# Patient Record
Sex: Female | Born: 1950 | Race: White | Hispanic: No | Marital: Married | Smoking: Never smoker
Health system: Southern US, Community
[De-identification: ages and names within clinical notes are randomized; demographics above are authoritative.]

## PROBLEM LIST (undated history)

## (undated) DIAGNOSIS — F0390 Unspecified dementia without behavioral disturbance: Secondary | ICD-10-CM

## (undated) DIAGNOSIS — I10 Essential (primary) hypertension: Secondary | ICD-10-CM

## (undated) DIAGNOSIS — F32A Depression, unspecified: Secondary | ICD-10-CM

## (undated) DIAGNOSIS — E079 Disorder of thyroid, unspecified: Secondary | ICD-10-CM

## (undated) DIAGNOSIS — F329 Major depressive disorder, single episode, unspecified: Secondary | ICD-10-CM

## (undated) DIAGNOSIS — E119 Type 2 diabetes mellitus without complications: Secondary | ICD-10-CM

## (undated) DIAGNOSIS — E559 Vitamin D deficiency, unspecified: Secondary | ICD-10-CM

## (undated) HISTORY — DX: Type 2 diabetes mellitus without complications: E11.9

## (undated) HISTORY — DX: Vitamin D deficiency, unspecified: E55.9

## (undated) HISTORY — DX: Disorder of thyroid, unspecified: E07.9

---

## 1997-12-11 ENCOUNTER — Ambulatory Visit (HOSPITAL_COMMUNITY): Admission: RE | Admit: 1997-12-11 | Discharge: 1997-12-11 | Payer: Self-pay | Admitting: Obstetrics & Gynecology

## 1999-01-28 ENCOUNTER — Ambulatory Visit (HOSPITAL_COMMUNITY): Admission: RE | Admit: 1999-01-28 | Discharge: 1999-01-28 | Payer: Self-pay | Admitting: Obstetrics & Gynecology

## 1999-01-28 ENCOUNTER — Encounter: Payer: Self-pay | Admitting: Obstetrics & Gynecology

## 1999-03-18 ENCOUNTER — Other Ambulatory Visit: Admission: RE | Admit: 1999-03-18 | Discharge: 1999-03-18 | Payer: Self-pay | Admitting: Obstetrics & Gynecology

## 1999-06-17 ENCOUNTER — Ambulatory Visit (HOSPITAL_COMMUNITY): Admission: RE | Admit: 1999-06-17 | Discharge: 1999-06-17 | Payer: Self-pay | Admitting: Obstetrics & Gynecology

## 1999-12-03 ENCOUNTER — Encounter: Payer: Self-pay | Admitting: Obstetrics & Gynecology

## 1999-12-03 ENCOUNTER — Encounter: Admission: RE | Admit: 1999-12-03 | Discharge: 1999-12-03 | Payer: Self-pay | Admitting: Obstetrics & Gynecology

## 1999-12-20 ENCOUNTER — Other Ambulatory Visit: Admission: RE | Admit: 1999-12-20 | Discharge: 1999-12-20 | Payer: Self-pay | Admitting: Obstetrics & Gynecology

## 2000-05-27 ENCOUNTER — Emergency Department (HOSPITAL_COMMUNITY): Admission: EM | Admit: 2000-05-27 | Discharge: 2000-05-27 | Payer: Self-pay | Admitting: Emergency Medicine

## 2000-05-27 ENCOUNTER — Encounter: Payer: Self-pay | Admitting: Emergency Medicine

## 2000-12-04 ENCOUNTER — Ambulatory Visit (HOSPITAL_COMMUNITY): Admission: RE | Admit: 2000-12-04 | Discharge: 2000-12-04 | Payer: Self-pay | Admitting: Obstetrics & Gynecology

## 2000-12-04 ENCOUNTER — Encounter: Payer: Self-pay | Admitting: Obstetrics & Gynecology

## 2000-12-08 ENCOUNTER — Encounter: Admission: RE | Admit: 2000-12-08 | Discharge: 2000-12-08 | Payer: Self-pay | Admitting: Obstetrics & Gynecology

## 2000-12-08 ENCOUNTER — Encounter: Payer: Self-pay | Admitting: Obstetrics & Gynecology

## 2001-01-23 ENCOUNTER — Other Ambulatory Visit: Admission: RE | Admit: 2001-01-23 | Discharge: 2001-01-23 | Payer: Self-pay | Admitting: Obstetrics & Gynecology

## 2001-03-24 ENCOUNTER — Emergency Department (HOSPITAL_COMMUNITY): Admission: EM | Admit: 2001-03-24 | Discharge: 2001-03-24 | Payer: Self-pay | Admitting: Emergency Medicine

## 2001-03-24 ENCOUNTER — Encounter: Payer: Self-pay | Admitting: Emergency Medicine

## 2002-10-08 ENCOUNTER — Other Ambulatory Visit: Admission: RE | Admit: 2002-10-08 | Discharge: 2002-10-08 | Payer: Self-pay | Admitting: Obstetrics & Gynecology

## 2003-01-20 ENCOUNTER — Ambulatory Visit (HOSPITAL_COMMUNITY): Admission: RE | Admit: 2003-01-20 | Discharge: 2003-01-20 | Payer: Self-pay | Admitting: Obstetrics & Gynecology

## 2003-01-20 ENCOUNTER — Encounter (INDEPENDENT_AMBULATORY_CARE_PROVIDER_SITE_OTHER): Payer: Self-pay | Admitting: Specialist

## 2003-08-14 ENCOUNTER — Ambulatory Visit (HOSPITAL_COMMUNITY): Admission: RE | Admit: 2003-08-14 | Discharge: 2003-08-14 | Payer: Self-pay | Admitting: Internal Medicine

## 2004-01-20 ENCOUNTER — Other Ambulatory Visit: Admission: RE | Admit: 2004-01-20 | Discharge: 2004-01-20 | Payer: Self-pay | Admitting: Obstetrics & Gynecology

## 2004-03-28 ENCOUNTER — Emergency Department (HOSPITAL_COMMUNITY): Admission: EM | Admit: 2004-03-28 | Discharge: 2004-03-28 | Payer: Self-pay | Admitting: Emergency Medicine

## 2004-09-07 ENCOUNTER — Encounter: Payer: Self-pay | Admitting: Gastroenterology

## 2004-12-15 ENCOUNTER — Ambulatory Visit (HOSPITAL_BASED_OUTPATIENT_CLINIC_OR_DEPARTMENT_OTHER): Admission: RE | Admit: 2004-12-15 | Discharge: 2004-12-15 | Payer: Self-pay

## 2004-12-15 ENCOUNTER — Ambulatory Visit (HOSPITAL_COMMUNITY): Admission: RE | Admit: 2004-12-15 | Discharge: 2004-12-15 | Payer: Self-pay

## 2005-05-03 ENCOUNTER — Other Ambulatory Visit: Admission: RE | Admit: 2005-05-03 | Discharge: 2005-05-03 | Payer: Self-pay | Admitting: Obstetrics & Gynecology

## 2006-03-10 ENCOUNTER — Ambulatory Visit: Payer: Self-pay | Admitting: Pulmonary Disease

## 2006-03-13 ENCOUNTER — Ambulatory Visit: Admission: RE | Admit: 2006-03-13 | Discharge: 2006-03-13 | Payer: Self-pay | Admitting: Pulmonary Disease

## 2006-03-13 ENCOUNTER — Encounter: Payer: Self-pay | Admitting: Pulmonary Disease

## 2006-03-21 ENCOUNTER — Ambulatory Visit: Payer: Self-pay | Admitting: Pulmonary Disease

## 2006-04-06 ENCOUNTER — Encounter: Payer: Self-pay | Admitting: Pulmonary Disease

## 2006-04-06 ENCOUNTER — Ambulatory Visit (HOSPITAL_BASED_OUTPATIENT_CLINIC_OR_DEPARTMENT_OTHER): Admission: RE | Admit: 2006-04-06 | Discharge: 2006-04-06 | Payer: Self-pay | Admitting: Pulmonary Disease

## 2006-04-19 ENCOUNTER — Ambulatory Visit: Payer: Self-pay | Admitting: Pulmonary Disease

## 2006-05-02 ENCOUNTER — Ambulatory Visit: Payer: Self-pay | Admitting: Pulmonary Disease

## 2006-10-02 ENCOUNTER — Ambulatory Visit: Payer: Self-pay

## 2007-01-09 ENCOUNTER — Encounter: Payer: Self-pay | Admitting: Gastroenterology

## 2007-06-28 HISTORY — PX: FRACTURE SURGERY: SHX138

## 2007-07-28 ENCOUNTER — Encounter: Payer: Self-pay | Admitting: Pulmonary Disease

## 2007-07-28 DIAGNOSIS — E785 Hyperlipidemia, unspecified: Secondary | ICD-10-CM

## 2007-07-28 DIAGNOSIS — E1169 Type 2 diabetes mellitus with other specified complication: Secondary | ICD-10-CM

## 2007-07-28 DIAGNOSIS — J309 Allergic rhinitis, unspecified: Secondary | ICD-10-CM

## 2007-08-03 ENCOUNTER — Ambulatory Visit: Payer: Self-pay | Admitting: Pulmonary Disease

## 2008-02-23 ENCOUNTER — Emergency Department (HOSPITAL_COMMUNITY): Admission: EM | Admit: 2008-02-23 | Discharge: 2008-02-23 | Payer: Self-pay | Admitting: Emergency Medicine

## 2008-04-14 ENCOUNTER — Encounter: Admission: RE | Admit: 2008-04-14 | Discharge: 2008-04-28 | Payer: Self-pay | Admitting: Internal Medicine

## 2008-04-29 ENCOUNTER — Encounter: Admission: RE | Admit: 2008-04-29 | Discharge: 2008-05-28 | Payer: Self-pay | Admitting: Internal Medicine

## 2008-09-30 ENCOUNTER — Telehealth (INDEPENDENT_AMBULATORY_CARE_PROVIDER_SITE_OTHER): Payer: Self-pay | Admitting: *Deleted

## 2009-02-12 ENCOUNTER — Encounter: Payer: Self-pay | Admitting: Gastroenterology

## 2009-09-12 ENCOUNTER — Emergency Department (HOSPITAL_COMMUNITY): Admission: EM | Admit: 2009-09-12 | Discharge: 2009-09-13 | Payer: Self-pay | Admitting: Emergency Medicine

## 2009-10-28 ENCOUNTER — Ambulatory Visit (HOSPITAL_COMMUNITY): Admission: RE | Admit: 2009-10-28 | Discharge: 2009-10-28 | Payer: Self-pay | Admitting: Internal Medicine

## 2009-12-23 ENCOUNTER — Ambulatory Visit: Payer: Self-pay | Admitting: Psychology

## 2010-11-12 NOTE — Assessment & Plan Note (Signed)
[facility]                               PULMONARY OFFICE NOTE   NAME:[name], Marie [name]                     MRN:          [id number]  DATE:03/10/2006                            DOB:          [birth year]    PULMONARY SELF REFERRAL   HISTORY OF PRESENT ILLNESS:  The patient is a 60 year old, white female, who  comes in today for evaluation of persistent pulmonary complaints.  The  patient states that she has been wheezing in her throat for quite some  time and has had a dry, hacky cough.  It has gotten to the point that she  will choke at times.  The patient denies any significant postnasal drip but  does admit to gastroesophageal reflux symptoms despite being on Prilosec.  She also feels that her food occasionally sticks.  The patient does have  some degree of dyspnea on exertion but it requires a fairly heavy level of  activity.  She admits to dyspnea on exertion at 4 to 5 blocks at a moderate  pace on flat ground.  She has no difficulties making her bed, vacuuming her  house, or bringing groceries in from the car.  The patient has never smoked  and has no history of childhood asthma.  Her last chest x-ray was  approximately 2 years ago.  She does note some mild lower extremity edema.   PAST MEDICAL HISTORY:  Significant for  1. Hypertension.  2. History of diabetes.  3. History of dyslipidemia.  4. History of allergic rhinitis.   CURRENT MEDICATIONS:  Include  1. Cymbalta 60 mg q. day.  2. Metformin ER 500 mg q. day.  3. Atenolol 100 mg q. day.  4. Crestor 40 mg q. day.  5. Seroquel 100 mg q. day.  6. Lorazepam 2 mg q. day.  7. Zetia 10 mg q. day.  8. Glyburide 5 mg q. day.  9. Cyclobenzaprine 10 mg q. day.  10.Prilosec 40 mg q. day.   ALLERGIES:  The patient is allergic to PENICILLIN.   SOCIAL HISTORY:  She is married and has children.  She has never smoked.  She works as a Surveyor, mining.   FAMILY HISTORY:  Remarkable for  asthma, heart disease, and cancer.   REVIEW OF SYSTEMS:  As per history of present illness.  Also see patient  intake form documented on the chart.   PHYSICAL EXAMINATION:  GENERAL:  She is an obese, white female in no acute  distress.  VITAL SIGNS:  Blood pressure is 120/84.  Pulse 99.  Temperature is 97.8.  Weight is 201 pounds.  Oxygen saturation on room air is 96%.  HEENT:  Pupils equal round and reactive to light and accommodation.  Extraocular muscles are intact.  Nares are patent without discharge.  Oropharynx is clear.  NECK:  Supple without JVD or lymphadenopathy.  There is no palpable  thyromegaly.  CHEST:  Totally clear.  CARDIAC:  Regular rate and rhythm.  No murmurs, rubs, or gallops.  ABDOMEN:  Soft, nontender with good bowel sounds.  GENITALIA:  Exam not done, not  indicated.  RECTAL:  Exam not done, not indicated.  BREASTS:  Exam not done, not indicated.  EXTREMITIES:  Lower extremities show trace edema.  Pulses are intact  distally.  NEURO:  Alert and oriented with no obvious motor deficits.   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  There is nothing to suggest  this is from lower airway etiology.  The patient is complaining of some  degree of dysphagia and is having reflux despite being on Prilosec.  At this  point in time,  I would like to do pulmonary function studies to make sure  that she does not have reactive airways disease, but, again, I suspect this  is more upper airway in origin.  We will also increase her proton-pump  inhibitor to see if this will help with her symptomatology.   PLAN:  1. Schedule for full PFTs and chest x-ray.  2. Increase Prilosec to b.i.d. dosing.  3. The patient may need GI evaluation at some point in time if this      continues to be an issue for her.  4. I will call the patient after her PFTs have been done.                                      Barbaraann Share, MD,FCCP   K[facility]/MedQ  DD:  03/14/2006  DT:  03/14/2006  Job #:  [id number]   cc:   Lucky Cowboy, M.D.

## 2010-11-12 NOTE — Op Note (Signed)
NAMEKATHREEN, DILEO NO.:  [id number]   MEDICAL RECORD NO.:  [id number]          PATIENT TYPE:  AMB   LOCATION:  [facility]                         FACILITY:  [facility]   PHYSICIAN:  Lorre Munroe., M.D.DATE OF BIRTH:  1950/08/21   DATE OF PROCEDURE:  12/15/2004  DATE OF DISCHARGE:                                 OPERATIVE REPORT   PREOPERATIVE DIAGNOSES:  Umbilical hernia   POSTOPERATIVE DIAGNOSES:  Umbilical hernia.   OPERATION:  Repair of umbilical hernia.   SURGEON:  Lebron Conners, M.D.   ANESTHESIA:  General and local.   DESCRIPTION OF PROCEDURE:  After the patient was monitored and had general  anesthesia and routine preparation and draping of the abdomen, I made a  short slightly downward curved transverse incision just above the umbilicus.  I immediately encountered bulging fat which separated from the normal  subcutaneous tissues. It was densely adherent to the umbilical skin and I  sharply dissected it away. I dissected the hernia down to the defect which  was about 1.5 cm in circumference and was a typical umbilical hernia. I  separated all of the hernia from the edges of the defect and reduced the  hernia and then sutured the defect closed with 2-0 Prolene suture. I  mobilized the umbilicus and subcutaneous tissues off the fascia for at least  2 cm beyond the repair in all directions. I then fashioned a piece of  polypropylene mesh to overlay the repair and I sewed that in with running  basting 2-0 Prolene suture. I felt that the repair was secure. The sponge,  needle and instrument counts were correct. I anesthetized the tissues with  long-acting local anesthetic. I sewed the umbilical skin down to the center  of the mesh with 4-0 Vicryl and then reduced the subcutaneous space with  some interrupted sutures of 4-0 Vicryl and closed the skin with interrupted  buried intracuticular 4-0 Vicryl and Steri-Strips. I applied a bulky  bandage. She tolerated  the operation well.       WB/MEDQ  D:  12/15/2004  T:  12/15/2004  Job:  [id number]   cc:   Lucky Cowboy, M.D.  [address], [address]  [place], [place] [address]  Fax: [phone]

## 2010-11-12 NOTE — Op Note (Signed)
NAME:  Marie [name], Marie [name]                        ACCOUNT NO.:  [id number]   MEDICAL RECORD NO.:  [id number]                   PATIENT TYPE:  AMB   LOCATION:  [facility]                                  FACILITY:  [facility]   PHYSICIAN:  Freddy Finner, M.D.                DATE OF BIRTH:  05/26/1951   DATE OF PROCEDURE:  01/20/2003  DATE OF DISCHARGE:                                 OPERATIVE REPORT   PREOPERATIVE DIAGNOSES:  1. Probable endometrial polyp.  2. 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   POSTOPERATIVE DIAGNOSES:  1. Probable endometrial polyp.  2. 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   OPERATIVE PROCEDURES:  1. Hysteroscopy.  2. Dilatation and curettage.   ANESTHESIA:  General.   ESTIMATED INTRAOPERATIVE BLOOD LOSS:  10 mL.   INTRAOPERATIVE SORBITOL DEFICIT:  20 mL.   INTRAOPERATIVE COMPLICATIONS:  None.   INDICATIONS:  The patient was seen in the office recently for an episode of  postmenopausal bleeding.  Subsequent to that examination, she had a  sonohysterogram with pelvic ultrasound.  There was a 7 mm lesion  submucosally consistent with a degenerating myoma and a small defect filling  the endometrial cavity.  She is admitted now for hysteroscopy and D&C.  Also  noted on the initial ultrasound and sonohysterogram was a complex cystic  left adnexal mass with partial clearing and followup ultrasound  approximately one week prior to surgery.  This will not be surgically  treated at this time.   DESCRIPTION OF PROCEDURE:  The patient was admitted on the morning of  surgery, brought to the operating room, placed under adequate general  anesthesia, and placed in the dorsolithotomy position using the Kendrick  stirrup system.  Betadine prep was carried out in the usual fashion.  Sterile drapes were applied.  A bivalve speculum and later a posterior wedge  retractor was used to visualize the cervix, which was grasped with a single-  tooth tenaculum and progressively dilated to 23 with Pratt dilators.  The  uterus sounded to 8.5 cm.  The 12.5 degree ACMI hysteroscope was introduced  using 3% Sorbitol distending median and survey of the endometrial cavity  carried out.  There was one small lesion consistent with a small polyp.  The  endometrium appeared to be somewhat heaped up and thickened.  Gentle  thorough curettage was carried out followed by exploration with [name]  stone forceps.  Repeat inspection of the endometrial cavity revealed  clearing of the polyp and adequate sampling of the endometrium.  A blue  somewhat clear cystic lesion was noted just beneath the epithelium on the  lower uterine segment posteriorly.  A photograph was made of this, but was  not further tested.  At this point, the procedure was terminated and the  instruments were removed.  The patient was awaken and taken to recovery in  good condition.  She will be  discharged in the immediate postoperative period for followup in  the office in approximately one week.  She was given routine postoperative  surgical instructions.  She is to use ibuprofen or Tylenol as needed for  pain or Darvocet for more severe postoperative pain.                                               Freddy Finner, M.D.    WRN/MEDQ  D:  01/20/2003  T:  01/20/2003  Job:  [id number]

## 2010-11-12 NOTE — Assessment & Plan Note (Signed)
[facility]                               PULMONARY OFFICE NOTE   NAME:Marie [name], Marie [name]                     MRN:          [id number]  DATE:05/02/2006                            DOB:          1951/05/01    REASON FOR VISIT:  Marie [name] comes in today for followup of her chronic  cough secondary to laryngopharyngeal reflux and also to followup on her  sleep study. Her cough has been doing very well on b.i.d. Prilosec and is  nearly totally resolved.  The patient also had recent nocturnal  polysomnogram and was found to have very minimal obstructive sleep apnea  with respiratory distress and evidence of seven events per hour and oxygen  saturation transiently to 85%.  She was also noted to have 113 leg jerks  with 5 per hour resulting in arousal or awakening.  The patient denies any  symptoms consistent with restless leg syndrome, but does admit to having  knee issues with some chronic pain that probably contributes to her leg  jerks.   PHYSICAL EXAMINATION:  VITAL SIGNS:  Blood pressure 110/68, pulse 94,  temperature 97.7, weight 202 pounds.  Room air saturation is 95%.   IMPRESSION:  1. Cough secondary to laryngopharyngeal reflux.  The patient is much      improved on b.i.d. Prilosec.  I really think she needs to continue on      this and work aggressively on weight loss so that her reflux will get      better and she will be able to come down to one time a day or      completely off the medication.  2. Very minimal obstructive sleep apnea which should respond quite well to      weight loss.   PLAN:  1. Work on weight loss.  2. Stay on the b.i.d. Prilosec until her weight decreases.  3. The patient is to call if her leg jerks become more of an issue or if      she feels like she is unable to lose weight and her sleep continues to      suffer.  4. The patient will followup with me on a p.r.n. basis.    ______________________________  Barbaraann Share, MD,FCCP    [facility]/MedQ  DD: 05/02/2006  DT: 05/02/2006  Job #: [id number]   cc:   Lucky Cowboy, M.D.

## 2010-11-12 NOTE — Procedures (Signed)
NAMESELBY, SLOVACEK NO.:  [id number]   MEDICAL RECORD NO.:  [id number]          PATIENT TYPE:  OUT   LOCATION:  [facility] CENTER                 FACILITY:  [facility]   PHYSICIAN:  Barbaraann Share, MD,FCCPDATE OF BIRTH:  [birth year]   DATE OF STUDY:  04/06/2006                              NOCTURNAL POLYSOMNOGRAM    REFERRING PHYSICIAN:  Dr. Marcelyn Bruins   INDICATION FOR STUDY:  Hypersomnolence, sleep apnea.   EPWORTH SLEEPINESS SCORE:  5   SLEEP ARCHITECTURE:  The patient had a total sleep time of 365 minutes with  decreased REM and slow-wave sleep.  Sleep onset latency was normal and REM  onset was prolonged.  Sleep efficiency was decreased at 83%.   RESPIRATORY DATA:  The patient was found to have 31 hypopneas and 14  obstructive apneas for a respiratory disturbance index of 7 events per hour.  The events were not positional and his O2 desaturation was as low as 85%.  There was moderate snoring noted throughout the study.   OXYGEN DATA:  The patient had O2 desaturation as low as 85% with the  obstructive events, as stated above.   CARDIAC DATA:  No clinically significant arrhythmia was noted.   MOVEMENT-PARASOMNIA:  The patient was found to have 113 leg jerks with 5 per  hour, resulting in arousal or awakening.   IMPRESSIONS-RECOMMENDATIONS:  1. Very mild obstructive sleep apnea/hypopnea syndrome with a respiratory      disturbance index of 7 events per hour and O2 desaturation as low as      85%.  Treatment for this dubious sleep apnea can include weight loss      alone if applicable, upper airway surgery, oral appliance, and also      CPAP.  2. Large numbers of leg jerks with what appears to be significant sleep      disruption.  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  Clinical      correlation is suggested.           ______________________________  Barbaraann Share,  MD,FCCP  Diplomate, American Board of Sleep  Medicine    [facility]/MEDQ  D:  04/20/2006 15:49:13  T:  04/21/2006 19:12:52  Job:  [id number]

## 2010-11-12 NOTE — Consult Note (Signed)
[facility]  Patient:    Marie [name], Marie [name] Visit Number: [id number] MRN: [id number]          Service Type: Attending:  Earlyne Iba, M.D. Dictated by:   Georgena Spurling, M.D. Proc. Date: 03/24/01 Consultation Date: 03/24/01                            Consultation Report  ER CONSULT  ADMISSION DIAGNOSES:  Right humerus fracture.  CONSULTING PHYSICIAN:  Earlyne Iba, M.D.  HISTORY OF PRESENT ILLNESS:  The patient is a 60 year old white female who fell off a horse approximately four hours ago.  She complained of right upper extremity pain, and was brought to the emergency room by her family and friends.  X-rays reveal a humerus fracture and I was consulted.  ALLERGIES:  PENICILLIN.  No other medications.  REVIEW OF SYSTEMS:  Otherwise negative.  PHYSICAL EXAMINATION:  VITAL SIGNS:  She is afebrile.  Vital signs are stable.  GENERAL:  Well-nourished, well-developed, in no distress.  She is somewhat sedated from morphine, given to her upon arrival.  SKIN:  Intact.  NEUROVASCULAR:  Grossly intact.  Her radial nerve does appear to be intact. She has excellent pulses.  X-RAYS:  Show approximately mid-shaft humerus fracture, with possibly 60 degrees of angulation in the anteroposterior plane; virtually none in the mediolateral plane.  IMPRESSION:  Humeral shaft fracture, closed.  TREATMENT:  Closed reduction in the emergency room, and coaptation splint application.  She was given prescription for Percocet, instructions for ice and elevation.  Follow-up in four days, at which time we will move to a SOMI ______ brace. Dictated by:   Georgena Spurling, M.D. Attending:  Earlyne Iba, M.D. DD:  03/24/01 TD:  03/24/01 Job: [id number]

## 2010-12-03 ENCOUNTER — Other Ambulatory Visit (HOSPITAL_COMMUNITY): Payer: Self-pay | Admitting: Orthopedic Surgery

## 2010-12-03 ENCOUNTER — Other Ambulatory Visit: Payer: Self-pay | Admitting: Orthopedic Surgery

## 2010-12-03 ENCOUNTER — Encounter (HOSPITAL_COMMUNITY): Payer: BC Managed Care – PPO

## 2010-12-03 ENCOUNTER — Ambulatory Visit (HOSPITAL_COMMUNITY)
Admission: RE | Admit: 2010-12-03 | Discharge: 2010-12-03 | Disposition: A | Discharge status: Home / Self Care | Payer: BC Managed Care – PPO | Source: Ambulatory Visit | Attending: Orthopedic Surgery | Admitting: Orthopedic Surgery

## 2010-12-03 DIAGNOSIS — Z01811 Encounter for preprocedural respiratory examination: Secondary | ICD-10-CM

## 2010-12-03 DIAGNOSIS — Z01812 Encounter for preprocedural laboratory examination: Secondary | ICD-10-CM | POA: Insufficient documentation

## 2010-12-03 DIAGNOSIS — Z01818 Encounter for other preprocedural examination: Secondary | ICD-10-CM | POA: Insufficient documentation

## 2010-12-03 LAB — URINALYSIS, ROUTINE W REFLEX MICROSCOPIC
Bilirubin Urine: NEGATIVE
Glucose, UA: NEGATIVE mg/dL
Hgb urine dipstick: NEGATIVE
Ketones, ur: NEGATIVE mg/dL
Nitrite: NEGATIVE
Protein, ur: NEGATIVE mg/dL
Specific Gravity, Urine: 1.02 (ref 1.005–1.030)
Urobilinogen, UA: 1 mg/dL (ref 0.0–1.0)
pH: 6.5 (ref 5.0–8.0)

## 2010-12-03 LAB — URINE MICROSCOPIC-ADD ON

## 2010-12-03 LAB — COMPREHENSIVE METABOLIC PANEL
ALT: 18 U/L (ref 0–35)
AST: 23 U/L (ref 0–37)
Albumin: 3.9 g/dL (ref 3.5–5.2)
Alkaline Phosphatase: 51 U/L (ref 39–117)
BUN: 7 mg/dL (ref 6–23)
CO2: 28 mEq/L (ref 19–32)
Calcium: 9.4 mg/dL (ref 8.4–10.5)
Chloride: 99 mEq/L (ref 96–112)
Creatinine, Ser: 0.82 mg/dL (ref 0.4–1.2)
GFR calc Af Amer: >60 mL/min (ref >60)
GFR calc non Af Amer: >60 mL/min (ref >60)
Glucose, Bld: 84 mg/dL (ref 70–99)
Potassium: 3.6 mEq/L (ref 3.5–5.1)
Sodium: 135 mEq/L (ref 135–145)
Total Bilirubin: 0.3 mg/dL (ref 0.3–1.2)
Total Protein: 6.5 g/dL (ref 6.0–8.3)

## 2010-12-03 LAB — DIFFERENTIAL
Basophils Absolute: 0.1 10*3/uL (ref 0.0–0.1)
Basophils Relative: 1 % (ref 0–1)
Eosinophils Absolute: 0.2 10*3/uL (ref 0.0–0.7)
Eosinophils Relative: 3 % (ref 0–5)
Lymphocytes Relative: 26 % (ref 12–46)
Lymphs Abs: 1.6 10*3/uL (ref 0.7–4.0)
Monocytes Absolute: 0.5 10*3/uL (ref 0.1–1.0)
Monocytes Relative: 8 % (ref 3–12)
Neutro Abs: 3.7 10*3/uL (ref 1.7–7.7)
Neutrophils Relative %: 62 % (ref 43–77)

## 2010-12-03 LAB — CBC
HCT: 40.4 % (ref 36.0–46.0)
Hemoglobin: 13.3 g/dL (ref 12.0–15.0)
MCH: 29.4 pg (ref 26.0–34.0)
MCHC: 32.9 g/dL (ref 30.0–36.0)
MCV: 89.2 fL (ref 78.0–100.0)
Platelets: 304 10*3/uL (ref 150–400)
RBC: 4.53 MIL/uL (ref 3.87–5.11)
RDW: 12.9 % (ref 11.5–15.5)
WBC: 5.9 10*3/uL (ref 4.0–10.5)

## 2010-12-03 LAB — SURGICAL PCR SCREEN
MRSA, PCR: NEGATIVE
Staphylococcus aureus: NEGATIVE

## 2010-12-03 LAB — PROTIME-INR
INR: 0.93 (ref 0.00–1.49)
Prothrombin Time: 12.7 seconds (ref 11.6–15.2)

## 2010-12-03 LAB — APTT: aPTT: 30 seconds (ref 24–37)

## 2010-12-03 LAB — ABO/RH: ABO/RH(D): B POS

## 2010-12-09 DIAGNOSIS — E119 Type 2 diabetes mellitus without complications: Secondary | ICD-10-CM | POA: Diagnosis present

## 2010-12-09 DIAGNOSIS — Z01812 Encounter for preprocedural laboratory examination: Secondary | ICD-10-CM

## 2010-12-09 DIAGNOSIS — I10 Essential (primary) hypertension: Secondary | ICD-10-CM | POA: Diagnosis present

## 2010-12-09 DIAGNOSIS — M1710 Unilateral primary osteoarthritis, unspecified knee: Secondary | ICD-10-CM | POA: Diagnosis present

## 2010-12-09 DIAGNOSIS — Z96659 Presence of unspecified artificial knee joint: Secondary | ICD-10-CM

## 2010-12-09 DIAGNOSIS — T84099A Other mechanical complication of unspecified internal joint prosthesis, initial encounter: Principal | ICD-10-CM | POA: Diagnosis present

## 2010-12-09 DIAGNOSIS — D62 Acute posthemorrhagic anemia: Secondary | ICD-10-CM | POA: Diagnosis not present

## 2010-12-09 DIAGNOSIS — Y831 Surgical operation with implant of artificial internal device as the cause of abnormal reaction of the patient, or of later complication, without mention of misadventure at the time of the procedure: Secondary | ICD-10-CM | POA: Diagnosis present

## 2010-12-09 LAB — GLUCOSE, CAPILLARY
Glucose-Capillary: 131 mg/dL — ABNORMAL HIGH (ref 70–99)
Glucose-Capillary: 177 mg/dL — ABNORMAL HIGH (ref 70–99)
Glucose-Capillary: 162 mg/dL — ABNORMAL HIGH (ref 70–99)
Glucose-Capillary: 78 mg/dL (ref 70–99)

## 2010-12-09 LAB — HEMOGLOBIN A1C
Hgb A1c MFr Bld: 6 % — ABNORMAL HIGH (ref <5.7)
Mean Plasma Glucose: 126 mg/dL — ABNORMAL HIGH (ref <117)

## 2010-12-10 LAB — CBC
HCT: 30.3 % — ABNORMAL LOW (ref 36.0–46.0)
Hemoglobin: 10.3 g/dL — ABNORMAL LOW (ref 12.0–15.0)
MCH: 30.1 pg (ref 26.0–34.0)
MCHC: 34 g/dL (ref 30.0–36.0)
MCV: 88.6 fL (ref 78.0–100.0)
Platelets: 220 10*3/uL (ref 150–400)
RBC: 3.42 MIL/uL — ABNORMAL LOW (ref 3.87–5.11)
RDW: 13.1 % (ref 11.5–15.5)
WBC: 7.1 10*3/uL (ref 4.0–10.5)

## 2010-12-10 LAB — GLUCOSE, CAPILLARY
Glucose-Capillary: 139 mg/dL — ABNORMAL HIGH (ref 70–99)
Glucose-Capillary: 105 mg/dL — ABNORMAL HIGH (ref 70–99)
Glucose-Capillary: 121 mg/dL — ABNORMAL HIGH (ref 70–99)

## 2010-12-10 LAB — BASIC METABOLIC PANEL
BUN: 9 mg/dL (ref 6–23)
CO2: 28 mEq/L (ref 19–32)
Calcium: 8.3 mg/dL — ABNORMAL LOW (ref 8.4–10.5)
Chloride: 101 mEq/L (ref 96–112)
Creatinine, Ser: 0.82 mg/dL (ref 0.50–1.10)
GFR calc Af Amer: >60 mL/min (ref >60)
GFR calc non Af Amer: >60 mL/min (ref >60)
Glucose, Bld: 123 mg/dL — ABNORMAL HIGH (ref 70–99)
Potassium: 3.5 mEq/L (ref 3.5–5.1)
Sodium: 136 mEq/L (ref 135–145)

## 2010-12-11 LAB — BASIC METABOLIC PANEL
BUN: 9 mg/dL (ref 6–23)
Calcium: 8.8 mg/dL (ref 8.4–10.5)
Creatinine, Ser: 0.84 mg/dL (ref 0.50–1.10)
GFR calc Af Amer: >60 mL/min (ref >60)
GFR calc non Af Amer: >60 mL/min (ref >60)
Glucose, Bld: 111 mg/dL — ABNORMAL HIGH (ref 70–99)
Potassium: 3.9 mEq/L (ref 3.5–5.1)

## 2010-12-11 LAB — CBC
HCT: 28.3 % — ABNORMAL LOW (ref 36.0–46.0)
Hemoglobin: 9.5 g/dL — ABNORMAL LOW (ref 12.0–15.0)
MCH: 29.9 pg (ref 26.0–34.0)
MCHC: 33.6 g/dL (ref 30.0–36.0)
MCV: 89 fL (ref 78.0–100.0)
RDW: 13.2 % (ref 11.5–15.5)

## 2010-12-11 LAB — GLUCOSE, CAPILLARY: Glucose-Capillary: 134 mg/dL — ABNORMAL HIGH (ref 70–99)

## 2010-12-12 LAB — GLUCOSE, CAPILLARY

## 2010-12-12 LAB — CBC
HCT: 30.1 % — ABNORMAL LOW (ref 36.0–46.0)
RBC: 3.39 MIL/uL — ABNORMAL LOW (ref 3.87–5.11)
RDW: 13.1 % (ref 11.5–15.5)
WBC: 6.2 10*3/uL (ref 4.0–10.5)

## 2010-12-13 LAB — CROSSMATCH
ABO/RH(D): B POS
Antibody Screen: NEGATIVE
Unit division: 0

## 2011-01-12 NOTE — Discharge Summary (Signed)
NAMEDEJAE, BERNET NO.:  [id number]  MEDICAL RECORD NO.:  [id number]  LOCATION:  1619                         FACILITY:  [facility]  PHYSICIAN:  Lithzy Bernard L. Rendall, M.D.  DATE OF BIRTH:  [birth year]  DATE OF ADMISSION:  12/09/2010 DATE OF DISCHARGE:  12/12/2010                              DISCHARGE SUMMARY   ADMISSION DIAGNOSES: 1. Painful left unicompartmental knee arthroplasty. 2. Hypertension. 3. Type 2 diabetes mellitus. 4. Hypercholesterolemia. 5. Anxiety/depression. 6. Hypothyroidism.  DISCHARGE DIAGNOSES: 1. Painful left uni-knee arthroplasty now status post left total knee     arthroplasty. 2. Acute blood loss anemia secondary to surgery. 3. Constipation. 4. Hypertension. 5. Type 2 diabetes mellitus. 6. Hypercholesterolemia. 7. Anxiety/depression. 8. Hypothyroidism.  SURGICAL PROCEDURES:  On December 09, 2010, Ms. Klett underwent an excision of her left unicompartmental knee and this was subsequently revised to a total knee arthroplasty with computer navigation by Dr. Jonny Ruiz L. Rendall assisted by Arnoldo Morale PA-C.  She had a DePuy primary femoral component cemented size medium left placed with an LCS complete metal back patella cemented size medium.  A tibial tray rotating platform MBT revision size 2.5 cemented and an LCS complete tibial insert rotating platform 17.5-mm thickness medium.  COMPLICATIONS:  None.  CONSULTANT:  Physical Therapy consult on December 10, 2010.  HISTORY OF PRESENT ILLNESS:  This 60 year old white female patient presented to Dr. Priscille Kluver with history of a left unicompartmental knee in July of 1995 and now a several-month history of progressive left knee deformity and pain.  Her left knee pain is an intermittent throb over the anterolateral joint line without radiation.  It increases with increased activity and decreases with rest.  There are no mechanical symptoms of the knee but x-rays show loosening of the prosthesis  and because of the pain and progressive deformity, she is presenting for a left knee revision.  HOSPITAL COURSE:  Ms. Avalos tolerated her surgical procedure well without immediate postoperative complications.  She was transferred to the orthopedic floor.  Postop day #1, she was afebrile, vitals were stable.  Hemoglobin 10.3, hematocrit 30.3, hemoglobin A1c was 6%. Hemovac was DC'ed.  Dressing was intact with minimal drainage.  She was weaned off her oxygen.  PCA and Foley were DC'ed.  She was started on therapy per protocol.  Postop day #2, she complained of continued pain in the knee but remained afebrile.  Labs were stable.  She made slow progress with therapy that day and plans were made for DC home for the next day.  At that time, her pain was well controlled with meds.  She  again remained afebrile.  She was moving well with therapy and it was felt she was ready for DC home.  DISCHARGE INSTRUCTIONS:  DIET:  She is to resume her regular diabetic diet.  MEDICATIONS:  Please see the patient home med discharge instruction sheet for complete documentation of her medications.  We did continue all of her home meds but we did add Celebrex, Robaxin, Norco and Xarelto.  ACTIVITY:  She can be out of bed weightbearing as tolerated on the left leg with use of walker.  No lifting or driving for 6 weeks.  Please see the white total joint discharge sheet for further activity instructions.  WOUND CARE:  Please see the white total joint discharge sheet for further wound care instructions.  FOLLOW-UP:  She needs to follow up with Dr. Priscille Kluver in our office on Thursday December 23, 2010, and needs to call [phone] for that appointment. She is arranged for [facility] and CPM by [employer].  LABORATORY DATA:  Hemoglobin and hematocrit ranged from 10.3 and 30.3 on the 15th to 9.5 and 28.3 on the 16th to 10.2 and 30.1 on the 17th. White count and platelets were within normal  limits.  Glucose ranged from 123 on the 15th to 111 on the 16th.  Calcium ranged from 8.3 on the 15th to 8.8 on the 16th.  Hemoglobin A1c was 6%.  All other laboratory studies were within normal limits.     Legrand Pitts Duffy, P.A.   ______________________________ Carlisle Beers. Priscille Kluver, M.D.    KED/MEDQ  D:  01/05/2011  T:  01/05/2011  Job:  [id number]  Electronically Signed by Arnoldo Morale P.A. on 01/06/2011 09:39:29 AM Electronically Signed by Erasmo Leventhal M.D. on 01/12/2011 09:59:44 AM

## 2011-01-12 NOTE — Op Note (Signed)
NAMEENDIA, MONCUR NO.:  [id number]  MEDICAL RECORD NO.:  [id number]  LOCATION:  0004                         FACILITY:  [facility]  PHYSICIAN:  Larae Caison L. Rendall, M.D.  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  SURGICAL PROCEDURES:  Removal of unicompartmental total knee and revision total knee with computer navigation assistance.  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  SURGEON:  Lyndal Reggio L. Rendall, M.D.  ASSISTANT:  Legrand Pitts. Duffy, P.A. present and participating in entire procedure.  PATHOLOGY:  The patient had a unicompartmental total knee put in 1995. She has a lateral parapatellar incision and it has become symptomatically loose in the last 1 to 2 years.  Radiographically it looks like the tibia may be loose but at the time of surgery femoral component wiggled when tapped.  The tibial component was more solid but displaced readily with pressure at the anterior inferior edge.  PROCEDURE:  Under general anesthesia with femoral nerve block, the left leg was prepared with DuraPrep and draped as a sterile field with a sterile tourniquet proximally.  Leg was elevated and then tourniquet used at 350 mmHg.  The lateral parapatellar incision was carefully marked.  It is totally usable for this prosthesis today and the decision was made to go approximately 2 inches medial to this 59 year old incision with absolutely no undermining of the subcutaneous tissue and skin.  With that in mind, a medial parapatellar incision was made at least 2 inches medial to the old incision.  The patella was everted. There was foreign body reaction to the failed total knee and a synovectomy was carried out.  The knee was then flexed.  The femoral component was found to be loose and  was tapped out of the joint with small osteotome.  The front of the tibial tray laterally is exposed and it is tapped out and removed.  At this point debridement is done in preparation for computer mapping.  Schanz pins were then placed in the superior medial tibia and distal femur.  The arrays were set up, femoral head and malleoli are identified.  Proximal tibia and distal femur were then mapped.  Extension was then mapped.  The knee was in 10 degrees flexion and 7 degrees valgus at the start of the procedure.  Once the mapping was complete, the tibial guide was used for proximal tibial resection.  Decision was made to take it 1 mm below the lateral uni and take approximately 10.5 mm off the high side medially.  With this plan in place, proximal tibial resection was carried out.  The balancer was then used and ligament balancing was done within 2 degrees.  It is 2 degrees valgus at this point.  The flexion gap was then measured.  The femur was then sized at a medium.  Anterior and posterior flares of the distal femur were resected.  Once this was  done, the distal femoral cut was made.  Flexion and extension gaps roughly seemed to balance at 12.5. The  lamina spreader was then inserted and remnants of the cruciates and menisci were resected.  Considerable debris was taken out of the lateral recess posteriorly.  Once this was done, the recessing guide was used on the femur and with this completed attention was turned to the tibia. Mchale and Hohmann were used for exposure.  Debris was removed around the lateral a edge.  Once this was completed, center peg hole with keel were inserted for the MBT bearing and tray.  Decision was made to go with the longer stem in the tibia due to this being a revision situation.  Once this was done, trial seating of a 2.5 MBT tibia, 12.5 bearing and medium femur reveals alignment within 3 to 4 degrees of valgus but some hyperextension stepping it up  progressively to a 17.5 bearing seemed to optimize  results with alignment within 2 degrees of anatomic being 2 degrees valgus and the knee would then still flex easily to approximately 120 degrees.  There was still some slight laxity of the knee and flexion laterally but in extension.  It was very solid. This was felt to be all within acceptable range and permanent components were obtained.  Several bony surfaces that were very sclerotic were drilled with fins and several cysts were taken out of the tibia and femur, and once this was all completed, permanent components were cemented in place.  Excellent fit, alignment, and stability were obtained.  Tourniquet was let down at 1 hour and 15 minutes roughly. Multiple small vessels were cauterized after excess cement was removed and a medium Hemovac drain inserted.  The knee was then closed in layers with #1 Tycron, #1 Vicryl, 2-0 Vicryl and skin clips.  The patient returned to recovery in good condition.  The lateral skin edge of the incision did seem to have good bleeding in the subcutaneous fat and the skin appear viable.     Daril Warga L. Priscille Kluver, M.D.     Renato Gails  D:  12/09/2010  T:  12/09/2010  Job:  [id number]  Electronically Signed by Erasmo Leventhal M.D. on 01/12/2011 09:59:38 AM

## 2011-04-18 ENCOUNTER — Other Ambulatory Visit: Payer: Self-pay | Admitting: Internal Medicine

## 2011-04-18 DIAGNOSIS — R413 Other amnesia: Secondary | ICD-10-CM

## 2011-04-21 ENCOUNTER — Ambulatory Visit
Admission: RE | Admit: 2011-04-21 | Discharge: 2011-04-21 | Disposition: A | Discharge status: Home / Self Care | Payer: BC Managed Care – PPO | Source: Ambulatory Visit | Attending: Internal Medicine | Admitting: Internal Medicine

## 2011-04-21 DIAGNOSIS — R413 Other amnesia: Secondary | ICD-10-CM

## 2011-04-21 MED ORDER — GADOBENATE DIMEGLUMINE 529 MG/ML IV SOLN
14.0000 mL | Freq: Once | INTRAVENOUS | Status: AC | PRN
  Administered 2011-04-21: 14 mL via INTRAVENOUS

## 2011-05-09 ENCOUNTER — Ambulatory Visit: Payer: BC Managed Care – PPO

## 2011-11-26 HISTORY — PX: TOTAL KNEE ARTHROPLASTY: SHX125

## 2012-01-26 ENCOUNTER — Encounter (HOSPITAL_COMMUNITY): Payer: Self-pay | Admitting: *Deleted

## 2012-01-26 ENCOUNTER — Emergency Department (HOSPITAL_COMMUNITY): Payer: BC Managed Care – PPO

## 2012-01-26 ENCOUNTER — Emergency Department (HOSPITAL_COMMUNITY)
Admission: EM | Admit: 2012-01-26 | Discharge: 2012-01-26 | Disposition: A | Discharge status: Home / Self Care | Payer: BC Managed Care – PPO | Attending: Emergency Medicine | Admitting: Emergency Medicine

## 2012-01-26 DIAGNOSIS — T50901A Poisoning by unspecified drugs, medicaments and biological substances, accidental (unintentional), initial encounter: Secondary | ICD-10-CM | POA: Insufficient documentation

## 2012-01-26 DIAGNOSIS — I10 Essential (primary) hypertension: Secondary | ICD-10-CM | POA: Insufficient documentation

## 2012-01-26 DIAGNOSIS — R4182 Altered mental status, unspecified: Secondary | ICD-10-CM | POA: Insufficient documentation

## 2012-01-26 DIAGNOSIS — E119 Type 2 diabetes mellitus without complications: Secondary | ICD-10-CM | POA: Insufficient documentation

## 2012-01-26 DIAGNOSIS — Z79899 Other long term (current) drug therapy: Secondary | ICD-10-CM | POA: Insufficient documentation

## 2012-01-26 DIAGNOSIS — R000 Tachycardia, unspecified: Secondary | ICD-10-CM | POA: Insufficient documentation

## 2012-01-26 DIAGNOSIS — R404 Transient alteration of awareness: Secondary | ICD-10-CM | POA: Insufficient documentation

## 2012-01-26 HISTORY — DX: Major depressive disorder, single episode, unspecified: F32.9

## 2012-01-26 HISTORY — DX: Unspecified dementia, unspecified severity, without behavioral disturbance, psychotic disturbance, mood disturbance, and anxiety: F03.90

## 2012-01-26 HISTORY — DX: Depression, unspecified: F32.A

## 2012-01-26 HISTORY — DX: Essential (primary) hypertension: I10

## 2012-01-26 LAB — URINALYSIS, ROUTINE W REFLEX MICROSCOPIC
Glucose, UA: NEGATIVE mg/dL
Hgb urine dipstick: NEGATIVE
Protein, ur: NEGATIVE mg/dL
Specific Gravity, Urine: 1.016 (ref 1.005–1.030)

## 2012-01-26 LAB — CBC
MCH: 30.7 pg (ref 26.0–34.0)
Platelets: 243 10*3/uL (ref 150–400)
RBC: 3.88 MIL/uL (ref 3.87–5.11)
WBC: 6.1 10*3/uL (ref 4.0–10.5)

## 2012-01-26 LAB — COMPREHENSIVE METABOLIC PANEL
ALT: 13 U/L (ref 0–35)
AST: 17 U/L (ref 0–37)
CO2: 23 mEq/L (ref 19–32)
Calcium: 9.2 mg/dL (ref 8.4–10.5)
GFR calc non Af Amer: >90 mL/min (ref >90)
Sodium: 138 mEq/L (ref 135–145)

## 2012-01-26 LAB — GLUCOSE, CAPILLARY: Glucose-Capillary: 132 mg/dL — ABNORMAL HIGH (ref 70–99)

## 2012-01-26 LAB — RAPID URINE DRUG SCREEN, HOSP PERFORMED: Barbiturates: NOT DETECTED

## 2012-01-26 LAB — POCT I-STAT TROPONIN I

## 2012-01-26 MED ORDER — SODIUM CHLORIDE 0.9 % IV BOLUS (SEPSIS)
1000.0000 mL | Freq: Once | INTRAVENOUS | Status: AC
Start: 2012-01-26 — End: 2012-01-26
  Administered 2012-01-26: 1000 mL via INTRAVENOUS

## 2012-01-26 NOTE — ED Notes (Signed)
Per pt's family pt has had decreased loc and drowsy thought the day. Pt's was unable to find her daughters house. Pt is unable to recall family members. Pt will respond to verbal. Family is unsure of pt's loc. Family states they are unsure if she took to much medication. Pt's daughter states she is 8 over the amount of xanax and 4 over more of quetiapine

## 2012-01-26 NOTE — ED Provider Notes (Signed)
History     CSN: [id number]  Arrival date & time 01/26/12  1635   First MD Initiated Contact with Patient 01/26/12 1651      Chief Complaint  Patient presents with  . Altered Mental Status    (Consider location/radiation/quality/duration/timing/severity/associated sxs/prior treatment) HPI Comments: Level 5 caveat for dementia/AMS.  Pt comes in with cc of altered mental status. Called nursing home, and the incoming staff wasn't sure why exactly patient was sent to the ED, and was unable to give a good idea on baseline status. Spoke with family, and the daughter on the phone stated that patient has had change in mentation, mostly somnolence, due to UTI in the past. Pt is aox1, and has no complains. ROS is negative for chest pain, sob, cough, n/v/abd pain/uti like sx.   The history is provided by the patient and the nursing home. The history is limited by the condition of the patient.    Past Medical History  Diagnosis Date  . Hypertension   . Depression   . Diabetes mellitus   . Dementia     History reviewed. No pertinent past surgical history.  No family history on file.  History  Substance Use Topics  . Smoking status: Never Smoker   . Smokeless tobacco: Not on file  . Alcohol Use: No    OB History    Grav Para Term Preterm Abortions TAB SAB Ect Mult Living                  Review of Systems  Constitutional: Positive for activity change.  HENT: Negative for neck pain.   Respiratory: Negative for cough and shortness of breath.   Cardiovascular: Negative for chest pain.  Gastrointestinal: Negative for nausea, vomiting and abdominal pain.  Genitourinary: Negative for dysuria, frequency and hematuria.  Neurological: Negative for headaches.    Allergies  Penicillins  Home Medications   Current Outpatient Rx  Name Route Sig Dispense Refill  . ALPRAZOLAM 1 MG PO TABS Oral Take 1 mg by mouth 3 (three) times daily.    . AZITHROMYCIN 250 MG PO TABS Oral Take  250-500 mg by mouth daily. Take 2 tablets on day 1 and 1 tablet days 2 through 5.    . CITALOPRAM HYDROBROMIDE 40 MG PO TABS Oral Take 40 mg by mouth daily.    . DULOXETINE HCL 60 MG PO CPEP Oral Take 60 mg by mouth daily.    Marland Kitchen LEVOTHYROXINE SODIUM 50 MCG PO TABS Oral Take 50 mcg by mouth daily.    Marland Kitchen METFORMIN HCL ER 500 MG PO TB24 Oral Take 1,000 mg by mouth 2 (two) times daily.    . QUETIAPINE FUMARATE 300 MG PO TABS Oral Take 300 mg by mouth at bedtime.    Marland Kitchen ROPINIROLE HCL 1 MG PO TABS Oral Take 1 mg by mouth daily.    Marland Kitchen ROSUVASTATIN CALCIUM 20 MG PO TABS Oral Take 20 mg by mouth daily.    . TRAZODONE HCL 150 MG PO TABS Oral Take 150-300 mg by mouth at bedtime as needed. For sleep      BP 129/95  Pulse 103  Temp 98.2 F (36.8 C) (Oral)  Resp 16  SpO2 99%  Physical Exam  Constitutional: She is oriented to person, place, and time. She appears well-developed and well-nourished.  HENT:  Head: Normocephalic and atraumatic.  Eyes: EOM are normal. Pupils are equal, round, and reactive to light.  Neck: Neck supple.  Cardiovascular: Normal rate, regular rhythm  and normal heart sounds.   No murmur heard. Pulmonary/Chest: Effort normal. No respiratory distress.  Abdominal: Soft. She exhibits no distension. There is no tenderness. There is no rebound and no guarding.  Neurological: She is alert and oriented to person, place, and time.  Skin: Skin is warm and dry.    ED Course  Procedures (including critical care time)  Labs Reviewed  CBC - Abnormal; Notable for the following:    Hemoglobin 11.9 (*)     HCT 34.0 (*)     All other components within normal limits  COMPREHENSIVE METABOLIC PANEL - Abnormal; Notable for the following:    Glucose, Bld 173 (*)     Albumin 3.4 (*)     All other components within normal limits  URINE RAPID DRUG SCREEN (HOSP PERFORMED) - Abnormal; Notable for the following:    Benzodiazepines POSITIVE (*)     All other components within normal limits    GLUCOSE, CAPILLARY - Abnormal; Notable for the following:    Glucose-Capillary 132 (*)     All other components within normal limits  AMMONIA  URINALYSIS, ROUTINE W REFLEX MICROSCOPIC  URINE CULTURE   Dg Chest 2 View  01/26/2012  *RADIOLOGY REPORT*  Clinical Data: Altered mental status.  CHEST - 2 VIEW  Comparison: 12/03/2010  Findings: Mild peribronchial thickening.  Heart is borderline in size.  Bibasilar linear densities, similar to prior study, likely scarring.  No confluent opacities otherwise.  Apparent trace bilateral effusions on the lateral view.  IMPRESSION: Bronchitic changes.  Suspect trace bilateral effusions.  Original Report Authenticated By: Cyndie Chime, M.D.   Ct Head Wo Contrast  01/26/2012  *RADIOLOGY REPORT*  Clinical Data: Altered mental status, drowsiness  CT HEAD WITHOUT CONTRAST  Technique:  Contiguous axial images were obtained from the base of the skull through the vertex without contrast.  Comparison: 04/21/2011  Findings: The bony calvarium is intact and no gross soft tissue abnormality is identified.  Paranasal sinuses are within normal limits as visualized.  Mild atrophic changes are seen.  No findings to suggest acute hemorrhage, acute infarction or space-occupying mass lesion are noted.  IMPRESSION: No acute intracranial abnormality is noted.  Original Report Authenticated By: Phillips Odor, M.D.     No diagnosis found.    MDM  DDx: Sepsis syndrome ACS syndrome DKA ICH Stroke CHF exacerbation COPD exacerbation Infection - pneumonia/UTI/Cellulitis PE Dehydration Electrolyte abnormality Tox syndrome  Pt comes in with no SIRs criteria, but AMS. Pt has hx of UTI which has led to sepsis before. UA currently shows evidence of infection. All the other labs are largely WNL. Given pt is a nursing home patient, she needs to be treated as a complicated cystitis. Spoke with hospitalist, who believes patient would be better served by going back to the  nursing home as the vitals and labs are largely unremarkable. I spoke at length with the nursing home about the dx in the ED and d/c instructions.         Derwood Kaplan, MD 01/31/12 [phone]

## 2012-01-26 NOTE — ED Provider Notes (Signed)
History     CSN: [id number]  Arrival date & time 01/26/12  1635   First MD Initiated Contact with Patient 01/26/12 1651      Chief Complaint  Patient presents with  . Altered Mental Status    (Consider location/radiation/quality/duration/timing/severity/associated sxs/prior treatment) Patient is a 61 y.o. female presenting with altered mental status. The history is provided by the patient and a relative.  Altered Mental Status This is a new problem.   patient with mild baseline dementia presents to the emergency department which chief complaint as reported by family of altered mental status. They relate that this morning, her speech was "off the wall" and she was taken to her primary Dr. who advised her family to keep a close eye on her. As time went on, she became drowsy and her speech became somewhat slurred. Her family been checked her medications and realized that she may have taken extra Xanax and/or Seroquel. She has not noted to them any physical complaints. She has not had a known fever or cough. There's been no nausea or vomiting. No modifying factors.  Past Medical History  Diagnosis Date  . Hypertension   . Depression   . Diabetes mellitus   . Dementia     History reviewed. No pertinent past surgical history.  No family history on file.  History  Substance Use Topics  . Smoking status: Never Smoker   . Smokeless tobacco: Not on file  . Alcohol Use: No     Review of Systems  Unable to perform ROS Psychiatric/Behavioral: Positive for altered mental status.  Unable to perform due to AMS and dementia.  Allergies  Penicillins  Home Medications   Current Outpatient Rx  Name Route Sig Dispense Refill  . ALPRAZOLAM 1 MG PO TABS Oral Take 1 mg by mouth 3 (three) times daily.    . AZITHROMYCIN 250 MG PO TABS Oral Take 250-500 mg by mouth daily. Take 2 tablets on day 1 and 1 tablet days 2 through 5.    . CITALOPRAM HYDROBROMIDE 40 MG PO TABS Oral Take 40 mg by  mouth daily.    . DULOXETINE HCL 60 MG PO CPEP Oral Take 60 mg by mouth daily.    Marland Kitchen LEVOTHYROXINE SODIUM 50 MCG PO TABS Oral Take 50 mcg by mouth daily.    Marland Kitchen METFORMIN HCL ER 500 MG PO TB24 Oral Take 1,000 mg by mouth 2 (two) times daily.    . QUETIAPINE FUMARATE 300 MG PO TABS Oral Take 300 mg by mouth at bedtime.    Marland Kitchen ROPINIROLE HCL 1 MG PO TABS Oral Take 1 mg by mouth daily.    Marland Kitchen ROSUVASTATIN CALCIUM 20 MG PO TABS Oral Take 20 mg by mouth daily.    . TRAZODONE HCL 150 MG PO TABS Oral Take 150-300 mg by mouth at bedtime as needed. For sleep      BP 129/95  Pulse 103  Temp 98.2 F (36.8 C) (Oral)  Resp 16  SpO2 99%  Physical Exam  Nursing note and vitals reviewed. Constitutional: She appears well-developed and well-nourished. No distress.  HENT:  Head: Normocephalic and atraumatic.  Right Ear: External ear normal.  Left Ear: External ear normal.       Oral mucosa dry  Eyes: Conjunctivae are normal. Pupils are equal, round, and reactive to light.       Pt does not cooperate with EOM testing. Denies blurred vision.  Neck: Neck supple.  Cardiovascular:  Tachycardia with regular rhythm, normal heart sounds.  Pulmonary/Chest: Effort normal and breath sounds normal. No respiratory distress. She has no wheezes. She has no rales.  Abdominal: Soft. Bowel sounds are normal. She exhibits no distension. There is no tenderness.  Musculoskeletal: She exhibits no edema and no tenderness.  Neurological: GCS eye subscore is 3. GCS verbal subscore is 4. GCS motor subscore is 6.       Drowsy but easily arousable to spoken voice. Oriented to person and president, not to date. Patient does not stay awake long enough to cooperate with cranial nerve testing. Does report preserved sensation to light touch in extremities. Bilateral grip strength is 5 out of 5. Bilateral foot plantar and dorsi flexion is 5 out of 5.  Skin: Skin is warm and dry.    ED Course  Procedures (including critical care  time)  Labs Reviewed  CBC - Abnormal; Notable for the following:    Hemoglobin 11.9 (*)     HCT 34.0 (*)     All other components within normal limits  COMPREHENSIVE METABOLIC PANEL - Abnormal; Notable for the following:    Glucose, Bld 173 (*)     Albumin 3.4 (*)     All other components within normal limits  URINE RAPID DRUG SCREEN (HOSP PERFORMED) - Abnormal; Notable for the following:    Benzodiazepines POSITIVE (*)     All other components within normal limits  GLUCOSE, CAPILLARY - Abnormal; Notable for the following:    Glucose-Capillary 132 (*)     All other components within normal limits  AMMONIA  URINALYSIS, ROUTINE W REFLEX MICROSCOPIC  URINE CULTURE   Dg Chest 2 View  01/26/2012  *RADIOLOGY REPORT*  Clinical Data: Altered mental status.  CHEST - 2 VIEW  Comparison: 12/03/2010  Findings: Mild peribronchial thickening.  Heart is borderline in size.  Bibasilar linear densities, similar to prior study, likely scarring.  No confluent opacities otherwise.  Apparent trace bilateral effusions on the lateral view.  IMPRESSION: Bronchitic changes.  Suspect trace bilateral effusions.  Original Report Authenticated By: Cyndie Chime, M.D.   Ct Head Wo Contrast  01/26/2012  *RADIOLOGY REPORT*  Clinical Data: Altered mental status, drowsiness  CT HEAD WITHOUT CONTRAST  Technique:  Contiguous axial images were obtained from the base of the skull through the vertex without contrast.  Comparison: 04/21/2011  Findings: The bony calvarium is intact and no gross soft tissue abnormality is identified.  Paranasal sinuses are within normal limits as visualized.  Mild atrophic changes are seen.  No findings to suggest acute hemorrhage, acute infarction or space-occupying mass lesion are noted.  IMPRESSION: No acute intracranial abnormality is noted.  Original Report Authenticated By: Phillips Odor, M.D.    Date: 01/26/2012  Rate: 106  Rhythm: sinus tachycardia with multiple PACs  QRS Axis: normal   Intervals: normal  ST/T Wave abnormalities: normal  Conduction Disutrbances:none  Narrative Interpretation: compared to 03/28/2004, prolonged P is chronic, anterior t-waves have flattened  Old EKG Reviewed: changes noted      MDM  Altered mental status with drowsiness. Questionable overdose. Per the husband who is not present at the initial examination but does arrive, patient is at her baseline mental status and often has incoherent speech. He reports her only change is drowsiness. Initially with dry oral mucosa and tachycardia. 1 L bolus of normal saline is administered and heart rate has lowered to the 90s. Patient does not appear septic and has no leukocytosis, evidence of urinary infection,  or evidence of pneumonia. No hyperammonemia. Metabolic panel is unremarkable. CT scan of the head shows no acute findings. Given the potential for her symptoms to be related solely to excess benzodiazepine ingestion, we will observe her in the emergency department for several hours. If she does not begin to improve in an appropriate amount of time, she should be admitted to the hospital. Plan is discussed with attending provider. Report is given to NP Manus Rudd, who will continue to monitor and disposition as appropriate.        Shaaron Adler, PA-C 01/26/12 2025

## 2012-01-26 NOTE — ED Notes (Signed)
Family remains at bedside.

## 2012-01-26 NOTE — ED Provider Notes (Signed)
  Physical Exam  BP 129/95  Pulse 103  Temp 98.2 F (36.8 C) (Oral)  Resp 16  SpO2 99%  Physical Exam Patient is conversing with her family was at the bedside.  They state she is at her baseline mental status.  I had the patient's Foley catheter removed, and she was able to walk to the bathroom unassisted ED Course  Procedures  MDM Discharge home with familiy      Arman Filter, NP 01/26/12 2214

## 2012-01-26 NOTE — ED Notes (Signed)
Shift change report received at bedside from EMT Brandon.  

## 2012-01-27 LAB — URINE CULTURE
Colony Count: NO GROWTH
Culture: NO GROWTH

## 2012-01-27 NOTE — ED Provider Notes (Signed)
Medical screening examination/treatment/procedure(s) were conducted as a shared visit with non-physician practitioner(s) and myself.  I personally evaluated the patient during the encounter  Pt came in with cc of AMS. Hx of dementia, pt on benzo. Noted to be somnolent, and having some confusion. Initial impression was that this was likely benzo overdose. VSS, CT head is neg. We waited for a few hours, and patient became alert and responsive. D/C home after i verbalized careful d/c instruction to the son.  Derwood Kaplan, MD 01/27/12 1610

## 2012-01-27 NOTE — ED Provider Notes (Signed)
Medical screening examination/treatment/procedure(s) were conducted as a shared visit with non-physician practitioner(s) and myself.  I personally evaluated the patient during the encounter  Marie Potts, MD 01/27/12 0148 

## 2012-03-13 ENCOUNTER — Encounter: Payer: Self-pay | Admitting: Gastroenterology

## 2012-04-11 ENCOUNTER — Ambulatory Visit (AMBULATORY_SURGERY_CENTER): Payer: BC Managed Care – PPO | Admitting: *Deleted

## 2012-04-11 VITALS — Ht 65.0 in | Wt 174.1 lb

## 2012-04-11 DIAGNOSIS — Z1211 Encounter for screening for malignant neoplasm of colon: Secondary | ICD-10-CM

## 2012-04-11 MED ORDER — NA SULFATE-K SULFATE-MG SULF 17.5-3.13-1.6 GM/177ML PO SOLN: ORAL | Status: DC

## 2012-04-17 ENCOUNTER — Other Ambulatory Visit: Payer: Self-pay | Admitting: Gynecology

## 2012-04-25 ENCOUNTER — Encounter: Payer: Self-pay | Admitting: Gastroenterology

## 2012-04-25 ENCOUNTER — Ambulatory Visit (AMBULATORY_SURGERY_CENTER): Payer: BC Managed Care – PPO | Admitting: Gastroenterology

## 2012-04-25 VITALS — BP 138/78 | HR 64 | Temp 98.2°F | Resp 46 | Ht 65.0 in | Wt 174.0 lb

## 2012-04-25 DIAGNOSIS — Z1211 Encounter for screening for malignant neoplasm of colon: Secondary | ICD-10-CM

## 2012-04-25 LAB — GLUCOSE, CAPILLARY: Glucose-Capillary: 132 mg/dL — ABNORMAL HIGH (ref 70–99)

## 2012-04-25 MED ORDER — SODIUM CHLORIDE 0.9 % IV SOLN: 500.0000 mL | INTRAVENOUS | Status: DC

## 2012-04-25 NOTE — Patient Instructions (Addendum)
YOU HAD AN ENDOSCOPIC PROCEDURE TODAY AT THE [facility]: Refer to the procedure report that was given to you for any specific questions about what was found during the examination.  If the procedure report does not answer your questions, please call your gastroenterologist to clarify.  If you requested that your care partner not be given the details of your procedure findings, then the procedure report has been included in a sealed envelope for you to review at your convenience later.  YOU SHOULD EXPECT: Some feelings of bloating in the abdomen. Passage of more gas than usual.  Walking can help get rid of the air that was put into your GI tract during the procedure and reduce the bloating. If you had a lower endoscopy (such as a colonoscopy or flexible sigmoidoscopy) you may notice spotting of blood in your stool or on the toilet paper. If you underwent a bowel prep for your procedure, then you may not have a normal bowel movement for a few days.  DIET: Your first meal following the procedure should be a light meal and then it is ok to progress to your normal diet.  A half-sandwich or bowl of soup is an example of a good first meal.  Heavy or fried foods are harder to digest and may make you feel nauseous or bloated.  Likewise meals heavy in dairy and vegetables can cause extra gas to form and this can also increase the bloating.  Drink plenty of fluids but you should avoid alcoholic beverages for 24 hours.  ACTIVITY: Your care partner should take you home directly after the procedure.  You should plan to take it easy, moving slowly for the rest of the day.  You can resume normal activity the day after the procedure however you should NOT DRIVE or use heavy machinery for 24 hours (because of the sedation medicines used during the test).    SYMPTOMS TO REPORT IMMEDIATELY: A gastroenterologist can be reached at any hour.  During normal business hours, 8:30 AM to 5:00 PM Monday through Friday,  call [phone].  After hours and on weekends, please call the GI answering service at [phone] who will take a message and have the physician on call contact you.   Following lower endoscopy (colonoscopy or flexible sigmoidoscopy):  Excessive amounts of blood in the stool  Significant tenderness or worsening of abdominal pains  Swelling of the abdomen that is new, acute  Fever of 100F or higher  FOLLOW UP:  Our staff will call the home number listed on your records the next business day following your procedure to check on you and address any questions or concerns that you may have at that time regarding the information given to you following your procedure. This is a courtesy call and so if there is no answer at the home number and we have not heard from you through the emergency physician on call, we will assume that you have returned to your regular daily activities without incident.  SIGNATURES/CONFIDENTIALITY: You and/or your care partner have signed paperwork which will be entered into your electronic medical record.  These signatures attest to the fact that that the information above on your After Visit Summary has been reviewed and is understood.  Full responsibility of the confidentiality of this discharge information lies with you and/or your care-partner.   Ok to resume your normal medications  Follow up colonoscopy in 7 years

## 2012-04-25 NOTE — Progress Notes (Signed)
Patient did not experience any of the following events: a burn prior to discharge; a fall within the facility; wrong site/side/patient/procedure/implant event; or a hospital transfer or hospital admission upon discharge from the facility. (G8907) Patient did not have preoperative order for IV antibiotic SSI prophylaxis. ([id number])  

## 2012-04-25 NOTE — Op Note (Signed)
[facility] [address] [place], [address]   COLONOSCOPY PROCEDURE REPORT  PATIENT: Marie [name], Marie [name].  MR#: [id number] BIRTHDATE: 09-03-1950 , 61  yrs. old GENDER: Female ENDOSCOPIST: Louis Meckel, MD REFERRED XB:[id number] Lomax, M.D.  Lucky Cowboy, M.D. PROCEDURE DATE:  04/25/2012 PROCEDURE:   Colonoscopy, diagnostic ASA CLASS:   Class II INDICATIONS:average risk screening. MEDICATIONS: MAC sedation, administered by CRNA and propofol (Diprivan) 350mg  IV  DESCRIPTION OF PROCEDURE:   After the risks benefits and alternatives of the procedure were thoroughly explained, informed consent was obtained.  A digital rectal exam revealed no abnormalities of the rectum.   The LB CF-[id number]  endoscope was introduced through the anus and advanced to the cecum, which was identified by the ileocecal valve. No adverse events experienced.   Limited by poor preparation.   The quality of the prep was Suprep fair   There was a moderate amount of retained , thick liquid stool.  The instrument was then slowly withdrawn as the colon was fully examined.      COLON FINDINGS: A normal appearing cecum (no photos available) The ascending, hepatic flexure, transverse, splenic flexure, descending, sigmoid colon and rectum appeared unremarkable.  No polyps or cancers were seen.  Retroflexed views revealed no abnormalities. The time to cecum=10 minutes 22 seconds.  Withdrawal time=6 minutes 47 seconds.  The scope was withdrawn and the procedure completed. COMPLICATIONS: There were no complications.  ENDOSCOPIC IMPRESSION: Normal colon  RECOMMENDATIONS: Colonoscopy in 7 years due to limitations of today's exam   eSigned:  Louis Meckel, MD 04/25/2012 10:53 AM   cc:

## 2012-04-26 ENCOUNTER — Telehealth: Payer: Self-pay | Admitting: *Deleted

## 2012-04-26 NOTE — Telephone Encounter (Signed)
Left message that we called for f/u 

## 2012-06-14 ENCOUNTER — Other Ambulatory Visit: Payer: Self-pay | Admitting: Gynecology

## 2012-06-14 DIAGNOSIS — Z1231 Encounter for screening mammogram for malignant neoplasm of breast: Secondary | ICD-10-CM

## 2012-07-16 ENCOUNTER — Ambulatory Visit: Payer: BC Managed Care – PPO

## 2012-08-06 ENCOUNTER — Ambulatory Visit: Payer: BC Managed Care – PPO

## 2012-08-07 ENCOUNTER — Ambulatory Visit: Payer: BC Managed Care – PPO

## 2012-08-10 ENCOUNTER — Ambulatory Visit: Payer: BC Managed Care – PPO

## 2012-08-30 ENCOUNTER — Ambulatory Visit: Payer: BC Managed Care – PPO

## 2013-04-23 ENCOUNTER — Other Ambulatory Visit: Payer: Self-pay | Admitting: Internal Medicine

## 2013-04-23 DIAGNOSIS — H532 Diplopia: Secondary | ICD-10-CM

## 2013-04-23 DIAGNOSIS — R270 Ataxia, unspecified: Secondary | ICD-10-CM

## 2013-05-01 ENCOUNTER — Ambulatory Visit
Admission: RE | Admit: 2013-05-01 | Discharge: 2013-05-01 | Disposition: A | Discharge status: Home / Self Care | Payer: Medicare Other | Source: Ambulatory Visit | Attending: Internal Medicine | Admitting: Internal Medicine

## 2013-05-01 DIAGNOSIS — R270 Ataxia, unspecified: Secondary | ICD-10-CM

## 2013-05-01 DIAGNOSIS — H532 Diplopia: Secondary | ICD-10-CM

## 2013-05-01 MED ORDER — GADOBENATE DIMEGLUMINE 529 MG/ML IV SOLN
14.0000 mL | Freq: Once | INTRAVENOUS | Status: AC | PRN
  Administered 2013-05-01: 14 mL via INTRAVENOUS

## 2013-06-12 ENCOUNTER — Encounter: Payer: Self-pay | Admitting: Internal Medicine

## 2013-06-12 DIAGNOSIS — E039 Hypothyroidism, unspecified: Secondary | ICD-10-CM | POA: Insufficient documentation

## 2013-06-12 DIAGNOSIS — F0280 Dementia in other diseases classified elsewhere without behavioral disturbance: Secondary | ICD-10-CM | POA: Insufficient documentation

## 2013-06-12 DIAGNOSIS — E119 Type 2 diabetes mellitus without complications: Secondary | ICD-10-CM | POA: Insufficient documentation

## 2013-06-12 DIAGNOSIS — F3341 Major depressive disorder, recurrent, in partial remission: Secondary | ICD-10-CM | POA: Insufficient documentation

## 2013-06-13 ENCOUNTER — Ambulatory Visit: Payer: Self-pay | Admitting: Physician Assistant

## 2013-07-01 ENCOUNTER — Other Ambulatory Visit: Payer: Self-pay | Admitting: Physician Assistant

## 2013-07-03 ENCOUNTER — Ambulatory Visit (INDEPENDENT_AMBULATORY_CARE_PROVIDER_SITE_OTHER): Payer: Medicare Other | Admitting: Physician Assistant

## 2013-07-03 ENCOUNTER — Encounter: Payer: Self-pay | Admitting: Physician Assistant

## 2013-07-03 VITALS — BP 120/68 | HR 100 | Temp 97.5°F | Resp 16 | Ht 62.0 in | Wt 146.0 lb

## 2013-07-03 DIAGNOSIS — E559 Vitamin D deficiency, unspecified: Secondary | ICD-10-CM

## 2013-07-03 DIAGNOSIS — E119 Type 2 diabetes mellitus without complications: Secondary | ICD-10-CM

## 2013-07-03 DIAGNOSIS — Z79899 Other long term (current) drug therapy: Secondary | ICD-10-CM

## 2013-07-03 DIAGNOSIS — E785 Hyperlipidemia, unspecified: Secondary | ICD-10-CM

## 2013-07-03 DIAGNOSIS — I10 Essential (primary) hypertension: Secondary | ICD-10-CM

## 2013-07-03 DIAGNOSIS — E079 Disorder of thyroid, unspecified: Secondary | ICD-10-CM

## 2013-07-03 LAB — LIPID PANEL
Cholesterol: 186 mg/dL (ref 0–200)
HDL: 42 mg/dL (ref >39)
LDL Cholesterol: 118 mg/dL — ABNORMAL HIGH (ref 0–99)
Total CHOL/HDL Ratio: 4.4 Ratio
Triglycerides: 132 mg/dL (ref <150)
VLDL: 26 mg/dL (ref 0–40)

## 2013-07-03 LAB — BASIC METABOLIC PANEL WITH GFR
BUN: 7 mg/dL (ref 6–23)
CO2: 29 mEq/L (ref 19–32)
CREATININE: 0.72 mg/dL (ref 0.50–1.10)
Calcium: 9.2 mg/dL (ref 8.4–10.5)
Chloride: 104 mEq/L (ref 96–112)
GFR, Est African American: >89 mL/min
GFR, Est Non African American: >89 mL/min
GLUCOSE: 108 mg/dL — AB (ref 70–99)
Potassium: 4 mEq/L (ref 3.5–5.3)
Sodium: 141 mEq/L (ref 135–145)

## 2013-07-03 LAB — HEPATIC FUNCTION PANEL
ALBUMIN: 3.8 g/dL (ref 3.5–5.2)
ALT: 8 U/L (ref 0–35)
AST: 15 U/L (ref 0–37)
Alkaline Phosphatase: 49 U/L (ref 39–117)
Bilirubin, Direct: 0.1 mg/dL (ref 0.0–0.3)
Indirect Bilirubin: 0.7 mg/dL (ref 0.0–0.9)
Total Bilirubin: 0.8 mg/dL (ref 0.3–1.2)
Total Protein: 6.1 g/dL (ref 6.0–8.3)

## 2013-07-03 LAB — CBC WITH DIFFERENTIAL/PLATELET
BASOS PCT: 1 % (ref 0–1)
Basophils Absolute: 0 10*3/uL (ref 0.0–0.1)
Eosinophils Absolute: 0.1 10*3/uL (ref 0.0–0.7)
Eosinophils Relative: 2 % (ref 0–5)
HEMATOCRIT: 36.3 % (ref 36.0–46.0)
HEMOGLOBIN: 12.3 g/dL (ref 12.0–15.0)
LYMPHS PCT: 19 % (ref 12–46)
Lymphs Abs: 1.2 10*3/uL (ref 0.7–4.0)
MCH: 30.3 pg (ref 26.0–34.0)
MCHC: 33.9 g/dL (ref 30.0–36.0)
MCV: 89.4 fL (ref 78.0–100.0)
MONOS PCT: 8 % (ref 3–12)
Monocytes Absolute: 0.5 10*3/uL (ref 0.1–1.0)
NEUTROS ABS: 4.3 10*3/uL (ref 1.7–7.7)
NEUTROS PCT: 70 % (ref 43–77)
Platelets: 285 10*3/uL (ref 150–400)
RBC: 4.06 MIL/uL (ref 3.87–5.11)
RDW: 13.4 % (ref 11.5–15.5)
WBC: 6.1 10*3/uL (ref 4.0–10.5)

## 2013-07-03 LAB — HEMOGLOBIN A1C
HEMOGLOBIN A1C: 6 % — AB (ref <5.7)
Mean Plasma Glucose: 126 mg/dL — ABNORMAL HIGH (ref <117)

## 2013-07-03 LAB — MAGNESIUM: MAGNESIUM: 1.2 mg/dL — AB (ref 1.5–2.5)

## 2013-07-03 MED ORDER — QUETIAPINE FUMARATE 300 MG PO TABS: ORAL_TABLET | ORAL | Status: DC

## 2013-07-03 NOTE — Progress Notes (Signed)
HPI Patient presents for 3 month follow up with hypertension, hyperlipidemia, diabetes and vitamin D. Patient's blood pressure has been controlled at home, today their BP is BP: 120/68 mmHg Patient denies chest pain, shortness of breath, dizziness.  Patient continues to have double vision, neg MRI and has appointment with eye doctor.  Patient's cholesterol is diet controlled. In addition they are on crestor and denies myalgias. The cholesterol last visit was LDL 129.  The patient has been working on diet and exercise for Diabetes, and denies changes in vision, polys, and paresthesias. A1C 6.1 Patient is on Vitamin D supplement.  Vitamin 73.  Patient is following with Dr. Toy Care for her depression and has to lock her xanax up to keep it from her daughter.  Increase stress with her daughter and kids living with her. Takes advantage of her, her food, money, and they will not leave.  Current Medications:  Current Outpatient Prescriptions on File Prior to Visit  Medication Sig Dispense Refill  . ALPRAZolam (XANAX) 1 MG tablet Take 1 mg by mouth 3 (three) times daily.      . Cholecalciferol (VITAMIN D PO) Take 6,000 Int'l Units by mouth daily.      . citalopram (CELEXA) 40 MG tablet Take 40 mg by mouth daily.      . Cyanocobalamin (VITAMIN B 12 PO) Take by mouth daily.      . DULoxetine (CYMBALTA) 60 MG capsule Take 60 mg by mouth daily.      Marland Kitchen levothyroxine (SYNTHROID, LEVOTHROID) 50 MCG tablet Take 50 mcg by mouth daily.      . metFORMIN (GLUCOPHAGE-XR) 500 MG 24 hr tablet Take 1,000 mg by mouth 2 (two) times daily.      . QUEtiapine (SEROQUEL) 300 MG tablet TAKE 1 TABLET BY MOUTH EVERY NIGHT AT BEDTIME  30 tablet  0  . rOPINIRole (REQUIP) 1 MG tablet Take 1 mg by mouth daily.      . rosuvastatin (CRESTOR) 20 MG tablet Take 20 mg by mouth daily.      . traZODone (DESYREL) 150 MG tablet Take 150-300 mg by mouth at bedtime as needed. For sleep       No current facility-administered medications on  file prior to visit.   Medical History:  Past Medical History  Diagnosis Date  . Diabetes mellitus   . Type II or unspecified type diabetes mellitus without mention of complication, not stated as uncontrolled   . Depression   . Hypertension   . Dementia   . Vitamin D deficiency   . Thyroid disease    Allergies:  Allergies  Allergen Reactions  . Penicillins Rash    ROS Constitutional: Denies fever, chills, headaches, insomnia, fatigue, night sweats Eyes: Denies redness, blurred vision, diplopia, discharge, itchy, watery eyes.  ENT: Denies congestion, post nasal drip, sore throat, earache, dental pain, Tinnitus, Vertigo, Sinus pain, snoring.  Cardio: Denies chest pain, palpitations, irregular heartbeat, dyspnea, diaphoresis, orthopnea, PND, claudication, edema Respiratory: denies cough, shortness of breath, wheezing.  Gastrointestinal: Denies dysphagia, heartburn, AB pain/ cramps, N/V, diarrhea, constipation, hematemesis, melena, hematochezia,  hemorrhoids Genitourinary: Denies dysuria, frequency, urgency, nocturia, hesitancy, discharge, hematuria, flank pain Musculoskeletal: Denies myalgia, stiffness, pain, swelling and strain/sprain. Skin: Denies pruritis, rash, changing in skin lesion Neuro: Denies Weakness, tremor, incoordination, spasms, pain Psychiatric: Denies confusion, memory loss, sensory loss Endocrine: Denies change in weight, skin, hair change, nocturia Diabetic Polys, Denies visual blurring, hyper /hypo glycemic episodes, and paresthesia, Heme/Lymph: Denies Excessive bleeding, bruising, enlarged lymph nodes  Family history- Review and unchanged Social history- Review and unchanged Physical Exam: Filed Vitals:   07/03/13 1136  BP: 120/68  Pulse: 100  Temp: 97.5 F (36.4 C)  Resp: 16   Filed Weights   07/03/13 1136  Weight: 146 lb (66.225 kg)   General Appearance: Well nourished, in no apparent distress. Eyes: PERRLA, EOMs, conjunctiva no swelling or  erythema Sinuses: No Frontal/maxillary tenderness ENT/Mouth: Ext aud canals clear, TMs without erythema, bulging. No erythema, swelling, or exudate on post pharynx.  Tonsils not swollen or erythematous. Hearing normal.  Neck: Supple, thyroid normal.  Respiratory: Respiratory effort normal, BS equal bilaterally without rales, rhonchi, wheezing or stridor.  Cardio: RRR with no MRGs. Brisk peripheral pulses without edema.  Abdomen: Soft, + BS.  Non tender, no guarding, rebound, hernias, masses. Lymphatics: Non tender without lymphadenopathy.  Musculoskeletal: Full ROM, 5/5 strength, normal gait.  Skin: Warm, dry without rashes, lesions, ecchymosis.  Neuro: Cranial nerves intact. No cerebellar symptoms. Sensation intact.  Psych: Awake and oriented X 3, normal affect, Insight and Judgment appropriate.   Assessment and Plan:  Hypertension: Continue medication, monitor blood pressure at home.  Continue DASH diet. Cholesterol: Continue diet and exercise. Check cholesterol.  Diabetes-Continue diet and exercise. Check A1C Vitamin D Def- check level and continue medications.   Continue diet and meds as discussed. Further disposition pending results of labs. Discussed med's effects and SE's.    Vicie Mutters 11:49 AM

## 2013-07-03 NOTE — Patient Instructions (Signed)
 Bad carbs also include fruit juice, alcohol, and sweet tea. These are empty calories that do not signal to your brain that you are full.   Please remember the good carbs are still carbs which convert into sugar. So please measure them out no more than 1/2-1 cup of rice, oatmeal, pasta, and beans.  Veggies are however free foods! Pile them on.   I like lean protein at every meal such as chicken, turkey, pork chops, cottage cheese, etc. Just do not fry these meats and please center your meal around vegetable, the meats should be a side dish.   No all fruit is created equal. Please see the list below, the fruit at the bottom is higher in sugars than the fruit at the top   Fat and Cholesterol Control Diet Fat and cholesterol levels in your blood and organs are influenced by your diet. High levels of fat and cholesterol may lead to diseases of the heart, small and large blood vessels, gallbladder, liver, and pancreas. CONTROLLING FAT AND CHOLESTEROL WITH DIET Although exercise and lifestyle factors are important, your diet is key. That is because certain foods are known to raise cholesterol and others to lower it. The goal is to balance foods for their effect on cholesterol and more importantly, to replace saturated and trans fat with other types of fat, such as monounsaturated fat, polyunsaturated fat, and omega-3 fatty acids. On average, a person should consume no more than 15 to 17 g of saturated fat daily. Saturated and trans fats are considered "bad" fats, and they will raise LDL cholesterol. Saturated fats are primarily found in animal products such as meats, butter, and cream. However, that does not mean you need to give up all your favorite foods. Today, there are good tasting, low-fat, low-cholesterol substitutes for most of the things you like to eat. Choose low-fat or nonfat alternatives. Choose round or loin cuts of red meat. These types of cuts are lowest in fat and cholesterol. Chicken  (without the skin), fish, veal, and ground turkey breast are great choices. Eliminate fatty meats, such as hot dogs and salami. Even shellfish have little or no saturated fat. Have a 3 oz (85 g) portion when you eat lean meat, poultry, or fish. Trans fats are also called "partially hydrogenated oils." They are oils that have been scientifically manipulated so that they are solid at room temperature resulting in a longer shelf life and improved taste and texture of foods in which they are added. Trans fats are found in stick margarine, some tub margarines, cookies, crackers, and baked goods.  When baking and cooking, oils are a great substitute for butter. The monounsaturated oils are especially beneficial since it is believed they lower LDL and raise HDL. The oils you should avoid entirely are saturated tropical oils, such as coconut and palm.  Remember to eat a lot from food groups that are naturally free of saturated and trans fat, including fish, fruit, vegetables, beans, grains (barley, rice, couscous, bulgur wheat), and pasta (without cream sauces).  IDENTIFYING FOODS THAT LOWER FAT AND CHOLESTEROL  Soluble fiber may lower your cholesterol. This type of fiber is found in fruits such as apples, vegetables such as broccoli, potatoes, and carrots, legumes such as beans, peas, and lentils, and grains such as barley. Foods fortified with plant sterols (phytosterol) may also lower cholesterol. You should eat at least 2 g per day of these foods for a cholesterol lowering effect.  Read package labels to identify low-saturated fats,   trans fat free, and low-fat foods at the supermarket. Select cheeses that have only 2 to 3 g saturated fat per ounce. Use a heart-healthy tub margarine that is free of trans fats or partially hydrogenated oil. When buying baked goods (cookies, crackers), avoid partially hydrogenated oils. Breads and muffins should be made from whole grains (whole-wheat or whole oat flour, instead of  "flour" or "enriched flour"). Buy non-creamy canned soups with reduced salt and no added fats.  FOOD PREPARATION TECHNIQUES  Never deep-fry. If you must fry, either stir-fry, which uses very little fat, or use non-stick cooking sprays. When possible, broil, bake, or roast meats, and steam vegetables. Instead of putting butter or margarine on vegetables, use lemon and herbs, applesauce, and cinnamon (for squash and sweet potatoes). Use nonfat yogurt, salsa, and low-fat dressings for salads.  LOW-SATURATED FAT / LOW-FAT FOOD SUBSTITUTES Meats / Saturated Fat (g)  Avoid: Steak, marbled (3 oz/85 g) / 11 g  Choose: Steak, lean (3 oz/85 g) / 4 g  Avoid: Hamburger (3 oz/85 g) / 7 g  Choose: Hamburger, lean (3 oz/85 g) / 5 g  Avoid: Ham (3 oz/85 g) / 6 g  Choose: Ham, lean cut (3 oz/85 g) / 2.4 g  Avoid: Chicken, with skin, dark meat (3 oz/85 g) / 4 g  Choose: Chicken, skin removed, dark meat (3 oz/85 g) / 2 g  Avoid: Chicken, with skin, light meat (3 oz/85 g) / 2.5 g  Choose: Chicken, skin removed, light meat (3 oz/85 g) / 1 g Dairy / Saturated Fat (g)  Avoid: Whole milk (1 cup) / 5 g  Choose: Low-fat milk, 2% (1 cup) / 3 g  Choose: Low-fat milk, 1% (1 cup) / 1.5 g  Choose: Skim milk (1 cup) / 0.3 g  Avoid: Hard cheese (1 oz/28 g) / 6 g  Choose: Skim milk cheese (1 oz/28 g) / 2 to 3 g  Avoid: Cottage cheese, 4% fat (1 cup) / 6.5 g  Choose: Low-fat cottage cheese, 1% fat (1 cup) / 1.5 g  Avoid: Ice cream (1 cup) / 9 g  Choose: Sherbet (1 cup) / 2.5 g  Choose: Nonfat frozen yogurt (1 cup) / 0.3 g  Choose: Frozen fruit bar / trace  Avoid: Whipped cream (1 tbs) / 3.5 g  Choose: Nondairy whipped topping (1 tbs) / 1 g Condiments / Saturated Fat (g)  Avoid: Mayonnaise (1 tbs) / 2 g  Choose: Low-fat mayonnaise (1 tbs) / 1 g  Avoid: Butter (1 tbs) / 7 g  Choose: Extra light margarine (1 tbs) / 1 g  Avoid: Coconut oil (1 tbs) / 11.8 g  Choose: Olive oil (1 tbs) / 1.8  g  Choose: Corn oil (1 tbs) / 1.7 g  Choose: Safflower oil (1 tbs) / 1.2 g  Choose: Sunflower oil (1 tbs) / 1.4 g  Choose: Soybean oil (1 tbs) / 2.4 g  Choose: Canola oil (1 tbs) / 1 g Document Released: 06/13/2005 Document Revised: 10/08/2012 Document Reviewed: 12/02/2010 [employer] Patient Information 2014 [employer].  

## 2013-07-04 LAB — TSH: TSH: 2.222 u[IU]/mL (ref 0.350–4.500)

## 2013-07-04 LAB — VITAMIN D 25 HYDROXY (VIT D DEFICIENCY, FRACTURES): VIT D 25 HYDROXY: 61 ng/mL (ref 30–89)

## 2013-07-08 ENCOUNTER — Telehealth: Payer: Self-pay

## 2013-07-08 NOTE — Telephone Encounter (Signed)
Patient called wanting her lab results, advised her that we had previously talked and I gave her the results and instructions, she states that she did not remember our conversation, I went over lab results instructions again

## 2013-08-05 ENCOUNTER — Other Ambulatory Visit: Payer: Self-pay | Admitting: Physician Assistant

## 2013-08-05 MED ORDER — LOSARTAN POTASSIUM 50 MG PO TABS: 50.0000 mg | ORAL_TABLET | Freq: Every day | ORAL | Status: DC

## 2013-08-25 ENCOUNTER — Other Ambulatory Visit: Payer: Self-pay | Admitting: Physician Assistant

## 2013-09-02 ENCOUNTER — Other Ambulatory Visit: Payer: Self-pay | Admitting: Internal Medicine

## 2013-09-02 MED ORDER — GLUCOSE BLOOD VI STRP: ORAL_STRIP | Status: DC

## 2013-09-04 ENCOUNTER — Other Ambulatory Visit: Payer: Self-pay | Admitting: Physician Assistant

## 2013-09-11 ENCOUNTER — Ambulatory Visit: Payer: Self-pay | Admitting: Internal Medicine

## 2013-09-20 ENCOUNTER — Ambulatory Visit (INDEPENDENT_AMBULATORY_CARE_PROVIDER_SITE_OTHER): Payer: Medicare Other | Admitting: Physician Assistant

## 2013-09-20 ENCOUNTER — Other Ambulatory Visit: Payer: Self-pay | Admitting: Physician Assistant

## 2013-09-20 ENCOUNTER — Encounter (INDEPENDENT_AMBULATORY_CARE_PROVIDER_SITE_OTHER): Payer: Medicare Other | Admitting: Physician Assistant

## 2013-09-20 ENCOUNTER — Encounter: Payer: Self-pay | Admitting: Physician Assistant

## 2013-09-20 VITALS — BP 120/70 | HR 80 | Temp 98.1°F | Resp 16 | Wt 146.0 lb

## 2013-09-20 DIAGNOSIS — B852 Pediculosis, unspecified: Secondary | ICD-10-CM

## 2013-09-20 NOTE — Patient Instructions (Signed)
Head and Pubic Lice Lice are tiny, light brown insects with claws on the ends of their legs. They are small parasites that live on the human body. Lice often make their home in your hair. They hatch from little round eggs (nits), which are attached to the base of hairs. They spread by:  Direct contact with an infested person.  Infested personal items such as combs, brushes, towels, clothing, pillow cases and sheets. The parasite that causes your condition may also live in clothes which have been worn within the week before treatment. Therefore, it is necessary to wash your clothes, bed linens, towels, combs and brushes. Any woolens can be put in an air-tight plastic bag for one week. You need to use fresh clothes, towels and sheets after your treatment is completed. Re-treatment is usually not necessary if instructions are followed. If necessary, treatment may be repeated in 7 days. The entire family may require treatment.   Pyrethrins; Piperonyl Butoxide Shampoo What is this medicine? PYRETHINS; PIPERONYL BUTOXIDE (pi RETH rins; PI per on il byoo TOX ide) is used to treat lice. It acts by destroying the lice, but it does not destroy their eggs (nits). This medicine may be used to treat head lice, body lice, or pubic lice. This medicine may be used for other purposes; ask your health care provider or pharmacist if you have questions. COMMON BRAND NAME(S): R154 Lice Killing, M086 LiceTreatment, Lice Killing Shampoo, LiceMD Complete, Pronto Plus, Pronto Shampoo with Conditioner, Pronto, RID Complete Lice Elimination, RID Lice Killing What should I tell my health care provider before I take this medicine? They need to know if you have any of these conditions: -asthma -an unusual or allergic reaction to pyrethrins, piperonyl butoxide, other medicines, foods, dyes, ragweed, or preservatives -pregnant or trying to get pregnant -breast-feeding How should I use this medicine? This medicine is for  external use only. Do not take by mouth. Follow the directions on the package label. Use this medicine in a well ventilated area. Shake well before use. Apply to dry hair. Keep this medicine away from your eyes, mouth, nose, and vaginal opening. Apply to affected area until all the hair is thoroughly wet with product. Keep this medicine on your hair or affected area for 10 minutes. After 10 minutes, add warm water then rub into a lather. Rinse and dry with a clean towel. Use a nit comb to remove any of the remaining lice or nits. A second treatment must be done in 7 to 10 days to kill any newly hatched lice. Talk to your pediatrician regarding the use of this medicine in children. While this drug may be prescribed for children as young as 42 years old for selected conditions, precautions do apply. Overdosage: If you think you have taken too much of this medicine contact a poison control center or emergency room at once. NOTE: This medicine is only for you. Do not share this medicine with others. What if I miss a dose? If you miss a dose, use it as soon as you can. If it is almost time for your next dose, use only that dose. Do not use double or extra doses. What may interact with this medicine? Interactions are not expected. This list may not describe all possible interactions. Give your health care provider a list of all the medicines, herbs, non-prescription drugs, or dietary supplements you use. Also tell them if you smoke, drink alcohol, or use illegal drugs. Some items may interact with your medicine. What  should I watch for while using this medicine? Lice infections can cause itching and irritation. This medicine may cause more itching for a short time after use. This does not mean that the medicine did not work. Lice can be spread from one person to another by direct contact with clothing, hats, scarves, bedding, towels, washcloths, hairbrushes, and combs. All members of the house should be checked  for lice. Treat everyone who is infected. For cases of pubic lice, sexual partners should be treated at the same time to prevent reinfection. To prevent reinfection or spreading of the infection, the following steps should be taken: Machine wash all clothing, bedding, towels, and washcloths in very hot water and dry them using the hot cycle of a dryer for at least 20 minutes. Clothing or bedding that cannot be washed should be dry cleaned or sealed in an airtight plastic bag for 4 weeks. Shampoo any wigs or hairpieces. You should also wash all hairbrushes and combs in very hot soapy water (above 130 F) for 5 to 10 minutes. Do not share your hairbrushes or combs with other people. Wash all toys in very hot water (above 130 F) for 5 to 10 minutes or seal in an airtight plastic bag for 4 weeks. Also, clean the house or room by vacuuming furniture, rugs, and floors. What side effects may I notice from receiving this medicine? Side effects that you should report to your doctor or health care professional as soon as possible: -allergic reactions like skin rash, itching or hives, swelling of the face, lips, or tongue -breathing problems -skin burning, irritation Side effects that usually do not require medical attention (report to your doctor or health care professional if they continue or are bothersome): -dry, itchy, red skin -tingling sensation This list may not describe all possible side effects. Call your doctor for medical advice about side effects. You may report side effects to FDA at 1-800-FDA-1088. Where should I keep my medicine? Keep out of the reach of children. Store at room temperature between 15 and 30 degrees C (59 and 86 degrees F). Throw away any unused medicine after the expiration date. NOTE: This sheet is a summary. It may not cover all possible information. If you have questions about this medicine, talk to your doctor, pharmacist, or health care provider.  2014, [employer]vier/[employer]  Standard. (2008-01-18 14:07:53)  TREATMENT  Apply enough medicated shampoo or cream to wet hair and skin in and around the infected areas.  Work thoroughly into hair and leave in according to instructions.  Add a small amount of water until a good lather forms.  Rinse thoroughly.  Towel briskly.  When hair is dry, any remaining nits, cream or shampoo may be removed with a fine-tooth comb or tweezers. The nits resemble dandruff; however they are glued to the hair follicle and are difficult to brush out. Frequent fine combing and shampoos are necessary. A towel soaked in white vinegar and left on the hair for 2 hours will also help soften the glue which holds the nits on the hair. Medicated shampoo or cream should not be used on children or pregnant women without a caregiver's prescription or instructions. SEEK MEDICAL CARE IF:   You or your child develops sores that look infected.  The rash does not go away in one week.  The lice or nits return or persist in spite of treatment. Document Released: 06/13/2005 Document Revised: 09/05/2011 Document Reviewed: 01/10/2007 [facility] Patient Information 2014 [place].

## 2013-09-20 NOTE — Progress Notes (Signed)
   Subjective:    Patient ID: Marie [name], female    DOB: 04-Dec-1950, 63 y.o.   MRN: [id number]  HPI 63 y.o. female presents for a rash. Her daughter and their family was living with her and causing a lot of stress but she just finally asked them to leave and states she is doing better. She states she has lost a lot of weight due to stress. She was at her hair dresser and she states she saw lice or "something in her hair" and she just learned that her granddaughter that was living with her had lice. She has since thrown out the mattresses and and cleaned there room. She has not done any treatment on herself yet. She states she has a sore on her neck that her hairdresser pointed out.    Review of Systems  Constitutional: Negative.   HENT: Negative.   Gastrointestinal: Negative.   Skin: Positive for rash.       Objective:   Physical Exam  Constitutional: She is oriented to person, place, and time. She appears well-developed and well-nourished.  HENT:  Head: Normocephalic and atraumatic.  Right Ear: External ear normal.  Left Ear: External ear normal.  Mouth/Throat: Oropharynx is clear and moist.  Eyes: Conjunctivae and EOM are normal. Pupils are equal, round, and reactive to light.  Neck: Normal range of motion. Neck supple. No thyromegaly present.  Cardiovascular: Normal rate, regular rhythm and normal heart sounds.  Exam reveals no gallop and no friction rub.   No murmur heard. Pulmonary/Chest: Effort normal and breath sounds normal. No respiratory distress. She has no wheezes.  Abdominal: Soft. Bowel sounds are normal. She exhibits no distension and no mass. There is no tenderness. There is no rebound and no guarding.  Musculoskeletal: Normal range of motion.  Lymphadenopathy:    She has no cervical adenopathy.  Neurological: She is alert and oriented to person, place, and time. She displays normal reflexes. No cranial nerve deficit. Coordination normal.  Skin: Skin is warm and  dry.  excoriations on neck and small nits visible in hair  Psychiatric: She has a normal mood and affect.        Assessment & Plan:

## 2013-09-20 NOTE — Progress Notes (Signed)
error 

## 2013-10-04 ENCOUNTER — Ambulatory Visit (INDEPENDENT_AMBULATORY_CARE_PROVIDER_SITE_OTHER): Payer: Medicare Other | Admitting: Internal Medicine

## 2013-10-04 ENCOUNTER — Encounter: Payer: Self-pay | Admitting: Internal Medicine

## 2013-10-04 ENCOUNTER — Other Ambulatory Visit: Payer: Self-pay | Admitting: Internal Medicine

## 2013-10-04 VITALS — BP 102/70 | HR 88 | Temp 97.5°F | Resp 16 | Ht 62.75 in | Wt 146.0 lb

## 2013-10-04 DIAGNOSIS — E782 Mixed hyperlipidemia: Secondary | ICD-10-CM

## 2013-10-04 DIAGNOSIS — E119 Type 2 diabetes mellitus without complications: Secondary | ICD-10-CM

## 2013-10-04 DIAGNOSIS — Z79899 Other long term (current) drug therapy: Secondary | ICD-10-CM

## 2013-10-04 DIAGNOSIS — E559 Vitamin D deficiency, unspecified: Secondary | ICD-10-CM | POA: Insufficient documentation

## 2013-10-04 DIAGNOSIS — I10 Essential (primary) hypertension: Secondary | ICD-10-CM

## 2013-10-04 LAB — CBC WITH DIFFERENTIAL/PLATELET
Basophils Absolute: 0 10*3/uL (ref 0.0–0.1)
Basophils Relative: 1 % (ref 0–1)
EOS ABS: 0.2 10*3/uL (ref 0.0–0.7)
Eosinophils Relative: 5 % (ref 0–5)
HCT: 36.1 % (ref 36.0–46.0)
HEMOGLOBIN: 12.6 g/dL (ref 12.0–15.0)
LYMPHS ABS: 1.4 10*3/uL (ref 0.7–4.0)
Lymphocytes Relative: 30 % (ref 12–46)
MCH: 30.4 pg (ref 26.0–34.0)
MCHC: 34.9 g/dL (ref 30.0–36.0)
MCV: 87.2 fL (ref 78.0–100.0)
Monocytes Absolute: 0.5 10*3/uL (ref 0.1–1.0)
Monocytes Relative: 11 % (ref 3–12)
NEUTROS ABS: 2.4 10*3/uL (ref 1.7–7.7)
NEUTROS PCT: 53 % (ref 43–77)
Platelets: 289 10*3/uL (ref 150–400)
RBC: 4.14 MIL/uL (ref 3.87–5.11)
RDW: 13.7 % (ref 11.5–15.5)
WBC: 4.6 10*3/uL (ref 4.0–10.5)

## 2013-10-04 NOTE — Patient Instructions (Signed)

## 2013-10-04 NOTE — Progress Notes (Signed)
Patient ID: Marie [name], female   DOB: [birth year], 63 y.o.   MRN: [id number]    This very nice 63 y.o. MWF presents for 6 month follow up with Hypertension, Hyperlipidemia, Pre-Diabetes and Vitamin D Deficiency.    HTN predates since 2000. BP has been controlled at home. Today's BP: 102/70 mmHg . Patient denies any cardiac type chest pain, palpitations, dyspnea/orthopnea/PND, dizziness, claudication, or dependent edema.   Hyperlipidemia is controlled with diet & meds. Last Cholesterol was 186, Triglycerides were 132, HDL 42 and LDL 118  In Jan 2015 - above goal presumed due to poor dietary compliance.  Patient denies myalgias or other med SE's.    Also, the patient has history of T2 NIDDM since 2002 with last A1c of 6.1% in Sept 2014. Patient was re-instructed by the nurse today how to operate her glucometer. 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   Patient is on SS Disability for Depression and Dementia and is followed by Dr Toy Care. Brain MRI in Nov 2014 was interpreted "Normal for Age" with stable white matter changes.    Further, Patient has history of Vitamin D Deficiency of 60 in 2008 with last vitamin D of 72 in Sept @014 .. Patient supplements vitamin D without any suspected side-effects.  Medication Sig  . ALPRAZolam (XANAX) 1 MG tablet Take 1 mg by mouth 3 (three) times daily.  Marland Kitchen atorvastatin (LIPITOR) 80 MG tablet   . cetirizine (ZYRTEC) 10 MG tablet TAKE 1 TABLET BY MOUTH ONCE DAILY AT BEDTIME  . Cholecalciferol (VITAMIN D PO) Take 6,000 Int'l Units by mouth daily.  . Cyanocobalamin (VITAMIN B 12 PO) Take by mouth daily.  . DULoxetine (CYMBALTA) 60 MG capsule Take 60 mg by mouth daily.  Marland Kitchen glucose blood (FREESTYLE LITE) test strip Use as instructed  . losartan (COZAAR) 50 MG tablet Take 1 tablet (50 mg total) by mouth daily.  . metFORMIN (GLUCOPHAGE-XR) 500 MG 24 hr   Take 1,000 mg by mouth 2 (two) times daily.  Marland Kitchen NAMENDA XR 28 MG CP24 Daily  . QUEtiapine (SEROQUEL) 300 MG tablet TAKE 1 TABLET BY MOUTH EVERY NIGHT AT BEDTIME  . rOPINIRole (REQUIP) 1 MG tablet Take 1 mg by mouth daily.    Allergies  Allergen Reactions  . Penicillins Rash   PMHx:   Past Medical History  Diagnosis Date  . Diabetes mellitus   . Type II or unspecified type diabetes mellitus without mention of complication, not stated as uncontrolled   . Depression   . Hypertension   . Dementia   . Vitamin D deficiency   . Thyroid disease     FHx:    Reviewed / unchanged  SHx:    Reviewed / unchanged   Systems Review: Constitutional: Denies fever, chills, wt changes, headaches, insomnia, fatigue, night sweats, change in appetite. Eyes: Denies redness, blurred vision, diplopia, discharge, itchy, watery eyes.  ENT: Denies discharge, congestion, post nasal drip, epistaxis, sore throat, earache, hearing loss, dental pain, tinnitus, vertigo, sinus pain, snoring.  CV: Denies chest pain, palpitations, irregular heartbeat, syncope, dyspnea, diaphoresis, orthopnea, PND, claudication, edema. Respiratory: denies cough, dyspnea, DOE, pleurisy, hoarseness, laryngitis, wheezing.  Gastrointestinal: Denies dysphagia, odynophagia, heartburn, reflux, water brash, abdominal pain or cramps, nausea, vomiting, bloating, diarrhea, constipation, hematemesis, melena, hematochezia,  or hemorrhoids. Genitourinary: Denies dysuria, frequency, urgency, nocturia, hesitancy, discharge, hematuria, flank pain. Musculoskeletal: Denies arthralgias, myalgias, stiffness, jt. swelling, pain, limp, strain/sprain.  Skin: Denies pruritus, rash, hives, warts, acne, eczema, change in skin lesion(s). Neuro: No weakness, tremor, incoordination, spasms, paresthesia, or pain. Psychiatric: Denies confusion, memory loss, or sensory loss. Endo: Denies change in weight, skin, hair change.  Heme/Lymph: No excessive bleeding,  bruising, orenlarged lymph nodes.  Exam:    BP 102/70  Pulse 88  Temp 97.5 F   Resp 16  Ht 5' 2.75"   Wt 146 lb   BMI 26.06 kg/m2  Appears well nourished - in no distress. Eyes: PERRLA, EOMs, conjunctiva no swelling or erythema. Sinuses: No frontal/maxillary tenderness ENT/Mouth: EAC's clear, TM's nl w/o erythema, bulging. Nares clear w/o erythema, swelling, exudates. Oropharynx clear without erythema or exudates. Oral hygiene is good. Tongue normal, non obstructing. Hearing intact.  Neck: Supple. Thyroid nl. Car 2+/2+ without bruits, nodes or JVD. Chest: Respirations nl with BS clear & equal w/o rales, rhonchi, wheezing or stridor.  Cor: Heart sounds normal w/ regular rate and rhythm without sig. murmurs, gallops, clicks, or rubs. Peripheral pulses normal and equal  without edema.  Abdomen: Soft & bowel sounds normal. Non-tender w/o guarding, rebound, hernias, masses, or organomegaly.  Lymphatics: Unremarkable.  Musculoskeletal: Full ROM all peripheral extremities, joint stability, 5/5 strength, and normal gait.  Skin: Warm, dry without exposed rashes, lesions, ecchymosis apparent.  Neuro: Cranial nerves intact, reflexes equal bilaterally. Sensory-motor testing grossly intact. Tendon reflexes grossly intact.  Pysch: Alert & oriented x 3. Insight and judgement nl & appropriate. No ideations.  Assessment and Plan:  1. Hypertension - Continue monitor blood pressure at home. Continue diet/meds same.  2. Hyperlipidemia - Continue diet/meds, exercise,& lifestyle modifications. Continue monitor periodic cholesterol/liver & renal functions   3. T2 NIDDM - continue recommend prudent low glycemic diet, weight control, regular exercise, diabetic monitoring and periodic eye exams.  4. Vitamin D Deficiency - Continue supplementation.  5. Depression/Dementia  Recommended regular exercise, BP monitoring, weight control, and discussed med and SE's. Recommended labs to assess and monitor  clinical status. Further disposition pending results of labs.

## 2013-10-05 LAB — LIPID PANEL
CHOL/HDL RATIO: 2.9 ratio
Cholesterol: 132 mg/dL (ref 0–200)
HDL: 45 mg/dL (ref >39)
LDL Cholesterol: 59 mg/dL (ref 0–99)
Triglycerides: 140 mg/dL (ref <150)
VLDL: 28 mg/dL (ref 0–40)

## 2013-10-05 LAB — HEMOGLOBIN A1C
HEMOGLOBIN A1C: 5.9 % — AB (ref <5.7)
Mean Plasma Glucose: 123 mg/dL — ABNORMAL HIGH (ref <117)

## 2013-10-05 LAB — BASIC METABOLIC PANEL WITH GFR
BUN: 6 mg/dL (ref 6–23)
CO2: 27 meq/L (ref 19–32)
Calcium: 9.2 mg/dL (ref 8.4–10.5)
Chloride: 103 mEq/L (ref 96–112)
Creat: 0.82 mg/dL (ref 0.50–1.10)
GFR, Est African American: 89 mL/min
GFR, Est Non African American: 77 mL/min
Glucose, Bld: 122 mg/dL — ABNORMAL HIGH (ref 70–99)
POTASSIUM: 4.6 meq/L (ref 3.5–5.3)
SODIUM: 142 meq/L (ref 135–145)

## 2013-10-05 LAB — VITAMIN D 25 HYDROXY (VIT D DEFICIENCY, FRACTURES): Vit D, 25-Hydroxy: 50 ng/mL (ref 30–89)

## 2013-10-05 LAB — HEPATIC FUNCTION PANEL
ALT: 14 U/L (ref 0–35)
AST: 17 U/L (ref 0–37)
Albumin: 3.9 g/dL (ref 3.5–5.2)
Alkaline Phosphatase: 71 U/L (ref 39–117)
Bilirubin, Direct: 0.1 mg/dL (ref 0.0–0.3)
Indirect Bilirubin: 0.5 mg/dL (ref 0.2–1.2)
Total Bilirubin: 0.6 mg/dL (ref 0.2–1.2)
Total Protein: 6 g/dL (ref 6.0–8.3)

## 2013-10-05 LAB — MAGNESIUM: Magnesium: 1.5 mg/dL (ref 1.5–2.5)

## 2013-10-05 LAB — TSH: TSH: 3.454 u[IU]/mL (ref 0.350–4.500)

## 2013-10-05 LAB — INSULIN, FASTING: Insulin fasting, serum: 9 u[IU]/mL (ref 3–28)

## 2013-10-15 ENCOUNTER — Ambulatory Visit (INDEPENDENT_AMBULATORY_CARE_PROVIDER_SITE_OTHER): Payer: Medicare Other | Admitting: Physician Assistant

## 2013-10-15 ENCOUNTER — Telehealth: Payer: Self-pay | Admitting: *Deleted

## 2013-10-15 ENCOUNTER — Encounter: Payer: Self-pay | Admitting: Physician Assistant

## 2013-10-15 VITALS — BP 102/62 | HR 88 | Temp 97.7°F | Resp 16 | Wt 142.0 lb

## 2013-10-15 DIAGNOSIS — F411 Generalized anxiety disorder: Secondary | ICD-10-CM

## 2013-10-15 DIAGNOSIS — B852 Pediculosis, unspecified: Secondary | ICD-10-CM

## 2013-10-15 MED ORDER — MOMETASONE FUROATE 0.1 % EX SOLN: Freq: Every day | CUTANEOUS | Status: DC

## 2013-10-15 MED ORDER — PERMETHRIN 5 % EX CREA: 1.0000 "application " | TOPICAL_CREAM | Freq: Once | CUTANEOUS | Status: DC

## 2013-10-15 NOTE — Patient Instructions (Signed)
Scabies Scabies are small bugs (mites) that burrow under the skin and cause red bumps and severe itching. These bugs can only be seen with a microscope. Scabies are highly contagious. They can spread easily from person to person by direct contact. They are also spread through sharing clothing or linens that have the scabies mites living in them. It is not unusual for an entire family to become infected through shared towels, clothing, or bedding.  HOME CARE INSTRUCTIONS   Your caregiver may prescribe a cream or lotion to kill the mites. If cream is prescribed, massage the cream into the entire body from the neck to the bottom of both feet. Also massage the cream into the scalp and face if your child is less than 62 year old. Avoid the eyes and mouth. Do not wash your hands after application.  Leave the cream on for 8 to 12 hours. Your child should bathe or shower after the 8 to 12 hour application period. Sometimes it is helpful to apply the cream to your child right before bedtime.  One treatment is usually effective and will eliminate approximately 95% of infestations. For severe cases, your caregiver may decide to repeat the treatment in 1 week. Everyone in your household should be treated with one application of the cream.  New rashes or burrows should not appear within 24 to 48 hours after successful treatment. However, the itching and rash may last for 2 to 4 weeks after successful treatment. Your caregiver may prescribe a medicine to help with the itching or to help the rash go away more quickly.  Scabies can live on clothing or linens for up to 3 days. All of your child's recently used clothing, towels, stuffed toys, and bed linens should be washed in hot water and then dried in a dryer for at least 20 minutes on high heat. Items that cannot be washed should be enclosed in a plastic bag for at least 3 days.  To help relieve itching, bathe your child in a cool bath or apply cool washcloths to the  affected areas.  Your child may return to school after treatment with the prescribed cream. SEEK MEDICAL CARE IF:   The itching persists longer than 4 weeks after treatment.  The rash spreads or becomes infected. Signs of infection include red blisters or yellow-tan crust. Document Released: 06/13/2005 Document Revised: 09/05/2011 Document Reviewed: 10/22/2008 [facility] Patient Information 2014 [place]. Psoriasis Psoriasis is a common, long-lasting (chronic) inflammation of the skin. It affects both men and women equally, of all ages and all races. Psoriasis cannot be passed from person to person (not contagious). Psoriasis varies from mild to very severe. When severe, it can greatly affect your quality of life. Psoriasis is an inflammatory disorder affecting the skin as well as other organs including the joints (causing an arthritis). With psoriasis, the skin sheds its top layer of cells more rapidly than it does in someone without psoriasis. CAUSES  The cause of psoriasis is largely unknown. Genetics, your immune system, and the environment seem to play a role in causing psoriasis. Factors that can make psoriasis worse include:  Damage or trauma to the skin, such as cuts, scrapes, and sunburn. This damage often causes new areas of psoriasis (lesions).  Winter dryness and lack of sunlight.  Medicines such as lithium, beta-blockers, antimalarial drugs, ACE inhibitors, nonsteroidal anti-inflammatory drugs (ibuprofen, aspirin), and terbinafine. Let your caregiver know if you are taking any of these drugs.  Alcohol. Excessive alcohol use should  be avoided if you have psoriasis. Drinking large amounts of alcohol can affect:  How well your psoriasis treatment works.  How safe your psoriasis treatment is.  Smoking. If you smoke, ask your caregiver for help to quit.  Stress.  Bacterial or viral infections.  Arthritis. Arthritis associated with psoriasis (psoriatic arthritis) affects  less than 10% of patients with psoriasis. The arthritic intensity does not always match the skin psoriasis intensity. It is important to let your caregiver know if your joints hurt or if they are stiff. SYMPTOMS  The most common form of psoriasis begins with little red bumps that gradually become larger. The bumps begin to form scales that flake off easily. The lower layers of scales stick together. When these scales are scratched or removed, the underlying skin is tender and bleeds easily. These areas then grow in size and may become large. Psoriasis often creates a rash that looks the same on both sides of the body (symmetrical). It often affects the elbows, knees, groin, genitals, arms, legs, scalp, and nails. Affected nails often have pitting, loosen, thicken, crumble, and are difficult to treat.  "Inverse psoriasis"occurs in the armpits, under breasts, in skin folds, and around the groin, buttocks, and genitals.  "Guttate psoriasis" generally occurs in children and young adults following a recent sore throat (strep throat). It begins with many small, red, scaly spots on the skin. It clears spontaneously in weeks or a few months without treatment. DIAGNOSIS  Psoriasis is diagnosed by physical exam. A tissue sample (biopsy) may also be taken. TREATMENT The treatment of psoriasis depends on your age, health, and living conditions.  Steroid (cortisone) creams, lotions, and ointments may be used. These treatments are associated with thinning of the skin, blood vessels that get larger (dilated), loss of skin pigmentation, and easy bruising. It is important to use these steroids as directed by your caregiver. Only treat the affected areas and not the normal, unaffected skin. People on long-term steroid treatment should wear a medical alert bracelet. Injections may be used in areas that are difficult to treat.  Scalp treatments are available as shampoos, solutions, sprays, foams, and oils. Avoid  scratching the scalp and picking at the scales.  Anthralin medicine works well on areas that are difficult to treat. However, it stains clothes and skin and may cause temporary irritation.  Synthetic vitamin D (calcipotriene)can be used on small areas. It is available by prescription. The forms of synthetic vitamin D available in health food stores do not help with psoriasis.  Coal tarsare available in various strengths for psoriasis that is difficult to treat. They are one of the longest used treatments for difficult to treat psoriasis. However, they are messy to use.  Light therapy (UV therapy) can be carefully and professionally monitored in a dermatologist's office. Careful sunbathing is helpful for many people as directed by your caregiver. The exposure should be just long enough to cause a mild redness (erythema) of your skin. Avoid sunburn as this may make the condition worse. Sunscreen (SPF of 30 or higher) should be used to protect against sunburn. Cataracts, wrinkles, and skin aging are some of the harmful side effects of light therapy.  If creams (topical medicines) fail, there are several other options for systemic or oral medicines your caregiver can suggest. Psoriasis can sometimes be very difficult to treat. It can come and go. It is necessary to follow up with your caregiver regularly if your psoriasis is difficult to treat. Usually, with persistence you can get  a good amount of relief. Maintaining consistent care is important. Do not change caregivers just because you do not see immediate results. It may take several trials to find the right combination of treatment for you. PREVENTING FLARE-UPS  Wear gloves while you wash dishes, while cleaning, and when you are outside in the cold.  If you have radiators, place a bowl of water or damp towel on the radiator. This will help put water back in the air. You can also use a humidifier to keep the air moist. Try to keep the humidity at  about 60% in your home.  Apply moisturizer while your skin is still damp from bathing or showering. This traps water in the skin.  Avoid long, hot baths or showers. Keep soap use to a minimum. Soaps dry out the skin and wash away the protective oils. Use a fragrance free, dye free soap.  Drink enough water and fluids to keep your urine clear or pale yellow. Not drinking enough water depletes your skin's water supply.  Turn off the heat at night and keep it low during the day. Cool air is less drying. SEEK MEDICAL CARE IF:  You have increasing pain in the affected areas.  You have uncontrolled bleeding in the affected areas.  You have increasing redness or warmth in the affected areas.  You start to have pain or stiffness in your joints.  You start feeling depressed about your condition.  You have a fever. Document Released: 06/10/2000 Document Revised: 09/05/2011 Document Reviewed: 12/06/2010 [facility] Patient Information 2014 [place], [place].

## 2013-10-15 NOTE — Progress Notes (Signed)
   Subjective:    Patient ID: Marie [name], female    DOB: 21-Jun-1951, 63 y.o.   MRN: [id number]  HPI 63 y.o. female treated for lice in march presents with a sore in her scalp. She states that her daughter and grandkids moved back in. She states she was brushing her granddaughters hair and she found lice. She has treated them and her husband can not find any lice but she states she has a sore in her head. She states she has been picking and scratching at her scalp, denies pruritis.   She was late for her appointment today and when asked she feels that tomorrow is Friday, the year was 2014. She states this is due to stress and she has an appointment with Dr. Buddy Duty tomorrow, normal MRI.    Review of Systems  Constitutional: Negative.   HENT: Negative.   Respiratory: Negative.   Cardiovascular: Negative.   Gastrointestinal: Negative.   Genitourinary: Negative.   Skin: Positive for rash.  Neurological: Negative.   Psychiatric/Behavioral: Positive for confusion and dysphoric mood. Negative for suicidal ideas and sleep disturbance. The patient is nervous/anxious.        Objective:   Physical Exam  Constitutional: She is oriented to person, place, and time. She appears well-developed and well-nourished.  HENT:  Head: Normocephalic and atraumatic.  Right Ear: External ear normal.  Left Ear: External ear normal.  Mouth/Throat: Oropharynx is clear and moist.  Eyes: Conjunctivae and EOM are normal. Pupils are equal, round, and reactive to light.  Neck: Normal range of motion. Neck supple. No thyromegaly present.  Cardiovascular: Normal rate, regular rhythm and normal heart sounds.  Exam reveals no gallop and no friction rub.   No murmur heard. Pulmonary/Chest: Effort normal and breath sounds normal. No respiratory distress. She has no wheezes.  Abdominal: Soft. Bowel sounds are normal. She exhibits no distension and no mass. There is no tenderness. There is no rebound and no guarding.   Musculoskeletal: Normal range of motion.  Lymphadenopathy:    She has no cervical adenopathy.  Neurological: She is alert and oriented to person, place, and time. She displays normal reflexes. No cranial nerve deficit. Coordination normal.  Skin: Skin is warm and dry. Rash noted.  Along base of neck/scalp has erythematous nodules with scabing, some ulcerative areas on base of neck and top of head.   Psychiatric: Her behavior is normal. Judgment and thought content normal. Her mood appears anxious. Her speech is tangential. Cognition and memory are impaired.       Assessment & Plan:  ? Lice vs psoriasis vs trichotillomania/anxiety- Will treat for scabies/lice since recent sick contact, will also treat for possible psorisis, if it is not better will refer to Derm. Will follow up with Dr. Toy Care and I will send a note.

## 2013-10-15 NOTE — Telephone Encounter (Signed)
Sent them to [facility]

## 2013-10-15 NOTE — Telephone Encounter (Signed)
PT TOLD YOU TO SEND RX'S TO [facility] BUT THAT WAS WRONG THEY GO TO [facility] CAN WE CHANGE THIS?

## 2013-10-28 ENCOUNTER — Other Ambulatory Visit: Payer: Self-pay | Admitting: Physician Assistant

## 2013-12-06 ENCOUNTER — Emergency Department (HOSPITAL_COMMUNITY)
Admission: EM | Admit: 2013-12-06 | Discharge: 2013-12-06 | Disposition: A | Discharge status: Home / Self Care | Payer: Medicare Other | Attending: Emergency Medicine | Admitting: Emergency Medicine

## 2013-12-06 ENCOUNTER — Emergency Department (HOSPITAL_COMMUNITY): Payer: Medicare Other

## 2013-12-06 ENCOUNTER — Encounter (HOSPITAL_COMMUNITY): Payer: Self-pay | Admitting: Emergency Medicine

## 2013-12-06 DIAGNOSIS — S0990XA Unspecified injury of head, initial encounter: Secondary | ICD-10-CM | POA: Diagnosis present

## 2013-12-06 DIAGNOSIS — Z8639 Personal history of other endocrine, nutritional and metabolic disease: Secondary | ICD-10-CM | POA: Diagnosis not present

## 2013-12-06 DIAGNOSIS — S060XAA Concussion with loss of consciousness status unknown, initial encounter: Secondary | ICD-10-CM

## 2013-12-06 DIAGNOSIS — F329 Major depressive disorder, single episode, unspecified: Secondary | ICD-10-CM | POA: Insufficient documentation

## 2013-12-06 DIAGNOSIS — F3289 Other specified depressive episodes: Secondary | ICD-10-CM | POA: Insufficient documentation

## 2013-12-06 DIAGNOSIS — Z79899 Other long term (current) drug therapy: Secondary | ICD-10-CM | POA: Diagnosis not present

## 2013-12-06 DIAGNOSIS — S8990XA Unspecified injury of unspecified lower leg, initial encounter: Secondary | ICD-10-CM | POA: Diagnosis not present

## 2013-12-06 DIAGNOSIS — S99919A Unspecified injury of unspecified ankle, initial encounter: Secondary | ICD-10-CM

## 2013-12-06 DIAGNOSIS — S060X0A Concussion without loss of consciousness, initial encounter: Secondary | ICD-10-CM | POA: Insufficient documentation

## 2013-12-06 DIAGNOSIS — W010XXA Fall on same level from slipping, tripping and stumbling without subsequent striking against object, initial encounter: Secondary | ICD-10-CM | POA: Diagnosis not present

## 2013-12-06 DIAGNOSIS — F0390 Unspecified dementia without behavioral disturbance: Secondary | ICD-10-CM | POA: Insufficient documentation

## 2013-12-06 DIAGNOSIS — Y9389 Activity, other specified: Secondary | ICD-10-CM | POA: Insufficient documentation

## 2013-12-06 DIAGNOSIS — E119 Type 2 diabetes mellitus without complications: Secondary | ICD-10-CM | POA: Insufficient documentation

## 2013-12-06 DIAGNOSIS — S060X9A Concussion with loss of consciousness of unspecified duration, initial encounter: Secondary | ICD-10-CM

## 2013-12-06 DIAGNOSIS — E559 Vitamin D deficiency, unspecified: Secondary | ICD-10-CM | POA: Diagnosis not present

## 2013-12-06 DIAGNOSIS — Z862 Personal history of diseases of the blood and blood-forming organs and certain disorders involving the immune mechanism: Secondary | ICD-10-CM | POA: Diagnosis not present

## 2013-12-06 DIAGNOSIS — S99929A Unspecified injury of unspecified foot, initial encounter: Secondary | ICD-10-CM

## 2013-12-06 DIAGNOSIS — Z880 Allergy status to penicillin: Secondary | ICD-10-CM | POA: Insufficient documentation

## 2013-12-06 DIAGNOSIS — Y9289 Other specified places as the place of occurrence of the external cause: Secondary | ICD-10-CM | POA: Diagnosis not present

## 2013-12-06 DIAGNOSIS — I10 Essential (primary) hypertension: Secondary | ICD-10-CM | POA: Insufficient documentation

## 2013-12-06 LAB — COMPREHENSIVE METABOLIC PANEL
ALBUMIN: 3.5 g/dL (ref 3.5–5.2)
ALT: 9 U/L (ref 0–35)
AST: 19 U/L (ref 0–37)
Alkaline Phosphatase: 76 U/L (ref 39–117)
BILIRUBIN TOTAL: 0.6 mg/dL (ref 0.3–1.2)
BUN: 6 mg/dL (ref 6–23)
CO2: 25 mEq/L (ref 19–32)
CREATININE: 0.76 mg/dL (ref 0.50–1.10)
Calcium: 9.7 mg/dL (ref 8.4–10.5)
Chloride: 100 mEq/L (ref 96–112)
GFR calc Af Amer: >90 mL/min (ref >90)
GFR calc non Af Amer: 89 mL/min — ABNORMAL LOW (ref >90)
Glucose, Bld: 166 mg/dL — ABNORMAL HIGH (ref 70–99)
POTASSIUM: 3.5 meq/L — AB (ref 3.7–5.3)
Sodium: 140 mEq/L (ref 137–147)
Total Protein: 6.9 g/dL (ref 6.0–8.3)

## 2013-12-06 LAB — CBC
HCT: 35.4 % — ABNORMAL LOW (ref 36.0–46.0)
Hemoglobin: 12.2 g/dL (ref 12.0–15.0)
MCH: 30.3 pg (ref 26.0–34.0)
MCHC: 34.5 g/dL (ref 30.0–36.0)
MCV: 88.1 fL (ref 78.0–100.0)
PLATELETS: 297 10*3/uL (ref 150–400)
RBC: 4.02 MIL/uL (ref 3.87–5.11)
RDW: 12.8 % (ref 11.5–15.5)
WBC: 6.9 10*3/uL (ref 4.0–10.5)

## 2013-12-06 MED ORDER — ONDANSETRON HCL 4 MG/2ML IJ SOLN
4.0000 mg | Freq: Once | INTRAMUSCULAR | Status: AC
  Administered 2013-12-06: 4 mg via INTRAVENOUS
  Filled 2013-12-06: qty 2

## 2013-12-06 MED ORDER — MORPHINE SULFATE 4 MG/ML IJ SOLN
4.0000 mg | Freq: Once | INTRAMUSCULAR | Status: AC
  Administered 2013-12-06: 4 mg via INTRAVENOUS
  Filled 2013-12-06: qty 1

## 2013-12-06 MED ORDER — DIAZEPAM 5 MG PO TABS: 5.0000 mg | ORAL_TABLET | Freq: Four times a day (QID) | ORAL | Status: DC | PRN

## 2013-12-06 MED ORDER — MECLIZINE HCL 25 MG PO TABS: 25.0000 mg | ORAL_TABLET | Freq: Three times a day (TID) | ORAL | Status: DC | PRN

## 2013-12-06 NOTE — ED Notes (Signed)
Pt transported to CT ?

## 2013-12-06 NOTE — ED Provider Notes (Addendum)
Medical screening examination/treatment/procedure(s) were conducted as a shared visit with non-physician practitioner(s) and myself.  I personally evaluated the patient during the encounter.   EKG Interpretation   Date/Time:  Friday December 06 2013 13:50:31 EDT Ventricular Rate:  79 PR Interval:  126 QRS Duration: 80 QT Interval:  386 QTC Calculation: 442 R Axis:   78 Text Interpretation:  Normal sinus rhythm Nonspecific ST abnormality  Abnormal ECG Confirmed by Doreena Maulden,  DO, Christoher Drudge ([id number]) on 12/06/2013 3:53:31  PM      Pt is a 63 y.o. F with head injury several days ago for mechanical fall. She is having nausea, vomiting and vertigo since. She is neurologically intact. We'll obtain CT imaging of her head, cervical spine, basic labs. Will give symptomatic control but anticipate discharge home as this is likely postconcussive symptoms.  [place], DO 12/06/13 [place], DO 12/06/13 [id number]

## 2013-12-06 NOTE — ED Notes (Signed)
Pt slipped and hit back of head on hardwood floor. Denies LOC. Pt c/o N/V, dizziness.

## 2013-12-06 NOTE — ED Notes (Signed)
She tripped and fell Monday hitting her head. Shes had n/v and dizziness since. She c/o pain in the back of her head from the fall. She is A&Ox4, resp e/u

## 2013-12-06 NOTE — ED Provider Notes (Signed)
CSN: [id number]     Arrival date & time 12/06/13  1345 History   First MD Initiated Contact with Patient 12/06/13 1402     Chief Complaint  Patient presents with  . Head Injury     (Consider location/radiation/quality/duration/timing/severity/associated sxs/prior Treatment) HPI 63 year old female brought in by family member or chief complaint of head injury. There is difficulty obtaining the history from the patient June 2 dementia. Her daughter who supplements the history Patient fell backwards hitting her head. She has had some injuries to her knees which has been evaluated by her orthopedist. Patient fell Monday, Tuesday she had several episodes of nausea and vomiting. Patient states that she feels fine and she is laying down however when she stands up she had symptoms of vertigo and nausea. She has had to have significant help ambulating around the home. She has some occipital pain and neck pain but denies any headaches, changes in vision, unilateral weakness. She is not taking any blood thinning medications.  Past Medical History  Diagnosis Date  . Diabetes mellitus   . Type II or unspecified type diabetes mellitus without mention of complication, not stated as uncontrolled   . Depression   . Hypertension   . Dementia   . Vitamin D deficiency   . Thyroid disease    Past Surgical History  Procedure Laterality Date  . 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 Fracture surgery  2009    right arm; rod   Family History  Problem Relation Age of Onset  . Colon cancer Neg Hx   . Stomach cancer Neg Hx    History  Substance Use Topics  . Smoking status: Never Smoker   . Smokeless tobacco: Never Used  . Alcohol Use: No   OB History   Grav Para Term Preterm Abortions TAB SAB Ect Mult Living                 Review of Systems  Ten systems reviewed and are negative for acute change, except as noted in the HPI.    Allergies  Penicillins  Home Medications   Prior to  Admission medications   Medication Sig Start Date End Date Taking? Authorizing Provider  ALPRAZolam [name]) 1 MG tablet Take 1 mg by mouth 3 (three) times daily.   Yes Historical Provider, MD  atorvastatin (LIPITOR) 80 MG tablet Take 80 mg by mouth at bedtime.  06/25/13  Yes Historical Provider, MD  cetirizine (ZYRTEC) 10 MG tablet Take 10 mg by mouth at bedtime.   Yes Historical Provider, MD  Cholecalciferol (VITAMIN D PO) Take 2,000-3,000 Int'l Units by mouth daily.    Yes Historical Provider, MD  Cyanocobalamin (VITAMIN B 12 PO) Take 1,000 mg by mouth daily.    Yes Historical Provider, MD  DULoxetine (CYMBALTA) 60 MG capsule Take 60 mg by mouth daily.   Yes Historical Provider, MD  glucose blood (FREESTYLE LITE) test strip 1 each by Other route See admin instructions. Patient checks blood sugar once in the morning   Yes Historical Provider, MD  losartan (COZAAR) 50 MG tablet Take 50 mg by mouth daily at 6 PM.   Yes Historical Provider, MD  Magnesium 250 MG TABS Take 250 mg by mouth daily at 6 PM.   Yes Historical Provider, MD  metFORMIN (GLUCOPHAGE-XR) 500 MG 24 hr tablet Take 500 mg by mouth 2 (two) times daily.    Yes Historical Provider, MD  NAMENDA XR 28 MG CP24 Take  28 mg by mouth daily at 6 PM.  06/25/13  Yes Historical Provider, MD  QUEtiapine (SEROQUEL) 300 MG tablet Take 300 mg by mouth at bedtime.   Yes Historical Provider, MD  rOPINIRole (REQUIP) 1 MG tablet Take 1 mg by mouth at bedtime.   Yes Historical Provider, MD   BP 144/84  Pulse 82  Temp(Src) 98.2 F (36.8 C) (Oral)  Resp 17  Ht 5\' 6"  (1.676 m)  Wt 135 lb (61.236 kg)  BMI 21.80 kg/m2  SpO2 98% Physical Exam  Physical Exam  Nursing note and vitals reviewed. Constitutional: She is oriented to person, place, and time. She appears well-developed and well-nourished. No distress.  HENT:  Head: Normocephalic and atraumatic.  Eyes: Conjunctivae normal and EOM are normal. Pupils are equal, round, and reactive to light. No  scleral icterus.  Neck: Normal range of motion.  Cardiovascular: Normal rate, regular rhythm and normal heart sounds.  Exam reveals no gallop and no friction rub.   No murmur heard. Pulmonary/Chest: Effort normal and breath sounds normal. No respiratory distress.  Abdominal: Soft. Bowel sounds are normal. She exhibits no distension and no mass. There is no tenderness. There is no guarding.  Neurological: She is alert and oriented to person, place, and time.  Speech is clear and goal oriented, follows commands Major Cranial nerves without deficit, no facial droop Normal strength in upper and lower extremities bilaterally including dorsiflexion and plantar flexion, strong and equal grip strength Sensation normal to light and sharp touch Moves extremities without ataxia, coordination intact Normal finger to nose and rapid alternating movements Neg romberg, no pronator drift Normal gait Normal heel-shin and balance Skin: Skin is warm and dry. She is not diaphoretic.    ED Course  Procedures (including critical care time) Labs Review Labs Reviewed  CBC  COMPREHENSIVE METABOLIC PANEL    Imaging Review No results found.   EKG Interpretation None      MDM   Final diagnoses:  Concussion   Patient With head injury. SXS of vertigo.  ct pending.  neuro exam otherwise appears normal.  Patient / Family / Caregiver understand and agree with initial ED impression and plan with expectations set for ED visit.     Filed Vitals:   12/06/13 1530 12/06/13 1600 12/06/13 1630 12/06/13 1653  BP: 143/74 136/77 143/87 143/87  Pulse: 74   75  Temp:      TempSrc:      Resp: 19   17  Height:      Weight:      SpO2: 100%   100%    Patient with mild htn.  CT negative for acute bleed. Patient able to walk without ataxia in the ED. CT shows hematoma.  Mild hypokalemia, hyperglycemia. F/u pcp on those issues  Patient / Family / Caregiver informed of clinical course, understand  medical decision-making process, and agree with plan.  I personally reviewed the imaging tests through PACS system. I have reviewed and interpreted Lab values. I reviewed available ER/hospitalization records through the [place], [place] 12/07/13 1227

## 2013-12-06 NOTE — Discharge Instructions (Signed)
Concussion, Adult °A concussion, or closed-head injury, is a brain injury caused by a direct blow to the head or by a quick and sudden movement (jolt) of the head or neck. Concussions are usually not life-threatening. Even so, the effects of a concussion can be serious. If you have had a concussion before, you are more likely to experience concussion-like symptoms after a direct blow to the head.  °CAUSES  °· Direct blow to the head, such as from running into another player during a soccer game, being hit in a fight, or hitting your head on a hard surface. °· A jolt of the head or neck that causes the brain to move back and forth inside the skull, such as in a car crash. °SIGNS AND SYMPTOMS  °The signs of a concussion can be hard to notice. Early on, they may be missed by you, family members, and health care providers. You may look fine but act or feel differently. °Symptoms are usually temporary, but they may last for days, weeks, or even longer. Some symptoms may appear right away while others may not show up for hours or days. Every head injury is different. Symptoms include:  °· Mild to moderate headaches that will not go away. °· A feeling of pressure inside your head.  °· Having more trouble than usual:   °· Learning or remembering things you have heard. °· Answering questions.  °· Paying attention or concentrating.   °· Organizing daily tasks.   °· Making decisions and solving problems.   °· Slowness in thinking, acting or reacting, speaking, or reading.   °· Getting lost or being easily confused.   °· Feeling tired all the time or lacking energy (fatigued).   °· Feeling drowsy.   °· Sleep disturbances.   °· Sleeping more than usual.   °· Sleeping less than usual.   °· Trouble falling asleep.   °· Trouble sleeping (insomnia).   °· Loss of balance or feeling lightheaded or dizzy.   °· Nausea or vomiting.   °· Numbness or tingling.   °· Increased sensitivity to:   °· Sounds.   °· Lights.   °· Distractions.    °· Vision problems or eyes that tire easily.   °· Diminished sense of taste or smell.   °· Ringing in the ears.   °· Mood changes such as feeling sad or anxious.   °· Becoming easily irritated or angry for little or no reason.   °· Lack of motivation. °· Seeing or hearing things other people do not see or hear (hallucinations). °DIAGNOSIS  °Your health care provider can usually diagnose a concussion based on a description of your injury and symptoms. He or she will ask whether you passed out (lost consciousness) and whether you are having trouble remembering events that happened right before and during your injury.  °Your evaluation might include:  °· A brain scan to look for signs of injury to the brain. Even if the test shows no injury, you may still have a concussion.   °· Blood tests to be sure other problems are not present. °TREATMENT  °· Concussions are usually treated in an emergency department, in urgent care, or at a clinic. You may need to stay in the hospital overnight for further treatment.   °· Tell your health care provider if you are taking any medicines, including prescription medicines, over-the-counter medicines, and natural remedies. Some medicines, such as blood thinners (anticoagulants) and aspirin, may increase the chance of complications. Also tell your health care provider whether you have had alcohol or are taking illegal drugs. This information may affect treatment. °· Your health care provider will send you   home with important instructions to follow.  How fast you will recover from a concussion depends on many factors. These factors include how severe your concussion is, what part of your brain was injured, your age, and how healthy you were before the concussion.  Most people with mild injuries recover fully. Recovery can take time. In general, recovery is slower in older persons. Also, persons who have had a concussion in the past or have other medical problems may find that it  takes longer to recover from their current injury. HOME CARE INSTRUCTIONS  General Instructions  Carefully follow the directions your health care provider gave you.  Only take over-the-counter or prescription medicines for pain, discomfort, or fever as directed by your health care provider.  Take only those medicines that your health care provider has approved.  Do not drink alcohol until your health care provider says you are well enough to do so. Alcohol and certain other drugs may slow your recovery and can put you at risk of further injury.  If it is harder than usual to remember things, write them down.  If you are easily distracted, try to do one thing at a time. For example, do not try to watch TV while fixing dinner.  Talk with family members or close friends when making important decisions.  Keep all follow-up appointments. Repeated evaluation of your symptoms is recommended for your recovery.  Watch your symptoms and tell others to do the same. Complications sometimes occur after a concussion. Older adults with a brain injury may have a higher risk of serious complications such as of a blood clot on the brain.  Tell your teachers, school nurse, school counselor, coach, athletic trainer, or work Freight forwarder about your injury, symptoms, and restrictions. Tell them about what you can or cannot do. They should watch for:   Increased problems with attention or concentration.   Increased difficulty remembering or learning new information.   Increased time needed to complete tasks or assignments.   Increased irritability or decreased ability to cope with stress.   Increased symptoms.   Rest. Rest helps the brain to heal. Make sure you:  Get plenty of sleep at night. Avoid staying up late at night.  Keep the same bedtime hours on weekends and weekdays.  Rest during the day. Take daytime naps or rest breaks when you feel tired.  Limit activities that require a lot of  thought or concentration. These includes   Doing homework or job-related work.   Watching TV.   Working on the computer.  Avoid any situation where there is potential for another head injury (football, hockey, soccer, basketball, martial arts, downhill snow sports and horseback riding). Your condition will get worse every time you experience a concussion. You should avoid these activities until you are evaluated by the appropriate follow-up caregivers. Returning To Your Regular Activities You will need to return to your normal activities slowly, not all at once. You must give your body and brain enough time for recovery.  Do not return to sports or other athletic activities until your health care provider tells you it is safe to do so.  Ask your health care provider when you can drive, ride a bicycle, or operate heavy machinery. Your ability to react may be slower after a brain injury. Never do these activities if you are dizzy.  Ask your health care provider about when you can return to work or school. Preventing Another Concussion It is very important to avoid another  brain injury, especially before you have recovered. In rare cases, another injury can lead to permanent brain damage, brain swelling, or death. The risk of this is greatest during the first 7 10 days after a head injury. Avoid injuries by:   Wearing a seat belt when riding in a car.   Drinking alcohol only in moderation.   Wearing a helmet when biking, skiing, skateboarding, skating, or doing similar activities.  Avoiding activities that could lead to a second concussion, such as contact or recreational sports, until your health care provider says it is OK.  Taking safety measures in your home.   Remove clutter and tripping hazards from floors and stairways.   Use grab bars in bathrooms and handrails by stairs.   Place non-slip mats on floors and in bathtubs.   Improve lighting in dim areas. SEEK MEDICAL  CARE IF:   You have increased problems paying attention or concentrating.   You have increased difficulty remembering or learning new information.   You need more time to complete tasks or assignments than before.   You have increased irritability or decreased ability to cope with stress.  You have more symptoms than before. Seek medical care if you have any of the following symptoms for more than 2 weeks after your injury:   Lasting (chronic) headaches.   Dizziness or balance problems.   Nausea.  Vision problems.   Increased sensitivity to noise or light.   Depression or mood swings.   Anxiety or irritability.   Memory problems.   Difficulty concentrating or paying attention.   Sleep problems.   Feeling tired all the time. SEEK IMMEDIATE MEDICAL CARE IF:   You have severe or worsening headaches. These may be a sign of a blood clot in the brain.  You have weakness (even if only in one hand, leg, or part of the face).  You have numbness.  You have decreased coordination.   You vomit repeatedly.  You have increased sleepiness.  One pupil is larger than the other.   You have convulsions.   You have slurred speech.   You have increased confusion. This may be a sign of a blood clot in the brain.  You have increased restlessness, agitation, or irritability.   You are unable to recognize people or places.   You have neck pain.   It is difficult to wake you up.   You have unusual behavior changes.   You lose consciousness. MAKE SURE YOU:   Understand these instructions.  Will watch your condition.  Will get help right away if you are not doing well or get worse. Document Released: 09/03/2003 Document Revised: 02/13/2013 Document Reviewed: 01/03/2013 [facility] Patient Information 2014 [place], [place].  Driving and Equipment Restrictions Some medical problems make it dangerous to drive, ride a bike, or use machines. Some of  these problems are:  A hard blow to the head (concussion).  Passing out (fainting).  Twitching and shaking (seizures).  Low blood sugar.  Taking medicine to help you relax (sedatives).  Taking pain medicines.  Wearing an eye patch.  Wearing splints. This can make it hard to use parts of your body that you need to drive safely. HOME CARE   Do not drive until your doctor says it is okay.  Do not use machines until your doctor says it is okay. You may need a form signed by your doctor (medical release) before you can drive again. You may also need this form before you do other  tasks where you need to be fully alert. MAKE SURE YOU:  Understand these instructions.  Will watch your condition.  Will get help right away if you are not doing well or get worse. Document Released: 07/21/2004 Document Revised: 09/05/2011 Document Reviewed: 10/21/2009 [facility] Patient Information 2014 [place].  Post-Concussion Syndrome Post-concussion syndrome describes the symptoms that can occur after a head injury. These symptoms can last from weeks to months. CAUSES  It is not clear why some head injuries cause post-concussion syndrome. It can occur whether your head injury was mild or severe and whether you were wearing head protection or not.  SIGNS AND SYMPTOMS  Memory difficulties.  Dizziness.  Headaches.  Double vision or blurry vision.  Sensitivity to light.  Hearing difficulties.  Depression.  Tiredness.  Weakness.  Difficulty with concentration.  Difficulty sleeping or staying asleep.  Vomiting.  Poor balance or instability on your feet.  Slow reaction time.  Difficulty learning and remembering things you have heard. DIAGNOSIS  There is no test to determine whether you have post-concussion syndrome. Your health care provider may order an imaging scan of your brain, such as a CT scan, to check for other problems that may be causing your symptoms (such as  severe injury inside your skull). TREATMENT  Usually, these problems disappear over time without medical care. Your health care provider may prescribe medicine to help ease your symptoms. It is important to follow up with a neurologist to evaluate your recovery and address any lingering symptoms or issues. HOME CARE INSTRUCTIONS   Only take over-the-counter or prescription medicines for pain, discomfort, or fever as directed by your health care provider. Do not take aspirin. Aspirin can slow blood clotting.  Sleep with your head slightly elevated to help with headaches.  Avoid any situation where there is potential for another head injury (football, hockey, soccer, basketball, martial arts, downhill snow sports, and horseback riding). Your condition will get worse every time you experience a concussion. You should avoid these activities until you are evaluated by the appropriate follow-up health care providers.  Keep all follow-up appointments as directed by your health care provider. SEEK IMMEDIATE MEDICAL CARE IF:  You develop confusion or unusual drowsiness.  You cannot wake the injured person.  You develop nausea or persistent, forceful vomiting.  You feel like you are moving when you are not (vertigo).  You notice the injured person's eyes moving rapidly back and forth. This may be a sign of vertigo.  You have convulsions or faint.  You have severe, persistent headaches that are not relieved by medicine.  You cannot use your arms or legs normally.  Your pupils change size.  You have clear or bloody discharge from the nose or ears.  Your problems are getting worse, not better. MAKE SURE YOU:  Understand these instructions.  Will watch your condition.  Will get help right away if you are not doing well or get worse. Document Released: 12/03/2001 Document Revised: 04/03/2013 Document Reviewed: 12/30/2010 [facility] Patient Information 2014 [place].

## 2013-12-13 ENCOUNTER — Other Ambulatory Visit: Payer: Self-pay | Admitting: Internal Medicine

## 2013-12-18 ENCOUNTER — Ambulatory Visit: Payer: Self-pay | Admitting: Internal Medicine

## 2013-12-23 ENCOUNTER — Other Ambulatory Visit: Payer: Self-pay | Admitting: Internal Medicine

## 2013-12-30 ENCOUNTER — Other Ambulatory Visit: Payer: Self-pay | Admitting: *Deleted

## 2013-12-30 MED ORDER — GLUCOSE BLOOD VI STRP: ORAL_STRIP | Status: DC

## 2013-12-30 MED ORDER — ONETOUCH LANCETS MISC: Status: DC

## 2013-12-30 MED ORDER — ONETOUCH VERIO W/DEVICE KIT: 1.0000 | PACK | Status: DC | PRN

## 2014-01-03 ENCOUNTER — Ambulatory Visit: Payer: Self-pay | Admitting: Emergency Medicine

## 2014-01-06 ENCOUNTER — Other Ambulatory Visit: Payer: Self-pay | Admitting: Internal Medicine

## 2014-01-07 ENCOUNTER — Ambulatory Visit (INDEPENDENT_AMBULATORY_CARE_PROVIDER_SITE_OTHER): Payer: Medicare Other | Admitting: Emergency Medicine

## 2014-01-07 ENCOUNTER — Encounter: Payer: Self-pay | Admitting: Emergency Medicine

## 2014-01-07 ENCOUNTER — Other Ambulatory Visit: Payer: Self-pay | Admitting: Internal Medicine

## 2014-01-07 VITALS — BP 122/70 | HR 102 | Temp 98.0°F | Resp 16 | Ht 62.75 in | Wt 142.0 lb

## 2014-01-07 DIAGNOSIS — R7309 Other abnormal glucose: Secondary | ICD-10-CM

## 2014-01-07 DIAGNOSIS — I10 Essential (primary) hypertension: Secondary | ICD-10-CM

## 2014-01-07 DIAGNOSIS — E782 Mixed hyperlipidemia: Secondary | ICD-10-CM

## 2014-01-07 DIAGNOSIS — L03119 Cellulitis of unspecified part of limb: Secondary | ICD-10-CM

## 2014-01-07 DIAGNOSIS — L02419 Cutaneous abscess of limb, unspecified: Secondary | ICD-10-CM

## 2014-01-07 LAB — CBC WITH DIFFERENTIAL/PLATELET
Basophils Absolute: 0.1 10*3/uL (ref 0.0–0.1)
Basophils Relative: 1 % (ref 0–1)
EOS PCT: 3 % (ref 0–5)
Eosinophils Absolute: 0.2 10*3/uL (ref 0.0–0.7)
HCT: 35.9 % — ABNORMAL LOW (ref 36.0–46.0)
Hemoglobin: 12.3 g/dL (ref 12.0–15.0)
LYMPHS ABS: 1.1 10*3/uL (ref 0.7–4.0)
LYMPHS PCT: 21 % (ref 12–46)
MCH: 30.2 pg (ref 26.0–34.0)
MCHC: 34.3 g/dL (ref 30.0–36.0)
MCV: 88.2 fL (ref 78.0–100.0)
Monocytes Absolute: 0.6 10*3/uL (ref 0.1–1.0)
Monocytes Relative: 11 % (ref 3–12)
NEUTROS ABS: 3.4 10*3/uL (ref 1.7–7.7)
NEUTROS PCT: 64 % (ref 43–77)
Platelets: 290 10*3/uL (ref 150–400)
RBC: 4.07 MIL/uL (ref 3.87–5.11)
RDW: 13.8 % (ref 11.5–15.5)
WBC: 5.3 10*3/uL (ref 4.0–10.5)

## 2014-01-07 LAB — HEMOGLOBIN A1C
HEMOGLOBIN A1C: 5.8 % — AB (ref <5.7)
MEAN PLASMA GLUCOSE: 120 mg/dL — AB (ref <117)

## 2014-01-07 MED ORDER — DOXYCYCLINE HYCLATE 100 MG PO TABS: 100.0000 mg | ORAL_TABLET | Freq: Two times a day (BID) | ORAL | Status: DC

## 2014-01-07 NOTE — Progress Notes (Signed)
Subjective:    Patient ID: Marie [name], female    DOB: 1951-04-21, 63 y.o.   MRN: [id number]  HPI Comments: 63 yo WF presents for 3 month F/U for HTN, Cholesterol, DM, D. Deficient. She is eating healthier and walking more. She is not checking BP at home but notes she is overall feeling well.  She has redness of left leg x 2 days after being in [place] and [employer]. She denies pain or itch. She has not had any other new exposures. WBC             6.9   12/06/2013 HGB            12.2   12/06/2013 HCT            35.4   12/06/2013 PLT             297   12/06/2013 GLUCOSE         166   12/06/2013 CHOL            132   10/04/2013 TRIG            140   10/04/2013 HDL              45   10/04/2013 LDLCALC          59   10/04/2013 ALT               9   12/06/2013 AST              19   12/06/2013 NA              140   12/06/2013 K               3.5   12/06/2013 CL              100   12/06/2013 CREATININE     0.76   12/06/2013 BUN               6   12/06/2013 CO2              25   12/06/2013 TSH           3.454   10/04/2013 INR            0.93   12/03/2010 HGBA1C          5.9   10/04/2013   Hypertension  Diabetes  Hyperlipidemia     Medication List       This list is accurate as of: 01/07/14  3:18 PM.  Always use your most recent med list.               ALPRAZolam 1 MG tablet  Commonly known as:  XANAX  Take 1 mg by mouth 3 (three) times daily.     atorvastatin 80 MG tablet  Commonly known as:  LIPITOR  TAKE ONE TABLET BY MOUTH AT BEDTIME FOR  CHOLESTEROL     cetirizine 10 MG tablet  Commonly known as:  ZYRTEC  TAKE 1 TABLET BY MOUTH EVERY NIGHT AT BEDTIME     diazepam 5 MG tablet  Commonly known as:  VALIUM  Take 1 tablet (5 mg total) by mouth every 6 (six) hours as needed for anxiety.     DULoxetine 60 MG capsule  Commonly known as:  CYMBALTA  Take 60 mg by mouth daily.     glucose blood test strip  Commonly known as:  ONETOUCH VERIO  Use as instructed     losartan 50 MG  tablet  Commonly known as:  COZAAR  Take 50 mg by mouth daily at 6 PM.     Magnesium 250 MG Tabs  Take 250 mg by mouth daily at 6 PM.     meclizine 25 MG tablet  Commonly known as:  ANTIVERT  Take 1 tablet (25 mg total) by mouth 3 (three) times daily as needed for dizziness.     metFORMIN 500 MG 24 hr tablet  Commonly known as:  GLUCOPHAGE-XR  Take 500 mg by mouth 2 (two) times daily.     NAMENDA XR 28 MG Cp24  Generic drug:  Memantine HCl ER  Take 28 mg by mouth daily at 6 PM.     ONE TOUCH LANCETS Misc  Use as directed to check glucose daily.     ONETOUCH VERIO W/DEVICE Kit  1 kit by Does not apply route as needed. Use to check glucose QD     QUEtiapine 300 MG tablet  Commonly known as:  SEROQUEL  Take 300 mg by mouth at bedtime.     rOPINIRole 1 MG tablet  Commonly known as:  REQUIP  TAKE ONE TABLET BY MOUTH AT BEDTIME     traMADol 50 MG tablet  Commonly known as:  ULTRAM  Take by mouth every 6 (six) hours as needed.     VITAMIN B 12 PO  Take 1,000 mg by mouth daily.     VITAMIN D PO  Take 2,000-3,000 Int'l Units by mouth daily.       Allergies  Allergen Reactions  . Penicillins Rash   Past Medical History  Diagnosis Date  . Diabetes mellitus   . Type II or unspecified type diabetes mellitus without mention of complication, not stated as uncontrolled   . Depression   . Hypertension   . Dementia   . Vitamin D deficiency   . Thyroid disease     Review of Systems  Skin: Positive for color change.  All other systems reviewed and are negative.  BP 122/70  Pulse 102  Temp(Src) 98 F (36.7 C) (Temporal)  Resp 16  Ht 5' 2.75" (1.594 m)  Wt 142 lb (64.411 kg)  BMI 25.35 kg/m2     Objective:   Physical Exam  Nursing note and vitals reviewed. Constitutional: She is oriented to person, place, and time. She appears well-developed and well-nourished. No distress.  HENT:  Head: Normocephalic and atraumatic.  Right Ear: External ear normal.  Left  Ear: External ear normal.  Nose: Nose normal.  Mouth/Throat: Oropharynx is clear and moist.  Eyes: Conjunctivae and EOM are normal.  Neck: Normal range of motion. Neck supple. No JVD present. No thyromegaly present.  Cardiovascular: Normal rate, regular rhythm, normal heart sounds and intact distal pulses.   Pulmonary/Chest: Effort normal and breath sounds normal.  Abdominal: Soft. Bowel sounds are normal. She exhibits no distension. There is no tenderness.  Musculoskeletal: Normal range of motion. She exhibits no edema and no tenderness.  Lymphadenopathy:    She has no cervical adenopathy.  Neurological: She is alert and oriented to person, place, and time. No cranial nerve deficit.  Skin: Skin is warm and dry. No rash noted. There is erythema. No pallor.  Splotchy rash Medial/ anterior aspect of Left LE with mild edema  Psychiatric: She has a normal mood and affect. Her behavior is normal. Judgment and thought content normal.  Assessment & Plan:  1.  3 month F/U for HTN, Cholesterol,DM, D. Deficient. Needs healthy diet, cardio QD and obtain healthy weight. Check Labs, Check BP if >130/80 call office, Check BS if >200 call office   2.? Cellulitis- Doxy 100 AD, Elevate leg call with results, hygiene explained

## 2014-01-07 NOTE — Patient Instructions (Signed)
Cellulitis °Cellulitis is an infection of the skin and the tissue under the skin. The infected area is usually red and tender. This happens most often in the arms and lower legs. °HOME CARE  °· Take your antibiotic medicine as told. Finish the medicine even if you start to feel better. °· Keep the infected arm or leg raised (elevated). °· Put a warm cloth on the area up to 4 times per day. °· Only take medicines as told by your doctor. °· Keep all doctor visits as told. °GET HELP RIGHT AWAY IF:  °· You have a fever. °· You feel very sleepy. °· You throw up (vomit) or have watery poop (diarrhea). °· You feel sick and have muscle aches and pains. °· You see red streaks on the skin coming from the infected area. °· Your red area gets bigger or turns a dark color. °· Your bone or joint under the infected area is painful after the skin heals. °· Your infection comes back in the same area or different area. °· You have a puffy (swollen) bump in the infected area. °· You have new symptoms. °MAKE SURE YOU:  °· Understand these instructions. °· Will watch your condition. °· Will get help right away if you are not doing well or get worse. °Document Released: 11/30/2007 Document Revised: 12/13/2011 Document Reviewed: 08/29/2011 °Exit[employer]® Patient Information ©2015 [employer]. This information is not intended to replace advice given to you by your health care provider. Make sure you discuss any questions you have with your health care provider. ° °

## 2014-01-08 LAB — BASIC METABOLIC PANEL WITH GFR
BUN: 7 mg/dL (ref 6–23)
CHLORIDE: 103 meq/L (ref 96–112)
CO2: 30 mEq/L (ref 19–32)
Calcium: 9.1 mg/dL (ref 8.4–10.5)
Creat: 0.77 mg/dL (ref 0.50–1.10)
GFR, Est Non African American: 83 mL/min
Glucose, Bld: 103 mg/dL — ABNORMAL HIGH (ref 70–99)
POTASSIUM: 4.6 meq/L (ref 3.5–5.3)
SODIUM: 141 meq/L (ref 135–145)

## 2014-01-08 LAB — HEPATIC FUNCTION PANEL
ALK PHOS: 70 U/L (ref 39–117)
ALT: 12 U/L (ref 0–35)
AST: 15 U/L (ref 0–37)
Albumin: 3.8 g/dL (ref 3.5–5.2)
BILIRUBIN DIRECT: 0.1 mg/dL (ref 0.0–0.3)
Indirect Bilirubin: 0.4 mg/dL (ref 0.2–1.2)
Total Bilirubin: 0.5 mg/dL (ref 0.2–1.2)
Total Protein: 5.9 g/dL — ABNORMAL LOW (ref 6.0–8.3)

## 2014-01-08 LAB — LIPID PANEL
CHOL/HDL RATIO: 3.2 ratio
CHOLESTEROL: 136 mg/dL (ref 0–200)
HDL: 43 mg/dL (ref >39)
LDL CALC: 60 mg/dL (ref 0–99)
Triglycerides: 166 mg/dL — ABNORMAL HIGH (ref <150)
VLDL: 33 mg/dL (ref 0–40)

## 2014-01-14 ENCOUNTER — Other Ambulatory Visit: Payer: Self-pay | Admitting: Internal Medicine

## 2014-02-11 ENCOUNTER — Other Ambulatory Visit: Payer: Self-pay | Admitting: Physician Assistant

## 2014-02-14 ENCOUNTER — Other Ambulatory Visit: Payer: Self-pay | Admitting: Emergency Medicine

## 2014-02-20 ENCOUNTER — Other Ambulatory Visit: Payer: Self-pay | Admitting: Emergency Medicine

## 2014-02-20 ENCOUNTER — Other Ambulatory Visit: Payer: Self-pay | Admitting: Internal Medicine

## 2014-02-27 ENCOUNTER — Other Ambulatory Visit: Payer: Self-pay | Admitting: Internal Medicine

## 2014-03-17 ENCOUNTER — Encounter: Payer: Self-pay | Admitting: Internal Medicine

## 2014-04-18 ENCOUNTER — Other Ambulatory Visit: Payer: Self-pay | Admitting: Emergency Medicine

## 2014-04-21 ENCOUNTER — Emergency Department (HOSPITAL_COMMUNITY)
Admission: EM | Admit: 2014-04-21 | Discharge: 2014-04-21 | Discharge status: Against Medical Advice | Payer: BC Managed Care – PPO | Attending: Emergency Medicine | Admitting: Emergency Medicine

## 2014-04-21 ENCOUNTER — Encounter (HOSPITAL_COMMUNITY): Payer: Self-pay | Admitting: Emergency Medicine

## 2014-04-21 DIAGNOSIS — Y9389 Activity, other specified: Secondary | ICD-10-CM | POA: Insufficient documentation

## 2014-04-21 DIAGNOSIS — Y9289 Other specified places as the place of occurrence of the external cause: Secondary | ICD-10-CM | POA: Insufficient documentation

## 2014-04-21 DIAGNOSIS — S0093XA Contusion of unspecified part of head, initial encounter: Secondary | ICD-10-CM | POA: Diagnosis not present

## 2014-04-21 DIAGNOSIS — F0390 Unspecified dementia without behavioral disturbance: Secondary | ICD-10-CM | POA: Insufficient documentation

## 2014-04-21 DIAGNOSIS — W1830XA Fall on same level, unspecified, initial encounter: Secondary | ICD-10-CM | POA: Insufficient documentation

## 2014-04-21 DIAGNOSIS — E119 Type 2 diabetes mellitus without complications: Secondary | ICD-10-CM | POA: Diagnosis not present

## 2014-04-21 DIAGNOSIS — S8991XA Unspecified injury of right lower leg, initial encounter: Secondary | ICD-10-CM | POA: Diagnosis not present

## 2014-04-21 DIAGNOSIS — S0990XA Unspecified injury of head, initial encounter: Secondary | ICD-10-CM | POA: Diagnosis present

## 2014-04-21 DIAGNOSIS — I10 Essential (primary) hypertension: Secondary | ICD-10-CM | POA: Diagnosis not present

## 2014-04-21 LAB — CBG MONITORING, ED: GLUCOSE-CAPILLARY: 141 mg/dL — AB (ref 70–99)

## 2014-04-21 NOTE — ED Notes (Addendum)
Per pt slip and fall this am . sts hasn't eatin today and felt a little lightheaded.  Pt is a diabetic. Pt hit right side of head. Hematoma noted. sts head pain and pain to left knee. CBG 141

## 2014-04-21 NOTE — ED Notes (Signed)
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

## 2014-04-23 ENCOUNTER — Other Ambulatory Visit: Payer: Self-pay | Admitting: Physician Assistant

## 2014-04-28 ENCOUNTER — Encounter: Payer: Self-pay | Admitting: Internal Medicine

## 2014-04-28 ENCOUNTER — Other Ambulatory Visit: Payer: Self-pay | Admitting: *Deleted

## 2014-04-28 ENCOUNTER — Ambulatory Visit (INDEPENDENT_AMBULATORY_CARE_PROVIDER_SITE_OTHER): Payer: Medicare Other | Admitting: Internal Medicine

## 2014-04-28 VITALS — BP 106/66 | HR 100 | Temp 97.5°F | Resp 16 | Ht 65.0 in | Wt 145.8 lb

## 2014-04-28 DIAGNOSIS — Z0001 Encounter for general adult medical examination with abnormal findings: Secondary | ICD-10-CM

## 2014-04-28 DIAGNOSIS — Z23 Encounter for immunization: Secondary | ICD-10-CM

## 2014-04-28 DIAGNOSIS — E119 Type 2 diabetes mellitus without complications: Secondary | ICD-10-CM

## 2014-04-28 DIAGNOSIS — R7989 Other specified abnormal findings of blood chemistry: Secondary | ICD-10-CM

## 2014-04-28 DIAGNOSIS — F329 Major depressive disorder, single episode, unspecified: Secondary | ICD-10-CM

## 2014-04-28 DIAGNOSIS — Z9181 History of falling: Secondary | ICD-10-CM

## 2014-04-28 DIAGNOSIS — Z1212 Encounter for screening for malignant neoplasm of rectum: Secondary | ICD-10-CM

## 2014-04-28 DIAGNOSIS — Z79899 Other long term (current) drug therapy: Secondary | ICD-10-CM | POA: Insufficient documentation

## 2014-04-28 DIAGNOSIS — E782 Mixed hyperlipidemia: Secondary | ICD-10-CM

## 2014-04-28 DIAGNOSIS — Z515 Encounter for palliative care: Secondary | ICD-10-CM | POA: Insufficient documentation

## 2014-04-28 DIAGNOSIS — R945 Abnormal results of liver function studies: Secondary | ICD-10-CM

## 2014-04-28 DIAGNOSIS — F32A Depression, unspecified: Secondary | ICD-10-CM

## 2014-04-28 DIAGNOSIS — Z113 Encounter for screening for infections with a predominantly sexual mode of transmission: Secondary | ICD-10-CM

## 2014-04-28 DIAGNOSIS — E559 Vitamin D deficiency, unspecified: Secondary | ICD-10-CM

## 2014-04-28 DIAGNOSIS — I10 Essential (primary) hypertension: Secondary | ICD-10-CM

## 2014-04-28 DIAGNOSIS — R6889 Other general symptoms and signs: Secondary | ICD-10-CM

## 2014-04-28 LAB — CBC WITH DIFFERENTIAL/PLATELET
BASOS PCT: 0 % (ref 0–1)
Basophils Absolute: 0 10*3/uL (ref 0.0–0.1)
EOS ABS: 0 10*3/uL (ref 0.0–0.7)
EOS PCT: 1 % (ref 0–5)
HEMATOCRIT: 34.2 % — AB (ref 36.0–46.0)
HEMOGLOBIN: 11.8 g/dL — AB (ref 12.0–15.0)
LYMPHS ABS: 1.5 10*3/uL (ref 0.7–4.0)
Lymphocytes Relative: 31 % (ref 12–46)
MCH: 29.4 pg (ref 26.0–34.0)
MCHC: 34.5 g/dL (ref 30.0–36.0)
MCV: 85.3 fL (ref 78.0–100.0)
MONO ABS: 0.4 10*3/uL (ref 0.1–1.0)
MONOS PCT: 8 % (ref 3–12)
Neutro Abs: 2.8 10*3/uL (ref 1.7–7.7)
Neutrophils Relative %: 60 % (ref 43–77)
Platelets: 314 10*3/uL (ref 150–400)
RBC: 4.01 MIL/uL (ref 3.87–5.11)
RDW: 13.7 % (ref 11.5–15.5)
WBC: 4.7 10*3/uL (ref 4.0–10.5)

## 2014-04-28 MED ORDER — GLUCOSE BLOOD VI STRP: ORAL_STRIP | Status: DC

## 2014-04-28 NOTE — Progress Notes (Signed)
Patient ID: Marie [name], female   DOB: 12-Dec-1950, 63 y.o.   MRN: [id number]  MEDICARE ANNUAL WELLNESS & PREVENTATIVE VISIT AND CPE  Assessment:   1. Encounter for general adult medical examination with abnormal findings  2. Essential hypertension  - Microalbumin / creatinine urine ratio - EKG 12-Lead - [place], RETROPERITNL ABD,  LTD - TSH  3. Mixed hyperlipidemia  - Lipid panel  4. Vitamin D deficiency  - Vit D  25 hydroxy   5. Diabetes mellitus without complication  - HM DIABETES FOOT EXAM - LOW EXTREMITY NEUR EXAM DOCUM - Hemoglobin A1c - Insulin, fasting  6. Screening for rectal cancer  - POC Hemoccult Bld/Stl (3-Cd Home Screen); Future  7. At low risk for fall  8. Medication management  - Urine Microscopic - CBC with Differential - BASIC METABOLIC PANEL WITH GFR - Hepatic function panel - Magnesium  9. Abnormal LFTs  - Hepatitis A antibody, total - Hepatitis B core antibody, total - Hepatitis B e antibody - Hepatitis B surface antibody - Hepatitis C antibody  10. Screening for venereal disease (VD)  - HIV antibody - RPR  11. Need for prophylactic vaccination and inoculation against varicella  - Varicella-zoster vaccine subcutaneous  12. Depression  -controlled with meds  Plan:   During the course of the visit the patient was educated and counseled about appropriate screening and preventive services including:    Pneumococcal vaccine   Influenza vaccine  Td vaccine  Screening electrocardiogram  Bone densitometry screening  Colorectal cancer screening  Diabetes screening  Glaucoma screening  Nutrition counseling   Advanced directives: requested  Screening recommendations, referrals: Vaccinations: DT vaccine 2007 Influenza vaccine declined Pneumococcal vaccine 03/27/2000 Prevnar vaccine conflicts with Zostavax given today Shingles vaccine 04/28/2014 Hep B vaccine not indicated  Nutrition assessed and recommended   Colonoscopy 03/2012 Recommended yearly ophthalmology/optometry visit for glaucoma screening and checkup Recommended yearly dental visit for hygiene and checkup Advanced directives - recommended forms at checkout  Conditions/risks identified: BMI: Discussed weight loss, diet, and increase physical activity.  Increase physical activity: AHA recommends 150 minutes of physical activity a week.  Medications reviewed Diabetes is at goal, ACE/ARB therapy: Yes. Urinary Incontinence is not an issue: discussed non pharmacology and pharmacology options.  Fall risk: low- discussed PT, home fall assessment, medications.   Subjective:   Marie [name] is a 63 y.o. MWF who presents for Medicare Annual Wellness Visit and complete physical.  Date of last medicare wellness visit is unknown.  She has had elevated blood pressure since 2000. Her blood pressure has been controlled at home, & today their BP is BP: 106/66 mmHg She does workout. She denies chest pain, shortness of breath, dizziness.  She is on cholesterol medication and denies myalgias. Her cholesterol is at goal. The cholesterol last visit was:  Lab Results  Component Value Date   CHOL 136 01/07/2014   HDL 43 01/07/2014   LDLCALC 60 01/07/2014   TRIG 166* 01/07/2014   CHOLHDL 3.2 01/07/2014   She has had diabetes for 13years (2002). She has been working on diet and exercise for diabetes, CBG's range less than 110 mg% and patient denies foot ulcerations, hyperglycemia, hypoglycemia , nausea, paresthesia of the feet, polydipsia, polyuria and visual disturbances. Last A1C in the office was:  Lab Results  Component Value Date   HGBA1C 5.8* 01/07/2014   Patient is on Vitamin D supplement.  (Vit D 29 in 2008) Lab Results  Component Value Date   VD25OH 44  10/04/2013     Names of Other Physician/Practitioners you currently use: 1. [facility] here for primary care 2. Dr Alois Cliche , eye doctor, last  visit 1 month ago 3. 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 ALPRAZolam (XANAX) 1 MG tablet Take 1 mg by mouth 3 (three) times daily.    Marland Kitchen atorvastatin (LIPITOR) 80 MG tablet TAKE ONE TABLET BY MOUTH AT BEDTIME FOR  CHOLESTEROL 30 tablet 2  . Blood Glucose Monitoring Suppl (ONETOUCH VERIO) W/DEVICE KIT 1 kit by Does not apply route as needed. Use to check glucose QD 1 kit 0  . cetirizine (ZYRTEC) 10 MG tablet TAKE 1 TABLET BY MOUTH EVERY NIGHT AT BEDTIME 30 tablet 4  . Cholecalciferol (VITAMIN D PO) Take 2,000-3,000 Int'l Units by mouth daily.     . Cyanocobalamin (VITAMIN B 12 PO) Take 1,000 mg by mouth daily.     Marland Kitchen doxycycline (VIBRA-TABS) 100 MG tablet TAKE ONE TABLET BY MOUTH TWICE DAILY 20 tablet 0  . DULoxetine (CYMBALTA) 60 MG capsule Take 60 mg by mouth daily.    Marland Kitchen losartan (COZAAR) 50 MG tablet TAKE ONE TABLET BY MOUTH ONCE DAILY 30 tablet 6  . losartan (COZAAR) 50 MG tablet TAKE 1 TABLET BY MOUTH DAILY FOR BLOOD PRESSURE 30 tablet 0  . Magnesium 250 MG TABS Take 250 mg by mouth daily at 6 PM.    . metFORMIN (GLUCOPHAGE-XR) 500 MG 24 hr tablet TAKE 2 TABLETS BY MOUTH 2 TIMES A DAY AFTER MEALS 60 tablet 98  . NAMENDA XR 28 MG CP24 Take 28 mg by mouth daily at 6 PM.     . ONE TOUCH LANCETS MISC Use as directed to check glucose daily. 100 each 1  . QUEtiapine (SEROQUEL) 300 MG tablet TAKE 1 TABLET BY MOUTH EVERY NIGHT AT BEDTIME 30 tablet 99  . rOPINIRole (REQUIP) 1 MG tablet TAKE ONE TABLET BY MOUTH AT BEDTIME 30 tablet 3   No current facility-administered medications on file prior to visit.    Current Problems (verified) Patient Active Problem List   Diagnosis Date Noted  . Essential hypertension 04/28/2014  . Vitamin D  deficiency 04/28/2014  . Diabetes mellitus without complication 67/59/1638  . Recurrent scrotal infection 04/28/2014  . Medication management 04/28/2014  . Vitamin D Deficiency 10/04/2013  . 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 Depression   . Dementia   . Hypothyroidism   . RESTLESS LEGS SYNDROME 08/03/2007  . Hyperlipidemia 07/28/2007  . ALLERGIC RHINITIS 07/28/2007    Screening Tests Health Maintenance  Topic Date Due  . FOOT EXAM  11/13/1961  . URINE MICROALBUMIN  11/13/1961  . TETANUS/TDAP  11/14/1970  . MAMMOGRAM  11/13/2001  . PNEUMOCOCCAL POLYSACCHARIDE VACCINE (2) 03/27/2004  . ZOSTAVAX  11/14/2011  . INFLUENZA VACCINE  01/25/2014  . HEMOGLOBIN A1C  07/10/2014  . OPHTHALMOLOGY EXAM  11/22/2014  . PAP SMEAR  04/18/2015  . COLONOSCOPY  04/26/2019    Immunization History  Administered Date(s) Administered  . DT 11/25/2005  . Pneumococcal Polysaccharide-23 03/28/1999  . Zoster 04/28/2014   Preventative care: Last colonoscopy: 03/2012  Prior vaccinations:   Influenza: refused today Prevnar: deferred today Shingles/Zostavax: 04/28/2014  History reviewed: allergies, current medications, past  family history, past medical history, past social history, past surgical history and problem list  Risk Factors: Tobacco History  Substance Use Topics  . Smoking status: Never Smoker   . Smokeless tobacco: Never Used  . Alcohol Use: No   She does not smoke.  Patient is not a former smoker. Are there smokers in your home (other than you)?  No  Alcohol Current alcohol use: none  Caffeine Current caffeine use: coffee 1 /day  Exercise Current exercise: walking  Nutrition/Diet Current diet: in general, a "healthy" diet    Cardiac risk factors: diabetes mellitus, dyslipidemia, hypertension and sedentary lifestyle.  Depression Screen (Note: if answer to either of the following is "Yes", a more complete depression screening is indicated)   Q1: Over the  past two weeks, have you felt down, depressed or hopeless? No  Q2: Over the past two weeks, have you felt little interest or pleasure in doing things? No  Have you lost interest or pleasure in daily life? No  Do you often feel hopeless? No  Do you cry easily over simple problems? No  Activities of Daily Living In your present state of health, do you have any difficulty performing the following activities?:  Driving? No Managing money?  No Feeding yourself? No Getting from bed to chair? No Climbing a flight of stairs? No Preparing food and eating?: No Bathing or showering? No Getting dressed: No Getting to the toilet? No Using the toilet:No Moving around from place to place: No In the past year have you fallen or had a near fall?:Yes, slipped recently w/o significant injury.   Are you sexually active?  Yes  Do you have more than one partner?  No  Vision Difficulties: No  Hearing Difficulties: No Do you often ask people to speak up or repeat themselves? No Do you experience ringing or noises in your ears? No Do you have difficulty understanding soft or whispered voices? Sometimes.  Cognition  Do you feel that you have a problem with memory?No  Do you often misplace items? No  Do you feel safe at home?  Yes  Advanced directives Does patient have a [place]? No Does patient have a Living Will? No, forms offered   Objective:     Blood pressure 106/66, pulse 100, temp 97.5 F , resp. rate 16, ht 5' 5"  , wt 145 lb 12.8 oz . Body mass index is 24.26   General appearance: alert, no distress, WD/WN, female Cognitive Testing  Alert? Yes  Normal Appearance?Yes  Oriented to person? Yes  Place? Yes   Time? Yes  Recall of three objects?  Yes  Can perform simple calculations? Yes  Displays appropriate judgment? Yes  Can read the correct time from a watch/clock?Yes  HEENT: normocephalic, sclerae anicteric, TMs pearly, nares patent, no discharge or  erythema, pharynx normal Oral cavity: MMM, no lesions Neck: supple, no lymphadenopathy, no thyromegaly, no masses Heart: RRR, normal S1, S2, no murmurs Lungs: CTA bilaterally, no wheezes, rhonchi, or rales Abdomen: +bs, soft, non tender, non distended, no masses, no hepatomegaly, no splenomegaly Musculoskeletal: nontender, no swelling, no obvious deformity Extremities: no edema, no cyanosis, no clubbing Pulses: 2+ symmetric, upper and lower extremities, normal cap refill Neurological: alert, oriented x 3, CN2-12 intact, strength normal upper extremities and lower extremities, sensation normal throughout, DTRs 2+ throughout, no cerebellar signs, gait normal. Monofilament sensory testing normal. Psychiatric: normal affect, behavior normal, pleasant   Medicare Attestation I have personally reviewed: The patient's medical  and social history Their use of alcohol, tobacco or illicit drugs Their current medications and supplements The patient's functional ability including ADLs,fall risks, home safety risks, cognitive, and hearing and visual impairment Diet and physical activities Evidence for depression or mood disorders  The patient's weight, height, BMI, and visual acuity have been recorded in the chart.  I have made referrals, counseling, and provided education to the patient based on review of the above and I have provided the patient with a written personalized care plan for preventive services.    Mattox Schorr DAVID, MD   04/28/2014

## 2014-04-28 NOTE — Patient Instructions (Signed)
Recommend the book "The END of DIETING" by Dr Baker Janus   and the book "The END of DIABETES " by Dr Excell Seltzer  At [email or web address] - get book & Audio CD's      Being diabetic has a  300% increased risk for heart attack, stroke, cancer, and alzheimer- type vascular dementia. It is very important that you work harder with diet by avoiding all foods that are white except chicken & fish. Avoid white rice (brown & wild rice is OK), white potatoes (sweetpotatoes in moderation is OK), White bread or wheat bread or anything made out of white flour like bagels, donuts, rolls, buns, biscuits, cakes, pastries, cookies, pizza crust, and pasta (made from white flour & egg whites) - vegetarian pasta or spinach or wheat pasta is OK. Multigrain breads like Arnold's or [employer], or multigrain sandwich thins or flatbreads.  Diet, exercise and weight loss can reverse and cure diabetes in the early stages.  Diet, exercise and weight loss is very important in the control and prevention of complications of diabetes which affects every system in your body, ie. Brain - dementia/stroke, eyes - glaucoma/blindness, heart - heart attack/heart failure, kidneys - dialysis, stomach - gastric paralysis, intestines - malabsorption, nerves - severe painful neuritis, circulation - gangrene & loss of a leg(s), and finally cancer and Alzheimers.    I recommend avoid fried & greasy foods,  sweets/candy, white rice (brown or wild rice or Quinoa is OK), white potatoes (sweet potatoes are OK) - anything made from white flour - bagels, doughnuts, rolls, buns, biscuits,white and wheat breads, pizza crust and traditional pasta made of white flour & egg white(vegetarian pasta or spinach or wheat pasta is OK).  Multi-grain bread is OK - like multi-grain flat bread or sandwich thins. Avoid alcohol in excess. Exercise is also important.    Eat all the vegetables you want - avoid meat, especially red meat and dairy - especially cheese.  Cheese  is the most concentrated form of trans-fats which is the worst thing to clog up our arteries. Veggie cheese is OK which can be found in the fresh produce section at [employer] or [employer] or [employer]  Preventive Care for Adults A healthy lifestyle and preventive care can promote health and wellness. Preventive health guidelines for women include the following key practices.  A routine yearly physical is a good way to check with your health care provider about your health and preventive screening. It is a chance to share any concerns and updates on your health and to receive a thorough exam.  Visit your dentist for a routine exam and preventive care every 6 months. Brush your teeth twice a day and floss once a day. Good oral hygiene prevents tooth decay and gum disease.  The frequency of eye exams is based on your age, health, family medical history, use of contact lenses, and other factors. Follow your health care provider's recommendations for frequency of eye exams.  Eat a healthy diet. Foods like vegetables, fruits, whole grains, low-fat dairy products, and lean protein foods contain the nutrients you need without too many calories. Decrease your intake of foods high in solid fats, added sugars, and salt. Eat the right amount of calories for you.Get information about a proper diet from your health care provider, if necessary.  Regular physical exercise is one of the most important things you can do for your health. Most adults should get at least 150 minutes of moderate-intensity exercise (any activity that increases  your heart rate and causes you to sweat) each week. In addition, most adults need muscle-strengthening exercises on 2 or more days a week.  Maintain a healthy weight. The body mass index (BMI) is a screening tool to identify possible weight problems. It provides an estimate of body fat based on height and weight. Your health care provider can find your BMI and can help you  achieve or maintain a healthy weight.For adults 20 years and older:  A BMI below 18.5 is considered underweight.  A BMI of 18.5 to 24.9 is normal.  A BMI of 25 to 29.9 is considered overweight.  A BMI of 30 and above is considered obese.  Maintain normal blood lipids and cholesterol levels by exercising and minimizing your intake of saturated fat. Eat a balanced diet with plenty of fruit and vegetables. Blood tests for lipids and cholesterol should begin at age 38 and be repeated every 5 years. If your lipid or cholesterol levels are high, you are over 50, or you are at high risk for heart disease, you may need your cholesterol levels checked more frequently.Ongoing high lipid and cholesterol levels should be treated with medicines if diet and exercise are not working.  If you smoke, find out from your health care provider how to quit. If you do not use tobacco, do not start.  Lung cancer screening is recommended for adults aged 64-80 years who are at high risk for developing lung cancer because of a history of smoking. A yearly low-dose CT scan of the lungs is recommended for people who have at least a 30-pack-year history of smoking and are a current smoker or have quit within the past 15 years. A pack year of smoking is smoking an average of 1 pack of cigarettes a day for 1 year (for example: 1 pack a day for 30 years or 2 packs a day for 15 years). Yearly screening should continue until the smoker has stopped smoking for at least 15 years. Yearly screening should be stopped for people who develop a health problem that would prevent them from having lung cancer treatment.  High blood pressure causes heart disease and increases the risk of stroke. Your blood pressure should be checked at least every 1 to 2 years. Ongoing high blood pressure should be treated with medicines if weight loss and exercise do not work.  If you are 79-57 years old, ask your health care provider if you should take  aspirin to prevent strokes.  Diabetes screening involves taking a blood sample to check your fasting blood sugar level. This should be done once every 3 years, after age 37, if you are within normal weight and without risk factors for diabetes. Testing should be considered at a younger age or be carried out more frequently if you are overweight and have at least 1 risk factor for diabetes.  Breast cancer screening is essential preventive care for women. You should practice "breast self-awareness." This means understanding the normal appearance and feel of your breasts and may include breast self-examination. Any changes detected, no matter how small, should be reported to a health care provider. Women in their 76s and 30s should have a clinical breast exam (CBE) by a health care provider as part of a regular health exam every 1 to 3 years. After age 55, women should have a CBE every year. Starting at age 79, women should consider having a mammogram (breast X-ray test) every year. Women who have a family history of breast  cancer should talk to their health care provider about genetic screening. Women at a high risk of breast cancer should talk to their health care providers about having an MRI and a mammogram every year.  Breast cancer gene (BRCA)-related cancer risk assessment is recommended for women who have family members with BRCA-related cancers. BRCA-related cancers include breast, ovarian, tubal, and peritoneal cancers. Having family members with these cancers may be associated with an increased risk for harmful changes (mutations) in the breast cancer genes BRCA1 and BRCA2. Results of the assessment will determine the need for genetic counseling and BRCA1 and BRCA2 testing.  Routine pelvic exams to screen for cancer are no longer recommended for nonpregnant women who are considered low risk for cancer of the pelvic organs (ovaries, uterus, and vagina) and who do not have symptoms. Ask your health  care provider if a screening pelvic exam is right for you.  If you have had past treatment for cervical cancer or a condition that could lead to cancer, you need Pap tests and screening for cancer for at least 20 years after your treatment. If Pap tests have been discontinued, your risk factors (such as having a new sexual partner) need to be reassessed to determine if screening should be resumed. Some women have medical problems that increase the chance of getting cervical cancer. In these cases, your health care provider may recommend more frequent screening and Pap tests.  Colorectal cancer can be detected and often prevented. Most routine colorectal cancer screening begins at the age of 79 years and continues through age 110 years. However, your health care provider may recommend screening at an earlier age if you have risk factors for colon cancer. On a yearly basis, your health care provider may provide home test kits to check for hidden blood in the stool. Use of a small camera at the end of a tube, to directly examine the colon (sigmoidoscopy or colonoscopy), can detect the earliest forms of colorectal cancer. Talk to your health care provider about this at age 78, when routine screening begins. Direct exam of the colon should be repeated every 5-10 years through age 72 years, unless early forms of pre-cancerous polyps or small growths are found.  Hepatitis C blood testing is recommended for all people born from [birth year] through 1965 and any individual with known risks for hepatitis C.  Osteoporosis is a disease in which the bones lose minerals and strength with aging. This can result in serious bone fractures or breaks. The risk of osteoporosis can be identified using a bone density scan. Women ages 45 years and over and women at risk for fractures or osteoporosis should discuss screening with their health care providers. Ask your health care provider whether you should take a calcium supplement or  vitamin D to reduce the rate of osteoporosis.  Menopause can be associated with physical symptoms and risks. Hormone replacement therapy is available to decrease symptoms and risks. You should talk to your health care provider about whether hormone replacement therapy is right for you.  Use sunscreen. Apply sunscreen liberally and repeatedly throughout the day. You should seek shade when your shadow is shorter than you. Protect yourself by wearing long sleeves, pants, a wide-brimmed hat, and sunglasses year round, whenever you are outdoors.  Once a month, do a whole body skin exam, using a mirror to look at the skin on your back. Tell your health care provider of new moles, moles that have irregular borders, moles that are larger than  a pencil eraser, or moles that have changed in shape or color.  Stay current with required vaccines (immunizations).  Influenza vaccine. All adults should be immunized every year.  Tetanus, diphtheria, and acellular pertussis (Td, Tdap) vaccine. Pregnant women should receive 1 dose of Tdap vaccine during each pregnancy. The dose should be obtained regardless of the length of time since the last dose. Immunization is preferred during the 27th-36th week of gestation. An adult who has not previously received Tdap or who does not know her vaccine status should receive 1 dose of Tdap. This initial dose should be followed by tetanus and diphtheria toxoids (Td) booster doses every 10 years. Adults with an unknown or incomplete history of completing a 3-dose immunization series with Td-containing vaccines should begin or complete a primary immunization series including a Tdap dose. Adults should receive a Td booster every 10 years.  Zoster vaccine. One dose is recommended for adults aged 35 years or older unless certain conditions are present.  Measles, mumps, and rubella (MMR) vaccine. Adults born before [birth year] generally are considered immune to measles and mumps. Adults born in  50 or later should have 1 or more doses of MMR vaccine unless there is a contraindication to the vaccine or there is laboratory evidence of immunity to each of the three diseases. A routine second dose of MMR vaccine should be obtained at least 28 days after the first dose for students attending postsecondary schools, health care workers, or international travelers. People who received inactivated measles vaccine or an unknown type of measles vaccine during 1963-1967 should receive 2 doses of MMR vaccine. People who received inactivated mumps vaccine or an unknown type of mumps vaccine before 1979 and are at high risk for mumps infection should consider immunization with 2 doses of MMR vaccine. For females of childbearing age, rubella immunity should be determined. If there is no evidence of immunity, females who are not pregnant should be vaccinated. If there is no evidence of immunity, females who are pregnant should delay immunization until after pregnancy. Unvaccinated health care workers born before [birth year] who lack laboratory evidence of measles, mumps, or rubella immunity or laboratory confirmation of disease should consider measles and mumps immunization with 2 doses of MMR vaccine or rubella immunization with 1 dose of MMR vaccine.  Pneumococcal 13-valent conjugate (PCV13) vaccine. When indicated, a person who is uncertain of her immunization history and has no record of immunization should receive the PCV13 vaccine. An adult aged 52 years or older who has certain medical conditions and has not been previously immunized should receive 1 dose of PCV13 vaccine. This PCV13 should be followed with a dose of pneumococcal polysaccharide (PPSV23) vaccine. The PPSV23 vaccine dose should be obtained at least 8 weeks after the dose of PCV13 vaccine. An adult aged 67 years or older who has certain medical conditions and previously received 1 or more doses of PPSV23 vaccine should receive 1 dose of PCV13. The PCV13  vaccine dose should be obtained 1 or more years after the last PPSV23 vaccine dose.    Pneumococcal polysaccharide (PPSV23) vaccine. When PCV13 is also indicated, PCV13 should be obtained first. All adults aged 43 years and older should be immunized. An adult younger than age 60 years who has certain medical conditions should be immunized. Any person who resides in a nursing home or long-term care facility should be immunized. An adult smoker should be immunized. People with an immunocompromised condition and certain other conditions should receive both PCV13 and PPSV23  vaccines. People with human immunodeficiency virus (HIV) infection should be immunized as soon as possible after diagnosis. Immunization during chemotherapy or radiation therapy should be avoided. Routine use of PPSV23 vaccine is not recommended for American Indians, [place] Natives, or people younger than 65 years unless there are medical conditions that require PPSV23 vaccine. When indicated, people who have unknown immunization and have no record of immunization should receive PPSV23 vaccine. One-time revaccination 5 years after the first dose of PPSV23 is recommended for people aged 19-64 years who have chronic kidney failure, nephrotic syndrome, asplenia, or immunocompromised conditions. People who received 1-2 doses of PPSV23 before age 93 years should receive another dose of PPSV23 vaccine at age 82 years or later if at least 5 years have passed since the previous dose. Doses of PPSV23 are not needed for people immunized with PPSV23 at or after age 33 years.  Preventive Services / Frequency   Ages 43 to 91 years  Blood pressure check.  Lipid and cholesterol check.  Lung cancer screening. / Every year if you are aged 7-80 years and have a 30-pack-year history of smoking and currently smoke or have quit within the past 15 years. Yearly screening is stopped once you have quit smoking for at least 15 years or develop a health  problem that would prevent you from having lung cancer treatment.  Clinical breast exam.** / Every year after age 79 years.  BRCA-related cancer risk assessment.** / For women who have family members with a BRCA-related cancer (breast, ovarian, tubal, or peritoneal cancers).  Mammogram.** / Every year beginning at age 35 years and continuing for as long as you are in good health. Consult with your health care provider.  Pap test.** / Every 3 years starting at age 57 years through age 24 or 95 years with a history of 3 consecutive normal Pap tests.  HPV screening.** / Every 3 years from ages 26 years through ages 18 to 66 years with a history of 3 consecutive normal Pap tests.  Fecal occult blood test (FOBT) of stool. / Every year beginning at age 11 years and continuing until age 31 years. You may not need to do this test if you get a colonoscopy every 10 years.  Flexible sigmoidoscopy or colonoscopy.** / Every 5 years for a flexible sigmoidoscopy or every 10 years for a colonoscopy beginning at age 63 years and continuing until age 21 years.  Hepatitis C blood test.** / For all people born from [birth year] through 1964 and any individual with known risks for hepatitis C.  Skin self-exam. / Monthly.  Influenza vaccine. / Every year.  Tetanus, diphtheria, and acellular pertussis (Tdap/Td) vaccine.** / Consult your health care provider. Pregnant women should receive 1 dose of Tdap vaccine during each pregnancy. 1 dose of Td every 10 years.  Varicella vaccine.** / Consult your health care provider. Pregnant females who do not have evidence of immunity should receive the first dose after pregnancy.  Zoster vaccine.** / 1 dose for adults aged 73 years or older.  Pneumococcal 13-valent conjugate (PCV13) vaccine.** / Consult your health care provider.  Pneumococcal polysaccharide (PPSV23) vaccine.** / 1 to 2 doses if you smoke cigarettes or if you have certain conditions.  Meningococcal vaccine.**  / Consult your health care provider.  Hepatitis A vaccine.** / Consult your health care provider.  Hepatitis B vaccine.** / Consult your health care provider.

## 2014-04-29 ENCOUNTER — Other Ambulatory Visit: Payer: Self-pay | Admitting: Physician Assistant

## 2014-04-29 LAB — LIPID PANEL
CHOL/HDL RATIO: 3.5 ratio
Cholesterol: 129 mg/dL (ref 0–200)
HDL: 37 mg/dL — ABNORMAL LOW (ref >39)
LDL Cholesterol: 62 mg/dL (ref 0–99)
Triglycerides: 152 mg/dL — ABNORMAL HIGH (ref <150)
VLDL: 30 mg/dL (ref 0–40)

## 2014-04-29 LAB — HEPATIC FUNCTION PANEL
ALT: 9 U/L (ref 0–35)
AST: 21 U/L (ref 0–37)
Albumin: 4.4 g/dL (ref 3.5–5.2)
Alkaline Phosphatase: 59 U/L (ref 39–117)
Bilirubin, Direct: 0.2 mg/dL (ref 0.0–0.3)
Indirect Bilirubin: 0.8 mg/dL (ref 0.2–1.2)
TOTAL PROTEIN: 6.8 g/dL (ref 6.0–8.3)
Total Bilirubin: 1 mg/dL (ref 0.2–1.2)

## 2014-04-29 LAB — URINALYSIS, MICROSCOPIC ONLY
Bacteria, UA: NONE SEEN
Casts: NONE SEEN
Crystals: NONE SEEN

## 2014-04-29 LAB — BASIC METABOLIC PANEL WITH GFR
BUN: 14 mg/dL (ref 6–23)
CALCIUM: 9.2 mg/dL (ref 8.4–10.5)
CO2: 28 mEq/L (ref 19–32)
Chloride: 102 mEq/L (ref 96–112)
Creat: 0.87 mg/dL (ref 0.50–1.10)
GFR, EST AFRICAN AMERICAN: 83 mL/min
GFR, Est Non African American: 72 mL/min
GLUCOSE: 77 mg/dL (ref 70–99)
Potassium: 3.9 mEq/L (ref 3.5–5.3)
Sodium: 142 mEq/L (ref 135–145)

## 2014-04-29 LAB — HEPATITIS B SURFACE ANTIBODY,QUALITATIVE: Hep B S Ab: NEGATIVE

## 2014-04-29 LAB — HEPATITIS A ANTIBODY, TOTAL: HEP A TOTAL AB: NONREACTIVE

## 2014-04-29 LAB — INSULIN, FASTING: Insulin fasting, serum: 3.5 u[IU]/mL (ref 2.0–19.6)

## 2014-04-29 LAB — TSH: TSH: 1.411 u[IU]/mL (ref 0.350–4.500)

## 2014-04-29 LAB — MICROALBUMIN / CREATININE URINE RATIO
CREATININE, URINE: 245.5 mg/dL
MICROALB UR: 1.4 mg/dL (ref <2.0)
Microalb Creat Ratio: 5.7 mg/g (ref 0.0–30.0)

## 2014-04-29 LAB — RPR

## 2014-04-29 LAB — HIV ANTIBODY (ROUTINE TESTING W REFLEX): HIV 1&2 Ab, 4th Generation: NONREACTIVE

## 2014-04-29 LAB — HEMOGLOBIN A1C
HEMOGLOBIN A1C: 6.1 % — AB (ref <5.7)
Mean Plasma Glucose: 128 mg/dL — ABNORMAL HIGH (ref <117)

## 2014-04-29 LAB — MAGNESIUM: MAGNESIUM: 1.3 mg/dL — AB (ref 1.5–2.5)

## 2014-04-29 LAB — VITAMIN D 25 HYDROXY (VIT D DEFICIENCY, FRACTURES): VIT D 25 HYDROXY: 63 ng/mL (ref 30–89)

## 2014-04-29 LAB — HEPATITIS C ANTIBODY: HCV Ab: NEGATIVE

## 2014-04-29 LAB — HEPATITIS B CORE ANTIBODY, TOTAL: Hep B Core Total Ab: NONREACTIVE

## 2014-04-30 LAB — HEPATITIS B E ANTIBODY: HEPATITIS BE ANTIBODY: NONREACTIVE

## 2014-05-05 ENCOUNTER — Other Ambulatory Visit: Payer: Self-pay | Admitting: Internal Medicine

## 2014-05-26 ENCOUNTER — Other Ambulatory Visit: Payer: Self-pay | Admitting: *Deleted

## 2014-05-26 MED ORDER — LOSARTAN POTASSIUM 50 MG PO TABS: 50.0000 mg | ORAL_TABLET | Freq: Every day | ORAL | Status: DC

## 2014-05-31 ENCOUNTER — Encounter (HOSPITAL_COMMUNITY): Payer: Self-pay | Admitting: Emergency Medicine

## 2014-05-31 ENCOUNTER — Other Ambulatory Visit: Payer: Self-pay

## 2014-05-31 ENCOUNTER — Emergency Department (HOSPITAL_COMMUNITY)
Admission: EM | Admit: 2014-05-31 | Discharge: 2014-05-31 | Disposition: A | Discharge status: Home / Self Care | Payer: BC Managed Care – PPO | Attending: Emergency Medicine | Admitting: Emergency Medicine

## 2014-05-31 DIAGNOSIS — F13930 Sedative, hypnotic or anxiolytic use, unspecified with withdrawal, uncomplicated: Secondary | ICD-10-CM

## 2014-05-31 DIAGNOSIS — Z79899 Other long term (current) drug therapy: Secondary | ICD-10-CM | POA: Diagnosis not present

## 2014-05-31 DIAGNOSIS — F13230 Sedative, hypnotic or anxiolytic dependence with withdrawal, uncomplicated: Secondary | ICD-10-CM

## 2014-05-31 DIAGNOSIS — E119 Type 2 diabetes mellitus without complications: Secondary | ICD-10-CM | POA: Diagnosis not present

## 2014-05-31 DIAGNOSIS — R269 Unspecified abnormalities of gait and mobility: Secondary | ICD-10-CM | POA: Diagnosis present

## 2014-05-31 DIAGNOSIS — F0390 Unspecified dementia without behavioral disturbance: Secondary | ICD-10-CM | POA: Diagnosis not present

## 2014-05-31 DIAGNOSIS — N390 Urinary tract infection, site not specified: Secondary | ICD-10-CM | POA: Diagnosis not present

## 2014-05-31 DIAGNOSIS — F329 Major depressive disorder, single episode, unspecified: Secondary | ICD-10-CM | POA: Diagnosis not present

## 2014-05-31 LAB — CBC WITH DIFFERENTIAL/PLATELET
BASOS ABS: 0.1 10*3/uL (ref 0.0–0.1)
BASOS PCT: 1 % (ref 0–1)
EOS PCT: 1 % (ref 0–5)
Eosinophils Absolute: 0.1 10*3/uL (ref 0.0–0.7)
HCT: 34 % — ABNORMAL LOW (ref 36.0–46.0)
Hemoglobin: 11.5 g/dL — ABNORMAL LOW (ref 12.0–15.0)
Lymphocytes Relative: 26 % (ref 12–46)
Lymphs Abs: 2.2 10*3/uL (ref 0.7–4.0)
MCH: 29.6 pg (ref 26.0–34.0)
MCHC: 33.8 g/dL (ref 30.0–36.0)
MCV: 87.6 fL (ref 78.0–100.0)
MONO ABS: 0.9 10*3/uL (ref 0.1–1.0)
Monocytes Relative: 11 % (ref 3–12)
Neutro Abs: 5.4 10*3/uL (ref 1.7–7.7)
Neutrophils Relative %: 63 % (ref 43–77)
PLATELETS: 265 10*3/uL (ref 150–400)
RBC: 3.88 MIL/uL (ref 3.87–5.11)
RDW: 13.3 % (ref 11.5–15.5)
WBC: 8.6 10*3/uL (ref 4.0–10.5)

## 2014-05-31 LAB — RAPID URINE DRUG SCREEN, HOSP PERFORMED
Amphetamines: NOT DETECTED
Barbiturates: NOT DETECTED
Benzodiazepines: NOT DETECTED
Cocaine: NOT DETECTED
OPIATES: NOT DETECTED
TETRAHYDROCANNABINOL: NOT DETECTED

## 2014-05-31 LAB — I-STAT TROPONIN, ED: Troponin i, poc: 0 ng/mL (ref 0.00–0.08)

## 2014-05-31 LAB — URINALYSIS, ROUTINE W REFLEX MICROSCOPIC
BILIRUBIN URINE: NEGATIVE
Glucose, UA: NEGATIVE mg/dL
Hgb urine dipstick: NEGATIVE
Ketones, ur: NEGATIVE mg/dL
Nitrite: NEGATIVE
Protein, ur: NEGATIVE mg/dL
SPECIFIC GRAVITY, URINE: 1.015 (ref 1.005–1.030)
UROBILINOGEN UA: 0.2 mg/dL (ref 0.0–1.0)
pH: 5 (ref 5.0–8.0)

## 2014-05-31 LAB — COMPREHENSIVE METABOLIC PANEL
ALBUMIN: 3.6 g/dL (ref 3.5–5.2)
ALT: 6 U/L (ref 0–35)
AST: 13 U/L (ref 0–37)
Alkaline Phosphatase: 66 U/L (ref 39–117)
Anion gap: 17 — ABNORMAL HIGH (ref 5–15)
BUN: 10 mg/dL (ref 6–23)
CALCIUM: 9.1 mg/dL (ref 8.4–10.5)
CO2: 24 mEq/L (ref 19–32)
Chloride: 96 mEq/L (ref 96–112)
Creatinine, Ser: 0.85 mg/dL (ref 0.50–1.10)
GFR calc Af Amer: 83 mL/min — ABNORMAL LOW (ref >90)
GFR calc non Af Amer: 72 mL/min — ABNORMAL LOW (ref >90)
Glucose, Bld: 130 mg/dL — ABNORMAL HIGH (ref 70–99)
Potassium: 3.2 mEq/L — ABNORMAL LOW (ref 3.7–5.3)
Sodium: 137 mEq/L (ref 137–147)
Total Bilirubin: 0.5 mg/dL (ref 0.3–1.2)
Total Protein: 6.6 g/dL (ref 6.0–8.3)

## 2014-05-31 LAB — URINE MICROSCOPIC-ADD ON

## 2014-05-31 LAB — ETHANOL

## 2014-05-31 MED ORDER — NITROFURANTOIN MONOHYD MACRO 100 MG PO CAPS: 100.0000 mg | ORAL_CAPSULE | Freq: Two times a day (BID) | ORAL | Status: DC

## 2014-05-31 MED ORDER — NITROFURANTOIN MACROCRYSTAL 100 MG PO CAPS
100.0000 mg | ORAL_CAPSULE | Freq: Once | ORAL | Status: AC
  Administered 2014-05-31: 100 mg via ORAL
  Filled 2014-05-31: qty 1

## 2014-05-31 MED ORDER — ALPRAZOLAM 1 MG PO TABS: 1.0000 mg | ORAL_TABLET | Freq: Three times a day (TID) | ORAL | Status: DC

## 2014-05-31 MED ORDER — ALPRAZOLAM 0.25 MG PO TABS
1.0000 mg | ORAL_TABLET | Freq: Once | ORAL | Status: AC
  Administered 2014-05-31: 1 mg via ORAL

## 2014-05-31 NOTE — ED Notes (Signed)
Family at bedside. 

## 2014-05-31 NOTE — ED Notes (Signed)
Pt states she just woke up and doesn't feel right.  Denies pain, sob, nausea, or any other symptoms.

## 2014-05-31 NOTE — ED Provider Notes (Signed)
CSN: [id number]     Arrival date & time 05/31/14  0202 History  This chart was scribed for Marie Drape, MD by Jeanell Sparrow, ED Scribe. This patient was seen in room B15C/B15C and the patient's care was started at 3:23 AM.    Chief Complaint  Patient presents with  . Gait Problem  . slurred speech    The history is provided by the patient. No language interpreter was used.   HPI Comments: Marie [name] is a 63 y.o. female with a hx of dementia who presents to the Emergency Department complaining of dizziness that started yesterday.  She reports that she has "not been feeling right" since waking up around 1230 am. She states that last night she did not eat her dinner.  She states that she has also been feeling confused. She reports that she has been taking Xanax recently, but her last dose was two weeks ago. She reports that her hands are tingling. She denies any drug use. She denies any current dizziness, trouble swallowing, abdominal pain, or speech difficulty. Family reports she normally sleeps quite well after taking evening Seroquel, unclear if she took it tonight.  She denies any problems walking, no slurred speech. Pt repeats "I just don't feel well".  Family reports she has been out of her xanax for about 2 weeks, has appt tomorrow with her psychiatrist.  Pt dx with dementia after knee surgery a year ago  Past Medical History  Diagnosis Date  . Diabetes mellitus   . Type II or unspecified type diabetes mellitus without mention of complication, not stated as uncontrolled   . Depression   . Hypertension   . Dementia   . Vitamin D deficiency   . Thyroid disease    Past Surgical History  Procedure Laterality Date  . 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 Fracture surgery  2009    right arm; rod   Family History  Problem Relation Age of Onset  . Colon cancer Neg Hx   . Stomach cancer Neg Hx    History  Substance Use Topics  . Smoking status: Never Smoker   .  Smokeless tobacco: Never Used  . Alcohol Use: No   OB History    No data available     Review of Systems  Unable to perform ROS: Dementia  HENT: Negative for trouble swallowing.   Gastrointestinal: Negative for abdominal pain.  Neurological: Positive for dizziness. Negative for speech difficulty.  Psychiatric/Behavioral: Positive for confusion.  All other systems reviewed and are negative.   Allergies  Penicillins  Home Medications   Prior to Admission medications   Medication Sig Start Date End Date Taking? Authorizing Provider  ALPRAZolam [name]) 1 MG tablet Take 1 mg by mouth 3 (three) times daily.   Yes Historical Provider, MD  atorvastatin (LIPITOR) 80 MG tablet Take 80 mg by mouth daily.   Yes Historical Provider, MD  Cholecalciferol (VITAMIN D PO) Take 2,000-3,000 Int'l Units by mouth daily.    Yes Historical Provider, MD  Cyanocobalamin (VITAMIN B 12 PO) Take 1,000 mg by mouth daily.    Yes Historical Provider, MD  DULoxetine (CYMBALTA) 60 MG capsule Take 60 mg by mouth daily.   Yes Historical Provider, MD  losartan (COZAAR) 50 MG tablet Take 1 tablet (50 mg total) by mouth daily. for blood pressure 05/26/14  Yes [name], MD  Magnesium 250 MG TABS Take 250 mg by mouth daily at 6 PM.  Yes Historical Provider, MD  metFORMIN (GLUCOPHAGE-XR) 500 MG 24 hr tablet Take 1,000 mg by mouth 2 (two) times daily.   Yes Historical Provider, MD  NAMENDA XR 28 MG CP24 Take 28 mg by mouth daily at 6 PM.  06/25/13  Yes Historical Provider, MD  QUEtiapine (SEROQUEL) 300 MG tablet Take 300 mg by mouth at bedtime.   Yes Historical Provider, MD  rOPINIRole (REQUIP) 1 MG tablet Take 1 mg by mouth at bedtime.   Yes Historical Provider, MD  atorvastatin (LIPITOR) 80 MG tablet TAKE ONE TABLET BY MOUTH AT BEDTIME FOR  CHOLESTEROL 01/07/14   [name], MD  Blood Glucose Monitoring Suppl (ONETOUCH VERIO) W/DEVICE KIT 1 kit by Does not apply route as needed. Use to check glucose QD 12/30/13    [name], MD  cetirizine (ZYRTEC) 10 MG tablet TAKE 1 TABLET BY MOUTH EVERY NIGHT AT BEDTIME Patient not taking: Reported on 05/31/2014 12/23/13   [name], MD  doxycycline (VIBRA-TABS) 100 MG tablet TAKE ONE TABLET BY MOUTH TWICE DAILY Patient not taking: Reported on 05/31/2014 04/19/14   [name], MD  glucose blood [facility] VERIO) test strip Check blood glucose 1 time daily 04/28/14   [name], MD  metFORMIN (GLUCOPHAGE-XR) 500 MG 24 hr tablet TAKE 2 TABLETS BY MOUTH 2 TIMES A DAY AFTER MEALS 05/05/14   [name], MD  ONE [facility] LANCETS MISC Use as directed to check glucose daily. 12/30/13   [name], MD  QUEtiapine (SEROQUEL) 300 MG tablet TAKE 1 TABLET BY MOUTH EVERY NIGHT AT BEDTIME 02/14/14   [name], MD  QUEtiapine (SEROQUEL) 300 MG tablet TAKE 1 TABLET BY MOUTH EVERY NIGHT AT BEDTIME 04/29/14   [name], MD  rOPINIRole (REQUIP) 1 MG tablet TAKE ONE TABLET BY MOUTH AT BEDTIME 04/19/14   [name], MD   BP 128/60 mmHg  Pulse 96  Temp(Src) 97.9 F (36.6 C) (Oral)  Resp 14  Ht 5' 3"  (1.6 m)  Wt 150 lb (68.04 kg)  BMI 26.58 kg/m2  SpO2 100% Physical Exam  Constitutional: She is oriented to person, place, and time. She appears well-developed and well-nourished. No distress.  HENT:  Head: Normocephalic and atraumatic.  Nose: Nose normal.  Mouth/Throat: Oropharynx is clear and moist.  Eyes: Conjunctivae and EOM are normal. Pupils are equal, round, and reactive to light.  Neck: Normal range of motion. Neck supple. No JVD present. No tracheal deviation present. No thyromegaly present.  Cardiovascular: Normal rate, regular rhythm, normal heart sounds and intact distal pulses.  Exam reveals no gallop and no friction rub.   No murmur heard. Pulmonary/Chest: Effort normal and breath sounds normal. No stridor. No respiratory distress. She has no wheezes. She has no rales. She exhibits no tenderness.  Abdominal: Soft. Bowel sounds are  normal. She exhibits no distension and no mass. There is no tenderness. There is no rebound and no guarding.  Musculoskeletal: Normal range of motion. She exhibits no edema or tenderness.  Lymphadenopathy:    She has no cervical adenopathy.  Neurological: She is alert and oriented to person, place, and time. She displays normal reflexes. No cranial nerve deficit. She exhibits normal muscle tone. Coordination normal.  Confused, per family at her baseline  Skin: Skin is warm and dry. No rash noted. No erythema. No pallor.  Psychiatric: She has a normal mood and affect. Her behavior is normal. Judgment and thought content normal.  Nursing note and vitals reviewed.   ED Course  Procedures (including critical  care time) DIAGNOSTIC STUDIES: Oxygen Saturation is 100% on RA, normal by my interpretation.    COORDINATION OF CARE: 3:27 AM- Pt advised of plan for treatment which includes labs and pt agrees.  Labs Review Labs Reviewed  CBC WITH DIFFERENTIAL - Abnormal; Notable for the following:    Hemoglobin 11.5 (*)    HCT 34.0 (*)    All other components within normal limits  COMPREHENSIVE METABOLIC PANEL - Abnormal; Notable for the following:    Potassium 3.2 (*)    Glucose, Bld 130 (*)    GFR calc non Af Amer 72 (*)    GFR calc Af Amer 83 (*)    Anion gap 17 (*)    All other components within normal limits  URINALYSIS, ROUTINE W REFLEX MICROSCOPIC - Abnormal; Notable for the following:    APPearance CLOUDY (*)    Leukocytes, UA LARGE (*)    All other components within normal limits  URINE MICROSCOPIC-ADD ON - Abnormal; Notable for the following:    Squamous Epithelial / LPF FEW (*)    Bacteria, UA FEW (*)    Casts HYALINE CASTS (*)    All other components within normal limits  URINE CULTURE  ETHANOL  URINE RAPID DRUG SCREEN (HOSP PERFORMED)  I-STAT TROPOININ, ED    Imaging Review No results found.   EKG Interpretation None      Date: 05/31/2014  Rate: 117  Rhythm:  sinus tachycardia  QRS Axis: normal  Intervals: normal  ST/T Wave abnormalities: normal  Conduction Disutrbances:none  Narrative Interpretation:   Old EKG Reviewed: none available   MDM   Final diagnoses:  Benzodiazepine withdrawal, uncomplicated  UTI (lower urinary tract infection)    63 year old female who woke with vague complaints tonight.  Patient overall says that she does not feel right, and is having difficulties quantifying it.  Per family, she has been out of her Xanax for about 2 weeks with a planned follow-up in the morning with her psychiatrist.  No indication of CVA, no complaint of chest pain.  Will check labs and urine.  Physical exam is unremarkable.  I personally performed the services described in this documentation, which was scribed in my presence. The recorded information has been reviewed and is accurate.      Marie Drape, MD 05/31/14 [phone]

## 2014-05-31 NOTE — Discharge Instructions (Signed)
Benzodiazepine Withdrawal  °Benzodiazepines are a group of drugs that are prescribed for both short-term and long-term treatment of a variety of medical conditions. For some of these conditions, such as seizures and sudden and severe muscle spasms, they are used only for a few hours or a few days. For other conditions, such as anxiety, sleep problems, or frequent muscle spasms or to help prevent seizures, they are used for an extended period, usually weeks or months. °Benzodiazepines work by changing the way your brain functions. Normally, chemicals in your brain called neurotransmitters send messages between your brain cells. The neurotransmitter that benzodiazepines affect is called gamma-aminobutyric acid (GABA). GABA sends out messages that have a calming effect on many of the functions of your brain. Benzodiazepines make these messages stronger and increase this calming effect. °Short-term use of benzodiazepines usually does not cause problems when you stop taking the drugs. However, if you take benzodiazepines for a long time, your body can adjust to the drug and require more of it to produce the same effect (drug tolerance). Eventually, you can develop physical dependence on benzodiazepines, which is when you experience negative effects if your dosage of benzodiazepines is reduced or stopped too quickly. These negative effects are called symptoms of withdrawal. °SYMPTOMS °Symptoms of withdrawal may begin anytime within the first 10 days after you stop taking the benzodiazepine. They can last from several weeks up to a few months but usually are the worst between the first 10 to 14 days.  °The actual symptoms also vary, depending on the type of benzodiazepine you take. Possible symptoms include: °· Anxiety. °· Excitability. °· Irritability. °· Depression. °· Mood swings. °· Trouble sleeping. °· Confusion. °· Uncontrollable shaking (tremors). °· Muscle weakness. °· Seizures. °DIAGNOSIS °To diagnose  benzodiazepine withdrawal, your caregiver will examine you for certain signs, such as: °· Rapid heartbeat. °· Rapid breathing. °· Tremors. °· High blood pressure. °· Fever. °· Mood changes. °Your caregiver also may ask the following questions about your use of benzodiazepines: °· What type of benzodiazepine did you take? °· How much did you take each day? °· How long did you take the drug? °· When was the last time you took the drug? °· Do you take any other drugs? °· Have you had alcohol recently? °· Have you had a seizure recently? °· Have you lost consciousness recently? °· Have you had trouble remembering recent events? °· Have you had a recent increase in anxiety, irritability, or trouble sleeping? °A drug test also may be administered. °TREATMENT °The treatment for benzodiazepine withdrawal can vary, depending on the type and severity of your symptoms, what type of benzodiazepine you have been taking, and how long you have been taking the benzodiazepine. Sometimes it is necessary for you to be treated in a hospital, especially if you are at risk of seizures.  °Often, treatment includes a prescription for a long-acting benzodiazepine, the dosage of which is reduced slowly over a long period. This period could be several weeks or months. Eventually, your dosage will be reduced to a point that you can stop taking the drug, without experiencing withdrawal symptoms. This is called tapered withdrawal. Occasionally, minor symptoms of withdrawal continue for a few days or weeks after you have completed a tapered withdrawal. °SEEK IMMEDIATE MEDICAL CARE IF: °· You have a seizure. °· You develop a craving for drugs or alcohol. °· You begin to experience symptoms of withdrawal during your tapered withdrawal. °· You become very confused. °· You lose consciousness. °· You   have trouble breathing.  You think about hurting yourself or someone else. Document Released: 06/02/2011 Document Revised: 09/05/2011 Document  Reviewed: 06/02/2011 [facility] Patient Information 2015 [place], [place]. This information is not intended to replace advice given to you by your health care provider. Make sure you discuss any questions you have with your health care provider.   Urinary Tract Infection Urinary tract infections (UTIs) can develop anywhere along your urinary tract. Your urinary tract is your body's drainage system for removing wastes and extra water. Your urinary tract includes two kidneys, two ureters, a bladder, and a urethra. Your kidneys are a pair of bean-shaped organs. Each kidney is about the size of your fist. They are located below your ribs, one on each side of your spine. CAUSES Infections are caused by microbes, which are microscopic organisms, including fungi, viruses, and bacteria. These organisms are so small that they can only be seen through a microscope. Bacteria are the microbes that most commonly cause UTIs. SYMPTOMS  Symptoms of UTIs may vary by age and gender of the patient and by the location of the infection. Symptoms in young women typically include a frequent and intense urge to urinate and a painful, burning feeling in the bladder or urethra during urination. Older women and men are more likely to be tired, shaky, and weak and have muscle aches and abdominal pain. A fever may mean the infection is in your kidneys. Other symptoms of a kidney infection include pain in your back or sides below the ribs, nausea, and vomiting. DIAGNOSIS To diagnose a UTI, your caregiver will ask you about your symptoms. Your caregiver also will ask to provide a urine sample. The urine sample will be tested for bacteria and white blood cells. White blood cells are made by your body to help fight infection. TREATMENT  Typically, UTIs can be treated with medication. Because most UTIs are caused by a bacterial infection, they usually can be treated with the use of antibiotics. The choice of antibiotic and length of  treatment depend on your symptoms and the type of bacteria causing your infection. HOME CARE INSTRUCTIONS  If you were prescribed antibiotics, take them exactly as your caregiver instructs you. Finish the medication even if you feel better after you have only taken some of the medication.  Drink enough water and fluids to keep your urine clear or pale yellow.  Avoid caffeine, tea, and carbonated beverages. They tend to irritate your bladder.  Empty your bladder often. Avoid holding urine for long periods of time.  Empty your bladder before and after sexual intercourse.  After a bowel movement, women should cleanse from front to back. Use each tissue only once. SEEK MEDICAL CARE IF:   You have back pain.  You develop a fever.  Your symptoms do not begin to resolve within 3 days. SEEK IMMEDIATE MEDICAL CARE IF:   You have severe back pain or lower abdominal pain.  You develop chills.  You have nausea or vomiting.  You have continued burning or discomfort with urination. MAKE SURE YOU:   Understand these instructions.  Will watch your condition.  Will get help right away if you are not doing well or get worse. Document Released: 03/23/2005 Document Revised: 12/13/2011 Document Reviewed: 07/22/2011 [facility] Patient Information 2015 [place], [place]. This information is not intended to replace advice given to you by your health care provider. Make sure you discuss any questions you have with your health care provider.

## 2014-06-01 LAB — URINE CULTURE

## 2014-06-03 ENCOUNTER — Telehealth (HOSPITAL_COMMUNITY): Payer: Self-pay

## 2014-06-03 NOTE — Telephone Encounter (Signed)
Post ED Visit - Positive Culture Follow-up  Culture report reviewed by antimicrobial stewardship pharmacist: []  Wes Dulaney, Pharm.D., BCPS []  Heide Guile, Pharm.D., BCPS [x]  Alycia Rossetti, Pharm.D., BCPS []  [place], Pharm.D., BCPS, AAHIVP []  [name], Pharm.D., BCPS, AAHIVP []  Elicia Lamp, Pharm.D.   Positive Urine culture, >/= 100,000 colonies -> Group B Strep Treated with Nitrofurantoin, organism sensitive to the same and no further patient follow-up is required at this time.  [name] 06/03/2014, 5:33 AM

## 2014-06-10 ENCOUNTER — Other Ambulatory Visit: Payer: Self-pay | Admitting: *Deleted

## 2014-06-10 MED ORDER — ONETOUCH LANCETS MISC: Status: DC

## 2014-06-11 ENCOUNTER — Ambulatory Visit (INDEPENDENT_AMBULATORY_CARE_PROVIDER_SITE_OTHER): Payer: Medicare Other | Admitting: Internal Medicine

## 2014-06-11 ENCOUNTER — Encounter: Payer: Self-pay | Admitting: Internal Medicine

## 2014-06-11 VITALS — BP 116/80 | HR 92 | Temp 97.7°F | Resp 16 | Ht 65.0 in | Wt 147.8 lb

## 2014-06-11 DIAGNOSIS — E782 Mixed hyperlipidemia: Secondary | ICD-10-CM

## 2014-06-11 DIAGNOSIS — E1121 Type 2 diabetes mellitus with diabetic nephropathy: Secondary | ICD-10-CM

## 2014-06-11 DIAGNOSIS — I10 Essential (primary) hypertension: Secondary | ICD-10-CM

## 2014-06-11 NOTE — Progress Notes (Signed)
Subjective:    Patient ID: Marie [name], female    DOB: [birth year], 63 y.o.   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 Apparently she had an ER visit about 10 days ago with a  neg w/u. Patient apparently doing well remaining functional & independent and has been followed by Dr Toy Care and is felt stable. No hx/o auto accidents or difficulty driving or with directions.   Medication Sig  . ALPRAZolam (XANAX) 1 MG tablet Take 1 tablet (1 mg total) by mouth 3 (three) times daily.  Marland Kitchen atorvastatin (LIPITOR) 80 MG tablet TAKE ONE TABLET BY MOUTH AT BEDTIME FOR  CHOLESTEROL  . atorvastatin (LIPITOR) 80 MG tablet Take 80 mg by mouth daily.  . Blood Glucose Monitoring Suppl (ONETOUCH VERIO) W/DEVICE KIT 1 kit by Does not apply route as needed. Use to check glucose QD  . cetirizine (ZYRTEC) 10 MG tablet TAKE 1 TABLET BY MOUTH EVERY NIGHT AT BEDTIME (Patient not taking: Reported on 05/31/2014)  . Cholecalciferol (VITAMIN D PO) Take 2,000-3,000 Int'l Units by mouth daily.   . Cyanocobalamin (VITAMIN B 12 PO) Take 1,000 mg by mouth daily.   Marland Kitchen doxycycline (VIBRA-TABS) 100 MG tablet TAKE ONE TABLET BY MOUTH TWICE DAILY (Patient not taking: Reported on 05/31/2014)  . DULoxetine (CYMBALTA) 60 MG capsule Take 60 mg by mouth daily.  Marland Kitchen glucose blood (ONETOUCH VERIO) test strip Check blood glucose 1 time daily  . losartan (COZAAR) 50 MG tablet Take 1 tablet (50 mg total) by mouth daily. for blood pressure  . Magnesium 250 MG TABS Take 250 mg by mouth daily at 6 PM.  . metFORMIN (GLUCOPHAGE-XR) 500 MG 24 hr tablet TAKE 2 TABLETS BY MOUTH 2 TIMES A DAY AFTER MEALS  . metFORMIN (GLUCOPHAGE-XR) 500 MG 24 hr tablet Take 1,000 mg by mouth 2 (two) times daily.  Marland Kitchen NAMENDA XR 28 MG CP24 Take 28 mg by mouth daily at 6 PM.   . nitrofurantoin, macrocrystal-monohydrate, (MACROBID) 100 MG  capsule Take 1 capsule (100 mg total) by mouth 2 (two) times daily.  . ONE TOUCH LANCETS MISC Use as directed to check glucose daily.  . QUEtiapine (SEROQUEL) 300 MG tablet TAKE 1 TABLET BY MOUTH EVERY NIGHT AT BEDTIME  . QUEtiapine (SEROQUEL) 300 MG tablet TAKE 1 TABLET BY MOUTH EVERY NIGHT AT BEDTIME  . QUEtiapine (SEROQUEL) 300 MG tablet Take 300 mg by mouth at bedtime.  Marland Kitchen rOPINIRole (REQUIP) 1 MG tablet TAKE ONE TABLET BY MOUTH AT BEDTIME  . rOPINIRole (REQUIP) 1 MG tablet Take 1 mg by mouth at bedtime.   Allergies  Allergen Reactions  . Penicillins Rash   Past Medical History  Diagnosis Date  . Diabetes mellitus   . Type II or unspecified type diabetes mellitus without mention of complication, not stated as uncontrolled   . Depression   . Hypertension   . Dementia   . Vitamin D deficiency   . Thyroid disease    Past Surgical History  Procedure Laterality Date  . 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 Fracture surgery  2009    right arm; rod   Review of Systems  In addition to the HPI above,  No Fever-chills,  No Headache, No changes with Vision or hearing,  No problems swallowing food or Liquids,  No Chest pain or productive Cough  or Shortness of Breath,  No Abdominal pain, No Nausea or Vomitting, Bowel movements are regular,  No Blood in stool or Urine,  No dysuria,  No new skin rashes or bruises,  No new joints pains-aches,  No new weakness, tingling, numbness in any extremity,  No recent weight loss,  No polyuria, polydypsia or polyphagia,  No significant Mental Stressors.  A full 10 point Review of Systems was done, except as stated above, all other Review of Systems were negative    Objective:   Physical Exam   BP 116/80 mmHg  Pulse 92  Temp(Src) 97.7 F (36.5 C)  Resp 16  Ht 5' 5"  (1.651 m)  Wt 147 lb 12.8 oz (67.042 kg)  BMI 24.60 kg/m2  Appears neatly groomed. HEENT - Eac's patent. TM's Nl. EOM's full. PERRLA. NasoOroPharynx clear. Neck -  supple. Nl Thyroid. Carotids 2+ & No bruits, nodes, JVD Chest - Clear equal BS w/o Rales, rhonchi, wheezes. Cor - Nl HS. RRR w/o sig MGR. PP 1(+). No edema. Abd - No palpable organomegaly, masses or tenderness. BS nl. MS- FROM w/o deformities. Muscle power, tone and bulk Nl. Gait Nl. Neuro - No obvious Cr N abnormalities. Sensory, motor and Cerebellar functions appear Nl w/o focal abnormalities. Psyche - Mental status normal & appropriate.  No delusions, ideations or obvious mood abnormalities.    Assessment & Plan:   1. Essential hypertension   2. 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 - Continue same

## 2014-06-12 ENCOUNTER — Other Ambulatory Visit: Payer: Self-pay | Admitting: Internal Medicine

## 2014-06-12 ENCOUNTER — Other Ambulatory Visit: Payer: Self-pay | Admitting: Physician Assistant

## 2014-07-15 ENCOUNTER — Ambulatory Visit: Payer: Self-pay | Admitting: Internal Medicine

## 2014-07-16 ENCOUNTER — Ambulatory Visit (INDEPENDENT_AMBULATORY_CARE_PROVIDER_SITE_OTHER): Payer: Medicare Other | Admitting: Physician Assistant

## 2014-07-16 ENCOUNTER — Encounter: Payer: Self-pay | Admitting: Physician Assistant

## 2014-07-16 VITALS — BP 112/56 | HR 110 | Temp 98.0°F | Resp 18 | Ht 65.0 in | Wt 139.0 lb

## 2014-07-16 DIAGNOSIS — E1121 Type 2 diabetes mellitus with diabetic nephropathy: Secondary | ICD-10-CM | POA: Diagnosis not present

## 2014-07-16 DIAGNOSIS — Z79899 Other long term (current) drug therapy: Secondary | ICD-10-CM

## 2014-07-16 DIAGNOSIS — R634 Abnormal weight loss: Secondary | ICD-10-CM

## 2014-07-16 DIAGNOSIS — N390 Urinary tract infection, site not specified: Secondary | ICD-10-CM | POA: Diagnosis not present

## 2014-07-16 DIAGNOSIS — B309 Viral conjunctivitis, unspecified: Secondary | ICD-10-CM

## 2014-07-16 DIAGNOSIS — I10 Essential (primary) hypertension: Secondary | ICD-10-CM

## 2014-07-16 LAB — CBC WITH DIFFERENTIAL/PLATELET
BASOS PCT: 1 % (ref 0–1)
Basophils Absolute: 0.1 10*3/uL (ref 0.0–0.1)
Eosinophils Absolute: 0.1 10*3/uL (ref 0.0–0.7)
Eosinophils Relative: 1 % (ref 0–5)
HEMATOCRIT: 39 % (ref 36.0–46.0)
Hemoglobin: 13.3 g/dL (ref 12.0–15.0)
LYMPHS PCT: 20 % (ref 12–46)
Lymphs Abs: 1.7 10*3/uL (ref 0.7–4.0)
MCH: 30 pg (ref 26.0–34.0)
MCHC: 34.1 g/dL (ref 30.0–36.0)
MCV: 88 fL (ref 78.0–100.0)
MPV: 8.7 fL (ref 8.6–12.4)
Monocytes Absolute: 0.7 10*3/uL (ref 0.1–1.0)
Monocytes Relative: 8 % (ref 3–12)
NEUTROS ABS: 5.8 10*3/uL (ref 1.7–7.7)
Neutrophils Relative %: 70 % (ref 43–77)
PLATELETS: 292 10*3/uL (ref 150–400)
RBC: 4.43 MIL/uL (ref 3.87–5.11)
RDW: 13.7 % (ref 11.5–15.5)
WBC: 8.3 10*3/uL (ref 4.0–10.5)

## 2014-07-16 LAB — BASIC METABOLIC PANEL WITH GFR
BUN: 10 mg/dL (ref 6–23)
CHLORIDE: 102 meq/L (ref 96–112)
CO2: 28 mEq/L (ref 19–32)
Calcium: 9.6 mg/dL (ref 8.4–10.5)
Creat: 0.79 mg/dL (ref 0.50–1.10)
GFR, Est African American: >89 mL/min
GFR, Est Non African American: 80 mL/min
GLUCOSE: 104 mg/dL — AB (ref 70–99)
POTASSIUM: 3.7 meq/L (ref 3.5–5.3)
SODIUM: 142 meq/L (ref 135–145)

## 2014-07-16 LAB — HEPATIC FUNCTION PANEL
ALBUMIN: 4.1 g/dL (ref 3.5–5.2)
ALT: 16 U/L (ref 0–35)
AST: 17 U/L (ref 0–37)
Alkaline Phosphatase: 75 U/L (ref 39–117)
BILIRUBIN INDIRECT: 0.8 mg/dL (ref 0.2–1.2)
BILIRUBIN TOTAL: 1 mg/dL (ref 0.2–1.2)
Bilirubin, Direct: 0.2 mg/dL (ref 0.0–0.3)
TOTAL PROTEIN: 6.7 g/dL (ref 6.0–8.3)

## 2014-07-16 LAB — TSH: TSH: 1.327 u[IU]/mL (ref 0.350–4.500)

## 2014-07-16 MED ORDER — GLUCOSE BLOOD VI STRP: ORAL_STRIP | Status: DC

## 2014-07-16 NOTE — Patient Instructions (Addendum)
-  continue medications as prescribed. -Do NOT take the Cozaar right now.  Please monitor blood pressure at home.  Please check once a day and write down blood pressure and heart rate.  Please bring readings to next appointment.  We want below 140/90.  Please write down the date, time, blood pressure and heart rate.    Please use cold/warm compress for left eye.  If you notice discharge or crusting then please let me know.  Please keep your appt on 08/04/14  Conjunctivitis Conjunctivitis is commonly called "pink eye." Conjunctivitis can be caused by bacterial or viral infection, allergies, or injuries. There is usually redness of the lining of the eye, itching, discomfort, and sometimes discharge. There may be deposits of matter along the eyelids. A viral infection usually causes a watery discharge, while a bacterial infection causes a yellowish, thick discharge. Pink eye is very contagious and spreads by direct contact. You may be given antibiotic eyedrops as part of your treatment. Before using your eye medicine, remove all drainage from the eye by washing gently with warm water and cotton balls. Continue to use the medication until you have awakened 2 mornings in a row without discharge from the eye. Do not rub your eye. This increases the irritation and helps spread infection. Use separate towels from other household members. Wash your hands with soap and water before and after touching your eyes. Use cold compresses to reduce pain and sunglasses to relieve irritation from light. Do not wear contact lenses or wear eye makeup until the infection is gone. SEEK MEDICAL CARE IF:   Your symptoms are not better after 3 days of treatment.  You have increased pain or trouble seeing.  The outer eyelids become very red or swollen. Document Released: 07/21/2004 Document Revised: 09/05/2011 Document Reviewed: 06/13/2005 [facility] Patient Information 2015 [place], [place]. This information is not intended to  replace advice given to you by your health care provider. Make sure you discuss any questions you have with your health care provider.

## 2014-07-16 NOTE — Addendum Note (Signed)
Addended by: Charolette Forward on: 07/16/2014 01:37 PM   Modules accepted: Orders, Medications

## 2014-07-16 NOTE — Progress Notes (Addendum)
HPI  A Caucasian 64 y.o.female presents to the office today due to left red eye that started 2 days ago.  She states the sxs have stayed the same.  She reports glassy appearance to her left eye with redness and blurry vision.  Denies discharge, purulent discharge, crusting, itchiness and pain.  She does admit to vomiting a couple days ago after eating [place] food.   Patient has lack of appetite and has not been eating due to depression, anxiety and stress at home.  She states her daughter is now living with her and recently got caught stealing and is now going to jail.  Her granddaughter also lives with her and her husband and her cannot take care of her financially.  She has tried contacting the father and he states he cannot take care of his daughter due to not being able to control her.  Told patient that she needs to continue to see Dr. Toy Care (psychiatrist) and talk about these situations. Patient has been having a low blood pressure at home.  She has not been taking her Cozaar for the past 2 months.  About 2 months ago, she did take it and was having vertigo and lightheadedness.  Was recently seen in the ED on 05/31/14 for gait problem and slurred speech.  She did have a UTI during that time and was treated with antibiotics.  Will recheck urine today.  Review of Systems  Constitutional: Positive for weight loss. Negative for fever, chills, malaise/fatigue and diaphoresis.  HENT: Negative.  Negative for congestion, ear discharge, ear pain and sore throat.   Eyes: Positive for blurred vision and redness. Negative for pain and discharge.  Respiratory: Negative.  Negative for cough, sputum production, shortness of breath and wheezing.   Cardiovascular: Negative.  Negative for chest pain and leg swelling.  Gastrointestinal: Negative.  Negative for nausea, vomiting, abdominal pain, diarrhea, constipation, blood in stool and melena.  Genitourinary: Negative.  Negative for dysuria, urgency and frequency.   Musculoskeletal: Negative.   Skin: Negative.  Negative for rash.  Neurological: Negative.  Negative for dizziness, tingling and headaches.  Psychiatric/Behavioral: Positive for depression. The patient is nervous/anxious.        Stress from daughter moving in and being in trouble and gong to jail due to stealing.  Her granddaughter lives with her too and her father refuses to take her due to not being able to control her.   Past Medical History-  Past Medical History  Diagnosis Date  . Diabetes mellitus   . Type II or unspecified type diabetes mellitus without mention of complication, not stated as uncontrolled   . Depression   . Hypertension   . Dementia   . Vitamin D deficiency   . Thyroid disease    Medications-  Current Outpatient Prescriptions on File Prior to Visit  Medication Sig Dispense Refill  . ALPRAZolam (XANAX) 1 MG tablet Take 1 tablet (1 mg total) by mouth 3 (three) times daily. 12 tablet 0  . atorvastatin (LIPITOR) 80 MG tablet TAKE ONE TABLET BY MOUTH AT BEDTIME FOR  CHOLESTEROL 30 tablet 2  . Blood Glucose Monitoring Suppl (ONETOUCH VERIO) W/DEVICE KIT 1 kit by Does not apply route as needed. Use to check glucose QD 1 kit 0  . cetirizine (ZYRTEC) 10 MG tablet TAKE 1 TABLET BY MOUTH EVERY NIGHT AT BEDTIME 30 tablet 3  . Cholecalciferol (VITAMIN D PO) Take 2,000-3,000 Int'l Units by mouth daily.     . DULoxetine (CYMBALTA) 60 MG  capsule Take 60 mg by mouth daily.    Marland Kitchen glucose blood (ONETOUCH VERIO) test strip Check blood glucose 1 time daily 100 each 0  . Magnesium 250 MG TABS Take 250 mg by mouth daily at 6 PM.    . metFORMIN (GLUCOPHAGE-XR) 500 MG 24 hr tablet TAKE 2 TABLETS BY MOUTH 2 TIMES A DAY AFTER MEALS 120 tablet 3  . NAMENDA XR 28 MG CP24 Take 28 mg by mouth daily at 6 PM.     . ONE TOUCH LANCETS MISC Use as directed to check glucose daily. 100 each 1  . QUEtiapine (SEROQUEL) 300 MG tablet TAKE 1 TABLET BY MOUTH EVERY NIGHT AT BEDTIME 30 tablet 99  .  rOPINIRole (REQUIP) 1 MG tablet Take 1 mg by mouth at bedtime.    Marland Kitchen losartan (COZAAR) 50 MG tablet Take 1 tablet (50 mg total) by mouth daily. for blood pressure (Patient not taking: Reported on 07/16/2014) 30 tablet 2  . traMADol (ULTRAM) 50 MG tablet   0   No current facility-administered medications on file prior to visit.   Allergies-  Allergies  Allergen Reactions  . Penicillins Rash   Physical Exam BP 112/56 mmHg  Pulse 110  Temp(Src) 98 F (36.7 C) (Temporal)  Resp 18  Ht 5' 5"  (1.651 m)  Wt 139 lb (63.05 kg)  BMI 23.13 kg/m2  SpO2 98% BP Recheck- 110/78 Wt Readings from Last 3 Encounters:  07/16/14 139 lb (63.05 kg)  06/11/14 147 lb 12.8 oz (67.042 kg)  05/31/14 150 lb (68.04 kg)  Vitals Reviewed. General Appearance: Well nourished, in no apparent distress and had pleasant demeanor. Eyes:  PERRLA. EOMI. Left eye conjunctiva is erythematous, non-edematous, not yellow with no discharge or purulent discharge.  Right conjunctiva is pink without edema, erythema or yellowing.   Sinuses: No Frontal/maxillary tenderness Ears: No erythema, edema or tenderness on both external ear cartilages and ear canals.  TMs are intact bilaterally with normal light reflexes and without erythema, edema or bulging. Nose: Nose is symmetrical and turbinates are pink and not erythematous, edematous or  pale.    No polyps, tenderness or rhinorrhea. Throat: Oral pharynx is pink and moist. No erythema, edema or tenderness in pharynx or posterior pharynx Mucosa is intact and without lesions. Tonsils are at +1 station bilaterally and do not have exudate.    Uvula is midline and not swollen. Neck: Supple,  JVD,  carotid bruits,  LAD,  thyromegaly or thyroid masses. Trachea is midline.  Full range of motion in neck intact Respiratory: CTAB,  r/r/w or stridor. No increased effort of breathing. Cardio: RRR.   m/r/g.  S1S2nl.   Abdomen: Symmetrical, soft, nontender, and flat.  +BS nl x4.    Extremities:  C/C/E in upper and lower extremities. Pulses B/L +1  Skin: Warm, dry, intact without rashes, lesions, ecchymosis, yellowing, cyanosis.  Neuro: Alert and oriented X3, cooperative.  Mood and affect appropriate to situation.  CN II-XII grossly intact.   Psych: Insight and Judgment appropriate.   Assessment and Plan 1. Essential hypertension Stop taking Cozaar right now.  Please monitor BP at home daily and write down BP and HR with date and time.  Please bring readings to next appt.  2. Viral conjunctivitis Cold/warm compresses.  Continue Zyrtec OTC- 1 tablet daily. Told patient that if she notices discharge or crusting to call office and I can send in prescription.  3. UTI (lower urinary tract infection) Ordered labs to recheck urine due to recent UTI. 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 Loss of weight- stress? -Check TSH level  6. Encounter for long-term (current) use of medications Will monitor kidney and liver function. - CBC with Differential - BASIC METABOLIC PANEL WITH GFR - Hepatic function panel  Discussed medication effects and SE's.  Pt agreed to treatment plan. If you are not feeling better in 10-14 days, then please call the office. Please keep your follow up appt on 08/04/14.  Nga Rabon, Stephani Police, PA-C 11:44 AM [facility]

## 2014-07-17 LAB — URINALYSIS, MICROSCOPIC ONLY
Bacteria, UA: NONE SEEN
CASTS: NONE SEEN
CRYSTALS: NONE SEEN
SQUAMOUS EPITHELIAL / LPF: NONE SEEN

## 2014-07-17 LAB — URINALYSIS, ROUTINE W REFLEX MICROSCOPIC
Bilirubin Urine: NEGATIVE
Glucose, UA: NEGATIVE mg/dL
HGB URINE DIPSTICK: NEGATIVE
KETONES UR: NEGATIVE mg/dL
Nitrite: NEGATIVE
PH: 5 (ref 5.0–8.0)
Protein, ur: NEGATIVE mg/dL
SPECIFIC GRAVITY, URINE: 1.009 (ref 1.005–1.030)
UROBILINOGEN UA: 0.2 mg/dL (ref 0.0–1.0)

## 2014-07-18 LAB — URINE CULTURE
Colony Count: NO GROWTH
Organism ID, Bacteria: NO GROWTH

## 2014-08-04 ENCOUNTER — Ambulatory Visit: Payer: Self-pay | Admitting: Physician Assistant

## 2014-08-04 ENCOUNTER — Encounter: Payer: Self-pay | Admitting: Physician Assistant

## 2014-08-04 ENCOUNTER — Ambulatory Visit (INDEPENDENT_AMBULATORY_CARE_PROVIDER_SITE_OTHER): Payer: Medicare Other | Admitting: Physician Assistant

## 2014-08-04 VITALS — BP 122/78 | HR 84 | Temp 97.7°F | Resp 16 | Ht 65.0 in | Wt 137.0 lb

## 2014-08-04 DIAGNOSIS — E782 Mixed hyperlipidemia: Secondary | ICD-10-CM

## 2014-08-04 DIAGNOSIS — F32A Depression, unspecified: Secondary | ICD-10-CM

## 2014-08-04 DIAGNOSIS — E1121 Type 2 diabetes mellitus with diabetic nephropathy: Secondary | ICD-10-CM | POA: Diagnosis not present

## 2014-08-04 DIAGNOSIS — I10 Essential (primary) hypertension: Secondary | ICD-10-CM | POA: Diagnosis not present

## 2014-08-04 DIAGNOSIS — E559 Vitamin D deficiency, unspecified: Secondary | ICD-10-CM | POA: Diagnosis not present

## 2014-08-04 DIAGNOSIS — G2581 Restless legs syndrome: Secondary | ICD-10-CM

## 2014-08-04 DIAGNOSIS — F411 Generalized anxiety disorder: Secondary | ICD-10-CM | POA: Insufficient documentation

## 2014-08-04 DIAGNOSIS — Z1331 Encounter for screening for depression: Secondary | ICD-10-CM

## 2014-08-04 DIAGNOSIS — E039 Hypothyroidism, unspecified: Secondary | ICD-10-CM

## 2014-08-04 DIAGNOSIS — F329 Major depressive disorder, single episode, unspecified: Secondary | ICD-10-CM | POA: Diagnosis not present

## 2014-08-04 DIAGNOSIS — Z23 Encounter for immunization: Secondary | ICD-10-CM

## 2014-08-04 DIAGNOSIS — Z0001 Encounter for general adult medical examination with abnormal findings: Secondary | ICD-10-CM | POA: Diagnosis not present

## 2014-08-04 DIAGNOSIS — Z79899 Other long term (current) drug therapy: Secondary | ICD-10-CM

## 2014-08-04 DIAGNOSIS — R6889 Other general symptoms and signs: Secondary | ICD-10-CM | POA: Diagnosis not present

## 2014-08-04 DIAGNOSIS — J309 Allergic rhinitis, unspecified: Secondary | ICD-10-CM

## 2014-08-04 DIAGNOSIS — Z9181 History of falling: Secondary | ICD-10-CM

## 2014-08-04 DIAGNOSIS — N492 Inflammatory disorders of scrotum: Secondary | ICD-10-CM

## 2014-08-04 DIAGNOSIS — F0390 Unspecified dementia without behavioral disturbance: Secondary | ICD-10-CM

## 2014-08-04 LAB — BASIC METABOLIC PANEL WITH GFR
BUN: 6 mg/dL (ref 6–23)
CO2: 29 meq/L (ref 19–32)
Calcium: 9.2 mg/dL (ref 8.4–10.5)
Chloride: 103 mEq/L (ref 96–112)
Creat: 0.63 mg/dL (ref 0.50–1.10)
GFR, Est African American: >89 mL/min
GFR, Est Non African American: >89 mL/min
Glucose, Bld: 105 mg/dL — ABNORMAL HIGH (ref 70–99)
Potassium: 3.8 mEq/L (ref 3.5–5.3)
Sodium: 141 mEq/L (ref 135–145)

## 2014-08-04 LAB — CBC WITH DIFFERENTIAL/PLATELET
BASOS ABS: 0.1 10*3/uL (ref 0.0–0.1)
BASOS PCT: 1 % (ref 0–1)
EOS ABS: 0.1 10*3/uL (ref 0.0–0.7)
Eosinophils Relative: 1 % (ref 0–5)
HCT: 38.9 % (ref 36.0–46.0)
HEMOGLOBIN: 13.4 g/dL (ref 12.0–15.0)
Lymphocytes Relative: 24 % (ref 12–46)
Lymphs Abs: 1.3 10*3/uL (ref 0.7–4.0)
MCH: 29.5 pg (ref 26.0–34.0)
MCHC: 34.4 g/dL (ref 30.0–36.0)
MCV: 85.7 fL (ref 78.0–100.0)
MPV: 8.8 fL (ref 8.6–12.4)
Monocytes Absolute: 0.4 10*3/uL (ref 0.1–1.0)
Monocytes Relative: 7 % (ref 3–12)
Neutro Abs: 3.7 10*3/uL (ref 1.7–7.7)
Neutrophils Relative %: 67 % (ref 43–77)
Platelets: 312 10*3/uL (ref 150–400)
RBC: 4.54 MIL/uL (ref 3.87–5.11)
RDW: 13.9 % (ref 11.5–15.5)
WBC: 5.5 10*3/uL (ref 4.0–10.5)

## 2014-08-04 LAB — LIPID PANEL
CHOL/HDL RATIO: 3.4 ratio
CHOLESTEROL: 151 mg/dL (ref 0–200)
HDL: 45 mg/dL (ref >39)
LDL Cholesterol: 83 mg/dL (ref 0–99)
TRIGLYCERIDES: 114 mg/dL (ref <150)
VLDL: 23 mg/dL (ref 0–40)

## 2014-08-04 LAB — HEPATIC FUNCTION PANEL
ALT: 13 U/L (ref 0–35)
AST: 16 U/L (ref 0–37)
Albumin: 4 g/dL (ref 3.5–5.2)
Alkaline Phosphatase: 66 U/L (ref 39–117)
BILIRUBIN TOTAL: 1 mg/dL (ref 0.2–1.2)
Bilirubin, Direct: 0.2 mg/dL (ref 0.0–0.3)
Indirect Bilirubin: 0.8 mg/dL (ref 0.2–1.2)
TOTAL PROTEIN: 6.2 g/dL (ref 6.0–8.3)

## 2014-08-04 LAB — MAGNESIUM: Magnesium: 1.3 mg/dL — ABNORMAL LOW (ref 1.5–2.5)

## 2014-08-04 NOTE — Progress Notes (Signed)
MEDICARE ANNUAL WELLNESS VISIT AND FOLLOW UP  Assessment:   1. Essential hypertension - continue medications, DASH diet, exercise and monitor at home. Call if greater than 130/80.  - CBC with Differential/Platelet - BASIC METABOLIC PANEL WITH GFR - Hepatic function panel  2. Type 2 diabetes mellitus with diabetic nephropathy Discussed general issues about diabetes pathophysiology and management., Educational material distributed., Suggested low cholesterol diet., Encouraged aerobic exercise., Discussed foot care., Reminded to get yearly retinal exam. - Hemoglobin A1c - HM DIABETES FOOT EXAM  3. Hypothyroidism, unspecified hypothyroidism type - TSH  4. Dementia, without behavioral disturbance Continue namenda  5. Hyperlipidemia -continue medications, check lipids, decrease fatty foods, increase activity. - Lipid panel  6. RESTLESS LEGS SYNDROME better  7. Depression + screening, suggest following with Dr. Toy Care.   8. Vitamin D deficiency - Vit D  25 hydroxy (rtn osteoporosis monitoring)  9. Medication management - Magnesium  10. Allergic rhinitis, unspecified allergic rhinitis type Continue OTC allergy pills  11. Generalized anxiety disorder Anxiety- continue medications, stress management techniques discussed, increase water, good sleep hygiene discussed, increase exercise, and increase veggies.   12. Recurrent scrotal infection Remission  13. At high risk for falls Declines PT, long discussion about risk of falling  14. Encounter for general adult medical examination with abnormal findings  15.  Need for prophylactic vaccination against Streptococcus pneumoniae (pneumococcus) - Pneumococcal conjugate vaccine 13-valent IM   Plan:   During the course of the visit the patient was educated and counseled about appropriate screening and preventive services including:    Pneumococcal vaccine   Influenza vaccine  Td vaccine  Screening  electrocardiogram  Screening mammography  Bone densitometry screening  Colorectal cancer screening  Diabetes screening  Glaucoma screening  Nutrition counseling   Advanced directives: given info/requested  Screening recommendations, referrals:  Vaccinations: Please see documentation below and orders this visit.   Nutrition assessed and recommended  Colonoscopy up to date Mammogram requested Pap smear not indicated Pelvic exam not indicated Recommended yearly ophthalmology/optometry visit for glaucoma screening and checkup Recommended yearly dental visit for hygiene and checkup Advanced directives - requested  Conditions/risks identified: BMI: Discussed weight loss, diet, and increase physical activity.  Increase physical activity: AHA recommends 150 minutes of physical activity a week.  Medications reviewed DEXA- declined Diabetes is at goal, ACE/ARB therapy: No, Reason not on Ace Inhibitor/ARB therapy:  dizziness on losartan, likely due to hypotension Urinary Incontinence is not an issue: discussed non pharmacology and pharmacology options.  Fall risk: high- discussed PT, home fall assessment, medications.    Subjective:   Marie [name] is a 64 y.o. female who presents for Medicare Annual Wellness Visit and 3 month follow up on hypertension, prediabetes, hyperlipidemia, vitamin D def.  Date of last medicare wellness visit was 04/28/2014  Her blood pressure has been controlled at home, today their BP is BP: 122/78 mmHg She does not workout. She denies chest pain, shortness of breath, dizziness.  She is on cholesterol medication and denies myalgias. Her cholesterol is at goal. The cholesterol last visit was:   Lab Results  Component Value Date   CHOL 129 04/28/2014   HDL 37* 04/28/2014   LDLCALC 62 04/28/2014   TRIG 152* 04/28/2014   CHOLHDL 3.5 04/28/2014   She has been working on diet and exercise for prediabetes, and denies paresthesia of the feet,  polydipsia, polyuria and visual disturbances. Last A1C in the office was:  Lab Results  Component Value Date   HGBA1C 6.1* 04/28/2014  Patient is on Vitamin D supplement. Lab Results  Component Value Date   VD25OH 63 04/28/2014     She states her daughter is now living with her and recently got caught stealing and is now going to jail. Her granddaughter also lives with her and her husband and her cannot take care of her financially. She has tried contacting the father and he states he cannot take care of his daughter due to not being able to control her. Told patient that she needs to continue to see Dr. Toy Care (psychiatrist) and talk about these situations, has anxiety, only does 1-2 xanax a day.  Patient went to the ER for slurred speech/confusion in Dec- found to have UTI, treated.  She states that she fell about 6 months ago in June, still has ringing in her ears and had vertigo with taking a BP med. Had normal CT head in June.   Names of Other Physician/Practitioners you currently use: 1. [facility]- here for primary care 2. Dr. Idolina Primer, eye doctor, last visit 3 months ago 3. [facility], dentist, last visit 2012-2014  Patient Care Team: [name], MD as PCP - General (Internal Medicine) Rupinder Charm Rings, MD as Consulting Physician (Psychiatry)  Medication Review Current Outpatient Prescriptions on File Prior to Visit  Medication Sig Dispense Refill  . ALPRAZolam (XANAX) 1 MG tablet Take 1 tablet (1 mg total) by mouth 3 (three) times daily. 12 tablet 0  . atorvastatin (LIPITOR) 80 MG tablet TAKE ONE TABLET BY MOUTH AT BEDTIME FOR  CHOLESTEROL 30 tablet 2  . Blood Glucose Monitoring Suppl (ONETOUCH VERIO) W/DEVICE KIT 1 kit by Does not apply route as needed. Use to check glucose QD 1 kit 0  . cetirizine (ZYRTEC) 10 MG tablet TAKE 1 TABLET BY MOUTH EVERY NIGHT AT BEDTIME 30 tablet 3  . Cholecalciferol (VITAMIN D PO) Take 2,000-3,000 Int'l Units  by mouth daily.     . DULoxetine (CYMBALTA) 60 MG capsule Take 60 mg by mouth daily.    Marland Kitchen glucose blood (ONETOUCH VERIO) test strip Check blood glucose 1 time daily for fluctuating blood sugars. 100 each 0  . losartan (COZAAR) 50 MG tablet Take 1 tablet (50 mg total) by mouth daily. for blood pressure 30 tablet 2  . Magnesium 250 MG TABS Take 250 mg by mouth daily at 6 PM.    . metFORMIN (GLUCOPHAGE-XR) 500 MG 24 hr tablet TAKE 2 TABLETS BY MOUTH 2 TIMES A DAY AFTER MEALS 120 tablet 3  . NAMENDA XR 28 MG CP24 Take 28 mg by mouth daily at 6 PM.     . ONE TOUCH LANCETS MISC Use as directed to check glucose daily. 100 each 1  . QUEtiapine (SEROQUEL) 300 MG tablet TAKE 1 TABLET BY MOUTH EVERY NIGHT AT BEDTIME 30 tablet 99  . rOPINIRole (REQUIP) 1 MG tablet Take 1 mg by mouth at bedtime.     No current facility-administered medications on file prior to visit.    Current Problems (verified) Patient Active Problem List   Diagnosis Date Noted  . Essential hypertension 04/28/2014  . Diabetes mellitus without complication 32/35/5732  . Recurrent scrotal infection 04/28/2014  . Medication management 04/28/2014  . Vitamin D Deficiency 10/04/2013  . 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 Depression   . Dementia   . Hypothyroidism   . RESTLESS LEGS SYNDROME 08/03/2007  . Hyperlipidemia 07/28/2007  . ALLERGIC RHINITIS 07/28/2007    Screening Tests Health Maintenance  Topic Date Due  . TETANUS/TDAP  11/14/1970  . MAMMOGRAM  11/13/2001  . PNEUMOCOCCAL POLYSACCHARIDE VACCINE (2) 03/27/2004  . INFLUENZA VACCINE  01/25/2014  . HEMOGLOBIN A1C  10/27/2014  . OPHTHALMOLOGY EXAM  11/22/2014  . PAP SMEAR  04/18/2015  . FOOT EXAM  04/29/2015  . URINE MICROALBUMIN  04/29/2015  . COLONOSCOPY  04/26/2019  . ZOSTAVAX  Completed     Immunization History  Administered Date(s) Administered  . DT 11/25/2005  . Pneumococcal Polysaccharide-23 03/28/1999  . Zoster 04/28/2014    Preventative  care: Preventative care: Last colonoscopy: 03/2012 [facility] 2002 DUE CXR 01/2012 MRI brain 04/2013 PAP 2013  Prior vaccinations:  DT vaccine 2007 Influenza vaccine declined Pneumococcal vaccine 03/27/2000 Prevnar vaccine DUE Shingles vaccine 04/28/2014 Hep B vaccine not indicated  History reviewed: allergies, current medications, past family history, past medical history, past social history, past surgical history and problem list  Past Surgical History  Procedure Laterality Date  . 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 Fracture surgery  2009    right arm; rod   Family History  Problem Relation Age of Onset  . Colon cancer Neg Hx   . Stomach cancer Neg Hx    Risk Factors: Osteoporosis/FallRisk: postmenopausal estrogen deficiency and dietary calcium and/or vitamin D deficiency In the past year have you fallen or had a near fall?:Yes History of fracture in the past year: no  Tobacco History  Substance Use Topics  . Smoking status: Never Smoker   . Smokeless tobacco: Never Used  . Alcohol Use: No   She does not smoke.  Patient is not a former smoker. Are there smokers in your home (other than you)?  No  Alcohol Current alcohol use: none  Caffeine Current caffeine use: coffee 1 /day  Exercise Current exercise: none  Nutrition/Diet Current diet: in general, a "healthy" diet    Cardiac risk factors: advanced age (older than 84 for men, 39 for women), diabetes mellitus and dyslipidemia.  Depression Screen (Note: if answer to either of the following is "Yes", a more complete depression screening is indicated)   Q1: Over the past two weeks, have you felt down, depressed or hopeless? Yes  Q2: Over the past two weeks, have you felt little interest or pleasure in doing things? Yes  Have you lost interest or pleasure in daily life? No  Do you often feel hopeless? No  Do you cry easily over simple problems? No  Activities of Daily Living In your  present state of health, do you have any difficulty performing the following activities?:  Driving? Yes Managing money?  No Feeding yourself? No Getting from bed to chair? No Climbing a flight of stairs? No Preparing food and eating?: No Bathing or showering? No Getting dressed: No Getting to the toilet? No Using the toilet:No Moving around from place to place: No   Are you sexually active?  No  Do you have more than one partner?  No  Vision Difficulties: No  Hearing Difficulties: No Do you often ask people to speak up or repeat themselves? No Do you experience ringing or noises in your ears? Yes Do you have difficulty understanding soft or whispered voices? No  Cognition  Do you feel that you have a problem with memory?Yes  Do you often misplace items? Yes  Do you feel safe at home?  Yes  Advanced directives Does patient have a Jeff? No Does patient have a Living Will?  No   Objective:   Blood pressure 122/78, pulse 84, temperature 97.7 F (36.5 C), resp. rate 16, height _0  (1.651 m), weight 137 lb (62.143 kg). Body mass index is 22.8 kg/(m^2).  General appearance: alert, no distress, WD/WN,  female Cognitive Testing  Alert? Yes  Normal Appearance?Yes  Oriented to person? Yes  Place? Yes   Time? Yes  Recall of three objects?  2/3  Can perform simple calculations? Yes  Displays appropriate judgment?Yes  Can read the correct time from a watch face?Yes  HEENT: normocephalic, sclerae anicteric, TMs pearly, nares patent, no discharge or erythema, pharynx normal Oral cavity: MMM, no lesions Neck: supple, no lymphadenopathy, no thyromegaly, no masses Heart: RRR, normal S1, S2, no murmurs Lungs: CTA bilaterally, no wheezes, rhonchi, or rales Abdomen: +bs, soft, non tender, non distended, no masses, no hepatomegaly, no splenomegaly Musculoskeletal: nontender, no swelling, no obvious deformity Extremities: no edema, no cyanosis, no  clubbing Pulses: 2+ symmetric, upper and lower extremities, normal cap refill Neurological: alert, oriented x 3, CN2-12 intact, strength normal upper extremities and lower extremities, sensation normal throughout, DTRs 2+ throughout, no cerebellar signs, gait normal Psychiatric: normal affect, behavior normal, pleasant  Breast: defer Gyn: defer Rectal: defer  Medicare Attestation I have personally reviewed: The patient's medical and social history Their use of alcohol, tobacco or illicit drugs Their current medications and supplements The patient's functional ability including ADLs,fall risks, home safety risks, cognitive, and hearing and visual impairment Diet and physical activities Evidence for depression or mood disorders  The patient's weight, height, BMI, and visual acuity have been recorded in the chart.  I have made referrals, counseling, and provided education to the patient based on review of the above and I have provided the patient with a written personalized care plan for preventive services.     Vicie Mutters, PA-C   08/04/2014

## 2014-08-04 NOTE — Patient Instructions (Signed)
[employer] with no obligation # [phone] Tues-Sat 10-6  [place] with no obligation # [phone] Call for store hours  .   Bad carbs also include fruit juice, alcohol, and sweet tea. These are empty calories that do not signal to your brain that you are full.   Please remember the good carbs are still carbs which convert into sugar. So please measure them out no more than 1/2-1 cup of rice, oatmeal, pasta, and beans  Veggies are however free foods! Pile them on.   Not all fruit is created equal. Please see the list below, the fruit at the bottom is higher in sugars than the fruit at the top. Please avoid all dried fruits.

## 2014-08-05 LAB — HEMOGLOBIN A1C
HEMOGLOBIN A1C: 6.1 % — AB (ref <5.7)
Mean Plasma Glucose: 128 mg/dL — ABNORMAL HIGH (ref <117)

## 2014-08-05 LAB — VITAMIN D 25 HYDROXY (VIT D DEFICIENCY, FRACTURES): Vit D, 25-Hydroxy: 40 ng/mL (ref 30–100)

## 2014-08-05 LAB — TSH: TSH: 2.03 u[IU]/mL (ref 0.350–4.500)

## 2014-08-21 ENCOUNTER — Encounter: Payer: Self-pay | Admitting: Physician Assistant

## 2014-08-21 ENCOUNTER — Ambulatory Visit (INDEPENDENT_AMBULATORY_CARE_PROVIDER_SITE_OTHER): Payer: Medicare Other | Admitting: Physician Assistant

## 2014-08-21 VITALS — BP 128/68 | HR 90 | Temp 98.4°F | Resp 18 | Ht 65.0 in | Wt 140.0 lb

## 2014-08-21 DIAGNOSIS — L309 Dermatitis, unspecified: Secondary | ICD-10-CM

## 2014-08-21 DIAGNOSIS — H9313 Tinnitus, bilateral: Secondary | ICD-10-CM | POA: Diagnosis not present

## 2014-08-21 MED ORDER — GLUCOSE BLOOD VI STRP: ORAL_STRIP | Status: DC

## 2014-08-21 MED ORDER — CLOBETASOL PROPIONATE 0.05 % EX SOLN: 1.0000 "application " | Freq: Two times a day (BID) | CUTANEOUS | Status: DC

## 2014-08-21 NOTE — Patient Instructions (Signed)
[employer] with no obligation # [phone] Tues-Sat 10-6  [facility] with no obligation # [phone] Call for store hours   Your A1C is a measure of your sugar over the past 3 months and is not affected by what you have eaten over the past few days. Diabetes increases your chances of stroke and heart attack over 300 % and is the leading cause of blindness and kidney failure in the [place]. Please make sure you decrease bad carbs like white bread, white rice, potatoes, corn, soft drinks, pasta, cereals, refined sugars, sweet tea, dried fruits, and fruit juice. Good carbs are okay to eat in moderation like sweet potatoes, brown rice, whole grain pasta/bread, most fruit (except dried fruit) and you can eat as many veggies as you want.   Greater than 6.5 is considered diabetic. Between 6.4 and 5.7 is prediabetic If your A1C is less than 5.7 you are NOT diabetic.  Targets for Glucose Readings: Time of Check Target for patients WITHOUT Diabetes Target for DIABETICS  Before Meals Less than 100  less than 150  Two hours after meals Less than 200  Less than 250   Eczema Eczema, also called atopic dermatitis, is a skin disorder that causes inflammation of the skin. It causes a red rash and dry, scaly skin. The skin becomes very itchy. Eczema is generally worse during the cooler winter months and often improves with the warmth of summer. Eczema usually starts showing signs in infancy. Some children outgrow eczema, but it may last through adulthood.  CAUSES  The exact cause of eczema is not known, but it appears to run in families. People with eczema often have a family history of eczema, allergies, asthma, or hay fever. Eczema is not contagious. Flare-ups of the condition may be caused by:   Contact with something you are sensitive or allergic to.   Stress. SIGNS AND SYMPTOMS  Dry, scaly skin.   Red, itchy rash.   Itchiness. This may occur  before the skin rash and may be very intense.  DIAGNOSIS  The diagnosis of eczema is usually made based on symptoms and medical history. TREATMENT  Eczema cannot be cured, but symptoms usually can be controlled with treatment and other strategies. A treatment plan might include:  Controlling the itching and scratching.   Use over-the-counter antihistamines as directed for itching. This is especially useful at night when the itching tends to be worse.   Use over-the-counter steroid creams as directed for itching.   Avoid scratching. Scratching makes the rash and itching worse. It may also result in a skin infection (impetigo) due to a break in the skin caused by scratching.   Keeping the skin well moisturized with creams every day. This will seal in moisture and help prevent dryness. Lotions that contain alcohol and water should be avoided because they can dry the skin.   Limiting exposure to things that you are sensitive or allergic to (allergens).   Recognizing situations that cause stress.   Developing a plan to manage stress.  HOME CARE INSTRUCTIONS   Only take over-the-counter or prescription medicines as directed by your health care provider.   Do not use anything on the skin without checking with your health care provider.   Keep baths or showers short (5 minutes) in warm (not hot) water. Use mild cleansers for bathing. These should be unscented. You may add nonperfumed bath oil to the bath water. It is best to  avoid soap and bubble bath.   Immediately after a bath or shower, when the skin is still damp, apply a moisturizing ointment to the entire body. This ointment should be a petroleum ointment. This will seal in moisture and help prevent dryness. The thicker the ointment, the better. These should be unscented.   Keep fingernails cut short. Children with eczema may need to wear soft gloves or mittens at night after applying an ointment.   Dress in clothes made  of cotton or cotton blends. Dress lightly, because heat increases itching.   A child with eczema should stay away from anyone with fever blisters or cold sores. The virus that causes fever blisters (herpes simplex) can cause a serious skin infection in children with eczema. SEEK MEDICAL CARE IF:   Your itching interferes with sleep.   Your rash gets worse or is not better within 1 week after starting treatment.   You see pus or soft yellow scabs in the rash area.   You have a fever.   You have a rash flare-up after contact with someone who has fever blisters.  Document Released: 06/10/2000 Document Revised: 04/03/2013 Document Reviewed: 01/14/2013 [facility] Patient Information 2015 [place], [place]. This information is not intended to replace advice given to you by your health care provider. Make sure you discuss any questions you have with your health care provider.

## 2014-08-21 NOTE — Progress Notes (Signed)
   Subjective:    Patient ID: Marie [name], female    DOB: 07-Jun-1951, 64 y.o.   MRN: [id number]  HPI 64 y.o. female presents with repeated areas on scalp and back of neck. She has been treated for scabes/psoiasis/lice and it continues to reoccur. Currently sore, nonpuritic papules with one larger sore area.    Review of Systems  Constitutional: Negative.   HENT: Negative.   Respiratory: Negative.   Cardiovascular: Negative.   Gastrointestinal: Negative.   Genitourinary: Negative.   Musculoskeletal: Negative.   Skin: Positive for rash. Negative for color change, pallor and wound.  Neurological: Negative.        Objective:   Physical Exam  Constitutional: She is oriented to person, place, and time.  HENT:  Head: Normocephalic and atraumatic.  Eyes: Conjunctivae and EOM are normal. Pupils are equal, round, and reactive to light.  Neck: Normal range of motion. Neck supple.  Pulmonary/Chest: Effort normal.  Abdominal: Soft. Bowel sounds are normal.  Musculoskeletal: Normal range of motion.  Neurological: She is alert and oriented to person, place, and time.  Skin: Skin is warm and dry. Rash noted.  Scattered erythematous papules on nape of neck with one 2x3 cm erythematous scaly nodule on left occipital.       Assessment & Plan:  Dermatitis- ? Lichen planus from scratching, versus ezema, rule out basal cell carcinoma- will refill steroid solution and refer to Derm.

## 2014-08-25 ENCOUNTER — Other Ambulatory Visit: Payer: Self-pay | Admitting: Internal Medicine

## 2014-08-25 ENCOUNTER — Encounter: Payer: Self-pay | Admitting: Physician Assistant

## 2014-09-01 ENCOUNTER — Telehealth: Payer: Self-pay | Admitting: *Deleted

## 2014-09-01 NOTE — Telephone Encounter (Signed)
Patient requested a new Freestyle Lite Glucometer and strips .  Patient was informed we sent in an RX for strips on 08/21/2014 and that the meter we have is the same one she was given recently.  Patient will continue to use the meter and pick up her RX.

## 2014-09-08 ENCOUNTER — Ambulatory Visit: Payer: Self-pay | Admitting: Internal Medicine

## 2014-09-09 ENCOUNTER — Encounter: Payer: Self-pay | Admitting: Internal Medicine

## 2014-09-09 ENCOUNTER — Ambulatory Visit (INDEPENDENT_AMBULATORY_CARE_PROVIDER_SITE_OTHER): Payer: Medicare Other | Admitting: Internal Medicine

## 2014-09-09 ENCOUNTER — Encounter: Payer: Self-pay | Admitting: *Deleted

## 2014-09-09 VITALS — BP 134/72 | HR 86 | Temp 97.8°F | Resp 16 | Ht 65.0 in | Wt 135.0 lb

## 2014-09-09 DIAGNOSIS — L237 Allergic contact dermatitis due to plants, except food: Secondary | ICD-10-CM | POA: Diagnosis not present

## 2014-09-09 MED ORDER — HYDROCORTISONE 1 % EX CREA: TOPICAL_CREAM | CUTANEOUS | Status: DC

## 2014-09-09 MED ORDER — PREDNISONE 10 MG PO TABS: ORAL_TABLET | ORAL | Status: DC

## 2014-09-09 NOTE — Patient Instructions (Signed)

## 2014-09-09 NOTE — Progress Notes (Signed)
   Subjective:    Patient ID: Marie [name], female    DOB: [birth year], 64 y.o.   MRN: [id number]  Poison Ivy Pertinent negatives include no fatigue, fever, shortness of breath or vomiting.   Patient reports that she raked some leaves and had carried some leaves across the yard.  This has been going on for 4 days.  It is red and itchy.  She has been trying calamine lotion and also been scrubbing at her skin.  She has been taking benadryl for the itching.  She has a history of poison oak and poison ivy in the past.  She picked up sticks.  She did not identify any plants that looked like poison ivy or poison oak that she knows of.  She does report that she knows what it looks like.   Review of Systems  Constitutional: Negative for fever, chills and fatigue.  Eyes: Negative for discharge, itching and visual disturbance.  Respiratory: Negative for chest tightness and shortness of breath.   Gastrointestinal: Negative for nausea and vomiting.  Skin: Positive for color change and rash.       Objective:   Physical Exam  Constitutional: She is oriented to person, place, and time. She appears well-developed and well-nourished. No distress.  HENT:  Head: Normocephalic and atraumatic.  Eyes: Conjunctivae are normal.  Neck: Normal range of motion. Neck supple.  Cardiovascular: Normal rate, regular rhythm, normal heart sounds and intact distal pulses.  Exam reveals no gallop and no friction rub.   No murmur heard. Pulmonary/Chest: Effort normal and breath sounds normal. No respiratory distress. She has no wheezes. She has no rales. She exhibits no tenderness.  Musculoskeletal: Normal range of motion.  Neurological: She is alert and oriented to person, place, and time.  Skin: Skin is warm and dry. She is not diaphoretic.  Erythematous  Macular rash with some drying vessicles which is located over the right cheek, bilateral arms, and bilateral knees.  There is some excoriation with no evidence of  drainage, warmth, petechia, or purpura.    Psychiatric: She has a normal mood and affect. Her behavior is normal. Judgment and thought content normal.  Nursing note and vitals reviewed.  Filed Vitals:   09/09/14 1452  BP: 134/72  Pulse: 86  Temp: 97.8 F (36.6 C)  Resp: 16          Assessment & Plan:    1. Poison oak dermatitis  Rash and history consistent with contact dermatitis secondary to plant exposure.  Will treat with prednisone.  Will give hydrocortisone for itch relief.  Pt. Can take benadryl or zyrtec or clariten as needed for itching.  No evidence of infection at this time.    - predniSONE (DELTASONE) 10 MG tablet; Day 1 take 6 pills,Day 2 take 5 pills,Day 3 take 4 pills,Day 4 take 3 pills,Day 5 take 2 pills,Day 6 take 1 pill  Dispense: 21 tablet; Refill: 0 - hydrocortisone cream 1 %; Apply to affected area 2 times daily  Dispense: 30 g; Refill: 1

## 2014-09-29 DIAGNOSIS — E119 Type 2 diabetes mellitus without complications: Secondary | ICD-10-CM | POA: Diagnosis not present

## 2014-09-29 DIAGNOSIS — H35033 Hypertensive retinopathy, bilateral: Secondary | ICD-10-CM | POA: Diagnosis not present

## 2014-10-09 ENCOUNTER — Other Ambulatory Visit: Payer: Self-pay | Admitting: *Deleted

## 2014-10-09 DIAGNOSIS — E1121 Type 2 diabetes mellitus with diabetic nephropathy: Secondary | ICD-10-CM

## 2014-10-09 MED ORDER — GLUCOSE BLOOD VI STRP: ORAL_STRIP | Status: DC

## 2014-10-22 ENCOUNTER — Other Ambulatory Visit: Payer: Self-pay

## 2014-10-22 MED ORDER — ALPRAZOLAM 1 MG PO TABS: 1.0000 mg | ORAL_TABLET | Freq: Three times a day (TID) | ORAL | Status: DC

## 2014-10-22 MED ORDER — QUETIAPINE FUMARATE 300 MG PO TABS: 300.0000 mg | ORAL_TABLET | Freq: Every day | ORAL | Status: DC

## 2014-10-22 MED ORDER — DULOXETINE HCL 60 MG PO CPEP: 60.0000 mg | ORAL_CAPSULE | Freq: Every day | ORAL | Status: DC

## 2014-10-22 MED ORDER — MEMANTINE HCL ER 28 MG PO CP24: 28.0000 mg | ORAL_CAPSULE | Freq: Every day | ORAL | Status: DC

## 2014-10-27 ENCOUNTER — Other Ambulatory Visit: Payer: Self-pay | Admitting: *Deleted

## 2014-10-27 MED ORDER — DICLOFENAC SODIUM 75 MG PO TBEC: 75.0000 mg | DELAYED_RELEASE_TABLET | Freq: Every day | ORAL | Status: DC

## 2014-10-27 MED ORDER — ATORVASTATIN CALCIUM 80 MG PO TABS: ORAL_TABLET | ORAL | Status: DC

## 2014-10-28 ENCOUNTER — Other Ambulatory Visit: Payer: Self-pay

## 2014-10-28 DIAGNOSIS — E119 Type 2 diabetes mellitus without complications: Secondary | ICD-10-CM

## 2014-10-28 MED ORDER — ONETOUCH VERIO W/DEVICE KIT: 1.0000 | PACK | Freq: Every day | Status: DC

## 2014-10-30 DIAGNOSIS — L821 Other seborrheic keratosis: Secondary | ICD-10-CM | POA: Diagnosis not present

## 2014-10-30 DIAGNOSIS — L281 Prurigo nodularis: Secondary | ICD-10-CM | POA: Diagnosis not present

## 2014-11-05 ENCOUNTER — Ambulatory Visit: Payer: Medicare Other | Admitting: Internal Medicine

## 2014-11-05 NOTE — Progress Notes (Signed)
Patient ID: Marie [name], female   DOB: 05/27/1951, 64 y.o.   MRN: [id number]  N  o       S  h  o  w

## 2015-01-01 ENCOUNTER — Other Ambulatory Visit: Payer: Self-pay | Admitting: Internal Medicine

## 2015-01-03 ENCOUNTER — Other Ambulatory Visit: Payer: Self-pay | Admitting: Internal Medicine

## 2015-01-15 ENCOUNTER — Ambulatory Visit: Payer: Medicare Other | Admitting: Internal Medicine

## 2015-01-15 NOTE — Progress Notes (Signed)
Patient ID: Marie [name], female   DOB: 1951/01/17, 64 y.o.   MRN: [id number] N  o    S  h  o w

## 2015-03-03 ENCOUNTER — Other Ambulatory Visit: Payer: Self-pay | Admitting: *Deleted

## 2015-04-03 ENCOUNTER — Ambulatory Visit (INDEPENDENT_AMBULATORY_CARE_PROVIDER_SITE_OTHER): Payer: Medicare Other | Admitting: Internal Medicine

## 2015-04-03 ENCOUNTER — Encounter: Payer: Self-pay | Admitting: Internal Medicine

## 2015-04-03 VITALS — BP 142/74 | HR 82 | Temp 98.2°F | Resp 18 | Ht 65.0 in | Wt 145.0 lb

## 2015-04-03 DIAGNOSIS — J069 Acute upper respiratory infection, unspecified: Secondary | ICD-10-CM | POA: Diagnosis not present

## 2015-04-03 MED ORDER — PREDNISONE 20 MG PO TABS: ORAL_TABLET | ORAL | Status: DC

## 2015-04-03 NOTE — Progress Notes (Signed)
Patient ID: Marie [name], female   DOB: [birth year], 64 y.o.   MRN: [id number]  HPI  Patient presents to the office for evaluation of cough.  It has been going on for 5 days.  Patient reports day > night, wet, white sputum production.  They also endorse postnasal drip and chest pain after couging, headaches, sinus pressure, right sided tinnitus. .  They have tried none.  They report that nothing has worked.  They admits to other sick contacts.  She has not taken anything over the counter to help.  She reports that she does not usually have allergies.  Her granddaughter has been sick.    Review of Systems  Constitutional: Negative for fever, chills and malaise/fatigue.  HENT: Positive for congestion. Negative for ear pain and sore throat.   Eyes: Negative.   Respiratory: Positive for cough. Negative for shortness of breath and wheezing.   Cardiovascular: Positive for chest pain. Negative for palpitations and leg swelling.  Skin: Negative.   Neurological: Negative for headaches.    PE:  Filed Vitals:   04/03/15 0930  BP: 142/74  Pulse: 82  Temp: 98.2 F (36.8 C)  Resp: 18     General:  Alert and non-toxic, WDWN, NAD HEENT: NCAT, PERLA, EOM normal, no occular discharge or erythema.  Nasal mucosal edema with sinus tenderness to palpation.  Oropharynx clear with minimal oropharyngeal edema and erythema.  Mucous membranes moist and pink. Neck:  Cervical adenopathy Chest:  RRR no MRGs.  Lungs clear to auscultation A&P with no wheezes rhonchi or rales.   Abdomen: +BS x 4 quadrants, soft, non-tender, no guarding, rigidity, or rebound. Skin: warm and dry no rash Neuro: A&Ox4, CN II-XII grossly intact  Assessment and Plan:   1. Acute URI -prednisone -cont zyrtec -drink plenty of water -nasal saline

## 2015-04-03 NOTE — Patient Instructions (Signed)
Please take the prednisone until it is all the way gone.  Please make sure that you stop taking afrin daily.  Please make sure that your are using saline in your nose.    Please keep taking zyrtec or cetirizine daily.  Please call your insurance company to see if there is a psychiatrist you can see in the area or call Dr. Layla Barter office.

## 2015-04-13 ENCOUNTER — Other Ambulatory Visit: Payer: Self-pay | Admitting: *Deleted

## 2015-04-13 MED ORDER — QUETIAPINE FUMARATE 300 MG PO TABS
300.0000 mg | ORAL_TABLET | Freq: Every day | ORAL | Status: DC
Start: 2015-04-13 — End: 2015-08-04

## 2015-05-05 ENCOUNTER — Encounter: Payer: Self-pay | Admitting: Internal Medicine

## 2015-05-05 ENCOUNTER — Ambulatory Visit (INDEPENDENT_AMBULATORY_CARE_PROVIDER_SITE_OTHER): Payer: Medicare Other | Admitting: Internal Medicine

## 2015-05-05 VITALS — BP 136/68 | HR 100 | Temp 98.2°F | Resp 16 | Ht 64.0 in | Wt 149.0 lb

## 2015-05-05 DIAGNOSIS — I10 Essential (primary) hypertension: Secondary | ICD-10-CM | POA: Diagnosis not present

## 2015-05-05 DIAGNOSIS — E1121 Type 2 diabetes mellitus with diabetic nephropathy: Secondary | ICD-10-CM

## 2015-05-05 DIAGNOSIS — F32A Depression, unspecified: Secondary | ICD-10-CM

## 2015-05-05 DIAGNOSIS — E559 Vitamin D deficiency, unspecified: Secondary | ICD-10-CM | POA: Diagnosis not present

## 2015-05-05 DIAGNOSIS — E039 Hypothyroidism, unspecified: Secondary | ICD-10-CM

## 2015-05-05 DIAGNOSIS — Z1331 Encounter for screening for depression: Secondary | ICD-10-CM

## 2015-05-05 DIAGNOSIS — Z9181 History of falling: Secondary | ICD-10-CM

## 2015-05-05 DIAGNOSIS — Z789 Other specified health status: Secondary | ICD-10-CM | POA: Diagnosis not present

## 2015-05-05 DIAGNOSIS — Z1389 Encounter for screening for other disorder: Secondary | ICD-10-CM | POA: Diagnosis not present

## 2015-05-05 DIAGNOSIS — E782 Mixed hyperlipidemia: Secondary | ICD-10-CM

## 2015-05-05 DIAGNOSIS — Z6822 Body mass index (BMI) 22.0-22.9, adult: Secondary | ICD-10-CM | POA: Insufficient documentation

## 2015-05-05 DIAGNOSIS — Z0000 Encounter for general adult medical examination without abnormal findings: Secondary | ICD-10-CM

## 2015-05-05 DIAGNOSIS — Z79899 Other long term (current) drug therapy: Secondary | ICD-10-CM

## 2015-05-05 DIAGNOSIS — F0280 Dementia in other diseases classified elsewhere without behavioral disturbance: Secondary | ICD-10-CM

## 2015-05-05 DIAGNOSIS — E1129 Type 2 diabetes mellitus with other diabetic kidney complication: Secondary | ICD-10-CM | POA: Diagnosis not present

## 2015-05-05 DIAGNOSIS — G301 Alzheimer's disease with late onset: Secondary | ICD-10-CM

## 2015-05-05 DIAGNOSIS — Z0001 Encounter for general adult medical examination with abnormal findings: Secondary | ICD-10-CM

## 2015-05-05 DIAGNOSIS — F329 Major depressive disorder, single episode, unspecified: Secondary | ICD-10-CM

## 2015-05-05 DIAGNOSIS — Z1212 Encounter for screening for malignant neoplasm of rectum: Secondary | ICD-10-CM

## 2015-05-05 DIAGNOSIS — Z6824 Body mass index (BMI) 24.0-24.9, adult: Secondary | ICD-10-CM | POA: Insufficient documentation

## 2015-05-05 LAB — CBC WITH DIFFERENTIAL/PLATELET
BASOS ABS: 0 10*3/uL (ref 0.0–0.1)
BASOS PCT: 1 % (ref 0–1)
Eosinophils Absolute: 0.1 10*3/uL (ref 0.0–0.7)
Eosinophils Relative: 2 % (ref 0–5)
HCT: 37.7 % (ref 36.0–46.0)
HEMOGLOBIN: 12.9 g/dL (ref 12.0–15.0)
Lymphocytes Relative: 25 % (ref 12–46)
Lymphs Abs: 1.2 10*3/uL (ref 0.7–4.0)
MCH: 30.1 pg (ref 26.0–34.0)
MCHC: 34.2 g/dL (ref 30.0–36.0)
MCV: 88.1 fL (ref 78.0–100.0)
MONOS PCT: 13 % — AB (ref 3–12)
MPV: 8.2 fL — AB (ref 8.6–12.4)
Monocytes Absolute: 0.6 10*3/uL (ref 0.1–1.0)
NEUTROS ABS: 2.7 10*3/uL (ref 1.7–7.7)
NEUTROS PCT: 59 % (ref 43–77)
Platelets: 365 10*3/uL (ref 150–400)
RBC: 4.28 MIL/uL (ref 3.87–5.11)
RDW: 13.8 % (ref 11.5–15.5)
WBC: 4.6 10*3/uL (ref 4.0–10.5)

## 2015-05-05 NOTE — Patient Instructions (Signed)
Recommend Adult Low Dose Aspirin or   coated  Aspirin 81 mg daily   To reduce risk of Colon Cancer 20 %,   Skin Cancer 26 % ,   Melanoma 46%   and   Pancreatic cancer 60%   ++++++++++++++++++++++++++++++++++++++++++++++++++++++  Vitamin D goal   is between 70-100.   Please make sure that you are taking your Vitamin D as directed.   It is very important as a natural anti-inflammatory   helping hair, skin, and nails, as well as reducing stroke and heart attack risk.   It helps your bones and helps with mood.  It also decreases numerous cancer risks so please take it as directed.   Low Vit D is associated with a 200-300% higher risk for CANCER   and 200-300% higher risk for HEART   ATTACK  &  STROKE.   ......................................  It is also associated with higher death rate at younger ages,   autoimmune diseases like Rheumatoid arthritis, Lupus, Multiple Sclerosis.     Also many other serious conditions, like depression, Alzheimer's  Dementia, infertility, muscle aches, fatigue, fibromyalgia - just to name a few.  ++++++++++++++++++++++++++++++++++++++++++++++++  Recommend the book "The END of DIETING" by Dr Joel Fuhrman   & the book "The END of DIABETES " by Dr Joel Fuhrman  At [employer] - get book & Audio CD's     Being diabetic has a  300% increased risk for heart attack, stroke, cancer, and alzheimer- type vascular dementia. It is very important that you work harder with diet by avoiding all foods that are white. Avoid white rice (brown & wild rice is OK), white potatoes (sweetpotatoes in moderation is OK), White bread or wheat bread or anything made out of white flour like bagels, donuts, rolls, buns, biscuits, cakes, pastries, cookies, pizza crust, and pasta (made from white flour & egg whites) - vegetarian pasta or spinach or wheat pasta is OK. Multigrain breads like Arnold's or [employer], or multigrain sandwich thins or flatbreads.  Diet,  exercise and weight loss can reverse and cure diabetes in the early stages.  Diet, exercise and weight loss is very important in the control and prevention of complications of diabetes which affects every system in your body, ie. Brain - dementia/stroke, eyes - glaucoma/blindness, heart - heart attack/heart failure, kidneys - dialysis, stomach - gastric paralysis, intestines - malabsorption, nerves - severe painful neuritis, circulation - gangrene & loss of a leg(s), and finally cancer and Alzheimers.    I recommend avoid fried & greasy foods,  sweets/candy, white rice (brown or wild rice or Quinoa is OK), white potatoes (sweet potatoes are OK) - anything made from white flour - bagels, doughnuts, rolls, buns, biscuits,white and wheat breads, pizza crust and traditional pasta made of white flour & egg white(vegetarian pasta or spinach or wheat pasta is OK).  Multi-grain bread is OK - like multi-grain flat bread or sandwich thins. Avoid alcohol in excess. Exercise is also important.    Eat all the vegetables you want - avoid meat, especially red meat and dairy - especially cheese.  Cheese is the most concentrated form of trans-fats which is the worst thing to clog up our arteries. Veggie cheese is OK which can be found in the fresh produce section at [employer] or [employer] or [employer]  ++++++++++++++++++++++++++++++++++++++++++++++++++ DASH Eating Plan  DASH stands for "Dietary Approaches to Stop Hypertension."   The DASH eating plan is a healthy eating plan that has been shown to reduce high   blood pressure (hypertension). Additional health benefits may include reducing the risk of type 2 diabetes mellitus, heart disease, and stroke. The DASH eating plan may also help with weight loss.  WHAT DO I NEED TO KNOW ABOUT THE DASH EATING PLAN? For the DASH eating plan, you will follow these general guidelines:  Choose foods with a percent daily value for sodium of less than 5% (as listed on the food  label).  Use salt-free seasonings or herbs instead of table salt or sea salt.  Check with your health care provider or pharmacist before using salt substitutes.  Eat lower-sodium products, often labeled as "lower sodium" or "no salt added."  Eat fresh foods.  Eat more vegetables, fruits, and low-fat dairy products.    Choose whole grains. Look for the word "whole" as the first word in the ingredient list.  Choose fish   Limit sweets, desserts, sugars, and sugary drinks.  Choose heart-healthy fats.  Eat veggie cheese   Eat more home-cooked food and less restaurant, buffet, and fast food.  Limit fried foods.  Cook foods using methods other than frying.  Limit canned vegetables. If you do use them, rinse them well to decrease the sodium.  When eating at a restaurant, ask that your food be prepared with less salt, or no salt if possible.                      WHAT FOODS CAN I EAT?  Seek help from a dietitian for individual calorie needs. Grains Whole grain or whole wheat bread. Brown rice. Whole grain or whole wheat pasta. Quinoa, bulgur, and whole grain cereals. Low-sodium cereals. Corn or whole wheat flour tortillas. Whole grain cornbread. Whole grain crackers. Low-sodium crackers.  Vegetables Fresh or frozen vegetables (raw, steamed, roasted, or grilled). Low-sodium or reduced-sodium tomato and vegetable juices. Low-sodium or reduced-sodium tomato sauce and paste. Low-sodium or reduced-sodium canned vegetables.   Fruits All fresh, canned (in natural juice), or frozen fruits.  Meat and Other Protein Products  All fish and seafood.  Dried beans, peas, or lentils. Unsalted nuts and seeds. Unsalted canned beans. Dairy Low-fat dairy products, such as skim or 1% milk, 2% or reduced-fat cheeses, low-fat ricotta or cottage cheese, or plain low-fat yogurt. Low-sodium or reduced-sodium cheeses.  Fats and Oils Tub margarines without trans fats. Light or reduced-fat mayonnaise  and salad dressings (reduced sodium). Avocado. Safflower, olive, or canola oils. Natural peanut or almond butter.  Other Unsalted popcorn and pretzels. The items listed above may not be a complete list of recommended foods or beverages. Contact your dietitian for more options.  +++++++++++++++++++++++++++++++++++++++++++  WHAT FOODS ARE NOT RECOMMENDED?  Grains/ White flour or wheat flour  White bread. White pasta. White rice. Refined cornbread. Bagels and croissants. Crackers that contain trans fat.  Vegetables  Creamed or fried vegetables. Vegetables in a . Regular canned vegetables. Regular canned tomato sauce and paste. Regular tomato and vegetable juices.  Fruits Dried fruits. Canned fruit in light or heavy syrup. Fruit juice.  Meat and Other Protein Products Meat in general. Fatty cuts of meat. Ribs, chicken wings, bacon, sausage, bologna, salami, chitterlings, fatback, hot dogs, bratwurst, and packaged luncheon meats. Salted nuts and seeds. Canned beans with salt.  Dairy Whole or 2% milk, cream, half-and-half, and cream cheese. Whole-fat or sweetened yogurt. Full-fat cheeses or blue cheese. Nondairy creamers and whipped toppings. Processed cheese, cheese spreads, or cheese curds.  Condiments Onion and garlic salt, seasoned salt, table salt, and sea  salt. Canned and packaged gravies. Worcestershire sauce. Tartar sauce. Barbecue sauce. Teriyaki sauce. Soy sauce, including reduced sodium. Steak sauce. Fish sauce. Oyster sauce. Cocktail sauce. Horseradish. Ketchup and mustard. Meat flavorings and tenderizers. Bouillon cubes. Hot sauce. Tabasco sauce. Marinades. Taco seasonings. Relishes.  Fats and Oils Butter, stick margarine, lard, shortening, ghee, and bacon fat. Coconut, palm kernel, or palm oils. Regular salad dressings.  Pickles and olives. Salted popcorn and pretzels. The items listed above may not be a complete list of foods and beverages to avoid.  Preventive Care for  Adults  A healthy lifestyle and preventive care can promote health and wellness. Preventive health guidelines for women include the following key practices.  A routine yearly physical is a good way to check with your health care provider about your health and preventive screening. It is a chance to share any concerns and updates on your health and to receive a thorough exam.  Visit your dentist for a routine exam and preventive care every 6 months. Brush your teeth twice a day and floss once a day. Good oral hygiene prevents tooth decay and gum disease.  The frequency of eye exams is based on your age, health, family medical history, use of contact lenses, and other factors. Follow your health care provider's recommendations for frequency of eye exams.  Eat a healthy diet. Foods like vegetables, fruits, whole grains, low-fat dairy products, and lean protein foods contain the nutrients you need without too many calories. Decrease your intake of foods high in solid fats, added sugars, and salt. Eat the right amount of calories for you.Get information about a proper diet from your health care provider, if necessary.  Regular physical exercise is one of the most important things you can do for your health. Most adults should get at least 150 minutes of moderate-intensity exercise (any activity that increases your heart rate and causes you to sweat) each week. In addition, most adults need muscle-strengthening exercises on 2 or more days a week.  Maintain a healthy weight. The body mass index (BMI) is a screening tool to identify possible weight problems. It provides an estimate of body fat based on height and weight. Your health care provider can find your BMI and can help you achieve or maintain a healthy weight.For adults 20 years and older:  A BMI below 18.5 is considered underweight.  A BMI of 18.5 to 24.9 is normal.  A BMI of 25 to 29.9 is considered overweight.  A BMI of 30 and above is  considered obese.  Maintain normal blood lipids and cholesterol levels by exercising and minimizing your intake of saturated fat. Eat a balanced diet with plenty of fruit and vegetables. Blood tests for lipids and cholesterol should begin at age 75 and be repeated every 5 years. If your lipid or cholesterol levels are high, you are over 50, or you are at high risk for heart disease, you may need your cholesterol levels checked more frequently.Ongoing high lipid and cholesterol levels should be treated with medicines if diet and exercise are not working.  If you smoke, find out from your health care provider how to quit. If you do not use tobacco, do not start.  Lung cancer screening is recommended for adults aged 63-80 years who are at high risk for developing lung cancer because of a history of smoking. A yearly low-dose CT scan of the lungs is recommended for people who have at least a 30-pack-year history of smoking and are a current smoker  or have quit within the past 15 years. A pack year of smoking is smoking an average of 1 pack of cigarettes a day for 1 year (for example: 1 pack a day for 30 years or 2 packs a day for 15 years). Yearly screening should continue until the smoker has stopped smoking for at least 15 years. Yearly screening should be stopped for people who develop a health problem that would prevent them from having lung cancer treatment.  High blood pressure causes heart disease and increases the risk of stroke. Your blood pressure should be checked at least every 1 to 2 years. Ongoing high blood pressure should be treated with medicines if weight loss and exercise do not work.  If you are 51-33 years old, ask your health care provider if you should take aspirin to prevent strokes.  Diabetes screening involves taking a blood sample to check your fasting blood sugar level. This should be done once every 3 years, after age 93, if you are within normal weight and without risk factors  for diabetes. Testing should be considered at a younger age or be carried out more frequently if you are overweight and have at least 1 risk factor for diabetes.  Breast cancer screening is essential preventive care for women. You should practice "breast self-awareness." This means understanding the normal appearance and feel of your breasts and may include breast self-examination. Any changes detected, no matter how small, should be reported to a health care provider. Women in their 16s and 30s should have a clinical breast exam (CBE) by a health care provider as part of a regular health exam every 1 to 3 years. After age 67, women should have a CBE every year. Starting at age 24, women should consider having a mammogram (breast X-ray test) every year. Women who have a family history of breast cancer should talk to their health care provider about genetic screening. Women at a high risk of breast cancer should talk to their health care providers about having an MRI and a mammogram every year.  Breast cancer gene (BRCA)-related cancer risk assessment is recommended for women who have family members with BRCA-related cancers. BRCA-related cancers include breast, ovarian, tubal, and peritoneal cancers. Having family members with these cancers may be associated with an increased risk for harmful changes (mutations) in the breast cancer genes BRCA1 and BRCA2. Results of the assessment will determine the need for genetic counseling and BRCA1 and BRCA2 testing.  Routine pelvic exams to screen for cancer are no longer recommended for nonpregnant women who are considered low risk for cancer of the pelvic organs (ovaries, uterus, and vagina) and who do not have symptoms. Ask your health care provider if a screening pelvic exam is right for you.  If you have had past treatment for cervical cancer or a condition that could lead to cancer, you need Pap tests and screening for cancer for at least 20 years after your  treatment. If Pap tests have been discontinued, your risk factors (such as having a new sexual partner) need to be reassessed to determine if screening should be resumed. Some women have medical problems that increase the chance of getting cervical cancer. In these cases, your health care provider may recommend more frequent screening and Pap tests.  Colorectal cancer can be detected and often prevented. Most routine colorectal cancer screening begins at the age of 30 years and continues through age 35 years. However, your health care provider may recommend screening at an earlier  age if you have risk factors for colon cancer. On a yearly basis, your health care provider may provide home test kits to check for hidden blood in the stool. Use of a small camera at the end of a tube, to directly examine the colon (sigmoidoscopy or colonoscopy), can detect the earliest forms of colorectal cancer. Talk to your health care provider about this at age 63, when routine screening begins. Direct exam of the colon should be repeated every 5-10 years through age 86 years, unless early forms of pre-cancerous polyps or small growths are found.  Hepatitis C blood testing is recommended for all people born from [birth year] through 1965 and any individual with known risks for hepatitis C.  Pra  Osteoporosis is a disease in which the bones lose minerals and strength with aging. This can result in serious bone fractures or breaks. The risk of osteoporosis can be identified using a bone density scan. Women ages 53 years and over and women at risk for fractures or osteoporosis should discuss screening with their health care providers. Ask your health care provider whether you should take a calcium supplement or vitamin D to reduce the rate of osteoporosis.  Menopause can be associated with physical symptoms and risks. Hormone replacement therapy is available to decrease symptoms and risks. You should talk to your health care provider  about whether hormone replacement therapy is right for you.  Use sunscreen. Apply sunscreen liberally and repeatedly throughout the day. You should seek shade when your shadow is shorter than you. Protect yourself by wearing long sleeves, pants, a wide-brimmed hat, and sunglasses year round, whenever you are outdoors.  Once a month, do a whole body skin exam, using a mirror to look at the skin on your back. Tell your health care provider of new moles, moles that have irregular borders, moles that are larger than a pencil eraser, or moles that have changed in shape or color.  Stay current with required vaccines (immunizations).  Influenza vaccine. All adults should be immunized every year.  Tetanus, diphtheria, and acellular pertussis (Td, Tdap) vaccine. Pregnant women should receive 1 dose of Tdap vaccine during each pregnancy. The dose should be obtained regardless of the length of time since the last dose. Immunization is preferred during the 27th-36th week of gestation. An adult who has not previously received Tdap or who does not know her vaccine status should receive 1 dose of Tdap. This initial dose should be followed by tetanus and diphtheria toxoids (Td) booster doses every 10 years. Adults with an unknown or incomplete history of completing a 3-dose immunization series with Td-containing vaccines should begin or complete a primary immunization series including a Tdap dose. Adults should receive a Td booster every 10 years.  Varicella vaccine. An adult without evidence of immunity to varicella should receive 2 doses or a second dose if she has previously received 1 dose. Pregnant females who do not have evidence of immunity should receive the first dose after pregnancy. This first dose should be obtained before leaving the health care facility. The second dose should be obtained 4-8 weeks after the first dose.  Human papillomavirus (HPV) vaccine. Females aged 13-26 years who have not received  the vaccine previously should obtain the 3-dose series. The vaccine is not recommended for use in pregnant females. However, pregnancy testing is not needed before receiving a dose. If a female is found to be pregnant after receiving a dose, no treatment is needed. In that case, the remaining doses  should be delayed until after the pregnancy. Immunization is recommended for any person with an immunocompromised condition through the age of 11 years if she did not get any or all doses earlier. During the 3-dose series, the second dose should be obtained 4-8 weeks after the first dose. The third dose should be obtained 24 weeks after the first dose and 16 weeks after the second dose.  Zoster vaccine. One dose is recommended for adults aged 34 years or older unless certain conditions are present.  Measles, mumps, and rubella (MMR) vaccine. Adults born before [birth year] generally are considered immune to measles and mumps. Adults born in [birth year] or later should have 1 or more doses of MMR vaccine unless there is a contraindication to the vaccine or there is laboratory evidence of immunity to each of the three diseases. A routine second dose of MMR vaccine should be obtained at least 28 days after the first dose for students attending postsecondary schools, health care workers, or international travelers. People who received inactivated measles vaccine or an unknown type of measles vaccine during 1963-1967 should receive 2 doses of MMR vaccine. People who received inactivated mumps vaccine or an unknown type of mumps vaccine before 1979 and are at high risk for mumps infection should consider immunization with 2 doses of MMR vaccine. For females of childbearing age, rubella immunity should be determined. If there is no evidence of immunity, females who are not pregnant should be vaccinated. If there is no evidence of immunity, females who are pregnant should delay immunization until after pregnancy. Unvaccinated health care  workers born before [birth year] who lack laboratory evidence of measles, mumps, or rubella immunity or laboratory confirmation of disease should consider measles and mumps immunization with 2 doses of MMR vaccine or rubella immunization with 1 dose of MMR vaccine.  Pneumococcal 13-valent conjugate (PCV13) vaccine. When indicated, a person who is uncertain of her immunization history and has no record of immunization should receive the PCV13 vaccine. An adult aged 1 years or older who has certain medical conditions and has not been previously immunized should receive 1 dose of PCV13 vaccine. This PCV13 should be followed with a dose of pneumococcal polysaccharide (PPSV23) vaccine. The PPSV23 vaccine dose should be obtained at least 8 weeks after the dose of PCV13 vaccine. An adult aged 71 years or older who has certain medical conditions and previously received 1 or more doses of PPSV23 vaccine should receive 1 dose of PCV13. The PCV13 vaccine dose should be obtained 1 or more years after the last PPSV23 vaccine dose.    Pneumococcal polysaccharide (PPSV23) vaccine. When PCV13 is also indicated, PCV13 should be obtained first. All adults aged 15 years and older should be immunized. An adult younger than age 69 years who has certain medical conditions should be immunized. Any person who resides in a nursing home or long-term care facility should be immunized. An adult smoker should be immunized. People with an immunocompromised condition and certain other conditions should receive both PCV13 and PPSV23 vaccines. People with human immunodeficiency virus (HIV) infection should be immunized as soon as possible after diagnosis. Immunization during chemotherapy or radiation therapy should be avoided. Routine use of PPSV23 vaccine is not recommended for American Indians, [place]atives, or people younger than 65 years unless there are medical conditions that require PPSV23 vaccine. When indicated, people who have unknown  immunization and have no record of immunization should receive PPSV23 vaccine. One-time revaccination 5 years after the first dose of PPSV23 is  recommended for people aged 19-64 years who have chronic kidney failure, nephrotic syndrome, asplenia, or immunocompromised conditions. People who received 1-2 doses of PPSV23 before age 82 years should receive another dose of PPSV23 vaccine at age 84 years or later if at least 5 years have passed since the previous dose. Doses of PPSV23 are not needed for people immunized with PPSV23 at or after age 30 years.  Preventive Services / Frequency   Ages 61 to 49 years  Blood pressure check.  Lipid and cholesterol check.  Lung cancer screening. / Every year if you are aged 45-80 years and have a 30-pack-year history of smoking and currently smoke or have quit within the past 15 years. Yearly screening is stopped once you have quit smoking for at least 15 years or develop a health problem that would prevent you from having lung cancer treatment.  Clinical breast exam.** / Every year after age 98 years.  BRCA-related cancer risk assessment.** / For women who have family members with a BRCA-related cancer (breast, ovarian, tubal, or peritoneal cancers).  Mammogram.** / Every year beginning at age 34 years and continuing for as long as you are in good health. Consult with your health care provider.  Pap test.** / Every 3 years starting at age 8 years through age 87 or 74 years with a history of 3 consecutive normal Pap tests.  HPV screening.** / Every 3 years from ages 72 years through ages 43 to 14 years with a history of 3 consecutive normal Pap tests.  Fecal occult blood test (FOBT) of stool. / Every year beginning at age 30 years and continuing until age 16 years. You may not need to do this test if you get a colonoscopy every 10 years.  Flexible sigmoidoscopy or colonoscopy.** / Every 5 years for a flexible sigmoidoscopy or every 10 years for a  colonoscopy beginning at age 16 years and continuing until age 69 years.  Hepatitis C blood test.** / For all people born from [birth year] through 1964 and any individual with known risks for hepatitis C.  Skin self-exam. / Monthly.  Influenza vaccine. / Every year.  Tetanus, diphtheria, and acellular pertussis (Tdap/Td) vaccine.** / Consult your health care provider. Pregnant women should receive 1 dose of Tdap vaccine during each pregnancy. 1 dose of Td every 10 years.  Varicella vaccine.** / Consult your health care provider. Pregnant females who do not have evidence of immunity should receive the first dose after pregnancy.  Zoster vaccine.** / 1 dose for adults aged 30 years or older.  Pneumococcal 13-valent conjugate (PCV13) vaccine.** / Consult your health care provider.  Pneumococcal polysaccharide (PPSV23) vaccine.** / 1 to 2 doses if you smoke cigarettes or if you have certain conditions.  Meningococcal vaccine.** / Consult your health care provider.  Hepatitis A vaccine.** / Consult your health care provider.  Hepatitis B vaccine.** / Consult your health care provider. Screening for abdominal aortic aneurysm (AAA)  by ultrasound is recommended for people over 50 who have history of high blood pressure or who are current or former smokers.

## 2015-05-05 NOTE — Progress Notes (Signed)
Patient ID: Marie [name], female   DOB: [birth year], 64 y.o.   MRN: [id number] Annual Screening/Preventative Visit And Comprehensive Evaluation, Examination and Management  This very nice 64 y.o. MWF presents for presents for a Wellness/Preventative Visit & comprehensive evaluation and management of multiple medical co-morbidities.  Patient has been followed for HTN, T2_NIDDM  , Hyperlipidemia, Depression and Vitamin D Deficiency.    HTN predates since 2000. Patient's BP has been controlled at home and patient denies any cardiac symptoms as chest pain, palpitations, shortness of breath, dizziness or ankle swelling. Today's BP: 136/68 mmHg    Patient's hyperlipidemia is controlled with diet and medications. Patient denies myalgias or other medication SE's. Last lipids were at goal with Cholesterol 151; HDL 45; LDL 83; Triglycerides 114 on 08/04/2014.   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 Last A1c was 6.1% on 08/04/2014.   Patient is on SS Disability for depression and cognitive deficit and has been followed by Dr Marie [name].  Patient reports that she is doing well on current meds w/o active depression. Finally, patient has history of Vitamin D Deficiency of 29 in 2008 and last Vitamin D was still low at 40 on 08/04/2014.  Medication Sig  . atorvastatin  80 MG tablet Patient not taking: Reported on 04/04/2015  . cetirizine  10 MG tablet TAKE 1 TABLET BY MOUTH EVERY NIGHT AT BEDTIME  . VITAMIN D  Take 2,000-3,000 Int'l Units by mouth daily.   . clobetasol (TEMOVATE) 0.05 % external solution Apply 1 application topically 2 (two) times daily.  . diclofenac  75 MG EC tablet Take 1 tablet (75 mg total) by mouth daily. For pain or inflammation  . DULoxetine  60 MG capsule Take 1 capsule (60 mg total) by mouth daily.  . hydrocortisone cream 1 % Apply to affected area 2 times daily   . Magnesium 250 MG TABS Take 250 mg by mouth daily at 6 PM.  . memantine (NAMENDA XR) 28 MG CP24 24 hr capsule Take 1 capsule (28 mg total) by mouth daily at 6 PM.  . metFORMIN -XR 500 MG 24 hr tablet TAKE 2 TABLETS BY MOUTH 2 TIMES A DAY AFTER MEALS  . QUEtiapine (SEROQUEL) 300 MG tablet Take 1 tablet (300 mg total) by mouth at bedtime.  Marland Kitchen rOPINIRole (REQUIP) 1 MG tablet TAKE ONE TABLET BY MOUTH ONCE DAILY AT BEDTIME   Allergies  Allergen Reactions  . Penicillins Rash   Past Medical History  Diagnosis Date  . Diabetes mellitus   . Type II or unspecified type diabetes mellitus without mention of complication, not stated as uncontrolled   . Depression   . Hypertension   . Dementia   . Vitamin D deficiency   . Thyroid disease    Health Maintenance  Topic Date Due  . TETANUS/TDAP  11/14/1970  . MAMMOGRAM  11/13/2001  . OPHTHALMOLOGY EXAM  11/22/2014  . INFLUENZA VACCINE  01/26/2015  . HEMOGLOBIN A1C  02/02/2015  . PAP SMEAR  04/18/2015  . URINE MICROALBUMIN  04/29/2015  . FOOT EXAM  08/05/2015  . COLONOSCOPY  04/26/2019  . PNEUMOCOCCAL POLYSACCHARIDE VACCINE  Completed  . ZOSTAVAX  Completed  . Hepatitis C Screening  Completed  . HIV Screening  Completed   Immunization History  Administered Date(s) Administered  . DT 11/25/2005  . Pneumococcal Conjugate-13 08/04/2014  . Pneumococcal Polysaccharide-23 03/28/1999  . Zoster 04/28/2014  Past Surgical History  Procedure Laterality Date  . 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 Fracture surgery  2009    right arm; rod   Family History  Problem Relation Age of Onset  . Colon cancer Neg Hx   . Stomach cancer Neg Hx    Social History  Substance Use Topics  . Smoking status: Never Smoker   . Smokeless tobacco: Never Used  . Alcohol Use: No    ROS Constitutional: Denies fever, chills, weight loss/gain, headaches, insomnia,  night sweats, and change in appetite. Does c/o fatigue. Eyes: Denies redness, blurred  vision, diplopia, discharge, itchy, watery eyes.  ENT: Denies discharge, congestion, post nasal drip, epistaxis, sore throat, earache, hearing loss, dental pain, Tinnitus, Vertigo, Sinus pain, snoring.  Cardio: Denies chest pain, palpitations, irregular heartbeat, syncope, dyspnea, diaphoresis, orthopnea, PND, claudication, edema Respiratory: denies cough, dyspnea, DOE, pleurisy, hoarseness, laryngitis, wheezing.  Gastrointestinal: Denies dysphagia, heartburn, reflux, water brash, pain, cramps, nausea, vomiting, bloating, diarrhea, constipation, hematemesis, melena, hematochezia, jaundice, hemorrhoids Genitourinary: Denies dysuria, frequency, urgency, nocturia, hesitancy, discharge, hematuria, flank pain Breast: Breast lumps, nipple discharge, bleeding.  Musculoskeletal: Denies arthralgia, myalgia, stiffness, Jt. Swelling, pain, limp, and strain/sprain. Denies falls. Skin: Denies puritis, rash, hives, warts, acne, eczema, changing in skin lesion Neuro: No weakness, tremor, incoordination, spasms, paresthesia, pain Psychiatric: Denies confusion, memory loss, sensory loss. Denies Depression. Endocrine: Denies change in weight, skin, hair change, nocturia, and paresthesia, diabetic polys, visual blurring, hyper / hypo glycemic episodes.  Heme/Lymph: No excessive bleeding, bruising, enlarged lymph nodes.  Physical Exam  BP 136/68 mmHg  Pulse 100  Temp(Src) 98.2 F (36.8 C) (Temporal)  Resp 16  Ht 5\' 4"  (1.626 m)  Wt 149 lb (67.586 kg)  BMI 25.56 kg/m2  General Appearance: Well nourished and in no apparent distress. Eyes: PERRLA, EOMs, conjunctiva no swelling or erythema, normal fundi and vessels. Sinuses: No frontal/maxillary tenderness ENT/Mouth: EACs patent / TMs  nl. Nares clear without erythema, swelling, mucoid exudates. Oral hygiene is good. No erythema, swelling, or exudate. Tongue normal, non-obstructing. Tonsils not swollen or erythematous. Hearing normal.  Neck: Supple, thyroid  normal. No bruits, nodes or JVD. Respiratory: Respiratory effort normal.  BS equal and clear bilateral without rales, rhonci, wheezing or stridor. Cardio: Heart sounds are normal with regular rate and rhythm and no murmurs, rubs or gallops. Peripheral pulses are normal and equal bilaterally without edema. No aortic or femoral bruits. Chest: symmetric with normal excursions and percussion. Breasts: Symmetric, without lumps, nipple discharge, retractions, or fibrocystic changes.  Abdomen: Flat, soft, with bowl sounds. Nontender, no guarding, rebound, hernias, masses, or organomegaly.  Lymphatics: Non tender without lymphadenopathy.  Musculoskeletal: Full ROM all peripheral extremities, joint stability, 5/5 strength, and normal gait. Skin: Warm and dry without rashes, lesions, cyanosis, clubbing or  ecchymosis.  Neuro: Cranial nerves intact, reflexes equal bilaterally in UE's and KJ's . Normal muscle tone, no cerebellar symptoms. Sensation intact by pressure, vibratory and Monofilament testing to the  toes bilaterally.  Pysch: Awake and oriented X 3, normal affect, Insight and Judgment appropriate.   Assessment and Plan  1. Annual Preventative Screening Examination  - Microalbumin / creatinine urine ratio - EKG 12-Lead - [place], RETROPERITNL ABD,  LTD - POC Hemoccult Bld/Stl  - HM DIABETES FOOT EXAM - LOW EXTREMITY NEUR EXAM DOCUM - Urinalysis, Routine w reflex microscopic - CBC with Differential/Platelet - BASIC METABOLIC PANEL WITH GFR - Hepatic function panel - Magnesium - Lipid panel - TSH -  Hemoglobin A1c - Insulin, random - Vit D  25 hydroxy   2. Essential hypertension  - EKG 12-Lead - [place], RETROPERITNL ABD,  LTD - TSH  3. Hyperlipidemia  - Lipid panel  4. Type 2 diabetes mellitus with diabetic nephropathy, without long-term current use of insulin (HCC)  - Microalbumin / creatinine urine ratio - HM DIABETES FOOT EXAM - LOW EXTREMITY NEUR EXAM DOCUM - Hemoglobin A1c -  Insulin, random  5. Vitamin D deficiency  - Vit D  25 hydroxy   6. Hypothyroidism  - TSH  7. Depression, controlled   8. SDAT    9. Screening for rectal cancer  - POC Hemoccult Bld/Stl   10. Depression screen   11. At low risk for fall   12. Body mass index (BMI) of 24.0-24.9 in adult   13. Medication management  - Urinalysis, Routine w reflex microscopic  - CBC with Differential/Platelet - BASIC METABOLIC PANEL WITH GFR - Hepatic function panel - Magnesium   Continue prudent diet as discussed, weight control, BP monitoring, regular exercise, and medications. Discussed med's effects and SE's. Screening labs and tests as requested with regular follow-up as recommended.

## 2015-05-06 ENCOUNTER — Other Ambulatory Visit: Payer: Self-pay | Admitting: Internal Medicine

## 2015-05-06 LAB — URINALYSIS, MICROSCOPIC ONLY
BACTERIA UA: NONE SEEN [HPF]
Casts: NONE SEEN [LPF]
Crystals: NONE SEEN [HPF]
RBC / HPF: NONE SEEN RBC/HPF (ref <=2)
SQUAMOUS EPITHELIAL / LPF: NONE SEEN [HPF] (ref <=5)
YEAST: NONE SEEN [HPF]

## 2015-05-06 LAB — MICROALBUMIN / CREATININE URINE RATIO
Creatinine, Urine: 156 mg/dL (ref 20–320)
MICROALB/CREAT RATIO: 4 ug/mg{creat} (ref <30)
Microalb, Ur: 0.6 mg/dL

## 2015-05-06 LAB — URINALYSIS, ROUTINE W REFLEX MICROSCOPIC
Bilirubin Urine: NEGATIVE
GLUCOSE, UA: NEGATIVE
Hgb urine dipstick: NEGATIVE
Ketones, ur: NEGATIVE
NITRITE: NEGATIVE
PROTEIN: NEGATIVE
Specific Gravity, Urine: 1.016 (ref 1.001–1.035)
pH: 5 (ref 5.0–8.0)

## 2015-05-06 LAB — BASIC METABOLIC PANEL WITH GFR
BUN: 8 mg/dL (ref 7–25)
CHLORIDE: 102 mmol/L (ref 98–110)
CO2: 20 mmol/L (ref 20–31)
Calcium: 9.3 mg/dL (ref 8.6–10.4)
Creat: 0.85 mg/dL (ref 0.50–0.99)
GFR, EST NON AFRICAN AMERICAN: 73 mL/min (ref >=60)
GFR, Est African American: 84 mL/min (ref >=60)
Glucose, Bld: 110 mg/dL — ABNORMAL HIGH (ref 65–99)
POTASSIUM: 3.9 mmol/L (ref 3.5–5.3)
SODIUM: 140 mmol/L (ref 135–146)

## 2015-05-06 LAB — HEPATIC FUNCTION PANEL
ALK PHOS: 80 U/L (ref 33–130)
ALT: 8 U/L (ref 6–29)
AST: 15 U/L (ref 10–35)
Albumin: 3.9 g/dL (ref 3.6–5.1)
BILIRUBIN DIRECT: 0.1 mg/dL (ref <=0.2)
BILIRUBIN INDIRECT: 0.5 mg/dL (ref 0.2–1.2)
BILIRUBIN TOTAL: 0.6 mg/dL (ref 0.2–1.2)
Total Protein: 6.3 g/dL (ref 6.1–8.1)

## 2015-05-06 LAB — LIPID PANEL
CHOL/HDL RATIO: 5.8 ratio — AB (ref <=5.0)
Cholesterol: 292 mg/dL — ABNORMAL HIGH (ref 125–200)
HDL: 50 mg/dL (ref >=46)
LDL CALC: 211 mg/dL — AB (ref <130)
TRIGLYCERIDES: 153 mg/dL — AB (ref <150)
VLDL: 31 mg/dL — AB (ref <30)

## 2015-05-06 LAB — VITAMIN D 25 HYDROXY (VIT D DEFICIENCY, FRACTURES): Vit D, 25-Hydroxy: 23 ng/mL — ABNORMAL LOW (ref 30–100)

## 2015-05-06 LAB — HEMOGLOBIN A1C
Hgb A1c MFr Bld: 6.6 % — ABNORMAL HIGH (ref <5.7)
MEAN PLASMA GLUCOSE: 143 mg/dL — AB (ref <117)

## 2015-05-06 LAB — INSULIN, RANDOM: Insulin: 13.1 u[IU]/mL (ref 2.0–19.6)

## 2015-05-06 LAB — MAGNESIUM: Magnesium: 1.5 mg/dL (ref 1.5–2.5)

## 2015-05-06 LAB — TSH: TSH: 2.097 u[IU]/mL (ref 0.350–4.500)

## 2015-05-09 ENCOUNTER — Other Ambulatory Visit: Payer: Self-pay | Admitting: Internal Medicine

## 2015-05-28 ENCOUNTER — Other Ambulatory Visit: Payer: Self-pay | Admitting: Internal Medicine

## 2015-06-23 ENCOUNTER — Other Ambulatory Visit: Payer: Self-pay | Admitting: *Deleted

## 2015-06-23 DIAGNOSIS — Z0001 Encounter for general adult medical examination with abnormal findings: Secondary | ICD-10-CM

## 2015-06-23 DIAGNOSIS — Z1212 Encounter for screening for malignant neoplasm of rectum: Secondary | ICD-10-CM

## 2015-06-23 LAB — POC HEMOCCULT BLD/STL (HOME/3-CARD/SCREEN)
Card #3 Fecal Occult Blood, POC: NEGATIVE
FECAL OCCULT BLD: NEGATIVE
Fecal Occult Blood, POC: NEGATIVE

## 2015-08-04 ENCOUNTER — Other Ambulatory Visit: Payer: Self-pay | Admitting: Internal Medicine

## 2015-08-04 ENCOUNTER — Other Ambulatory Visit: Payer: Self-pay

## 2015-08-04 MED ORDER — QUETIAPINE FUMARATE 300 MG PO TABS: 300.0000 mg | ORAL_TABLET | Freq: Every day | ORAL | Status: DC

## 2015-08-07 ENCOUNTER — Ambulatory Visit: Payer: Self-pay | Admitting: Internal Medicine

## 2015-08-11 ENCOUNTER — Encounter: Payer: Self-pay | Admitting: Internal Medicine

## 2015-08-11 ENCOUNTER — Ambulatory Visit: Payer: Self-pay | Admitting: Internal Medicine

## 2015-08-11 ENCOUNTER — Ambulatory Visit (INDEPENDENT_AMBULATORY_CARE_PROVIDER_SITE_OTHER): Payer: Medicare Other | Admitting: Internal Medicine

## 2015-08-11 VITALS — BP 136/82 | HR 98 | Temp 98.2°F | Resp 18 | Ht 64.0 in | Wt 158.0 lb

## 2015-08-11 DIAGNOSIS — Z79899 Other long term (current) drug therapy: Secondary | ICD-10-CM | POA: Diagnosis not present

## 2015-08-11 DIAGNOSIS — Z1239 Encounter for other screening for malignant neoplasm of breast: Secondary | ICD-10-CM

## 2015-08-11 DIAGNOSIS — Z23 Encounter for immunization: Secondary | ICD-10-CM

## 2015-08-11 DIAGNOSIS — F411 Generalized anxiety disorder: Secondary | ICD-10-CM

## 2015-08-11 DIAGNOSIS — J309 Allergic rhinitis, unspecified: Secondary | ICD-10-CM | POA: Diagnosis not present

## 2015-08-11 DIAGNOSIS — Z0000 Encounter for general adult medical examination without abnormal findings: Secondary | ICD-10-CM

## 2015-08-11 DIAGNOSIS — Z0001 Encounter for general adult medical examination with abnormal findings: Secondary | ICD-10-CM | POA: Diagnosis not present

## 2015-08-11 DIAGNOSIS — F329 Major depressive disorder, single episode, unspecified: Secondary | ICD-10-CM | POA: Diagnosis not present

## 2015-08-11 DIAGNOSIS — G2581 Restless legs syndrome: Secondary | ICD-10-CM

## 2015-08-11 DIAGNOSIS — G308 Other Alzheimer's disease: Secondary | ICD-10-CM | POA: Diagnosis not present

## 2015-08-11 DIAGNOSIS — Z6824 Body mass index (BMI) 24.0-24.9, adult: Secondary | ICD-10-CM | POA: Diagnosis not present

## 2015-08-11 DIAGNOSIS — E559 Vitamin D deficiency, unspecified: Secondary | ICD-10-CM

## 2015-08-11 DIAGNOSIS — E782 Mixed hyperlipidemia: Secondary | ICD-10-CM

## 2015-08-11 DIAGNOSIS — E1129 Type 2 diabetes mellitus with other diabetic kidney complication: Secondary | ICD-10-CM | POA: Diagnosis not present

## 2015-08-11 DIAGNOSIS — R6889 Other general symptoms and signs: Secondary | ICD-10-CM

## 2015-08-11 DIAGNOSIS — E1121 Type 2 diabetes mellitus with diabetic nephropathy: Secondary | ICD-10-CM | POA: Diagnosis not present

## 2015-08-11 DIAGNOSIS — F0280 Dementia in other diseases classified elsewhere without behavioral disturbance: Secondary | ICD-10-CM

## 2015-08-11 DIAGNOSIS — G301 Alzheimer's disease with late onset: Secondary | ICD-10-CM

## 2015-08-11 DIAGNOSIS — I10 Essential (primary) hypertension: Secondary | ICD-10-CM | POA: Diagnosis not present

## 2015-08-11 DIAGNOSIS — E039 Hypothyroidism, unspecified: Secondary | ICD-10-CM

## 2015-08-11 DIAGNOSIS — F32A Depression, unspecified: Secondary | ICD-10-CM

## 2015-08-11 LAB — HEPATIC FUNCTION PANEL
ALT: 10 U/L (ref 6–29)
AST: 17 U/L (ref 10–35)
Albumin: 3.9 g/dL (ref 3.6–5.1)
Alkaline Phosphatase: 77 U/L (ref 33–130)
BILIRUBIN DIRECT: 0.1 mg/dL (ref <=0.2)
BILIRUBIN INDIRECT: 0.9 mg/dL (ref 0.2–1.2)
TOTAL PROTEIN: 6.5 g/dL (ref 6.1–8.1)
Total Bilirubin: 1 mg/dL (ref 0.2–1.2)

## 2015-08-11 LAB — CBC WITH DIFFERENTIAL/PLATELET
BASOS PCT: 1 % (ref 0–1)
Basophils Absolute: 0.1 10*3/uL (ref 0.0–0.1)
Eosinophils Absolute: 0.2 10*3/uL (ref 0.0–0.7)
Eosinophils Relative: 4 % (ref 0–5)
HEMATOCRIT: 40 % (ref 36.0–46.0)
HEMOGLOBIN: 13.4 g/dL (ref 12.0–15.0)
LYMPHS ABS: 1.4 10*3/uL (ref 0.7–4.0)
LYMPHS PCT: 24 % (ref 12–46)
MCH: 29.3 pg (ref 26.0–34.0)
MCHC: 33.5 g/dL (ref 30.0–36.0)
MCV: 87.5 fL (ref 78.0–100.0)
MONO ABS: 0.5 10*3/uL (ref 0.1–1.0)
MONOS PCT: 9 % (ref 3–12)
MPV: 8.5 fL — ABNORMAL LOW (ref 8.6–12.4)
NEUTROS ABS: 3.7 10*3/uL (ref 1.7–7.7)
NEUTROS PCT: 62 % (ref 43–77)
Platelets: 289 10*3/uL (ref 150–400)
RBC: 4.57 MIL/uL (ref 3.87–5.11)
RDW: 13.3 % (ref 11.5–15.5)
WBC: 6 10*3/uL (ref 4.0–10.5)

## 2015-08-11 LAB — BASIC METABOLIC PANEL WITH GFR
BUN: 12 mg/dL (ref 7–25)
CALCIUM: 9.7 mg/dL (ref 8.6–10.4)
CHLORIDE: 102 mmol/L (ref 98–110)
CO2: 28 mmol/L (ref 20–31)
CREATININE: 0.92 mg/dL (ref 0.50–0.99)
GFR, Est African American: 77 mL/min (ref >=60)
GFR, Est Non African American: 66 mL/min (ref >=60)
GLUCOSE: 124 mg/dL — AB (ref 65–99)
Potassium: 4.3 mmol/L (ref 3.5–5.3)
SODIUM: 139 mmol/L (ref 135–146)

## 2015-08-11 LAB — TSH: TSH: 2.98 m[IU]/L

## 2015-08-11 LAB — LIPID PANEL
Cholesterol: 330 mg/dL — ABNORMAL HIGH (ref 125–200)
HDL: 46 mg/dL (ref >=46)
LDL CALC: 253 mg/dL — AB (ref <130)
Total CHOL/HDL Ratio: 7.2 Ratio — ABNORMAL HIGH (ref <=5.0)
Triglycerides: 156 mg/dL — ABNORMAL HIGH (ref <150)
VLDL: 31 mg/dL — AB (ref <30)

## 2015-08-11 LAB — HEMOGLOBIN A1C
Hgb A1c MFr Bld: 6.2 % — ABNORMAL HIGH (ref <5.7)
MEAN PLASMA GLUCOSE: 131 mg/dL — AB (ref <117)

## 2015-08-11 MED ORDER — ATORVASTATIN CALCIUM 80 MG PO TABS: ORAL_TABLET | ORAL | Status: DC

## 2015-08-11 MED ORDER — MELOXICAM 15 MG PO TABS: 15.0000 mg | ORAL_TABLET | Freq: Every day | ORAL | Status: DC

## 2015-08-11 MED ORDER — MOMETASONE FUROATE 50 MCG/ACT NA SUSP: 2.0000 | Freq: Every day | NASAL | Status: DC

## 2015-08-11 NOTE — Progress Notes (Signed)
Patient ID: Marie [name], female   DOB: [birth year], 65 y.o.   MRN: [id number]  MEDICARE ANNUAL WELLNESS VISIT AND FOLLOW UP  Assessment:    1. Essential hypertension -dash diet -monitor at home -exercise   2. Type 2 diabetes mellitus with diabetic nephropathy, without long-term current use of insulin (HCC) -cont metformin - Hemoglobin A1c  3. Hypothyroidism, unspecified hypothyroidism type  - TSH  4. Hyperlipidemia  - atorvastatin (LIPITOR) 80 MG tablet; TAKE ONE TABLET BY MOUTH AT BEDTIME FOR CHOLESTEROL  Dispense: 90 tablet; Refill: 1 - Lipid panel  5. Depression, controlled -followed by Dr. Toy Care  6. Vitamin D deficiency -cont supplement  7. Medication management  - CBC with Differential/Platelet - BASIC METABOLIC PANEL WITH GFR - Hepatic function panel  8. Allergic rhinitis, unspecified allergic rhinitis type -cont zyrtec - mometasone (NASONEX) 50 MCG/ACT nasal spray; Place 2 sprays into the nose daily.  Dispense: 17 g; Refill: 2  9. SDAT  -cont namenda  10. RESTLESS LEGS SYNDROME -currently well controlled off requip  11. Generalized anxiety disorder -seen by Dr. Toy Care  12. Body mass index (BMI) of 24.0-24.9 in adult -cont diet and exercise  13. Screening for breast cancer  - MM DIGITAL SCREENING BILATERAL; Future     Over 30 minutes of exam, counseling, chart review, and critical decision making was performed  Plan:   During the course of the visit the patient was educated and counseled about appropriate screening and preventive services including:    Pneumococcal vaccine   Influenza vaccine  Td vaccine  Prevnar 13  Screening electrocardiogram  Screening mammography  Bone densitometry screening  Colorectal cancer screening  Diabetes screening  Glaucoma screening  Nutrition counseling   Advanced directives: given info/requested copies  Conditions/risks identified: Diabetes is at goal, ACE/ARB therapy: No, Reason not on  Ace Inhibitor/ARB therapy:  not indicated. Urinary Incontinence is not an issue: discussed non pharmacology and pharmacology options.  Fall risk: low- discussed PT, home fall assessment, medications.    Subjective:   Marie [name] is a 65 y.o. female who presents for Medicare Annual Wellness Visit and 3 month follow up on hypertension, diabetes, hyperlipidemia, vitamin D def.  Date of last medicare wellness visit was 3/16.   Her blood pressure has been controlled at home, today their BP is BP: 136/82 mmHg She does not workout. She denies chest pain, shortness of breath, dizziness.    She is not on cholesterol medication and denies myalgias. Her cholesterol is not at goal. The cholesterol last visit was:   Lab Results  Component Value Date   CHOL 292* 05/05/2015   HDL 50 05/05/2015   LDLCALC 211* 05/05/2015   TRIG 153* 05/05/2015   CHOLHDL 5.8* 05/05/2015  She has not been taking the lipitor.    She has been working on diet and exercise for prediabetes, and denies foot ulcerations, hyperglycemia, hypoglycemia , increased appetite, nausea, paresthesia of the feet, polydipsia, polyuria, visual disturbances, vomiting and weight loss. Last A1C in the office was:  Lab Results  Component Value Date   HGBA1C 6.6* 05/05/2015   Last GFR NonAA   Lab Results  Component Value Date   [facility] 73 05/05/2015   AA  Lab Results  Component Value Date   GFRAA 84 05/05/2015   Patient is on Vitamin D supplement. Lab Results  Component Value Date   VD25OH 79* 05/05/2015     She reports that she is under a lot more stress again as her Granddaughter is  now back in the house as her mother was arrested.  She is not walking anymore and she is not feeling like doing anything.  She is still seeing Dr. Toy Care.  She reports that she recently changed her sleeping medication and she is taking trazodone now instead of the seroquel.    She does report that she has had some soreness of her breastbone.   She reports that her lab dog has been pulling her around a lot on her leash and she thinks that is contributing to it.    Medication Review Current Outpatient Prescriptions on File Prior to Visit  Medication Sig Dispense Refill  . Blood Glucose Monitoring Suppl (ONETOUCH VERIO) W/DEVICE KIT 1 kit by Does not apply route as needed. Use to check glucose QD 1 kit 0  . cetirizine (ZYRTEC) 10 MG tablet TAKE 1 TABLET BY MOUTH EVERY NIGHT AT BEDTIME 90 tablet 1  . DULoxetine (CYMBALTA) 60 MG capsule Take 1 capsule (60 mg total) by mouth daily. 90 capsule 3  . glucose blood (FREESTYLE LITE) test strip Test sugar once daily DX E11.29 50 each 4  . glucose blood (ONETOUCH VERIO) test strip Check blood glucose 1 time daily for fluctuating blood sugars. 100 each 2  . memantine (NAMENDA XR) 28 MG CP24 24 hr capsule Take 1 capsule (28 mg total) by mouth daily at 6 PM. 90 capsule 3  . metFORMIN (GLUCOPHAGE-XR) 500 MG 24 hr tablet TAKE 2 TABLETS BY MOUTH 2 TIMES A DAY AFTER MEALS 60 tablet 2  . ONE TOUCH LANCETS MISC Use as directed to check glucose daily. 100 each 1   No current facility-administered medications on file prior to visit.    Current Problems (verified) Patient Active Problem List   Diagnosis Date Noted  . Body mass index (BMI) of 24.0-24.9 in adult 05/05/2015  . Generalized anxiety disorder 08/04/2014  . Essential hypertension 04/28/2014  . Medication management 04/28/2014  . Vitamin D deficiency 10/04/2013  . 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 Depression, controlled   . SDAT    . Hypothyroidism   . RESTLESS LEGS SYNDROME 08/03/2007  . Hyperlipidemia 07/28/2007  . Allergic rhinitis 07/28/2007    Screening Tests Immunization History  Administered Date(s) Administered  . DT 11/25/2005  . Pneumococcal Conjugate-13 08/04/2014  . Pneumococcal Polysaccharide-23 03/28/1999  . Zoster 04/28/2014    Preventative care: Last colonoscopy:  2013 Last mammogram: 2013, order put  in.  Will alert Marie [name] Last pap smear/pelvic exam: 2013, recommended at next physical DEXA:2013, declined  Prior vaccinations: TD or Tdap: Due today  Influenza: Declined  Pneumococcal: Declined Prevnar13: Declined Shingles/Zostavax: 2015  Names of Other Physician/Practitioners you currently use: 1. [facility]- here for primary care 2. Dr. Idolina Primer, eye doctor, last visit due next week 3. Not seeing one currently, dentist, last visit 1.5 years ago Patient Care Team: [name], MD as PCP - General (Internal Medicine) Chucky May, MD as Consulting Physician (Psychiatry)  Past Surgical History  Procedure Laterality Date  . 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 Fracture surgery  2009    right arm; rod   Family History  Problem Relation Age of Onset  . Colon cancer Neg Hx   . Stomach cancer Neg Hx    Social History  Substance Use Topics  . Smoking status: Never Smoker   . Smokeless tobacco: Never Used  . Alcohol Use: No    MEDICARE WELLNESS  OBJECTIVES: Tobacco use: She does not smoke.  Patient is not a former smoker. If yes, counseling given Alcohol Current alcohol use: none Osteoporosis: postmenopausal estrogen deficiency, History of fracture in the past year: no Fall risk: Low Risk Hearing: normal Visual acuity: normal,  does perform annual eye exam Diet: well balanced Physical activity: Current Exercise Habits:: Home exercise routine, Type of exercise: walking, Time (Minutes): 30, Frequency (Times/Week): 5, Weekly Exercise (Minutes/Week): 150, Intensity: Moderate Cardiac risk factors: Cardiac Risk Factors include: diabetes mellitus;dyslipidemia;family history of premature cardiovascular disease;hypertension;obesity (BMI >30kg/m2);sedentary lifestyle Depression/mood screen:   Depression screen [facility] 2/9 08/11/2015  Decreased Interest 1  Down, Depressed, Hopeless 1  PHQ - 2 Score 2  Altered sleeping 0  Tired, decreased  energy 1  Change in appetite 0  Trouble concentrating 1  Moving slowly or fidgety/restless 1  Suicidal thoughts 0  PHQ-9 Score 5  Difficult doing work/chores Somewhat difficult    ADLs:  In your present state of health, do you have any difficulty performing the following activities: 08/11/2015 05/05/2015  Hearing? N N  Vision? N N  Difficulty concentrating or making decisions? N Y  Walking or climbing stairs? N N  Dressing or bathing? N N  Doing errands, shopping? N N  Preparing Food and eating ? N -  Using the Toilet? N -  In the past six months, have you accidently leaked urine? N -  Do you have problems with loss of bowel control? N -  Managing your Medications? N -  Managing your Finances? N -  Housekeeping or managing your Housekeeping? N -     Cognitive Testing  Alert? Yes  Normal Appearance?Yes  Oriented to person? Yes  Place? Yes   Time? Yes  Recall of three objects?  Yes  Can perform simple calculations? Yes  Displays appropriate judgment?Yes  Can read the correct time from a watch face?Yes  EOL planning: Does patient have an advance directive?: No Would patient like information on creating an advanced directive?: Yes - Educational materials given   Objective:   Today's Vitals   08/11/15 0956  BP: 136/82  Pulse: 98  Temp: 98.2 F (36.8 C)  TempSrc: Temporal  Resp: 18  Height: 5' 4"  (1.626 m)  Weight: 158 lb (71.668 kg)   Body mass index is 27.11 kg/(m^2).  General appearance: alert, no distress, WD/WN,  female HEENT: normocephalic, sclerae anicteric, TMs pearly, nares patent, no discharge or erythema, pharynx normal Oral cavity: MMM, no lesions Neck: supple, no lymphadenopathy, no thyromegaly, no masses Heart: RRR, normal S1, S2, no murmurs Lungs: CTA bilaterally, no wheezes, rhonchi, or rales.  Chest tenderness over the breastbone Abdomen: +bs, soft, non tender, non distended, no masses, no hepatomegaly, no splenomegaly Musculoskeletal: nontender, no  swelling, no obvious deformity Extremities: no edema, no cyanosis, no clubbing Pulses: 2+ symmetric, upper and lower extremities, normal cap refill Neurological: alert, oriented x 3, CN2-12 intact, strength normal upper extremities and lower extremities, sensation normal throughout, DTRs 2+ throughout, no cerebellar signs, gait normal Psychiatric: normal affect, behavior normal, pleasant  Breast: defer Gyn: defer Rectal: defer   Medicare Attestation I have personally reviewed: The patient's medical and social history Their use of alcohol, tobacco or illicit drugs Their current medications and supplements The patient's functional ability including ADLs,fall risks, home safety risks, cognitive, and hearing and visual impairment Diet and physical activities Evidence for depression or mood disorders  The patient's weight, height, BMI, and visual acuity have been recorded in the chart.  I  have made referrals, counseling, and provided education to the patient based on review of the above and I have provided the patient with a written personalized care plan for preventive services.     Starlyn Skeans, PA-C   08/11/2015

## 2015-08-15 ENCOUNTER — Other Ambulatory Visit: Payer: Self-pay | Admitting: Internal Medicine

## 2015-08-22 ENCOUNTER — Emergency Department (HOSPITAL_COMMUNITY): Payer: Medicare Other

## 2015-08-22 ENCOUNTER — Emergency Department (HOSPITAL_COMMUNITY)
Admission: EM | Admit: 2015-08-22 | Discharge: 2015-08-22 | Disposition: A | Discharge status: Home / Self Care | Payer: Medicare Other | Attending: Emergency Medicine | Admitting: Emergency Medicine

## 2015-08-22 ENCOUNTER — Encounter (HOSPITAL_COMMUNITY): Payer: Self-pay | Admitting: Emergency Medicine

## 2015-08-22 DIAGNOSIS — F329 Major depressive disorder, single episode, unspecified: Secondary | ICD-10-CM | POA: Insufficient documentation

## 2015-08-22 DIAGNOSIS — R079 Chest pain, unspecified: Secondary | ICD-10-CM | POA: Diagnosis not present

## 2015-08-22 DIAGNOSIS — Z7984 Long term (current) use of oral hypoglycemic drugs: Secondary | ICD-10-CM | POA: Insufficient documentation

## 2015-08-22 DIAGNOSIS — Z79899 Other long term (current) drug therapy: Secondary | ICD-10-CM | POA: Insufficient documentation

## 2015-08-22 DIAGNOSIS — I10 Essential (primary) hypertension: Secondary | ICD-10-CM | POA: Insufficient documentation

## 2015-08-22 DIAGNOSIS — Z7951 Long term (current) use of inhaled steroids: Secondary | ICD-10-CM | POA: Diagnosis not present

## 2015-08-22 DIAGNOSIS — Z880 Allergy status to penicillin: Secondary | ICD-10-CM | POA: Insufficient documentation

## 2015-08-22 DIAGNOSIS — F0390 Unspecified dementia without behavioral disturbance: Secondary | ICD-10-CM | POA: Insufficient documentation

## 2015-08-22 DIAGNOSIS — R0789 Other chest pain: Secondary | ICD-10-CM | POA: Insufficient documentation

## 2015-08-22 DIAGNOSIS — E119 Type 2 diabetes mellitus without complications: Secondary | ICD-10-CM | POA: Diagnosis not present

## 2015-08-22 LAB — I-STAT TROPONIN, ED: TROPONIN I, POC: 0 ng/mL (ref 0.00–0.08)

## 2015-08-22 LAB — BASIC METABOLIC PANEL
ANION GAP: 8 (ref 5–15)
BUN: 9 mg/dL (ref 6–20)
CO2: 23 mmol/L (ref 22–32)
CREATININE: 0.88 mg/dL (ref 0.44–1.00)
Calcium: 9 mg/dL (ref 8.9–10.3)
Chloride: 106 mmol/L (ref 101–111)
GFR calc non Af Amer: >60 mL/min (ref >60)
Glucose, Bld: 139 mg/dL — ABNORMAL HIGH (ref 65–99)
Potassium: 3.8 mmol/L (ref 3.5–5.1)
Sodium: 137 mmol/L (ref 135–145)

## 2015-08-22 LAB — CBC
HEMATOCRIT: 36.9 % (ref 36.0–46.0)
Hemoglobin: 12.4 g/dL (ref 12.0–15.0)
MCH: 29.4 pg (ref 26.0–34.0)
MCHC: 33.6 g/dL (ref 30.0–36.0)
MCV: 87.4 fL (ref 78.0–100.0)
Platelets: 282 10*3/uL (ref 150–400)
RBC: 4.22 MIL/uL (ref 3.87–5.11)
RDW: 12.8 % (ref 11.5–15.5)
WBC: 6.7 10*3/uL (ref 4.0–10.5)

## 2015-08-22 NOTE — ED Provider Notes (Signed)
CSN: [id number]     Arrival date & time 08/22/15  0802 History   First MD Initiated Contact with Patient 08/22/15 [phone]     Chief Complaint  Patient presents with  . Chest Pain   HPI   Marie [name] is a 65 y.o. female PMH significant for hypertension, diabetes, hyperlipidemia presenting with a 3-4 week history of chest pain. She describes her chest pain as 7 out of 10 pain scale, constant, sore, worsened with palpation and movement, nonexertional, left sided in location, nonradiating. She endorses recent pneumonia a few weeks ago with an associated cough but this has since resolved. She feels this is more muscle related; however she was seen at her primary care provider's office last week for the same symptoms but they told her to follow-up or return to the ER if her symptoms worsened. She states she has "been under a lot of stress recently." She states they have gotten worse over the last few days. She is taken ibuprofen at home without relief. She does not take it daily. She denies shortness of breath, fevers, chills, cough, nausea, vomiting, history of myocardial infarction, changes in bowel or bladder habits.  Past Medical History  Diagnosis Date  . Diabetes mellitus   . Type II or unspecified type diabetes mellitus without mention of complication, not stated as uncontrolled   . Depression   . Hypertension   . Dementia   . Vitamin D deficiency   . Thyroid disease    Past Surgical History  Procedure Laterality Date  . 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 Fracture surgery  2009    right arm; rod   Family History  Problem Relation Age of Onset  . Colon cancer Neg Hx   . Stomach cancer Neg Hx    Social History  Substance Use Topics  . Smoking status: Never Smoker   . Smokeless tobacco: Never Used  . Alcohol Use: No   OB History    No data available     Review of Systems  Ten systems are reviewed and are negative for acute change except as noted in the  HPI  Allergies  Penicillins  Home Medications   Prior to Admission medications   Medication Sig Start Date End Date Taking? Authorizing Provider  atorvastatin (LIPITOR) 80 MG tablet TAKE ONE TABLET BY MOUTH AT BEDTIME FOR CHOLESTEROL 08/11/15   Courtney Forcucci, PA-C  Blood Glucose Monitoring Suppl (ONETOUCH VERIO) W/DEVICE KIT 1 kit by Does not apply route as needed. Use to check glucose QD 12/30/13   [name], MD  cetirizine (ZYRTEC) 10 MG tablet TAKE 1 TABLET BY MOUTH EVERY NIGHT AT BEDTIME 05/09/15   [name], MD  DULoxetine (CYMBALTA) 60 MG capsule Take 1 capsule (60 mg total) by mouth daily. 10/22/14   [name], MD  glucose blood (FREESTYLE LITE) test strip Test sugar once daily DX E11.29 08/21/14   Vicie Mutters, PA-C  glucose blood (ONETOUCH VERIO) test strip Check blood glucose 1 time daily for fluctuating blood sugars. 10/09/14   [name], MD  meloxicam (MOBIC) 15 MG tablet Take 1 tablet (15 mg total) by mouth daily. 08/11/15   Courtney Forcucci, PA-C  memantine (NAMENDA XR) 28 MG CP24 24 hr capsule Take 1 capsule (28 mg total) by mouth daily at 6 PM. 10/22/14   [name], MD  metFORMIN (GLUCOPHAGE-XR) 500 MG 24 hr tablet TAKE 2 TABLETS BY MOUTH 2 TIMES A DAY AFTER MEALS 08/15/15  [name], MD  mometasone (NASONEX) 50 MCG/ACT nasal spray Place 2 sprays into the nose daily. 08/11/15 08/10/16  Courtney Forcucci, PA-C  ONE TOUCH LANCETS MISC Use as directed to check glucose daily. 06/10/14   [name], MD  traZODone (DESYREL) 50 MG tablet Take 100 mg by mouth at bedtime.    Historical Provider, MD   BP 126/80 mmHg  Pulse 85  Temp(Src) 97.7 F (36.5 C) (Oral)  Resp 18  Ht 5' 5"  (1.651 m)  Wt 70.96 kg  BMI 26.03 kg/m2  SpO2 98% Physical Exam  Constitutional: She appears well-developed and well-nourished. No distress.  HENT:  Head: Normocephalic and atraumatic.  Mouth/Throat: Oropharynx is clear and moist. No oropharyngeal exudate.  Eyes:  Conjunctivae are normal. Pupils are equal, round, and reactive to light. Right eye exhibits no discharge. Left eye exhibits no discharge. No scleral icterus.  Neck: No tracheal deviation present.  Cardiovascular: Normal rate, regular rhythm, normal heart sounds and intact distal pulses.  Exam reveals no gallop and no friction rub.   No murmur heard. Pulmonary/Chest: Effort normal and breath sounds normal. No respiratory distress. She has no wheezes. She has no rales. She exhibits tenderness.    Abdominal: Soft. Bowel sounds are normal. She exhibits no distension and no mass. There is no tenderness. There is no rebound and no guarding.  Musculoskeletal: She exhibits no edema.  Lymphadenopathy:    She has no cervical adenopathy.  Neurological: She is alert. Coordination normal.  Skin: Skin is warm and dry. No rash noted. She is not diaphoretic. No erythema.  Psychiatric: She has a normal mood and affect. Her behavior is normal.  Nursing note and vitals reviewed.   ED Course  Procedures  Labs Review Labs Reviewed  BASIC METABOLIC PANEL - Abnormal; Notable for the following:    Glucose, Bld 139 (*)    All other components within normal limits  CBC  I-STAT TROPOININ, ED   Imaging Review Dg Chest 2 View  08/22/2015  CLINICAL DATA:  Chest pain EXAM: CHEST  2 VIEW COMPARISON:  01/26/2012 FINDINGS: The heart size and mediastinal contours are within normal limits. Both lungs are clear. No acute bony abnormality. Hardware noted within the visualized right humerus. IMPRESSION: No active cardiopulmonary disease. Electronically Signed   By: Rolm Baptise M.D.   On: 08/22/2015 09:27   I have personally reviewed and evaluated these images and lab results as part of my medical decision-making.   EKG Interpretation   Date/Time:  Saturday August 22 2015 08:16:36 EST Ventricular Rate:  85 PR Interval:  132 QRS Duration: 78 QT Interval:  378 QTC Calculation: 449 R Axis:   52 Text  Interpretation:  Normal sinus rhythm Normal ECG No significant change  since last tracing Confirmed by [facility] MD, ERIN ([id number]) on 08/22/2015  10:17:49 AM      MDM   Final diagnoses:  Chest pain, unspecified chest pain type   Patient nontoxic appearing, vital signs stable. Based on patient history and physical exam, most likely etiologies include anxiety vs MSK; however, given patient risk factors will evaluate for ACS. Less likely etiologies include ACS, Prinzmetal's/cocaine-induced angina, pericarditis/pericardial effusion, cardiac tamponade, constrictive pericarditis, myocarditis, aortic dissection, thoracic aortic aneurysm, CHF/acute pulmonary edema, pneumonia, pneumothorax, tension pneumothorax, pulmonary embolism, pulmonary HTN, GERD, esophageal spasm, Mallory-Weiss tear, Boerhaave syndrome, peptic ulcer diease, biliary disease, pancreatitis, herpes zoster, sickle cell chest crisis. Troponin, BNP, CBC, EKG, chest x-ray unremarkable. Discussed case with Dr. Billy Fischer who advised patient is stable for discharge  with strict return precautions and PCP follow-up within 1 week.   [place], PA-C 08/22/15 [place], MD 08/23/15 [phone]

## 2015-08-22 NOTE — ED Notes (Addendum)
Pt c/o chest soreness ongoing for 1 week. Pt reports that she went to her PMD who felt that she pulled a muscle. Pt reports that soreness continues. Pt denies any other symptoms.

## 2015-08-22 NOTE — Discharge Instructions (Signed)
Ms. KANI MONA,  Nice meeting you! Please follow-up with your primary care provider. You may take 800 mg of ibuprofen (four 200 mg tablets) three times a day with meals for 1-2 weeks. Return to the emergency department if you have increasing chest pain, shortness of breath, or develop nausea/vomiting. Feel better soon!  S. Wendie Simmer, PA-C

## 2015-09-02 ENCOUNTER — Ambulatory Visit (INDEPENDENT_AMBULATORY_CARE_PROVIDER_SITE_OTHER): Payer: Medicare Other | Admitting: Physician Assistant

## 2015-09-02 ENCOUNTER — Encounter: Payer: Self-pay | Admitting: Physician Assistant

## 2015-09-02 VITALS — BP 130/80 | HR 91 | Temp 97.2°F | Resp 16 | Ht 64.0 in | Wt 156.0 lb

## 2015-09-02 DIAGNOSIS — J0100 Acute maxillary sinusitis, unspecified: Secondary | ICD-10-CM | POA: Diagnosis not present

## 2015-09-02 MED ORDER — PREDNISONE 20 MG PO TABS: ORAL_TABLET | ORAL | Status: DC

## 2015-09-02 MED ORDER — AZITHROMYCIN 250 MG PO TABS: ORAL_TABLET | ORAL | Status: AC

## 2015-09-02 NOTE — Patient Instructions (Addendum)
Go to the ER if any CP, SOB, nausea, dizziness, severe HA, changes vision/speech  Get back on zyrtec/certizine Continue to use your nasal spray  Please take the prednisone to help decrease inflammation and therefore decrease symptoms. Take it it with food to avoid GI upset. It can cause increased energy but on the other hand it can make it hard to sleep at night so please take it AT [place], it takes 8-12 hours to start working so it will NOT affect your sleeping if you take it at night with your food!!  If you are diabetic it will increase your sugars so decrease carbs and monitor your sugars closely.    HOW TO TREAT VIRAL COUGH AND COLD SYMPTOMS:  -Symptoms usually last at least 1 week with the worst symptoms being around day 4.  - colds usually start with a sore throat and end with a cough, and the cough can take 2 weeks to get better.  -No antibiotics are needed for colds, flu, sore throats, cough, bronchitis UNLESS symptoms are longer than 7 days OR if you are getting better then get drastically worse.  -There are a lot of combination medications (Dayquil, Nyquil, Vicks 44, tyelnol cold and sinus, ETC). Please look at the ingredients on the back so that you are treating the correct symptoms and not doubling up on medications/ingredients.    Medicines you can use  Nasal congestion  - pseudoephedrine (Sudafed)- behind the counter, do not use if you have high blood pressure, medicine that have -D in them.  - phenylephrine (Sudafed PE) -Dextormethorphan + chlorpheniramine (Coridcidin HBP)- okay if you have high blood pressure -Oxymetazoline (Afrin) nasal spray- LIMIT to 3 days -Saline nasal spray -Neti pot (used distilled or bottled water)  Ear pain/congestion  -pseudoephedrine (sudafed) - Nasonex/flonase nasal spray  Fever  -Acetaminophen (Tyelnol) -Ibuprofen (Advil, motrin, aleve)  Sore Throat  -Acetaminophen (Tyelnol) -Ibuprofen (Advil, motrin, aleve) -Drink a lot of  water -Gargle with salt water - Rest your voice (don't talk) -Throat sprays -Cough drops  Body Aches  -Acetaminophen (Tyelnol) -Ibuprofen (Advil, motrin, aleve)  Headache  -Acetaminophen (Tyelnol) -Ibuprofen (Advil, motrin, aleve) - Exedrin, Exedrin Migraine  Allergy symptoms (cough, sneeze, runny nose, itchy eyes) -Claritin or loratadine cheapest but likely the weakest  -Zyrtec or certizine at night because it can make you sleepy -The strongest is allegra or fexafinadine  Cheapest at [employer], [employer], [employer]  Cough  -Dextromethorphan (Delsym)- medicine that has DM in it -Guafenesin (Mucinex/Robitussin) - cough drops - drink lots of water  Chest Congestion  -Guafenesin (Mucinex/Robitussin)  Red Itchy Eyes  - Naphcon-A  Upset Stomach  - Bland diet (nothing spicy, greasy, fried, and high acid foods like tomatoes, oranges, berries) -OKAY- cereal, bread, soup, crackers, rice -Eat smaller more frequent meals -reduce caffeine, no alcohol -Loperamide (Imodium-AD) if diarrhea -Prevacid for heart burn  General health when sick  -Hydration -wash your hands frequently -keep surfaces clean -change pillow cases and sheets often -Get fresh air but do not exercise strenuously -Vitamin D, double up on it - Vitamin C -Zinc

## 2015-09-02 NOTE — Progress Notes (Signed)
Subjective:    Patient ID: Marie [name], female    DOB: 1950/08/06, 65 y.o.   MRN: [id number]  HPI 65 y.o. WF with ear pain, sinus congestion. Has had ear pain, ringing in her ears, sore throat, sinus congestion. No fever, chills, daughter has been sick as well. No changes in vision, no weakness.   Blood pressure 130/80, pulse 91, temperature 97.2 F (36.2 C), temperature source Temporal, resp. rate 16, height _0  (1.626 m), weight 156 lb (70.761 kg), SpO2 96 %.  Current Outpatient Prescriptions on File Prior to Visit  Medication Sig Dispense Refill  . atorvastatin (LIPITOR) 80 MG tablet TAKE ONE TABLET BY MOUTH AT BEDTIME FOR CHOLESTEROL 90 tablet 1  . Blood Glucose Monitoring Suppl (ONETOUCH VERIO) W/DEVICE KIT 1 kit by Does not apply route as needed. Use to check glucose QD 1 kit 0  . cholecalciferol (VITAMIN D) 1000 units tablet Take 1,000 Units by mouth daily.    . DULoxetine (CYMBALTA) 60 MG capsule Take 1 capsule (60 mg total) by mouth daily. 90 capsule 3  . glucose blood (FREESTYLE LITE) test strip Test sugar once daily DX E11.29 50 each 4  . meloxicam (MOBIC) 15 MG tablet Take 1 tablet (15 mg total) by mouth daily. 14 tablet 0  . memantine (NAMENDA XR) 28 MG CP24 24 hr capsule Take 1 capsule (28 mg total) by mouth daily at 6 PM. 90 capsule 3  . metFORMIN (GLUCOPHAGE-XR) 500 MG 24 hr tablet TAKE 2 TABLETS BY MOUTH 2 TIMES A DAY AFTER MEALS 360 tablet 0  . mometasone (NASONEX) 50 MCG/ACT nasal spray Place 2 sprays into the nose daily. 17 g 2  . ONE TOUCH LANCETS MISC Use as directed to check glucose daily. 100 each 1  . traZODone (DESYREL) 50 MG tablet Take 100 mg by mouth at bedtime.     No current facility-administered medications on file prior to visit.   Past Medical History  Diagnosis Date  . Diabetes mellitus   . Type II or unspecified type diabetes mellitus without mention of complication, not stated as uncontrolled   . Depression   . Hypertension   . Dementia    . Vitamin D deficiency   . Thyroid disease     Review of Systems  Constitutional: Negative for chills and diaphoresis.  HENT: Positive for congestion, ear pain, postnasal drip, sinus pressure, sneezing and tinnitus. Negative for hearing loss and sore throat.   Respiratory: Positive for cough. Negative for chest tightness, shortness of breath and wheezing.   Cardiovascular: Negative.   Gastrointestinal: Negative.   Genitourinary: Negative.   Musculoskeletal: Negative for neck pain.  Neurological: Positive for headaches.       Objective:   Physical Exam  Constitutional: She is oriented to person, place, and time. She appears well-developed and well-nourished.  HENT:  Right Ear: Hearing and external ear normal. No mastoid tenderness. Tympanic membrane is injected. Tympanic membrane is not perforated, not erythematous, not retracted and not bulging. A middle ear effusion is present.  Left Ear: Hearing and external ear normal. No mastoid tenderness. Tympanic membrane is injected. Tympanic membrane is not perforated, not erythematous, not retracted and not bulging. A middle ear effusion is present.  Nose: Right sinus exhibits maxillary sinus tenderness. Left sinus exhibits maxillary sinus tenderness.  Mouth/Throat: Uvula is midline, oropharynx is clear and moist and mucous membranes are normal.  Eyes: Conjunctivae and EOM are normal. Pupils are equal, round, and reactive to light.  Neck: Neck  supple.  Cardiovascular: Normal rate and regular rhythm.   Pulmonary/Chest: Effort normal and breath sounds normal. No respiratory distress. She has no wheezes.  Abdominal: Soft. Bowel sounds are normal.  Musculoskeletal: Normal range of motion.  Lymphadenopathy:    She has no cervical adenopathy.  Neurological: She is alert and oriented to person, place, and time.  Skin: Skin is warm and dry.       Assessment & Plan:  1. Acute maxillary sinusitis, recurrence not specified Allergy pill,  flonase, go to ER if any HA, confusion, weakness, changes in vision, follow up in not better.  - predniSONE (DELTASONE) 20 MG tablet; 2 tablets daily for 3 days, 1 tablet daily for 4 days.  Dispense: 10 tablet; Refill: 0 - azithromycin (ZITHROMAX) 250 MG tablet; Take 2 tablets (500 mg) on  Day 1,  followed by 1 tablet (250 mg) once daily on Days 2 through 5.  Dispense: 6 each; Refill: 1

## 2015-11-09 ENCOUNTER — Ambulatory Visit: Payer: Self-pay | Admitting: Internal Medicine

## 2015-11-14 ENCOUNTER — Emergency Department (HOSPITAL_COMMUNITY): Payer: Medicare Other

## 2015-11-14 ENCOUNTER — Emergency Department (HOSPITAL_COMMUNITY)
Admission: EM | Admit: 2015-11-14 | Discharge: 2015-11-15 | Disposition: A | Discharge status: Home / Self Care | Payer: Medicare Other | Attending: Emergency Medicine | Admitting: Emergency Medicine

## 2015-11-14 ENCOUNTER — Encounter (HOSPITAL_COMMUNITY): Payer: Self-pay

## 2015-11-14 DIAGNOSIS — Z791 Long term (current) use of non-steroidal anti-inflammatories (NSAID): Secondary | ICD-10-CM | POA: Diagnosis not present

## 2015-11-14 DIAGNOSIS — R112 Nausea with vomiting, unspecified: Secondary | ICD-10-CM | POA: Insufficient documentation

## 2015-11-14 DIAGNOSIS — E119 Type 2 diabetes mellitus without complications: Secondary | ICD-10-CM | POA: Diagnosis not present

## 2015-11-14 DIAGNOSIS — F329 Major depressive disorder, single episode, unspecified: Secondary | ICD-10-CM | POA: Insufficient documentation

## 2015-11-14 DIAGNOSIS — F0390 Unspecified dementia without behavioral disturbance: Secondary | ICD-10-CM | POA: Insufficient documentation

## 2015-11-14 DIAGNOSIS — I10 Essential (primary) hypertension: Secondary | ICD-10-CM | POA: Diagnosis not present

## 2015-11-14 DIAGNOSIS — R079 Chest pain, unspecified: Secondary | ICD-10-CM | POA: Diagnosis not present

## 2015-11-14 DIAGNOSIS — Z79899 Other long term (current) drug therapy: Secondary | ICD-10-CM | POA: Insufficient documentation

## 2015-11-14 DIAGNOSIS — E559 Vitamin D deficiency, unspecified: Secondary | ICD-10-CM | POA: Diagnosis not present

## 2015-11-14 DIAGNOSIS — R0789 Other chest pain: Secondary | ICD-10-CM | POA: Diagnosis not present

## 2015-11-14 DIAGNOSIS — Z880 Allergy status to penicillin: Secondary | ICD-10-CM | POA: Insufficient documentation

## 2015-11-14 DIAGNOSIS — Z7984 Long term (current) use of oral hypoglycemic drugs: Secondary | ICD-10-CM | POA: Diagnosis not present

## 2015-11-14 LAB — I-STAT TROPONIN, ED: TROPONIN I, POC: 0 ng/mL (ref 0.00–0.08)

## 2015-11-14 LAB — CBC
HEMATOCRIT: 38.9 % (ref 36.0–46.0)
HEMOGLOBIN: 13 g/dL (ref 12.0–15.0)
MCH: 29.4 pg (ref 26.0–34.0)
MCHC: 33.4 g/dL (ref 30.0–36.0)
MCV: 88 fL (ref 78.0–100.0)
Platelets: 268 10*3/uL (ref 150–400)
RBC: 4.42 MIL/uL (ref 3.87–5.11)
RDW: 12.9 % (ref 11.5–15.5)
WBC: 6 10*3/uL (ref 4.0–10.5)

## 2015-11-14 LAB — BASIC METABOLIC PANEL
ANION GAP: 11 (ref 5–15)
BUN: 11 mg/dL (ref 6–20)
CALCIUM: 9.5 mg/dL (ref 8.9–10.3)
CO2: 27 mmol/L (ref 22–32)
Chloride: 100 mmol/L — ABNORMAL LOW (ref 101–111)
Creatinine, Ser: 0.94 mg/dL (ref 0.44–1.00)
GFR calc Af Amer: >60 mL/min (ref >60)
GFR calc non Af Amer: >60 mL/min (ref >60)
GLUCOSE: 171 mg/dL — AB (ref 65–99)
Potassium: 3.6 mmol/L (ref 3.5–5.1)
Sodium: 138 mmol/L (ref 135–145)

## 2015-11-14 NOTE — ED Notes (Signed)
Pt is in xray

## 2015-11-14 NOTE — ED Provider Notes (Signed)
CSN: [id number]     Arrival date & time 11/14/15  2113 History   First MD Initiated Contact with Patient 11/14/15 2203     Chief Complaint  Patient presents with  . Chest Pain     (Consider location/radiation/quality/duration/timing/severity/associated sxs/prior Treatment) HPI Comments: Patient with history of hypertension, diabetes, high cholesterol, frequent episodes of chest pains -- presents with c/o CP. Patient has been having mid and left sided chest pain for several weeks. Symptoms were worse today. She described a sharp pain in her left chest that started this morning (patient cannot give an exact time). Pain does not radiate. No significant associated shortness of breath. Patient thinks that she vomited one time. Currently she only has some mild soreness in her chest that hurts worse when you push on the area. She did not have associated diaphoresis or palpitations. No lightheadedness. No abdominal pain. Family wonders "could this be stress?" States that she has been off anxiety medicine for almost a year. Patient states that she walks her [place] twice a day and does not have pain with activity. The onset of this condition was acute. The course is constant. Aggravating factors: none. Alleviating factors: none.    Patient is a 65 y.o. female presenting with chest pain. The history is provided by the patient and medical records.  Chest Pain Associated symptoms: nausea and vomiting   Associated symptoms: no abdominal pain, no back pain, no cough, no diaphoresis, no fever, no palpitations and no shortness of breath     Past Medical History  Diagnosis Date  . Diabetes mellitus   . Type II or unspecified type diabetes mellitus without mention of complication, not stated as uncontrolled   . Depression   . Hypertension   . Dementia   . Vitamin D deficiency   . Thyroid disease    Past Surgical History  Procedure Laterality Date  . 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  Fracture surgery  2009    right arm; rod   Family History  Problem Relation Age of Onset  . Colon cancer Neg Hx   . Stomach cancer Neg Hx    Social History  Substance Use Topics  . Smoking status: Never Smoker   . Smokeless tobacco: Never Used  . Alcohol Use: No   OB History    No data available     Review of Systems  Constitutional: Negative for fever and diaphoresis.  Eyes: Negative for redness.  Respiratory: Negative for cough and shortness of breath.   Cardiovascular: Positive for chest pain. Negative for palpitations and leg swelling.  Gastrointestinal: Positive for nausea and vomiting. Negative for abdominal pain.  Genitourinary: Negative for dysuria.  Musculoskeletal: Negative for back pain and neck pain.  Skin: Negative for rash.  Neurological: Negative for syncope and light-headedness.  Psychiatric/Behavioral: The patient is not nervous/anxious.       Allergies  Penicillins  Home Medications   Prior to Admission medications   Medication Sig Start Date End Date Taking? Authorizing Provider  atorvastatin (LIPITOR) 80 MG tablet TAKE ONE TABLET BY MOUTH AT BEDTIME FOR CHOLESTEROL 08/11/15   Courtney Forcucci, PA-C  Blood Glucose Monitoring Suppl (ONETOUCH VERIO) W/DEVICE KIT 1 kit by Does not apply route as needed. Use to check glucose QD 12/30/13   [name], MD  cholecalciferol (VITAMIN D) 1000 units tablet Take 1,000 Units by mouth daily.    Historical Provider, MD  DULoxetine (CYMBALTA) 60 MG capsule Take 1 capsule (60  mg total) by mouth daily. 10/22/14   [name], MD  glucose blood (FREESTYLE LITE) test strip Test sugar once daily DX E11.29 08/21/14   Vicie Mutters, PA-C  meloxicam (MOBIC) 15 MG tablet Take 1 tablet (15 mg total) by mouth daily. 08/11/15   Courtney Forcucci, PA-C  memantine (NAMENDA XR) 28 MG CP24 24 hr capsule Take 1 capsule (28 mg total) by mouth daily at 6 PM. 10/22/14   [name], MD  metFORMIN (GLUCOPHAGE-XR) 500 MG 24 hr  tablet TAKE 2 TABLETS BY MOUTH 2 TIMES A DAY AFTER MEALS 08/15/15   [name], MD  mometasone (NASONEX) 50 MCG/ACT nasal spray Place 2 sprays into the nose daily. 08/11/15 08/10/16  Courtney Forcucci, PA-C  ONE TOUCH LANCETS MISC Use as directed to check glucose daily. 06/10/14   [name], MD  predniSONE (DELTASONE) 20 MG tablet 2 tablets daily for 3 days, 1 tablet daily for 4 days. 09/02/15   Vicie Mutters, PA-C  traZODone (DESYREL) 50 MG tablet Take 100 mg by mouth at bedtime.    Historical Provider, MD   BP 147/92 mmHg  Pulse 85  Temp(Src) 97.8 F (36.6 C) (Oral)  Resp 20  SpO2 99% Physical Exam  Constitutional: She appears well-developed and well-nourished.  HENT:  Head: Normocephalic and atraumatic.  Mouth/Throat: Mucous membranes are normal. Mucous membranes are not dry.  Eyes: Conjunctivae are normal.  Neck: Trachea normal and normal range of motion. Neck supple. Normal carotid pulses and no JVD present. No muscular tenderness present. Carotid bruit is not present. No tracheal deviation present.  Cardiovascular: Normal rate, regular rhythm, S1 normal, S2 normal, normal heart sounds and intact distal pulses.  Exam reveals no decreased pulses.   No murmur heard. Pulmonary/Chest: Effort normal. No respiratory distress. She has no wheezes. She exhibits tenderness.    Abdominal: Soft. Normal aorta and bowel sounds are normal. There is no tenderness. There is no rebound and no guarding.  Musculoskeletal: Normal range of motion.  Neurological: She is alert.  Skin: Skin is warm and dry. She is not diaphoretic. No cyanosis. No pallor.  Psychiatric: She has a normal mood and affect.  Nursing note and vitals reviewed.   ED Course  Procedures (including critical care time) Labs Review Labs Reviewed  BASIC METABOLIC PANEL - Abnormal; Notable for the following:    Chloride 100 (*)    Glucose, Bld 171 (*)    All other components within normal limits  CBC  I-STAT TROPOININ,  ED  Randolm Idol, ED    Imaging Review Dg Chest 2 View  11/14/2015  CLINICAL DATA:  Subacute onset of left chest pain for 3 weeks. Vomiting. Initial encounter. EXAM: CHEST  2 VIEW COMPARISON:  Chest radiograph performed 08/22/2015 FINDINGS: The lungs are well-aerated and clear. There is no evidence of focal opacification, pleural effusion or pneumothorax. The heart is normal in size; the mediastinal contour is within normal limits. No acute osseous abnormalities are seen. IMPRESSION: No acute cardiopulmonary process seen. Electronically Signed   By: Garald Balding M.D.   On: 11/14/2015 21:57   I have personally reviewed and evaluated these images and lab results as part of my medical decision-making.  ED ECG REPORT   Date: 11/14/2015  Rate: 76  Rhythm: normal sinus rhythm  QRS Axis: normal  Intervals: normal  ST/T Wave abnormalities: normal  Conduction Disutrbances:none  Narrative Interpretation:   Old EKG Reviewed: unchanged  I have personally reviewed the EKG tracing and agree with the computerized printout  as noted.   10:07 PM Patient is not yet in room. EKG reviewed. Previous records reviewed.   Vital signs reviewed and are as follows: BP 147/92 mmHg  Pulse 85  Temp(Src) 97.8 F (36.6 C) (Oral)  Resp 20  SpO2 99%  10:25 PM patient seen and examined. She has a history very similar to that from visit in 07/2015. Chest pain is nonexertional and atypical for ACS, however patient does have risk factors and the heart score = 3. Will perform delta troponin delta EKG. If this is negative, feel the patient can likely be discharged to home. I will give her cardiology referral for further evaluation, possible stress testing to further risk stratify her especially since her symptoms have been ongoing for some time.  1:50 AM Delta EKG and troponin are negative. Will d/c to home with cardiology referral.   Patient discussed with Dr. Johnney Killian earlier.   Patient was counseled to  return with severe chest pain, especially if the pain is crushing or pressure-like and spreads to the arms, back, neck, or jaw, or if they have sweating, nausea, or shortness of breath with the pain. They were encouraged to call 911 with these symptoms.   They were also told to return if their chest pain gets worse and does not go away with rest, they have an attack of chest pain lasting longer than usual despite rest and treatment with the medications their caregiver has prescribed, if they wake from sleep with chest pain or shortness of breath, if they feel dizzy or faint, if they have chest pain not typical of their usual pain, or if they have any other emergent concerns regarding their health.  The patient verbalized understanding and agreed.    MDM   Final diagnoses:  Chest wall pain   Patient with reproducible, atypical CP. Feel patient is low risk for ACS given history (poor story for ACS/MI), negative troponin(s), normal/unchanged EKG. Given recurrent nature of sx and risk factor profile, patient would likely benefit from cardiology evaluation. Do not suspect PE, dissection given this presentation.   No dangerous or life-threatening conditions suspected or identified by history, physical exam, and by work-up. No indications for hospitalization identified.        Carlisle Cater, PA-C 11/15/15 0153  Charlesetta Shanks, MD 11/19/15 [phone]

## 2015-11-14 NOTE — ED Notes (Signed)
Pt reports intermittant left chest pain x 3 weeks, onset today pain has been worse and constant.  Pt vomited x 1 today and has ringing in the ears.  No nausea, diaphoresis, blurred vision or dizziness.

## 2015-11-15 DIAGNOSIS — R0789 Other chest pain: Secondary | ICD-10-CM | POA: Diagnosis not present

## 2015-11-15 LAB — I-STAT TROPONIN, ED: Troponin i, poc: 0 ng/mL (ref 0.00–0.08)

## 2015-11-15 NOTE — Discharge Instructions (Signed)
Please read and follow all provided instructions.  Your diagnoses today include:  1. Chest wall pain    Tests performed today include:  An EKG of your heart  A chest x-ray  Cardiac enzymes - a blood test for heart muscle damage  Blood counts and electrolytes  Vital signs. See below for your results today.   Medications prescribed:   None  Take any prescribed medications only as directed.  Follow-up instructions: Please follow-up with your primary care provider and the heart doctor listed as soon as you can for further evaluation of your symptoms.   Return instructions:  SEEK IMMEDIATE MEDICAL ATTENTION IF:  You have severe chest pain, especially if the pain is crushing or pressure-like and spreads to the arms, back, neck, or jaw, or if you have sweating, nausea (feeling sick to your stomach), or shortness of breath. THIS IS AN EMERGENCY. Don't wait to see if the pain will go away. Get medical help at once. Call 911 or 0 (operator). DO NOT drive yourself to the hospital.   Your chest pain gets worse and does not go away with rest.   You have an attack of chest pain lasting longer than usual, despite rest and treatment with the medications your caregiver has prescribed.   You wake from sleep with chest pain or shortness of breath.  You feel dizzy or faint.  You have chest pain not typical of your usual pain for which you originally saw your caregiver.   You have any other emergent concerns regarding your health.  Additional Information: Chest pain comes from many different causes. Your caregiver has diagnosed you as having chest pain that is not specific for one problem, but does not require admission.  You are at low risk for an acute heart condition or other serious illness.   Your vital signs today were: BP 129/74 mmHg   Pulse 70   Temp(Src) 97.8 F (36.6 C) (Oral)   Resp 20   SpO2 95% If your blood pressure (BP) was elevated above 135/85 this visit, please have this  repeated by your doctor within one month. --------------

## 2015-11-15 NOTE — ED Notes (Signed)
Pt is in stable condition upon d/c and ambulates from ED. 

## 2015-11-18 ENCOUNTER — Encounter: Payer: Self-pay | Admitting: Internal Medicine

## 2015-11-18 ENCOUNTER — Ambulatory Visit (INDEPENDENT_AMBULATORY_CARE_PROVIDER_SITE_OTHER): Payer: Medicare Other | Admitting: Internal Medicine

## 2015-11-18 VITALS — BP 128/88 | HR 76 | Temp 97.8°F | Resp 16 | Ht 64.0 in | Wt 155.2 lb

## 2015-11-18 DIAGNOSIS — G308 Other Alzheimer's disease: Secondary | ICD-10-CM | POA: Diagnosis not present

## 2015-11-18 DIAGNOSIS — G301 Alzheimer's disease with late onset: Secondary | ICD-10-CM

## 2015-11-18 DIAGNOSIS — N182 Chronic kidney disease, stage 2 (mild): Secondary | ICD-10-CM | POA: Diagnosis not present

## 2015-11-18 DIAGNOSIS — E559 Vitamin D deficiency, unspecified: Secondary | ICD-10-CM | POA: Diagnosis not present

## 2015-11-18 DIAGNOSIS — E039 Hypothyroidism, unspecified: Secondary | ICD-10-CM

## 2015-11-18 DIAGNOSIS — I10 Essential (primary) hypertension: Secondary | ICD-10-CM | POA: Diagnosis not present

## 2015-11-18 DIAGNOSIS — E1129 Type 2 diabetes mellitus with other diabetic kidney complication: Secondary | ICD-10-CM | POA: Diagnosis not present

## 2015-11-18 DIAGNOSIS — E782 Mixed hyperlipidemia: Secondary | ICD-10-CM

## 2015-11-18 DIAGNOSIS — F0280 Dementia in other diseases classified elsewhere without behavioral disturbance: Secondary | ICD-10-CM

## 2015-11-18 DIAGNOSIS — Z79899 Other long term (current) drug therapy: Secondary | ICD-10-CM | POA: Diagnosis not present

## 2015-11-18 DIAGNOSIS — E1122 Type 2 diabetes mellitus with diabetic chronic kidney disease: Secondary | ICD-10-CM

## 2015-11-18 LAB — CBC WITH DIFFERENTIAL/PLATELET
BASOS PCT: 1 %
Basophils Absolute: 49 cells/uL (ref 0–200)
EOS PCT: 3 %
Eosinophils Absolute: 147 cells/uL (ref 15–500)
HCT: 40.4 % (ref 35.0–45.0)
HEMOGLOBIN: 13.8 g/dL (ref 11.7–15.5)
LYMPHS ABS: 1225 {cells}/uL (ref 850–3900)
Lymphocytes Relative: 25 %
MCH: 29.9 pg (ref 27.0–33.0)
MCHC: 34.2 g/dL (ref 32.0–36.0)
MCV: 87.6 fL (ref 80.0–100.0)
MPV: 8.6 fL (ref 7.5–12.5)
Monocytes Absolute: 490 cells/uL (ref 200–950)
Monocytes Relative: 10 %
NEUTROS ABS: 2989 {cells}/uL (ref 1500–7800)
Neutrophils Relative %: 61 %
Platelets: 295 10*3/uL (ref 140–400)
RBC: 4.61 MIL/uL (ref 3.80–5.10)
RDW: 13.7 % (ref 11.0–15.0)
WBC: 4.9 10*3/uL (ref 3.8–10.8)

## 2015-11-18 LAB — TSH: TSH: 1.89 mIU/L

## 2015-11-18 NOTE — Patient Instructions (Signed)

## 2015-11-18 NOTE — Progress Notes (Signed)
Patient ID: Marie [name], female   DOB: 12-May-1951, 65 y.o.   MRN: [id number]  [facility]                       [name], M.D.        Uvaldo Bristle. Silverio Lay, P.A.-C       Starlyn Skeans, P.A.-C   [facility]                [address] [place] [id number] Telephone [phone] Telefax [phone] _________________________________________________________________________   This very nice 65 y.o. MWF presents for  follow up with Hypertension, Hyperlipidemia, Pre-Diabetes and Vitamin D Deficiency. Patient was recently seen and evaluated in the ER for CP with final Dx of Chest Wall Pain. Note patient has been on SS Disability since 2015 for Depression, dementia, anxiety/panic attacks and is followed by Dr Toy Care.    Patient is treated for HTN since 2010 & BP has been controlled and today's BP: 128/88 mmHg. Patient has had no complaints of any cardiac type chest pain, palpitations, dyspnea/orthopnea/PND, dizziness, claudication, or dependent edema.   Hyperlipidemia is not controlled with diet & meds. Patient denies myalgias or other med SE's. Last Lipids were not at goal attributed to poor medication & dietary compliance  with Cholesterol 330*; HDL 46; LDL 253*; Triglycerides 156* on 08/11/2015.   Also, the patient has history of T2_NIDDM since circa 2000 and has had no symptoms of reactive hypoglycemia, diabetic polys, paresthesias or visual blurring.  Last A1c was 6.2% on  08/11/2015.    Further, the patient also has history of Vitamin D Deficiency of "29" in 2008 and supplements vitamin D without any suspected side-effects. Last vitamin D was still low at  23 on 05/05/2015.  Medication Sig  . atorvastatin  80 MG  TAKE ONE TABLET BY MOUTH AT BEDTIME FOR CHOLESTEROL  . VITAMIN D 1000 units  Take 1,000 Units by mouth daily.  . DULoxetine  60 MG  Take 1 capsule (60 mg total) by mouth daily.  Marland Kitchen NAMENDA  XR 28 MG  Take 1 capsule (28 mg total) by mouth daily at 6 PM.  . metFORMIN-XR 500 MG  TAKE 2 TABLETS BY MOUTH 2 TIMES A DAY AFTER MEALS  . traZODone 50 MG tablet Take 50 mg by mouth at bedtime.   . meloxicam 15 MG tablet Take 1 tablet (15 mg total) by mouth daily.  Marland Kitchen NASONEX  nasal spray Place 2 sprays into the nose daily. (Patient taking differently: Place 2 sprays into the nose at bedtime. )  . predniSONE  20 MG tablet 2 tablets daily for 3 days, 1 tablet daily for 4 days. From ER   Allergies  Allergen Reactions  . Penicillins Rash    Swelling in face - last 10 years   PMHx:   Past Medical History  Diagnosis Date  . Diabetes mellitus   . Type II or unspecified type diabetes mellitus without mention of complication, not stated as uncontrolled   . Depression   . Hypertension   . Dementia   . Vitamin D deficiency   . Thyroid disease    Immunization History  Administered Date(s) Administered  . DT 11/25/2005, 08/11/2015  . Pneumococcal Conjugate-13 08/04/2014  . Pneumococcal Polysaccharide-23 03/28/1999  . Zoster 04/28/2014   Past Surgical History  Procedure Laterality Date  .  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 Fracture surgery  2009    right arm; rod   FHx:    Reviewed / unchanged  SHx:    Reviewed / unchanged  Systems Review:  Constitutional: Denies fever, chills, wt changes, headaches, insomnia, fatigue, night sweats, change in appetite. Eyes: Denies redness, blurred vision, diplopia, discharge, itchy, watery eyes.  ENT: Denies discharge, congestion, post nasal drip, epistaxis, sore throat, earache, hearing loss, dental pain, tinnitus, vertigo, sinus pain, snoring.  CV: Denies palpitations, irregular heartbeat, syncope, dyspnea, diaphoresis, orthopnea, PND, claudication or edema. Respiratory: denies cough, dyspnea, DOE, pleurisy, hoarseness, laryngitis, wheezing.  Gastrointestinal: Denies dysphagia, odynophagia, heartburn, reflux, water brash, abdominal  pain or cramps, nausea, vomiting, bloating, diarrhea, constipation, hematemesis, melena, hematochezia  or hemorrhoids. Genitourinary: Denies dysuria, frequency, urgency, nocturia, hesitancy, discharge, hematuria or flank pain. Musculoskeletal: Denies arthralgias, myalgias, stiffness, jt. swelling, pain, limping or strain/sprain.  Skin: Denies pruritus, rash, hives, warts, acne, eczema or change in skin lesion(s). Neuro: No weakness, tremor, incoordination, spasms, paresthesia or pain. Psychiatric: Denies confusion, memory loss or sensory loss. Endo: Denies change in weight, skin or hair change.  Heme/Lymph: No excessive bleeding, bruising or enlarged lymph nodes.  Physical Exam  BP 128/88 mmHg  Pulse 76  Temp(Src) 97.8 F (36.6 C)  Resp 16  Ht 5\' 4"  (1.626 m)  Wt 155 lb 3.2 oz (70.398 kg)  BMI 26.63 kg/m2  Appears well nourished and in no distress.  Eyes: PERRLA, EOMs, conjunctiva no swelling or erythema. Sinuses: No frontal/maxillary tenderness ENT/Mouth: EAC's clear, TM's nl w/o erythema, bulging. Nares clear w/o erythema, swelling, exudates. Oropharynx clear without erythema or exudates. Oral hygiene is good. Tongue normal, non obstructing. Hearing intact.  Neck: Supple. Thyroid nl. Car 2+/2+ without bruits, nodes or JVD. Chest: Respirations nl with BS clear & equal w/o rales, rhonchi, wheezing or stridor.  Cor: Heart sounds normal w/ regular rate and rhythm without sig. murmurs, gallops, clicks, or rubs. Peripheral pulses normal and equal  without edema.  Abdomen: Soft & bowel sounds normal. Non-tender w/o guarding, rebound, hernias, masses, or organomegaly.  Lymphatics: Unremarkable.  Musculoskeletal: Full ROM all peripheral extremities, joint stability, 5/5 strength, and normal gait.  Skin: Warm, dry without exposed rashes, lesions or ecchymosis apparent.  Neuro: Cranial nerves intact, reflexes equal bilaterally. Sensory-motor testing grossly intact. Tendon reflexes grossly  intact.  Pysch: Alert & anxious, but oriented x 3.  Insight and judgement nl & appropriate. No ideations.  Assessment and Plan:  1. Essential hypertension  - TSH  2. Hyperlipidemia  - Lipid panel - TSH  3. Type 2 diabetes mellitus with stage 2 chronic kidney disease, without long-term current use of insulin (HCC)  - Hemoglobin A1c - Insulin, random  4. Vitamin D deficiency  - VITAMIN D 25 Hydroxy   5. SDAT    6. Hypothyroidism,  - TSH  7. Medication management  - CBC with Differential/Platelet - BASIC METABOLIC PANEL WITH GFR - Hepatic function panel - Magnesium   Recommended regular exercise, BP monitoring, weight control, and discussed med and SE's. Recommended labs to assess and monitor clinical status. Further disposition pending results of labs. Over 30 minutes of exam, counseling, chart review was performed

## 2015-11-19 LAB — BASIC METABOLIC PANEL WITH GFR
BUN: 10 mg/dL (ref 7–25)
CALCIUM: 9.8 mg/dL (ref 8.6–10.4)
CO2: 27 mmol/L (ref 20–31)
Chloride: 102 mmol/L (ref 98–110)
Creat: 0.94 mg/dL (ref 0.50–0.99)
GFR, EST NON AFRICAN AMERICAN: 64 mL/min (ref >=60)
GFR, Est African American: 74 mL/min (ref >=60)
Glucose, Bld: 112 mg/dL — ABNORMAL HIGH (ref 65–99)
Potassium: 4.2 mmol/L (ref 3.5–5.3)
SODIUM: 139 mmol/L (ref 135–146)

## 2015-11-19 LAB — HEPATIC FUNCTION PANEL
ALT: 12 U/L (ref 6–29)
AST: 18 U/L (ref 10–35)
Albumin: 4.2 g/dL (ref 3.6–5.1)
Alkaline Phosphatase: 94 U/L (ref 33–130)
BILIRUBIN DIRECT: 0.2 mg/dL (ref <=0.2)
BILIRUBIN INDIRECT: 1 mg/dL (ref 0.2–1.2)
Total Bilirubin: 1.2 mg/dL (ref 0.2–1.2)
Total Protein: 6.9 g/dL (ref 6.1–8.1)

## 2015-11-19 LAB — LIPID PANEL
CHOL/HDL RATIO: 3.7 ratio (ref <=5.0)
CHOLESTEROL: 188 mg/dL (ref 125–200)
HDL: 51 mg/dL (ref >=46)
LDL Cholesterol: 106 mg/dL (ref <130)
TRIGLYCERIDES: 155 mg/dL — AB (ref <150)
VLDL: 31 mg/dL — AB (ref <30)

## 2015-11-19 LAB — HEMOGLOBIN A1C
HEMOGLOBIN A1C: 6.5 % — AB (ref <5.7)
Mean Plasma Glucose: 140 mg/dL

## 2015-11-19 LAB — INSULIN, RANDOM: Insulin: 8 u[IU]/mL (ref 2.0–19.6)

## 2015-11-19 LAB — VITAMIN D 25 HYDROXY (VIT D DEFICIENCY, FRACTURES): Vit D, 25-Hydroxy: 31 ng/mL (ref 30–100)

## 2015-11-19 LAB — MAGNESIUM: Magnesium: 1.8 mg/dL (ref 1.5–2.5)

## 2015-12-07 ENCOUNTER — Other Ambulatory Visit: Payer: Self-pay | Admitting: Internal Medicine

## 2015-12-07 DIAGNOSIS — E119 Type 2 diabetes mellitus without complications: Secondary | ICD-10-CM | POA: Diagnosis not present

## 2015-12-07 DIAGNOSIS — H9319 Tinnitus, unspecified ear: Secondary | ICD-10-CM

## 2015-12-17 ENCOUNTER — Ambulatory Visit: Payer: Self-pay | Admitting: Physician Assistant

## 2015-12-22 ENCOUNTER — Ambulatory Visit (INDEPENDENT_AMBULATORY_CARE_PROVIDER_SITE_OTHER): Payer: Medicare Other | Admitting: Internal Medicine

## 2015-12-22 ENCOUNTER — Encounter: Payer: Self-pay | Admitting: Internal Medicine

## 2015-12-22 VITALS — BP 148/84 | HR 112 | Temp 98.2°F | Resp 16 | Ht 64.0 in | Wt 156.0 lb

## 2015-12-22 DIAGNOSIS — H6591 Unspecified nonsuppurative otitis media, right ear: Secondary | ICD-10-CM | POA: Diagnosis not present

## 2015-12-22 MED ORDER — PREDNISONE 20 MG PO TABS: ORAL_TABLET | ORAL | Status: DC

## 2015-12-22 NOTE — Progress Notes (Signed)
   Subjective:    Patient ID: Marie [name], female    DOB: Mar 01, 1951, 65 y.o.   MRN: [id number]  Dizziness Associated symptoms include headaches. Pertinent negatives include no abdominal pain, chest pain, chills, congestion, fatigue, fever, nausea, sore throat, vomiting or weakness.   Patient presents to the office for evaluation of right ear pain for the past month.  She reports that she was told a month ago and she had some humming in her ear and also some aching to the right ear.  She reports that she tried sudafed and also tried some drops to put in her ear and also took an OTC product which they do not know what it was.  She reports some runny nose which is clear.  She has had some congestion. NO sore throat trouble swallowing. No coughing.  She does note that she is dizzy with rapid movements. She feels off kilter.  No vertigo.     Review of Systems  Constitutional: Negative for fever, chills and fatigue.  HENT: Positive for ear pain. Negative for congestion, ear discharge, hearing loss, postnasal drip, rhinorrhea, sinus pressure and sore throat.   Respiratory: Negative for chest tightness and shortness of breath.   Cardiovascular: Negative for chest pain and palpitations.  Gastrointestinal: Negative for nausea, vomiting, abdominal pain, diarrhea, constipation and blood in stool.  Neurological: Positive for dizziness and headaches. Negative for speech difficulty, weakness and light-headedness.       Objective:   Physical Exam  Constitutional: She is oriented to person, place, and time. She appears well-developed and well-nourished. No distress.  HENT:  Head: Normocephalic.  Right Ear: A middle ear effusion is present.  Nose: Mucosal edema present. Right sinus exhibits no maxillary sinus tenderness and no frontal sinus tenderness. Left sinus exhibits no maxillary sinus tenderness and no frontal sinus tenderness.  Mouth/Throat: Uvula is midline, oropharynx is clear and moist and  mucous membranes are normal. No trismus in the jaw. No oropharyngeal exudate.  Eyes: Conjunctivae are normal. No scleral icterus.  Neck: Normal range of motion. Neck supple. No JVD present. No thyromegaly present.  Abdominal: Soft. Bowel sounds are normal.  Musculoskeletal: Normal range of motion.  Lymphadenopathy:    She has no cervical adenopathy.  Neurological: She is alert and oriented to person, place, and time.  Skin: Skin is warm and dry. She is not diaphoretic.  Psychiatric: She has a normal mood and affect. Her behavior is normal. Judgment and thought content normal.  Nursing note and vitals reviewed.   Filed Vitals:   12/22/15 1348  BP: 148/84  Pulse: 112  Temp: 98.2 F (36.8 C)  Resp: 16         Assessment & Plan:    1. Middle ear effusion, right -prednisone -nasonex -benadryl -zyrtec -nasal saline -wear mask in yard

## 2015-12-22 NOTE — Patient Instructions (Signed)
Please start taking prednisone until it is completely gone.  Follow the instructions on the bottle.  Please use nasonex nasal spray 2 sprays per nostril right before bedtime.   Please use zyrtec, claritin, or allegra daily.  Please take 2 tablets of benadryl (50 mg) right before bedtime.  Please use saline to help rinse your nose out.    You can use sudafed up to 3 times a day if you need to help with your ear.    If you don't improve in a week then we will get you to the ENT.

## 2015-12-30 ENCOUNTER — Encounter: Payer: Self-pay | Admitting: Internal Medicine

## 2015-12-30 ENCOUNTER — Ambulatory Visit (INDEPENDENT_AMBULATORY_CARE_PROVIDER_SITE_OTHER): Payer: Medicare Other | Admitting: Internal Medicine

## 2015-12-30 VITALS — BP 142/82 | HR 72 | Temp 97.5°F | Resp 16 | Ht 64.0 in | Wt 158.6 lb

## 2015-12-30 DIAGNOSIS — H6591 Unspecified nonsuppurative otitis media, right ear: Secondary | ICD-10-CM

## 2015-12-30 MED ORDER — PSEUDOEPHEDRINE HCL ER 120 MG PO TB12: ORAL_TABLET | ORAL | Status: AC

## 2015-12-30 NOTE — Progress Notes (Signed)
  Subjective:    Patient ID: Marie [name], female    DOB: [birth year], 65 y.o.   MRN: [id number]  HPI This very nice 65 yo MWF with HTN, T2_DM, HLD and Hypothyroidism was seen about a week ago & treated for a Rt Serous otitis with c/o dampened hearing and tinnitus with antihistamines, nasal steroids and patient was referred for ENT evaluation, but apparently appt was cancelled or never cosummated and she re-presents today with persistence of same sx's.  Denies vertigo, fever, head congestion.    Medication Sig  . atorvastatin  80 MG tablet TAKE ONE TABLET BY MOUTH AT BEDTIME FOR CHOLESTEROL  . VITAMIN D 1000 units tablet Take 1,000 Units by mouth daily.  . DULoxetine 60 MG capsule Take 1 capsule (60 mg total) by mouth daily.  . memantine-XR 28 MG Take 1 capsule (28 mg total) by mouth daily at 6 PM.  . metFORMIN -XR 500 MG 24 hr tablet TAKE 2 TABLETS BY MOUTH 2 TIMES A DAY AFTER MEALS  . predniSONE20 MG tablet 3 tabs po daily x 3 days, then 2 tabs x 3 days, then 1.5 tabs x 3 days, then 1 tab x 3 days, then 0.5 tabs x 3 days(Completing)   . traZODone (DESYREL) 50 MG tablet Take 50 mg by mouth at bedtime.    Allergies  Allergen Reactions  . Penicillins Rash    Swelling in face - last 10 years   Past Medical History  Diagnosis Date  . Diabetes mellitus   . Type II or unspecified type diabetes mellitus without mention of complication, not stated as uncontrolled   . Depression   . Hypertension   . Dementia   . Vitamin D deficiency   . Thyroid disease    Review of Systems  10 point systems review negative except as above.    Objective:   Physical Exam  BP 142/82   Pulse 72  Temp 97.5 F   Resp 16  Ht 5\' 4"    Wt 158 lb 9.6 oz     BMI 27.21   HEENT - Eac's patent. TM's 1-2 (+) retracted , ? R>L. EOM's full. PERRLA. NasoOroPharynx clear. Decreased hearing Rt.  Neck - supple. Nl Thyroid. Carotids 2+ & No bruits, nodes, JVD Chest - Clear equal BS w/o Rales, rhonchi, wheezes. Cor -  Nl HS. RRR w/o sig MGR. PP 1(+). No edema. MS- FROM w/o deformities. Muscle power, tone and bulk Nl. Gait Nl. Neuro - No obvious Cr N abnormalities. Sensory, motor and Cerebellar functions appear Nl w/o focal abnormalities.    Assessment & Plan:   1. Middle ear effusion, right  - pseudoephedrine (SUDAFED) 120 MG 12 hr tablet; Take 1 tablet 2 x / day for congestion & ear fluid  Dispense: 60 tablet; Refill: 0 - Ambulatory referral to ENT

## 2015-12-30 NOTE — Patient Instructions (Signed)
Serous Otitis Media  Serous otitis media is fluid in the middle ear space. This space contains the bones for hearing and air. Air in the middle ear space helps to transmit sound.  The air gets there through the eustachian tube. This tube goes from the back of the nose (nasopharynx) to the middle ear space. It keeps the pressure in the middle ear the same as the outside world. It also helps to drain fluid from the middle ear space. CAUSES  Serous otitis media occurs when the eustachian tube gets blocked. Blockage can come from:  Ear infections.  Colds and other upper respiratory infections.  Allergies.  Irritants such as cigarette smoke.  Sudden changes in air pressure (such as descending in an airplane).  Enlarged adenoids.  A mass in the nasopharynx. During colds and upper respiratory infections, the middle ear space can become temporarily filled with fluid. This can happen after an ear infection also. Once the infection clears, the fluid will generally drain out of the ear through the eustachian tube. If it does not, then serous otitis media occurs. SIGNS AND SYMPTOMS   Hearing loss.  A feeling of fullness in the ear, without pain.  Young children may not show any symptoms but may show slight behavioral changes, such as agitation, ear pulling, or crying. DIAGNOSIS  Serous otitis media is diagnosed by an ear exam. Tests may be done to check on the movement of the eardrum. Hearing exams may also be done. TREATMENT  The fluid most often goes away without treatment. If allergy is the cause, allergy treatment may be helpful. Fluid that persists for several months may require minor surgery. A small tube is placed in the eardrum to:  Drain the fluid.  Restore the air in the middle ear space. In certain situations, antibiotic medicines are used to avoid surgery. Surgery may be done to remove enlarged adenoids (if this is the cause). HOME CARE INSTRUCTIONS   Keep children away from  tobacco smoke.  Keep all follow-up visits as directed by your health care provider. SEEK MEDICAL CARE IF:   Your hearing is not better in 3 months.  Your hearing is worse.  You have ear pain.  You have drainage from the ear.  You have dizziness.  You have serous otitis media only in one ear or have any bleeding from your nose (epistaxis).  You notice a lump on your neck. MAKE SURE YOU:  Understand these instructions.   Will watch your condition.   Will get help right away if you are not doing well or get worse.

## 2016-01-04 DIAGNOSIS — H9311 Tinnitus, right ear: Secondary | ICD-10-CM | POA: Diagnosis not present

## 2016-01-04 DIAGNOSIS — H9313 Tinnitus, bilateral: Secondary | ICD-10-CM | POA: Diagnosis not present

## 2016-04-21 ENCOUNTER — Ambulatory Visit: Payer: Self-pay | Admitting: Internal Medicine

## 2016-06-08 ENCOUNTER — Ambulatory Visit (INDEPENDENT_AMBULATORY_CARE_PROVIDER_SITE_OTHER): Payer: Medicare Other | Admitting: Internal Medicine

## 2016-06-08 ENCOUNTER — Other Ambulatory Visit: Payer: Self-pay | Admitting: *Deleted

## 2016-06-08 VITALS — BP 130/80 | HR 76 | Temp 97.9°F | Resp 16 | Ht 64.0 in | Wt 160.4 lb

## 2016-06-08 DIAGNOSIS — N182 Chronic kidney disease, stage 2 (mild): Secondary | ICD-10-CM | POA: Diagnosis not present

## 2016-06-08 DIAGNOSIS — E782 Mixed hyperlipidemia: Secondary | ICD-10-CM

## 2016-06-08 DIAGNOSIS — I10 Essential (primary) hypertension: Secondary | ICD-10-CM | POA: Diagnosis not present

## 2016-06-08 DIAGNOSIS — E559 Vitamin D deficiency, unspecified: Secondary | ICD-10-CM | POA: Diagnosis not present

## 2016-06-08 DIAGNOSIS — Z1212 Encounter for screening for malignant neoplasm of rectum: Secondary | ICD-10-CM

## 2016-06-08 DIAGNOSIS — F32A Depression, unspecified: Secondary | ICD-10-CM

## 2016-06-08 DIAGNOSIS — Z0001 Encounter for general adult medical examination with abnormal findings: Secondary | ICD-10-CM

## 2016-06-08 DIAGNOSIS — Z136 Encounter for screening for cardiovascular disorders: Secondary | ICD-10-CM

## 2016-06-08 DIAGNOSIS — R6889 Other general symptoms and signs: Secondary | ICD-10-CM | POA: Diagnosis not present

## 2016-06-08 DIAGNOSIS — F329 Major depressive disorder, single episode, unspecified: Secondary | ICD-10-CM

## 2016-06-08 DIAGNOSIS — E1122 Type 2 diabetes mellitus with diabetic chronic kidney disease: Secondary | ICD-10-CM

## 2016-06-08 DIAGNOSIS — G301 Alzheimer's disease with late onset: Secondary | ICD-10-CM

## 2016-06-08 DIAGNOSIS — Z79899 Other long term (current) drug therapy: Secondary | ICD-10-CM

## 2016-06-08 DIAGNOSIS — F0280 Dementia in other diseases classified elsewhere without behavioral disturbance: Secondary | ICD-10-CM

## 2016-06-08 LAB — CBC WITH DIFFERENTIAL/PLATELET
BASOS ABS: 58 {cells}/uL (ref 0–200)
Basophils Relative: 1 %
EOS ABS: 174 {cells}/uL (ref 15–500)
Eosinophils Relative: 3 %
HEMATOCRIT: 41.2 % (ref 35.0–45.0)
Hemoglobin: 13.6 g/dL (ref 11.7–15.5)
LYMPHS PCT: 19 %
Lymphs Abs: 1102 cells/uL (ref 850–3900)
MCH: 30.1 pg (ref 27.0–33.0)
MCHC: 33 g/dL (ref 32.0–36.0)
MCV: 91.2 fL (ref 80.0–100.0)
MPV: 8.4 fL (ref 7.5–12.5)
Monocytes Absolute: 406 cells/uL (ref 200–950)
Monocytes Relative: 7 %
Neutro Abs: 4060 cells/uL (ref 1500–7800)
Neutrophils Relative %: 70 %
PLATELETS: 271 10*3/uL (ref 140–400)
RBC: 4.52 MIL/uL (ref 3.80–5.10)
RDW: 13.5 % (ref 11.0–15.0)
WBC: 5.8 10*3/uL (ref 3.8–10.8)

## 2016-06-08 LAB — TSH: TSH: 3.86 mIU/L

## 2016-06-08 MED ORDER — ATORVASTATIN CALCIUM 80 MG PO TABS: ORAL_TABLET | ORAL | 1 refills | Status: DC

## 2016-06-08 NOTE — Patient Instructions (Signed)

## 2016-06-08 NOTE — Progress Notes (Signed)
[facility] [name], M.D.    Uvaldo Bristle. Silverio Lay, P.A.-C      Starlyn Skeans, P.A.-C  [facility]                [address] [place], [place] [id number] Telephone [phone] Telefax [phone]  Annual Screening/Preventative Visit & Comprehensive Evaluation &  Examination     This very nice 65 y.o.  MWFpresents for a Screening/Preventative Visit & comprehensive evaluation and management of multiple medical co-morbidities.  Patient has been followed for HTN, T2_NIDDM  , Hyperlipidemia and Vitamin D Deficiency. Patient has been on SS Disability for Depression and Dementia since 2015 and is followed by Dr Toy [facility].       HTN predates circa 2010. Patient's BP has been controlled at home and patient denies any cardiac symptoms as chest pain, palpitations, shortness of breath, dizziness or ankle swelling. Today's BP is at goal -  130/80.      Patient's hyperlipidemia is not controlled with diet.  Last lipids were not at goal: Lab Results  Component Value Date   CHOL 188 11/18/2015   HDL 51 11/18/2015   LDLCALC 106 11/18/2015   TRIG 155 (H) 11/18/2015   CHOLHDL 3.7 11/18/2015      Patient has T2_NIDDM predating since 2000 and patient denies reactive hypoglycemic symptoms, visual blurring, diabetic polys, or paresthesias. She does not monitor CBG's regularly. Last A1c was  Lab Results  Component Value Date   HGBA1C 6.5 (H) 11/18/2015      Finally, patient has history of Vitamin D Deficiency in 2008 of "29" and she does not supplement as repeatedly recommended and last Vitamin D was still very low: Lab Results  Component Value Date   VD25OH 31 11/18/2015   Current Outpatient Prescriptions on File Prior to Visit  Medication Sig  . VITAMIN D 1000 units  Take 1,000 Units by mouth daily.  . DULoxetine 60 MG c Take 1 capsule (60 mg total) by mouth daily.  . memantine-XR 28 MG  cap Take 1 capsule (28 mg  total) by mouth daily at 6 PM.  . metFORMIN-XR 500 MG TAKE 2 TABLETS BY MOUTH 2 TIMES A DAY AFTER MEALS   Allergies  Allergen Reactions  . Penicillins Rash    Swelling in face - last 10 years   Past Medical History:  Diagnosis Date  . Dementia   . Depression   . Diabetes mellitus   . Hypertension   . Thyroid disease   . Type II or unspecified type diabetes mellitus without mention of complication, not stated as uncontrolled   . Vitamin D deficiency    Health Maintenance  Topic Date Due  . MAMMOGRAM  11/13/2001  . PAP SMEAR  04/18/2015  . INFLUENZA VACCINE  01/26/2016  . FOOT EXAM  05/04/2016  . URINE MICROALBUMIN  05/04/2016  . HEMOGLOBIN A1C  05/20/2016  . OPHTHALMOLOGY EXAM  07/29/2016  . COLONOSCOPY  04/26/2019  . TETANUS/TDAP  08/10/2025  . ZOSTAVAX  Completed  . Hepatitis C Screening  Completed  . HIV Screening  Completed   Immunization History  Administered Date(s) Administered  . DT 11/25/2005, 08/11/2015  . Pneumococcal Conjugate-13 08/04/2014  . Pneumococcal Polysaccharide-23 03/28/1999  . Zoster 04/28/2014   Past Surgical History:  Procedure Laterality Date  . FRACTURE SURGERY  2009   right arm; rod  . TOTAL KNEE ARTHROPLASTY  june 2013   left   Family History  Problem Relation Age of Onset  . Colon cancer Neg Hx   . Stomach cancer Neg Hx    Social History  Substance Use Topics  . Smoking status: Never Smoker  . Smokeless tobacco: Never Used  . Alcohol use No    ROS Constitutional: Denies fever, chills, weight loss/gain, headaches, insomnia,  night sweats, and change in appetite. Does c/o fatigue. Eyes: Denies redness, blurred vision, diplopia, discharge, itchy, watery eyes.  ENT: Denies discharge, congestion, post nasal drip, epistaxis, sore throat, earache, hearing loss, dental pain, Tinnitus, Vertigo, Sinus pain, snoring.  Cardio: Denies chest pain, palpitations, irregular heartbeat, syncope, dyspnea, diaphoresis, orthopnea, PND,  claudication, edema Respiratory: denies cough, dyspnea, DOE, pleurisy, hoarseness, laryngitis, wheezing.  Gastrointestinal: Denies dysphagia, heartburn, reflux, water brash, pain, cramps, nausea, vomiting, bloating, diarrhea, constipation, hematemesis, melena, hematochezia, jaundice, hemorrhoids Genitourinary: Denies dysuria, frequency, urgency, nocturia, hesitancy, discharge, hematuria, flank pain Breast: Breast lumps, nipple discharge, bleeding.  Musculoskeletal: Denies arthralgia, myalgia, stiffness, Jt. Swelling, pain, limp, and strain/sprain. Denies falls. Skin: Denies puritis, rash, hives, warts, acne, eczema, changing in skin lesion Neuro: No weakness, tremor, incoordination, spasms, paresthesia, pain Psychiatric: Denies confusion, memory loss, sensory loss. Denies Depression. Endocrine: Denies change in weight, skin, hair change, nocturia, and paresthesia, diabetic polys, visual blurring, hyper / hypo glycemic episodes.  Heme/Lymph: No excessive bleeding, bruising, enlarged lymph nodes.  Physical Exam  BP 130/80   Pulse 76   Temp 97.9 F (36.6 C)   Resp 16   Ht 5\' 4"  (1.626 m)   Wt 160 lb 6.4 oz (72.8 kg)   BMI 27.53 kg/m   General Appearance: Well nourished and in no apparent distress.  Eyes: PERRLA, EOMs, conjunctiva no swelling or erythema, normal fundi and vessels. Sinuses: No frontal/maxillary tenderness ENT/Mouth: EACs patent / TMs  nl. Nares clear without erythema, swelling, mucoid exudates. Oral hygiene is good. No erythema, swelling, or exudate. Tongue normal, non-obstructing. Tonsils not swollen or erythematous. Hearing normal.  Neck: Supple, thyroid normal. No bruits, nodes or JVD. Respiratory: Respiratory effort normal.  BS equal and clear bilateral without rales, rhonci, wheezing or stridor. Cardio: Heart sounds are normal with regular rate and rhythm and no murmurs, rubs or gallops. Peripheral pulses are normal and equal bilaterally without edema. No aortic or  femoral bruits. Chest: symmetric with normal excursions and percussion. Breasts: Symmetric, without lumps, nipple discharge, retractions, or fibrocystic changes.  Abdomen: Flat, soft with bowel sounds active. Nontender, no guarding, rebound, hernias, masses, or organomegaly.  Lymphatics: Non tender without lymphadenopathy.  Genitourinary:  Musculoskeletal: Full ROM all peripheral extremities, joint stability, 5/5 strength, and normal gait. Skin: Warm and dry without rashes, lesions, cyanosis, clubbing or  ecchymosis.  Neuro: Cranial nerves intact, reflexes equal bilaterally. Normal muscle tone, no cerebellar symptoms. Sensation intact to touch, Vibratory and Monofilament.  Pysch: Alert and oriented X 3, normal affect. Patient has poor insight into her personal health status and poor judgement.   Assessment and Plan  1. Annual Preventative Screening Examination  2. Essential hypertension  - Microalbumin / creatinine urine ratio - EKG 12-Lead - Urinalysis, Routine w reflex microscopic - CBC with Differential/Platelet - BASIC METABOLIC PANEL WITH GFR - TSH  3. Hyperlipidemia  - EKG 12-Lead - Hepatic function panel - Lipid panel - TSH  4. 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 Vitamin D deficiency  - VITAMIN  D 25 Hydroxy   6. SDAT    7. Depression, controlled   8. Screening for rectal cancer  - POC Hemoccult Bld/Stl   9. Screening for ischemic heart disease  - EKG 12-Lead  10. Medication management  - Urinalysis, Routine w reflex microscopic - CBC with Differential/Platelet - BASIC METABOLIC PANEL WITH GFR - Magnesium      Continue prudent diet as discussed, weight control, BP monitoring, regular exercise, and medications. Discussed med's effects and SE's. Screening labs and tests as requested with regular follow-up as recommended. Over 40 minutes of exam, counseling, chart  review and high complex critical decision making was performed.

## 2016-06-09 ENCOUNTER — Other Ambulatory Visit: Payer: Self-pay | Admitting: Internal Medicine

## 2016-06-09 LAB — URINALYSIS, MICROSCOPIC ONLY
Bacteria, UA: NONE SEEN [HPF]
CASTS: NONE SEEN [LPF]
RBC / HPF: NONE SEEN RBC/HPF (ref <=2)
YEAST: NONE SEEN [HPF]

## 2016-06-09 LAB — HEMOGLOBIN A1C
HEMOGLOBIN A1C: 6.1 % — AB (ref <5.7)
MEAN PLASMA GLUCOSE: 128 mg/dL

## 2016-06-09 LAB — BASIC METABOLIC PANEL WITH GFR
BUN: 13 mg/dL (ref 7–25)
CHLORIDE: 101 mmol/L (ref 98–110)
CO2: 23 mmol/L (ref 20–31)
CREATININE: 1 mg/dL — AB (ref 0.50–0.99)
Calcium: 9.4 mg/dL (ref 8.6–10.4)
GFR, Est African American: 69 mL/min (ref >=60)
GFR, Est Non African American: 60 mL/min (ref >=60)
GLUCOSE: 194 mg/dL — AB (ref 65–99)
Potassium: 3.8 mmol/L (ref 3.5–5.3)
Sodium: 139 mmol/L (ref 135–146)

## 2016-06-09 LAB — HEPATIC FUNCTION PANEL
ALBUMIN: 4.1 g/dL (ref 3.6–5.1)
ALT: 10 U/L (ref 6–29)
AST: 17 U/L (ref 10–35)
Alkaline Phosphatase: 66 U/L (ref 33–130)
BILIRUBIN TOTAL: 0.8 mg/dL (ref 0.2–1.2)
Bilirubin, Direct: 0.1 mg/dL (ref <=0.2)
Indirect Bilirubin: 0.7 mg/dL (ref 0.2–1.2)
TOTAL PROTEIN: 6.6 g/dL (ref 6.1–8.1)

## 2016-06-09 LAB — URINALYSIS, ROUTINE W REFLEX MICROSCOPIC
Bilirubin Urine: NEGATIVE
GLUCOSE, UA: NEGATIVE
HGB URINE DIPSTICK: NEGATIVE
KETONES UR: NEGATIVE
Nitrite: NEGATIVE
PH: 5.5 (ref 5.0–8.0)
Protein, ur: NEGATIVE
Specific Gravity, Urine: 1.019 (ref 1.001–1.035)

## 2016-06-09 LAB — LIPID PANEL
Cholesterol: 328 mg/dL — ABNORMAL HIGH (ref <200)
HDL: 48 mg/dL — AB (ref >50)
LDL CALC: 234 mg/dL — AB (ref <100)
Total CHOL/HDL Ratio: 6.8 Ratio — ABNORMAL HIGH (ref <5.0)
Triglycerides: 228 mg/dL — ABNORMAL HIGH (ref <150)
VLDL: 46 mg/dL — ABNORMAL HIGH (ref <30)

## 2016-06-09 LAB — MICROALBUMIN / CREATININE URINE RATIO
CREATININE, URINE: 148 mg/dL (ref 20–320)
MICROALB/CREAT RATIO: 3 ug/mg{creat} (ref <30)
Microalb, Ur: 0.4 mg/dL

## 2016-06-09 LAB — INSULIN, RANDOM: INSULIN: 24.3 u[IU]/mL — AB (ref 2.0–19.6)

## 2016-06-09 LAB — MAGNESIUM: MAGNESIUM: 1.5 mg/dL (ref 1.5–2.5)

## 2016-06-09 LAB — VITAMIN D 25 HYDROXY (VIT D DEFICIENCY, FRACTURES): VIT D 25 HYDROXY: 24 ng/mL — AB (ref 30–100)

## 2016-06-11 ENCOUNTER — Encounter: Payer: Self-pay | Admitting: Internal Medicine

## 2016-08-26 ENCOUNTER — Encounter: Payer: Self-pay | Admitting: Internal Medicine

## 2016-08-26 ENCOUNTER — Emergency Department (HOSPITAL_COMMUNITY): Payer: Medicare Other

## 2016-08-26 ENCOUNTER — Ambulatory Visit (INDEPENDENT_AMBULATORY_CARE_PROVIDER_SITE_OTHER): Payer: Medicare Other | Admitting: Internal Medicine

## 2016-08-26 ENCOUNTER — Encounter (HOSPITAL_COMMUNITY): Payer: Self-pay | Admitting: Emergency Medicine

## 2016-08-26 ENCOUNTER — Emergency Department (HOSPITAL_COMMUNITY)
Admission: EM | Admit: 2016-08-26 | Discharge: 2016-08-26 | Disposition: A | Discharge status: Home / Self Care | Payer: Medicare Other | Attending: Emergency Medicine | Admitting: Emergency Medicine

## 2016-08-26 VITALS — BP 128/78 | HR 76 | Temp 98.2°F | Resp 18 | Ht 64.0 in | Wt 162.0 lb

## 2016-08-26 DIAGNOSIS — S63501A Unspecified sprain of right wrist, initial encounter: Secondary | ICD-10-CM

## 2016-08-26 DIAGNOSIS — W2201XA Walked into wall, initial encounter: Secondary | ICD-10-CM | POA: Insufficient documentation

## 2016-08-26 DIAGNOSIS — Y939 Activity, unspecified: Secondary | ICD-10-CM | POA: Diagnosis not present

## 2016-08-26 DIAGNOSIS — N182 Chronic kidney disease, stage 2 (mild): Secondary | ICD-10-CM | POA: Diagnosis not present

## 2016-08-26 DIAGNOSIS — E039 Hypothyroidism, unspecified: Secondary | ICD-10-CM | POA: Insufficient documentation

## 2016-08-26 DIAGNOSIS — M25531 Pain in right wrist: Secondary | ICD-10-CM | POA: Diagnosis not present

## 2016-08-26 DIAGNOSIS — Y999 Unspecified external cause status: Secondary | ICD-10-CM | POA: Diagnosis not present

## 2016-08-26 DIAGNOSIS — Z7984 Long term (current) use of oral hypoglycemic drugs: Secondary | ICD-10-CM | POA: Insufficient documentation

## 2016-08-26 DIAGNOSIS — Z79899 Other long term (current) drug therapy: Secondary | ICD-10-CM | POA: Diagnosis not present

## 2016-08-26 DIAGNOSIS — I129 Hypertensive chronic kidney disease with stage 1 through stage 4 chronic kidney disease, or unspecified chronic kidney disease: Secondary | ICD-10-CM | POA: Diagnosis not present

## 2016-08-26 DIAGNOSIS — E1122 Type 2 diabetes mellitus with diabetic chronic kidney disease: Secondary | ICD-10-CM | POA: Insufficient documentation

## 2016-08-26 DIAGNOSIS — S6991XA Unspecified injury of right wrist, hand and finger(s), initial encounter: Secondary | ICD-10-CM | POA: Diagnosis present

## 2016-08-26 DIAGNOSIS — Y929 Unspecified place or not applicable: Secondary | ICD-10-CM | POA: Insufficient documentation

## 2016-08-26 MED ORDER — MELOXICAM 15 MG PO TABS: 15.0000 mg | ORAL_TABLET | Freq: Every day | ORAL | 0 refills | Status: DC

## 2016-08-26 NOTE — Patient Instructions (Signed)
Please start taking meloxicam once daily with food.  This will help with your inflammation and will help with some of the pain you are experiencing.  You can take 1,000 mg of tylenol 3 times per day as you need it to help with the pain until it kicks in.   Please wear a cockup wrist splint. You can buy this in the pharmacy.  If you have trouble finding it ask the pharmacist to help you.  Please take your hand out of the splint 2-3 times daily to do some exercises to increase the flexibility of the wrist and hand.  You can try either ice or heat on your wrist.  Which ever one feels better is fine to use.

## 2016-08-26 NOTE — Progress Notes (Signed)
Assessment and Plan:   1. Right wrist pain -likely OA vs. Wrist sprain but will rule out fracture with xray -meloxicam daily x 1 month -can use tylenol as needed to supplement for pain -cock up wrist splint.   - DG Wrist Complete Right; Future    HPI 66 y.o.female presents for right hand pain which started approximately 3 days ago.  She reports that she has no injury that she can think of.  She has been having some redness and swelling.  It is an aching and throbbing pain.  She reports that she has an 8/10 pain.  She reports that she has been taking tylenol.  She reports that it looks different from her other arm.  She is not having any tingling or numbness.  She has some radiation up her arm.     Past Medical History:  Diagnosis Date  . Dementia   . Depression   . Diabetes mellitus   . Hypertension   . Thyroid disease   . Type II or unspecified type diabetes mellitus without mention of complication, not stated as uncontrolled   . Vitamin D deficiency      Allergies  Allergen Reactions  . Penicillins Rash    Swelling in face - last 10 years      Current Outpatient Prescriptions on File Prior to Visit  Medication Sig Dispense Refill  . Blood Glucose Monitoring Suppl (ONETOUCH VERIO) W/DEVICE KIT 1 kit by Does not apply route as needed. Use to check glucose QD 1 kit 0  . cholecalciferol (VITAMIN D) 1000 units tablet Take 1,000 Units by mouth daily.    . diphenhydrAMINE (BENADRYL) 25 MG tablet Take 25 mg by mouth at bedtime as needed.    . DULoxetine (CYMBALTA) 60 MG capsule Take 1 capsule (60 mg total) by mouth daily. 90 capsule 3  . glucose blood (FREESTYLE LITE) test strip Test sugar once daily DX E11.29 50 each 4  . memantine (NAMENDA XR) 28 MG CP24 24 hr capsule Take 1 capsule (28 mg total) by mouth daily at 6 PM. 90 capsule 3  . metFORMIN (GLUCOPHAGE-XR) 500 MG 24 hr tablet TAKE 2 TABLETS BY MOUTH 2 TIMES A DAY AFTER MEALS 360 tablet 0  . ONE TOUCH LANCETS MISC Use as  directed to check glucose daily. 100 each 1   No current facility-administered medications on file prior to visit.     ROS: all negative except above.   Physical Exam: Filed Weights   08/26/16 1013  Weight: 162 lb (73.5 kg)   BP 128/78   Pulse 76   Temp 98.2 F (36.8 C) (Temporal)   Resp 18   Ht _0  (1.626 m)   Wt 162 lb (73.5 kg)   BMI 27.81 kg/m  General Appearance: Well developed well nourished, non-toxic appearing in no apparent distress. Eyes: PERRLA, EOMs, conjunctiva w/ no swelling or erythema or discharge Sinuses: No Frontal/maxillary tenderness ENT/Mouth: Ear canals clear without swelling or erythema.  TM's normal bilaterally with no retractions, bulging, or loss of landmarks.   Neck: Supple, thyroid normal, no notable JVD  Respiratory: Respiratory effort normal, Clear breath sounds anteriorly and posteriorly bilaterally without rales, rhonchi, wheezing or stridor. No retractions or accessory muscle usage. Cardio: RRR with no MRGs.   Abdomen: Soft, + BS.  Non tender, no guarding, rebound, hernias, masses.  Musculoskeletal: Right wrist with mild swelling at the styloid process.  Neurovascularly in tact.  Full active ROM of the wrist, fingers, and elbow.  Pain with palpation of the right lateral wrist.  No scapoid tenderness.  No tenderness at the hook of hamate.  No TFCC tenderness.  4/5 hand strength which is symmetric on the left. Full ROM, 5/5 strength, normal gait.  Skin: Warm, dry without rashes  Neuro: Awake and oriented X 3, Cranial nerves intact. Normal muscle tone, no cerebellar symptoms. Sensation intact.  Psych: normal affect, Insight and Judgment appropriate.     Starlyn Skeans, PA-C 10:19 AM [facility]

## 2016-08-26 NOTE — ED Provider Notes (Addendum)
[facility] DEPT Provider Note   CSN: [id number] Arrival date & time: 08/26/16  1418  By signing my name below, I, Sonum Patel, attest that this documentation has been prepared under the direction and in the presence of [facility], PA-C. Electronically Signed: Sonum Patel, Education administrator. 08/26/16. 3:45 PM.  History   Chief Complaint Chief Complaint  Patient presents with  . Wrist Injury    The history is provided by the patient. No language interpreter was used.    HPI Comments: Marie [name] is a 66 y.o. female who presents to the Emergency Department complaining of constant, unchanged right wrist pain that began 3 days ago after accidentally striking it on something. She is unsure where or how she injured the affected area. She currently rates her pain as a 2/10. She describes her pain as aching when she moves her wrist. She has tried ibuprofen with some relief. She denies numbness, weakness. She denies taking any anti-coagulants.   Past Medical History:  Diagnosis Date  . Dementia   . Depression   . Diabetes mellitus   . Hypertension   . Thyroid disease   . Type II or unspecified type diabetes mellitus without mention of complication, not stated as uncontrolled   . Vitamin D deficiency     Patient Active Problem List   Diagnosis Date Noted  . Body mass index (BMI) of 24.0-24.9 in adult 05/05/2015  . Generalized anxiety disorder 08/04/2014  . Essential hypertension 04/28/2014  . Medication management 04/28/2014  . Vitamin D deficiency 10/04/2013  . 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 Depression, controlled   . SDAT    . Hypothyroidism   . RESTLESS LEGS SYNDROME 08/03/2007  . Hyperlipidemia 07/28/2007  . Allergic rhinitis 07/28/2007    Past Surgical History:  Procedure Laterality Date  . FRACTURE SURGERY  2009   right arm; rod  . TOTAL KNEE ARTHROPLASTY  june 2013   left    OB History    No data available       Home Medications    Prior to  Admission medications   Medication Sig Start Date End Date Taking? Authorizing Provider  Blood Glucose Monitoring Suppl (ONETOUCH VERIO) W/DEVICE KIT 1 kit by Does not apply route as needed. Use to check glucose QD 12/30/13   [name], MD  cholecalciferol (VITAMIN D) 1000 units tablet Take 1,000 Units by mouth daily.    Historical Provider, MD  diphenhydrAMINE (BENADRYL) 25 MG tablet Take 25 mg by mouth at bedtime as needed.    Historical Provider, MD  DULoxetine (CYMBALTA) 60 MG capsule Take 1 capsule (60 mg total) by mouth daily. 10/22/14   [name], MD  glucose blood (FREESTYLE LITE) test strip Test sugar once daily DX E11.29 08/21/14   Vicie Mutters, PA-C  meloxicam (MOBIC) 15 MG tablet Take 1 tablet (15 mg total) by mouth daily. 08/26/16   Courtney Forcucci, PA-C  memantine (NAMENDA XR) 28 MG CP24 24 hr capsule Take 1 capsule (28 mg total) by mouth daily at 6 PM. 10/22/14   [name], MD  metFORMIN (GLUCOPHAGE-XR) 500 MG 24 hr tablet TAKE 2 TABLETS BY MOUTH 2 TIMES A DAY AFTER MEALS 08/15/15   [name], MD  ONE [facility] LANCETS MISC Use as directed to check glucose daily. 06/10/14   [name], MD    Family History Family History  Problem Relation Age of Onset  . Colon cancer Neg Hx   . Stomach cancer Neg Hx  Social History Social History  Substance Use Topics  . Smoking status: Never Smoker  . Smokeless tobacco: Never Used  . Alcohol use No     Allergies   Penicillins   Review of Systems Review of Systems  Musculoskeletal: Positive for arthralgias.  Skin: Negative for wound.  Neurological: Negative for weakness and numbness.     Physical Exam Updated Vital Signs BP 137/60 (BP Location: Right Arm)   Pulse 87   Temp 98.6 F (37 C) (Oral)   Resp 16   Wt 140 lb (63.5 kg)   SpO2 100%   BMI 24.03 kg/m   Physical Exam  Constitutional: She is oriented to person, place, and time. She appears well-developed and well-nourished. No distress.    HENT:  Head: Normocephalic and atraumatic.  Cardiovascular: Normal rate, regular rhythm and normal heart sounds.   No murmur heard. Pulmonary/Chest: Effort normal and breath sounds normal. No respiratory distress.  Musculoskeletal:  Right wrist with no gross deformity noted. No tenderness to palpation. Full ROM, however does endorse pain with flexion/extension. No joint effusion noted. No erythema or warmth overlaying the joint. The patient has normal sensation and motor function in the median, ulnar, and radial nerve distributions. There is no anatomic snuff box tenderness. 2+ radial pulse.   Neurological: She is alert and oriented to person, place, and time.  Skin: Skin is warm and dry.  Nursing note and vitals reviewed.    ED Treatments / Results  DIAGNOSTIC STUDIES: Oxygen Saturation is 100% on RA, normal by my interpretation.    COORDINATION OF CARE: 3:30 PM Discussed treatment plan with pt at bedside and pt agreed to plan.   Labs (all labs ordered are listed, but only abnormal results are displayed) Labs Reviewed - No data to display  EKG  EKG Interpretation None       Radiology Dg Wrist Complete Right  Result Date: 08/26/2016 CLINICAL DATA:  Right wrist pain following pulling injury 2 days ago, initial encounter EXAM: RIGHT WRIST - COMPLETE 3+ VIEW COMPARISON:  None. FINDINGS: There is no evidence of fracture or dislocation. There is no evidence of arthropathy or other focal bone abnormality. Soft tissues are unremarkable. IMPRESSION: No acute abnormality noted. Electronically Signed   By: Inez Catalina M.D.   On: 08/26/2016 15:33    Procedures Procedures (including critical care time)  SPLINT APPLICATION Authorized by: Ozella Almond Ward Consent: Verbal consent obtained. Risks and benefits: risks, benefits and alternatives were discussed Consent given by: patient Splint applied by: orthopedic technician Location details: Right wrist Splint type: Velco wrist  splint Post-procedure: The splinted body part was neurovascularly unchanged following the procedure. Patient tolerance: Patient tolerated the procedure well with no immediate complications.     Medications Ordered in ED Medications - No data to display   Initial Impression / Assessment and Plan / ED Course  I have reviewed the triage vital signs and the nursing notes.  Pertinent labs & imaging results that were available during my care of the patient were reviewed by me and considered in my medical decision making (see chart for details).    Marie [name] is a 66 y.o. female who presents to ED for right wrist pain. No tenderness to palpation on exam. Pain with wrist flexion/extension. No warmth, erythema or swelling.  Likely musk etiology. Patient X-Ray negative for obvious fracture or dislocation.  Pt advised to follow up with PCP if symptoms persist after trial of symptomatic therapy. Splint provided for comfort. All questions  answered.   Final Clinical Impressions(s) / ED Diagnoses   Final diagnoses:  None    New Prescriptions New Prescriptions   No medications on file   I personally performed the services described in this documentation, which was scribed in my presence. The recorded information has been reviewed and is accurate.    [name] Ward, PA-C 08/26/16 [place], MD 08/27/16 [place], PA-C 09/07/16 2122    Orlie Dakin, MD 09/07/16 [phone]

## 2016-08-26 NOTE — ED Triage Notes (Signed)
Pt complaint of continued right wrist pain post "knocking it on the wall" onset 3 days ago.

## 2016-08-26 NOTE — Discharge Instructions (Signed)
Your x-ray today was negative for injury. Use your splint throughout the day as needed for comfort. If symptoms persist more than 5-7 days, please see your primary care physician for recheck. Return to ER for new or worsening symptoms, any additional concerns.

## 2016-09-07 ENCOUNTER — Ambulatory Visit: Payer: Self-pay | Admitting: Internal Medicine

## 2016-09-12 ENCOUNTER — Ambulatory Visit (INDEPENDENT_AMBULATORY_CARE_PROVIDER_SITE_OTHER): Payer: Medicare Other | Admitting: Internal Medicine

## 2016-09-12 ENCOUNTER — Encounter: Payer: Self-pay | Admitting: Internal Medicine

## 2016-09-12 VITALS — BP 126/68 | HR 94 | Temp 97.8°F | Resp 16 | Ht 64.0 in | Wt 162.0 lb

## 2016-09-12 DIAGNOSIS — E039 Hypothyroidism, unspecified: Secondary | ICD-10-CM

## 2016-09-12 DIAGNOSIS — F411 Generalized anxiety disorder: Secondary | ICD-10-CM

## 2016-09-12 DIAGNOSIS — G2581 Restless legs syndrome: Secondary | ICD-10-CM

## 2016-09-12 DIAGNOSIS — E119 Type 2 diabetes mellitus without complications: Secondary | ICD-10-CM | POA: Diagnosis not present

## 2016-09-12 DIAGNOSIS — G301 Alzheimer's disease with late onset: Secondary | ICD-10-CM

## 2016-09-12 DIAGNOSIS — J3089 Other allergic rhinitis: Secondary | ICD-10-CM

## 2016-09-12 DIAGNOSIS — E782 Mixed hyperlipidemia: Secondary | ICD-10-CM | POA: Diagnosis not present

## 2016-09-12 DIAGNOSIS — I10 Essential (primary) hypertension: Secondary | ICD-10-CM

## 2016-09-12 DIAGNOSIS — Z0001 Encounter for general adult medical examination with abnormal findings: Secondary | ICD-10-CM

## 2016-09-12 DIAGNOSIS — Z79899 Other long term (current) drug therapy: Secondary | ICD-10-CM

## 2016-09-12 DIAGNOSIS — Z6824 Body mass index (BMI) 24.0-24.9, adult: Secondary | ICD-10-CM

## 2016-09-12 DIAGNOSIS — E1122 Type 2 diabetes mellitus with diabetic chronic kidney disease: Secondary | ICD-10-CM

## 2016-09-12 DIAGNOSIS — R6889 Other general symptoms and signs: Secondary | ICD-10-CM | POA: Diagnosis not present

## 2016-09-12 DIAGNOSIS — F329 Major depressive disorder, single episode, unspecified: Secondary | ICD-10-CM

## 2016-09-12 DIAGNOSIS — F32A Depression, unspecified: Secondary | ICD-10-CM

## 2016-09-12 DIAGNOSIS — F0280 Dementia in other diseases classified elsewhere without behavioral disturbance: Secondary | ICD-10-CM

## 2016-09-12 DIAGNOSIS — Z0000 Encounter for general adult medical examination without abnormal findings: Secondary | ICD-10-CM

## 2016-09-12 DIAGNOSIS — E559 Vitamin D deficiency, unspecified: Secondary | ICD-10-CM | POA: Diagnosis not present

## 2016-09-12 DIAGNOSIS — N182 Chronic kidney disease, stage 2 (mild): Secondary | ICD-10-CM

## 2016-09-12 LAB — HEPATIC FUNCTION PANEL
ALT: 18 U/L (ref 6–29)
AST: 19 U/L (ref 10–35)
Albumin: 4.3 g/dL (ref 3.6–5.1)
Alkaline Phosphatase: 79 U/L (ref 33–130)
Bilirubin, Direct: 0.2 mg/dL (ref <=0.2)
Indirect Bilirubin: 0.9 mg/dL (ref 0.2–1.2)
TOTAL PROTEIN: 6.7 g/dL (ref 6.1–8.1)
Total Bilirubin: 1.1 mg/dL (ref 0.2–1.2)

## 2016-09-12 LAB — CBC WITH DIFFERENTIAL/PLATELET
BASOS ABS: 79 {cells}/uL (ref 0–200)
Basophils Relative: 1 %
EOS PCT: 1 %
Eosinophils Absolute: 79 cells/uL (ref 15–500)
HCT: 41 % (ref 35.0–45.0)
HEMOGLOBIN: 13.9 g/dL (ref 11.7–15.5)
LYMPHS ABS: 1106 {cells}/uL (ref 850–3900)
Lymphocytes Relative: 14 %
MCH: 30.1 pg (ref 27.0–33.0)
MCHC: 33.9 g/dL (ref 32.0–36.0)
MCV: 88.7 fL (ref 80.0–100.0)
MPV: 8.7 fL (ref 7.5–12.5)
Monocytes Absolute: 632 cells/uL (ref 200–950)
Monocytes Relative: 8 %
NEUTROS PCT: 76 %
Neutro Abs: 6004 cells/uL (ref 1500–7800)
Platelets: 295 10*3/uL (ref 140–400)
RBC: 4.62 MIL/uL (ref 3.80–5.10)
RDW: 13.4 % (ref 11.0–15.0)
WBC: 7.9 10*3/uL (ref 3.8–10.8)

## 2016-09-12 LAB — LIPID PANEL
Cholesterol: 242 mg/dL — ABNORMAL HIGH (ref <200)
HDL: 48 mg/dL — ABNORMAL LOW (ref >50)
LDL CALC: 171 mg/dL — AB (ref <100)
TRIGLYCERIDES: 116 mg/dL (ref <150)
Total CHOL/HDL Ratio: 5 Ratio — ABNORMAL HIGH (ref <5.0)
VLDL: 23 mg/dL (ref <30)

## 2016-09-12 LAB — BASIC METABOLIC PANEL WITH GFR
BUN: 16 mg/dL (ref 7–25)
CALCIUM: 9.6 mg/dL (ref 8.6–10.4)
CO2: 25 mmol/L (ref 20–31)
CREATININE: 0.86 mg/dL (ref 0.50–0.99)
Chloride: 106 mmol/L (ref 98–110)
GFR, Est African American: 83 mL/min (ref >=60)
GFR, Est Non African American: 72 mL/min (ref >=60)
GLUCOSE: 163 mg/dL — AB (ref 65–99)
Potassium: 4.2 mmol/L (ref 3.5–5.3)
SODIUM: 139 mmol/L (ref 135–146)

## 2016-09-12 LAB — TSH: TSH: 2.37 m[IU]/L

## 2016-09-12 NOTE — Patient Instructions (Signed)
The wrist pain is likely being caused by osteoarthritis vs. A mild wrist sprain.  There is not a broken bone in the wrist.

## 2016-09-12 NOTE — Progress Notes (Signed)
MEDICARE ANNUAL WELLNESS VISIT AND FOLLOW UP Assessment:   1. Medication management  - CBC with Differential/Platelet - BASIC METABOLIC PANEL WITH GFR - Hepatic function panel  2. Mixed hyperlipidemia -needs statin -is on welchol samples - Lipid panel  3. Type 2 diabetes mellitus without complication, without long-term current use of insulin (HCC) -stop metformin -cont diet and exercise - Hemoglobin A1c  4. Essential hypertension -cont medications -dash diet -exercise as tolerated -monitor at home - TSH  5. Chronic non-seasonal allergic rhinitis, unspecified trigger -cont nasal spray -cont daily antihistamine -nasal [name] as tolerated  6. Hypothyroidism, unspecified type -cont levothyroxine -TSH -dose adjust if necessary  7. Type 2 diabetes mellitus with stage 2 chronic kidney disease, without long-term current use of insulin (HCC) -cont diet and exercise -cont monitoring blood sugar -may be able to stop metformin  8. SDAT  -cont namenda -followed by Dr. Toy Care  9. Depression, controlled -followed by Dr. Toy Care -on cymbalta  10. Generalized anxiety disorder -followed by Dr. Toy Care -cont cymbalta  11. RESTLESS LEGS SYNDROME -improved on cymbalta  12. Vitamin D deficiency -cont Vit D   13. Body mass index (BMI) of 24.0-24.9 in adult -cont diet and walking     Over 30 minutes of exam, counseling, chart review, and critical decision making was performed  Future Appointments Date Time Provider [place]  03/28/2017 9:30 AM [name], MD [facility] None  07/06/2017 2:00 PM [name], MD [facility] None     Plan:   During the course of the visit the patient was educated and counseled about appropriate screening and preventive services including:    Pneumococcal vaccine   Influenza vaccine  Prevnar 13  Td vaccine  Screening electrocardiogram  Colorectal cancer screening  Diabetes screening  Glaucoma  screening  Nutrition counseling    Subjective:  Marie [name] is a 66 y.o. female who presents for Medicare Annual Wellness Visit and 3 month follow up for HTN, hyperlipidemia, prediabetes, and vitamin D Def.   Her blood pressure has been controlled at home, today their BP is BP: 126/68 She does not workout. She denies chest pain, shortness of breath, dizziness.   She does have chest pain but it only happens when she picks up heavy stuff.    She is not on cholesterol medication and denies myalgias. Her cholesterol is not at goal. The cholesterol last visit was:   Lab Results  Component Value Date   CHOL 328 (H) 06/08/2016   HDL 48 (L) 06/08/2016   LDLCALC 234 (H) 06/08/2016   TRIG 228 (H) 06/08/2016   CHOLHDL 6.8 (H) 06/08/2016   She reports that she has been taking her metformin.  She reports that is not taking her blood sugars regularly.     Lab Results  Component Value Date   HGBA1C 6.1 (H) 06/08/2016   Last GFR Lab Results  Component Value Date   GFRNONAA 60 06/08/2016     Lab Results  Component Value Date   GFRAA 69 06/08/2016   Patient is on Vitamin D supplement.   Lab Results  Component Value Date   VD25OH 24 (L) 06/08/2016     She reports that her wrist is still bothersome to her.  She reports that she is not wearing her brace very often.  She reports that she is taking the meloxicam.    She reports that her granddaughter is still living with her.  She reports that she is doing well.    She is following with  Dr. Toy Care for her depression and anxiety.  She is taking her duloxetine.  She reports that this is so much more helpful than her other medications she had been on previously.     Medication Review: Current Outpatient Prescriptions on File Prior to Visit  Medication Sig Dispense Refill  . Blood Glucose Monitoring Suppl (ONETOUCH VERIO) W/DEVICE KIT 1 kit by Does not apply route as needed. Use to check glucose QD 1 kit 0  . cholecalciferol (VITAMIN D)  1000 units tablet Take 1,000 Units by mouth daily.    . diphenhydrAMINE (BENADRYL) 25 MG tablet Take 25 mg by mouth at bedtime as needed.    . DULoxetine (CYMBALTA) 60 MG capsule Take 1 capsule (60 mg total) by mouth daily. 90 capsule 3  . glucose blood (FREESTYLE LITE) test strip Test sugar once daily DX E11.29 50 each 4  . meloxicam (MOBIC) 15 MG tablet Take 1 tablet (15 mg total) by mouth daily. 30 tablet 0  . memantine (NAMENDA XR) 28 MG CP24 24 hr capsule Take 1 capsule (28 mg total) by mouth daily at 6 PM. 90 capsule 3  . metFORMIN (GLUCOPHAGE-XR) 500 MG 24 hr tablet TAKE 2 TABLETS BY MOUTH 2 TIMES A DAY AFTER MEALS 360 tablet 0  . ONE TOUCH LANCETS MISC Use as directed to check glucose daily. 100 each 1   No current facility-administered medications on file prior to visit.     Allergies: Allergies  Allergen Reactions  . Penicillins Rash    Swelling in face - last 10 years    Current Problems (verified) has Hyperlipidemia; RESTLESS LEGS SYNDROME; Allergic rhinitis; T2_NIDDM w/Stage 2 CKD (GFR 63 ml/min); Depression, controlled; SDAT ; Hypothyroidism; Vitamin D deficiency; Essential hypertension; Medication management; Generalized anxiety disorder; and Body mass index (BMI) of 24.0-24.9 in adult on her problem list.  Screening Tests Immunization History  Administered Date(s) Administered  . DT 11/25/2005, 08/11/2015  . Pneumococcal Conjugate-13 08/04/2014  . Pneumococcal Polysaccharide-23 03/28/1999  . Zoster 04/28/2014    Preventative care: Last colonoscopy: 2013 Mammogram: Due, has not been done since 2015 DEXA:  Has not been done.  Will establish at 28  Names of Other Physician/Practitioners you currently use: 1. [facility] here for primary care 2. Dr. Idolina Primer, eye doctor, last visit 2017, 8 months ago 3. Dr. Alinda Money, dentist, last visit 2017 Patient Care Team: [name], MD as PCP - General (Internal Medicine) Chucky May,  MD as Consulting Physician (Psychiatry)  Surgical: She  has a past surgical history that includes Total knee arthroplasty (june 2013) and Fracture surgery (2009). Family Her family history is not on file. Social history  She reports that she has never smoked. She has never used smokeless tobacco. She reports that she does not drink alcohol or use drugs.  MEDICARE WELLNESS OBJECTIVES: Physical activity: Current Exercise Habits: Home exercise routine, Type of exercise: walking, Time (Minutes): 30, Frequency (Times/Week): 5, Weekly Exercise (Minutes/Week): 150, Intensity: Moderate Cardiac risk factors: Cardiac Risk Factors include: advanced age (>63mn, >>69women);diabetes mellitus;dyslipidemia;hypertension;female gender Depression/mood screen:   [facility] 09/12/2016  Decreased Interest 2  Down, Depressed, Hopeless 2  PHQ - 2 Score 4  Altered sleeping 2  Tired, decreased energy 2  Change in appetite 2  Feeling bad or failure about yourself  2  Trouble concentrating 2  Moving slowly or fidgety/restless 2  Suicidal thoughts 2  PHQ-9 Score 18  Difficult doing work/chores Somewhat difficult    ADLs:  In your present state of health, do you have any difficulty performing the following activities: 09/12/2016 06/11/2016  Hearing? N N  Vision? N N  Difficulty concentrating or making decisions? Y N  Walking or climbing stairs? N N  Dressing or bathing? N N  Doing errands, shopping? N N  Preparing Food and eating ? N -  Using the Toilet? N -  In the past six months, have you accidently leaked urine? N -  Do you have problems with loss of bowel control? N -  Managing your Medications? N -  Managing your Finances? N -  Housekeeping or managing your Housekeeping? N -  Some recent data might be hidden     Cognitive Testing  Alert? Yes  Normal Appearance?Yes  Oriented to person? Yes  Place? Yes   Time? Yes  Recall of three objects?  Yes  Can perform simple calculations?  Yes  Displays appropriate judgment?Yes  Can read the correct time from a watch face?Yes  EOL planning: Does Patient Have a Medical Advance Directive?: No Would patient like information on creating a medical advance directive?: Yes (MAU/Ambulatory/Procedural Areas - Information given)   Objective:   Today's Vitals   09/12/16 1453  BP: 126/68  Pulse: 94  Resp: 16  Temp: 97.8 F (36.6 C)  TempSrc: Temporal  Weight: 162 lb (73.5 kg)  Height: 5' 4" (1.626 m)   Body mass index is 27.81 kg/m.  General appearance: alert, no distress, WD/WN, female HEENT: normocephalic, sclerae anicteric, TMs pearly, nares patent, no discharge or erythema, pharynx normal Oral cavity: MMM, no lesions Neck: supple, no lymphadenopathy, no thyromegaly, no masses Heart: RRR, normal S1, S2, no murmurs Lungs: CTA bilaterally, no wheezes, rhonchi, or rales Abdomen: +bs, soft, non tender, non distended, no masses, no hepatomegaly, no splenomegaly Musculoskeletal: nontender, no swelling, no obvious deformity Extremities: no edema, no cyanosis, no clubbing Pulses: 2+ symmetric, upper and lower extremities, normal cap refill Neurological: alert, oriented x 3, CN2-12 intact, strength normal upper extremities and lower extremities, sensation normal throughout, DTRs 2+ throughout, no cerebellar signs, gait normal Psychiatric: normal affect, behavior normal, pleasant   Medicare Attestation I have personally reviewed: The patient's medical and social history Their use of alcohol, tobacco or illicit drugs Their current medications and supplements The patient's functional ability including ADLs,fall risks, home safety risks, cognitive, and hearing and visual impairment Diet and physical activities Evidence for depression or mood disorders  The patient's weight, height, BMI, and visual acuity have been recorded in the chart.  I have made referrals, counseling, and provided education to the patient based on review of  the above and I have provided the patient with a written personalized care plan for preventive services.     Starlyn Skeans, PA-C   09/12/2016

## 2016-09-13 LAB — HEMOGLOBIN A1C
HEMOGLOBIN A1C: 6.4 % — AB (ref <5.7)
Mean Plasma Glucose: 137 mg/dL

## 2016-09-14 ENCOUNTER — Encounter: Payer: Self-pay | Admitting: *Deleted

## 2016-10-08 ENCOUNTER — Other Ambulatory Visit: Payer: Self-pay | Admitting: Internal Medicine

## 2016-11-29 ENCOUNTER — Other Ambulatory Visit: Payer: Self-pay | Admitting: Internal Medicine

## 2016-11-29 DIAGNOSIS — E782 Mixed hyperlipidemia: Secondary | ICD-10-CM

## 2016-12-19 ENCOUNTER — Ambulatory Visit: Payer: Self-pay | Admitting: Internal Medicine

## 2017-02-25 ENCOUNTER — Other Ambulatory Visit: Payer: Self-pay | Admitting: Internal Medicine

## 2017-02-25 DIAGNOSIS — E782 Mixed hyperlipidemia: Secondary | ICD-10-CM

## 2017-03-28 ENCOUNTER — Encounter: Payer: Self-pay | Admitting: Internal Medicine

## 2017-03-28 ENCOUNTER — Ambulatory Visit (INDEPENDENT_AMBULATORY_CARE_PROVIDER_SITE_OTHER): Payer: Medicare Other | Admitting: Internal Medicine

## 2017-03-28 ENCOUNTER — Other Ambulatory Visit: Payer: Self-pay | Admitting: *Deleted

## 2017-03-28 VITALS — BP 124/80 | HR 80 | Temp 97.3°F | Resp 18 | Ht 64.0 in | Wt 163.8 lb

## 2017-03-28 DIAGNOSIS — Z79899 Other long term (current) drug therapy: Secondary | ICD-10-CM

## 2017-03-28 DIAGNOSIS — E039 Hypothyroidism, unspecified: Secondary | ICD-10-CM | POA: Diagnosis not present

## 2017-03-28 DIAGNOSIS — E1122 Type 2 diabetes mellitus with diabetic chronic kidney disease: Secondary | ICD-10-CM | POA: Diagnosis not present

## 2017-03-28 DIAGNOSIS — N182 Chronic kidney disease, stage 2 (mild): Secondary | ICD-10-CM

## 2017-03-28 DIAGNOSIS — E782 Mixed hyperlipidemia: Secondary | ICD-10-CM

## 2017-03-28 DIAGNOSIS — Z23 Encounter for immunization: Secondary | ICD-10-CM

## 2017-03-28 DIAGNOSIS — E559 Vitamin D deficiency, unspecified: Secondary | ICD-10-CM

## 2017-03-28 DIAGNOSIS — I10 Essential (primary) hypertension: Secondary | ICD-10-CM

## 2017-03-28 MED ORDER — METFORMIN HCL ER 500 MG PO TB24: ORAL_TABLET | ORAL | 0 refills | Status: DC

## 2017-03-28 MED ORDER — ATORVASTATIN CALCIUM 80 MG PO TABS: ORAL_TABLET | ORAL | 0 refills | Status: DC

## 2017-03-28 NOTE — Patient Instructions (Signed)

## 2017-03-28 NOTE — Progress Notes (Signed)
This very nice 66 y.o. cognitively challenged MWF presents for 6 month follow up with Hypertension, Hyperlipidemia, T2_DMand Vitamin D Deficiency. Patient has been on SS Disability since 2015 for Depression & Dementia and is followed by Dr Toy Care.      Patient is treated for HTN (2010) & BP has been controlled at home. Today's BP is at goal -  124/80. Patient has had no complaints of any cardiac type chest pain, palpitations, dyspnea/orthopnea/PND, dizziness, claudication, or dependent edema.     Hyperlipidemia is not controlled with diet & patient is OFF OF HER ATORVASTATIN x 3 months for obscure reasons. Last Lipids were not at goal: Lab Results  Component Value Date   CHOL 242 (H) 09/12/2016   HDL 48 (L) 09/12/2016   LDLCALC 171 (H) 09/12/2016   TRIG 116 09/12/2016   CHOLHDL 5.0 (H) 09/12/2016      Also, the patient has history of T2_NIDDM (2000) and today relates that she has been off of her Metformin x 3 month for obscure reasons and has not been monitoring her glucoses. has had no symptoms of reactive hypoglycemia, diabetic polys, paresthesias or visual blurring.  Last A1c was not at goal: Lab Results  Component Value Date   HGBA1C 6.4 (H) 09/12/2016      Further, the patient also has history of Vitamin D Deficiency ("29" in 2008)  and supplements vitamin D without any suspected side-effects. Last vitamin D was not at goal: Lab Results  Component Value Date   VD25OH 24 (L) 06/08/2016   Current Outpatient Prescriptions on File Prior to Visit  Medication Sig  . VITAMIN D 1000 units tablet Take 1,000 Units by mouth daily.  . diphenhydrAMINE  25 MG  Take 25 mg by mouth at bedtime as needed.  . DULoxetine  60 MG  Take 1 capsule (60 mg total) by mouth daily.   Allergies  Allergen Reactions  . Penicillins Rash    Swelling in face - last 10 years   PMHx:   Past Medical History:  Diagnosis Date  . Dementia   . Depression   . Diabetes mellitus   . Hypertension   . Thyroid  disease   . Type II or unspecified type diabetes mellitus without mention of complication, not stated as uncontrolled   . Vitamin D deficiency    Immunization History  Administered Date(s) Administered  . DT 11/25/2005, 08/11/2015  . Pneumococcal Conjugate-13 08/04/2014  . Pneumococcal Polysaccharide-23 03/28/1999  . Zoster 04/28/2014   Past Surgical History:  Procedure Laterality Date  . FRACTURE SURGERY  2009   right arm; rod  . TOTAL KNEE ARTHROPLASTY  june 2013   left   FHx:    Reviewed / unchanged  SHx:    Reviewed / unchanged  Systems Review:  Constitutional: Denies fever, chills, wt changes, headaches, insomnia, fatigue, night sweats, change in appetite. Eyes: Denies redness, blurred vision, diplopia, discharge, itchy, watery eyes.  ENT: Denies discharge, congestion, post nasal drip, epistaxis, sore throat, earache, hearing loss, dental pain, tinnitus, vertigo, sinus pain, snoring.  CV: Denies chest pain, palpitations, irregular heartbeat, syncope, dyspnea, diaphoresis, orthopnea, PND, claudication or edema. Respiratory: denies cough, dyspnea, DOE, pleurisy, hoarseness, laryngitis, wheezing.  Gastrointestinal: Denies dysphagia, odynophagia, heartburn, reflux, water brash, abdominal pain or cramps, nausea, vomiting, bloating, diarrhea, constipation, hematemesis, melena, hematochezia  or hemorrhoids. Genitourinary: Denies dysuria, frequency, urgency, nocturia, hesitancy, discharge, hematuria or flank pain. Musculoskeletal: Denies arthralgias, myalgias, stiffness, jt. swelling, pain, limping or strain/sprain.  Skin:  Denies pruritus, rash, hives, warts, acne, eczema or change in skin lesion(s). Neuro: No weakness, tremor, incoordination, spasms, paresthesia or pain. Psychiatric: Denies confusion, memory loss or sensory loss. Endo: Denies change in weight, skin or hair change.  Heme/Lymph: No excessive bleeding, bruising or enlarged lymph nodes.  Physical Exam  BP 124/80    Pulse 80   Temp (!) 97.3 F (36.3 C)   Resp 18   Ht 5\' 4"  (1.626 m)   Wt 163 lb 12.8 oz (74.3 kg)   BMI 28.12 kg/m   Appears well nourished, well groomed  and in no distress.  Eyes: PERRLA, EOMs, conjunctiva no swelling or erythema. Sinuses: No frontal/maxillary tenderness ENT/Mouth: EAC's clear, TM's nl w/o erythema, bulging. Nares clear w/o erythema, swelling, exudates. Oropharynx clear without erythema or exudates. Oral hygiene is good. Tongue normal, non obstructing. Hearing intact.  Neck: Supple. Thyroid nl. Car 2+/2+ without bruits, nodes or JVD. Chest: Respirations nl with BS clear & equal w/o rales, rhonchi, wheezing or stridor.  Cor: Heart sounds normal w/ regular rate and rhythm without sig. murmurs, gallops, clicks or rubs. Peripheral pulses normal and equal  without edema.  Abdomen: Soft & bowel sounds normal. Non-tender w/o guarding, rebound, hernias, masses or organomegaly.  Lymphatics: Unremarkable.  Musculoskeletal: Full ROM all peripheral extremities, joint stability, 5/5 strength and normal gait.  Skin: Warm, dry without exposed rashes, lesions or ecchymosis apparent.  Neuro: Cranial nerves intact, reflexes equal bilaterally. Sensory-motor testing grossly intact. Tendon reflexes grossly intact.  Pysch: Alert & oriented x 3.  Insight and judgement nl & appropriate. No ideations.  Assessment and Plan:  1. Essential hypertension  - Continue medication, monitor blood pressure at home.  - Continue DASH diet. Reminder to go to the ER if any CP,  SOB, nausea, dizziness, severe HA, changes vision/speech.  - CBC with Differential/Platelet - BASIC METABOLIC PANEL WITH GFR - Magnesium - TSH  2. Hyperlipidemia, mixed  - Continue diet/meds, exercise,& lifestyle modifications.  - Continue monitor periodic cholesterol/liver & renal functions   - Hepatic function panel - Lipid panel - TSH  3. Type 2 diabetes mellitus with stage 2 chronic kidney disease, without  long-term current use of insulin (HCC)  - Continue diet, exercise, lifestyle modifications.  - Monitor appropriate labs.  - Hemoglobin A1c - Insulin, random  4. Vitamin D deficiency  - Continue supplementation.  - VITAMIN D 25 Hydroxy  5. Hypothyroidism  - TSH  6. Medication management  - CBC with Differential/Platelet - BASIC METABOLIC PANEL WITH GFR - Hepatic function panel - Magnesium - Lipid panel - TSH - Hemoglobin A1c - Insulin, random - VITAMIN D 25 Hydroxy        Discussed  regular exercise, BP monitoring, weight control to achieve/maintain BMI less than 25 and discussed med and SE's. Recommended labs to assess and monitor clinical status with further disposition pending results of labs. Over 30 minutes of exam, counseling, chart review was performed.

## 2017-03-29 LAB — LIPID PANEL
CHOL/HDL RATIO: 7.1 (calc) — AB (ref <5.0)
CHOLESTEROL: 319 mg/dL — AB (ref <200)
HDL: 45 mg/dL — ABNORMAL LOW (ref >50)
LDL CHOLESTEROL (CALC): 234 mg/dL — AB
NON-HDL CHOLESTEROL (CALC): 274 mg/dL — AB (ref <130)
Triglycerides: 211 mg/dL — ABNORMAL HIGH (ref <150)

## 2017-03-29 LAB — HEMOGLOBIN A1C
EAG (MMOL/L): 7.7 (calc)
Hgb A1c MFr Bld: 6.5 % of total Hgb — ABNORMAL HIGH (ref <5.7)
Mean Plasma Glucose: 140 (calc)

## 2017-03-29 LAB — BASIC METABOLIC PANEL WITH GFR
BUN / CREAT RATIO: 9 (calc) (ref 6–22)
BUN: 10 mg/dL (ref 7–25)
CHLORIDE: 105 mmol/L (ref 98–110)
CO2: 27 mmol/L (ref 20–32)
Calcium: 9 mg/dL (ref 8.6–10.4)
Creat: 1.09 mg/dL — ABNORMAL HIGH (ref 0.50–0.99)
GFR, Est African American: 62 mL/min/{1.73_m2} (ref >=60)
GFR, Est Non African American: 53 mL/min/{1.73_m2} — ABNORMAL LOW (ref >=60)
GLUCOSE: 161 mg/dL — AB (ref 65–99)
POTASSIUM: 3.8 mmol/L (ref 3.5–5.3)
SODIUM: 141 mmol/L (ref 135–146)

## 2017-03-29 LAB — CBC WITH DIFFERENTIAL/PLATELET
BASOS PCT: 1.1 %
Basophils Absolute: 78 cells/uL (ref 0–200)
EOS ABS: 206 {cells}/uL (ref 15–500)
Eosinophils Relative: 2.9 %
HCT: 38.6 % (ref 35.0–45.0)
Hemoglobin: 13.4 g/dL (ref 11.7–15.5)
Lymphs Abs: 1434 cells/uL (ref 850–3900)
MCH: 31 pg (ref 27.0–33.0)
MCHC: 34.7 g/dL (ref 32.0–36.0)
MCV: 89.4 fL (ref 80.0–100.0)
MONOS PCT: 8.4 %
MPV: 9 fL (ref 7.5–12.5)
Neutro Abs: 4785 cells/uL (ref 1500–7800)
Neutrophils Relative %: 67.4 %
PLATELETS: 311 10*3/uL (ref 140–400)
RBC: 4.32 10*6/uL (ref 3.80–5.10)
RDW: 12.4 % (ref 11.0–15.0)
TOTAL LYMPHOCYTE: 20.2 %
WBC mixed population: 596 cells/uL (ref 200–950)
WBC: 7.1 10*3/uL (ref 3.8–10.8)

## 2017-03-29 LAB — MAGNESIUM: MAGNESIUM: 1.8 mg/dL (ref 1.5–2.5)

## 2017-03-29 LAB — HEPATIC FUNCTION PANEL
AG Ratio: 1.7 (calc) (ref 1.0–2.5)
ALKALINE PHOSPHATASE (APISO): 79 U/L (ref 33–130)
ALT: 11 U/L (ref 6–29)
AST: 15 U/L (ref 10–35)
Albumin: 3.9 g/dL (ref 3.6–5.1)
BILIRUBIN INDIRECT: 0.4 mg/dL (ref 0.2–1.2)
Bilirubin, Direct: 0.1 mg/dL (ref 0.0–0.2)
Globulin: 2.3 g/dL (calc) (ref 1.9–3.7)
TOTAL PROTEIN: 6.2 g/dL (ref 6.1–8.1)
Total Bilirubin: 0.5 mg/dL (ref 0.2–1.2)

## 2017-03-29 LAB — INSULIN, RANDOM: INSULIN: 4.4 u[IU]/mL (ref 2.0–19.6)

## 2017-03-29 LAB — TSH: TSH: 4.3 m[IU]/L (ref 0.40–4.50)

## 2017-03-29 LAB — VITAMIN D 25 HYDROXY (VIT D DEFICIENCY, FRACTURES): Vit D, 25-Hydroxy: 17 ng/mL — ABNORMAL LOW (ref 30–100)

## 2017-04-06 ENCOUNTER — Telehealth: Payer: Self-pay | Admitting: *Deleted

## 2017-04-06 NOTE — Telephone Encounter (Signed)
Patient called and reported the Metformin is causing her to have diarrhea. Per Dr Melford Aase, the patient should try taking Imodium 2 to 3 tablets prior to taking the Metformin.  A message was left to inform the patient.

## 2017-05-25 ENCOUNTER — Other Ambulatory Visit: Payer: Self-pay | Admitting: *Deleted

## 2017-05-25 MED ORDER — ONETOUCH LANCETS MISC: 1 refills | Status: DC

## 2017-05-25 MED ORDER — ONETOUCH VERIO W/DEVICE KIT: 1.0000 | PACK | 0 refills | Status: DC | PRN

## 2017-05-25 MED ORDER — GLUCOSE BLOOD VI STRP: ORAL_STRIP | 4 refills | Status: DC

## 2017-05-25 MED ORDER — GLUCOSE BLOOD VI STRP: ORAL_STRIP | 12 refills | Status: DC

## 2017-06-06 ENCOUNTER — Encounter: Payer: Self-pay | Admitting: Internal Medicine

## 2017-06-06 ENCOUNTER — Ambulatory Visit (INDEPENDENT_AMBULATORY_CARE_PROVIDER_SITE_OTHER): Payer: Medicare Other | Admitting: Internal Medicine

## 2017-06-06 VITALS — BP 116/74 | HR 76 | Temp 97.6°F | Resp 16 | Ht 64.0 in | Wt 145.6 lb

## 2017-06-06 DIAGNOSIS — R112 Nausea with vomiting, unspecified: Secondary | ICD-10-CM

## 2017-06-06 DIAGNOSIS — R109 Unspecified abdominal pain: Secondary | ICD-10-CM

## 2017-06-06 LAB — HEPATIC FUNCTION PANEL
AG RATIO: 1.7 (calc) (ref 1.0–2.5)
ALKALINE PHOSPHATASE (APISO): 77 U/L (ref 33–130)
ALT: 8 U/L (ref 6–29)
AST: 16 U/L (ref 10–35)
Albumin: 4.6 g/dL (ref 3.6–5.1)
BILIRUBIN DIRECT: 0.3 mg/dL — AB (ref 0.0–0.2)
BILIRUBIN INDIRECT: 1.2 mg/dL (ref 0.2–1.2)
BILIRUBIN TOTAL: 1.5 mg/dL — AB (ref 0.2–1.2)
Globulin: 2.7 g/dL (calc) (ref 1.9–3.7)
Total Protein: 7.3 g/dL (ref 6.1–8.1)

## 2017-06-06 LAB — CBC WITH DIFFERENTIAL/PLATELET
BASOS ABS: 49 {cells}/uL (ref 0–200)
Basophils Relative: 0.5 %
EOS ABS: 59 {cells}/uL (ref 15–500)
Eosinophils Relative: 0.6 %
HEMATOCRIT: 42.6 % (ref 35.0–45.0)
HEMOGLOBIN: 14.7 g/dL (ref 11.7–15.5)
LYMPHS ABS: 1245 {cells}/uL (ref 850–3900)
MCH: 30 pg (ref 27.0–33.0)
MCHC: 34.5 g/dL (ref 32.0–36.0)
MCV: 86.9 fL (ref 80.0–100.0)
MONOS PCT: 8.2 %
MPV: 9.6 fL (ref 7.5–12.5)
NEUTROS ABS: 7644 {cells}/uL (ref 1500–7800)
Neutrophils Relative %: 78 %
Platelets: 357 10*3/uL (ref 140–400)
RBC: 4.9 10*6/uL (ref 3.80–5.10)
RDW: 12.5 % (ref 11.0–15.0)
Total Lymphocyte: 12.7 %
WBC mixed population: 804 cells/uL (ref 200–950)
WBC: 9.8 10*3/uL (ref 3.8–10.8)

## 2017-06-06 LAB — BASIC METABOLIC PANEL WITH GFR
BUN/Creatinine Ratio: 8 (calc) (ref 6–22)
BUN: 16 mg/dL (ref 7–25)
CO2: 27 mmol/L (ref 20–32)
Calcium: 10.2 mg/dL (ref 8.6–10.4)
Chloride: 99 mmol/L (ref 98–110)
Creat: 2.03 mg/dL — ABNORMAL HIGH (ref 0.50–0.99)
GFR, EST AFRICAN AMERICAN: 29 mL/min/{1.73_m2} — AB (ref >=60)
GFR, EST NON AFRICAN AMERICAN: 25 mL/min/{1.73_m2} — AB (ref >=60)
Glucose, Bld: 149 mg/dL — ABNORMAL HIGH (ref 65–99)
POTASSIUM: 3.7 mmol/L (ref 3.5–5.3)
SODIUM: 140 mmol/L (ref 135–146)

## 2017-06-06 LAB — AMYLASE: Amylase: 122 U/L — ABNORMAL HIGH (ref 21–101)

## 2017-06-06 LAB — LIPASE: Lipase: 44 U/L (ref 7–60)

## 2017-06-06 MED ORDER — RANITIDINE HCL 300 MG PO TABS: ORAL_TABLET | ORAL | 2 refills | Status: DC

## 2017-06-06 MED ORDER — ONDANSETRON HCL 8 MG PO TABS: ORAL_TABLET | ORAL | 2 refills | Status: DC

## 2017-06-06 NOTE — Patient Instructions (Addendum)
Nausea and Vomiting, Adult Nausea is the feeling that you have an upset stomach or have to vomit. As nausea gets worse, it can lead to vomiting. Vomiting occurs when stomach contents are thrown up and out of the mouth. Vomiting can make you feel weak and cause you to become dehydrated. Dehydration can make you tired and thirsty, cause you to have a dry mouth, and decrease how often you urinate. Older adults and people with other diseases or a weak immune system are at higher risk for dehydration. It is important to treat your nausea and vomiting as told by your health care provider. Follow these instructions at home: Follow instructions from your health care provider about how to care for yourself at home. Eating and drinking Follow these recommendations as told by your health care provider:  Take an oral rehydration solution (ORS). This is a drink that is sold at pharmacies and retail stores.  Drink clear fluids in small amounts as you are able. Clear fluids include water, ice chips, diluted fruit juice, and low-calorie sports drinks.  Eat bland, easy-to-digest foods in small amounts as you are able. These foods include bananas, applesauce, rice, lean meats, toast, and crackers.  Avoid fluids that contain a lot of sugar or caffeine, such as energy drinks, sports drinks, and soda.  Avoid alcohol.  Avoid spicy or fatty foods.  General instructions  Drink enough fluid to keep your urine clear or pale yellow.  Wash your hands often. If soap and water are not available, use hand sanitizer.  Make sure that all people in your household wash their hands well and often.  Take over-the-counter and prescription medicines only as told by your health care provider.  Rest at home while you recover.  Watch your condition for any changes.  Breathe slowly and deeply when you feel nauseated.  Keep all follow-up visits as told by your health care provider. This is important. Contact a health care  provider if:  You have a fever.  You cannot keep fluids down.  Your symptoms get worse.  You have new symptoms.  Your nausea does not go away after two days.  You feel light-headed or dizzy.  You have a headache.  You have muscle cramps. Get help right away if:  You have pain in your chest, neck, arm, or jaw.  You feel extremely weak or you faint.  You have persistent vomiting.  You see blood in your vomit.  Your vomit looks like black coffee grounds.  You have bloody or black stools or stools that look like tar.  You have a severe headache, a stiff neck, or both.  You have a rash.  You have severe pain, cramping, or bloating in your abdomen.  You have trouble breathing or you are breathing very quickly.  Your heart is beating very quickly.  Your skin feels cold and clammy.  You feel confused.  You have pain when you urinate.  You have signs of dehydration, such as: ? Dark urine, very little urine, or no urine. ? Cracked lips. ? Dry mouth. ? Sunken eyes. ? Sleepiness. ? Weakness. These symptoms may represent a serious problem that is an emergency. Do not wait to see if the symptoms will go away. Get medical help right away. Call your local emergency services (911 in the [place] Do not drive yourself to the hospital.

## 2017-06-06 NOTE — Progress Notes (Signed)
  Subjective:    Patient ID: Marie [name], female    DOB: April 26, 1951, 66 y.o.   MRN: [id number]  HPI  Patient presents with 3-4 days hx/o N/V which she attributes to her daughter & 71 yo grandson moving back in with her & husband. She denies any discrete localized pains.  Glucoses usu betw 110-180 mg%.  Medication Sig  . atorvastatin  80 MG tablet TAKE ONE TABLET BY MOUTH AT BEDTIME FOR CHOLESTEROL  . VITAMIN D 1000 units tablet Take 1,000 Units by mouth daily.  . DULoxetine  60 MG capsule Take 1 capsule (60 mg total) by mouth daily.  . metFORMIN -XR 500 MG  TAKE 2 TABLETS BY MOUTH 2 TIMES A DAY AFTER MEALS  . BENADRYL 25 MG tablet Take 25 mg by mouth at bedtime as needed.   Allergies  Allergen Reactions  . Penicillins Rash    Swelling in face - last 10 years    Past Medical History:  Diagnosis Date  . Dementia   . Depression   . Diabetes mellitus   . Hypertension   . Thyroid disease   . Type II or unspecified type diabetes mellitus without mention of complication, not stated as uncontrolled   . Vitamin D deficiency    Review of Systems  10 point systems review negative except as above.    Objective:   Physical Exam  BP 116/74   Pulse 76   Temp 97.6 F (36.4 C)   Resp 16   Ht 5\' 4"  (1.626 m)   Wt 145 lb 9.6 oz (66 kg)   BMI 24.99 kg/m   Skin - Nl turgor w/o w/o rash, icterus or cyanosis.  HEENT - WNL. Neck - supple.  Chest - Clear equal BS. Cor - Nl HS. RRR w/o sig MGR.  No edema. Abd - Soft . No point tenderness o guarding.  MS- FROM w/o deformities.  Gait Nl. Neuro -  Nl w/o focal abnormalities.    Assessment & Plan:   1. Nausea and vomiting, intractability of vomiting not specified, unspecified vomiting type  - Amylase - ondansetron (ZOFRAN) 8 MG tablet; Take 1/2 to 1 tablet 2 to 3 x / day if needed for nausea / vomiting  Dispense: 30 tablet; Refill: 2 - CBC with Differential/Platelet - Hepatic function panel - BASIC METABOLIC PANEL WITH GFR -  Lipase  2. Abdominal pain, unspecified abdominal location  - ranitidine (ZANTAC) 300 MG tablet; Take 1 tablet 2 x / day for indigestion  Dispense: 60 tablet; Refill: 2 - CBC with Differential/Platelet - Hepatic function panel - BASIC METABOLIC PANEL WITH GFR - Lipase  - f/u pending labs.

## 2017-06-12 NOTE — Progress Notes (Signed)
Assessment and Plan:  Diagnoses and all orders for this visit:  Acute kidney injury superimposed on chronic kidney disease ([place]) Has not been drinking as recommended; likely related to dementia reportedly ongoing after surgical procedure - discussed with husband he needs to set out 5-6 bottles of water for her daily to go through Unable to provide urine specimen today - will bring back tomorrow Pending renal functions may refer to nephrology -     BASIC METABOLIC PANEL WITH GFR -     Urinalysis w microscopic + reflex cultur  Type 2 diabetes mellitus with stage 2 chronic kidney disease, without long-term current use of insulin ([place]) Stop taking metformin until kidney functions recover Check blood sugar BID - goals given; to call office if consistently running above goals - will follow up as scheduled in 2-3 weeks to discuss medications -     glucose blood test strip; CHECK BLOOD SUGAR 1 TIME DAILY-DX e11.22 -     ONE TOUCH LANCETS MISC; Use as directed to check glucose daily. [id number]  Further disposition pending results of labs. Discussed med's effects and SE's.   Over 20 minutes of exam, counseling, chart review, and critical decision making was performed.   Future Appointments  Date Time Provider [place]  07/06/2017  2:00 PM [name], MD [facility] None    ------------------------------------------------------------------------------------------------------------------   HPI BP 122/82   Pulse 97   Temp (!) 97.5 F (36.4 C)   Ht _0  (1.626 m)   Wt 141 lb (64 kg)   SpO2 97%   BMI 24.20 kg/m   66 y.o.female presents accompanied by her husband; she has hx of SDAT and T2DM presents for 1 week follow up for N/V without abdominal pain for which she was evaluated at this office. Labs obtained that day by Dr. Melford Aase were unremarkable except for an acute decline in renal function. She was prescribed zofran for nausea and advised to increase fluid intake to 6  bottles of water (100+ fluid ounces) of water daily.   She reports she has improved nausea and has not had any episodes of emesis; she reports she is beginning to regain her appetite. When inquired about constipation, the patient reveals BM have been quite irregular over the past month and sometimes goes several days without. She reports she had been off of metformin but was restarted 1 month ago - 2 tabs BID- had had severe diarrhea; she was advised to take imodium to control this symptom, and has been taking OTC imodium with each dose ever since. She has continued to take metformin and imodium over the past week.   Lab Results  Component Value Date   GFRNONAA 25 (L) 06/06/2017   GFRNONAA 53 (L) 03/28/2017   GFRNONAA 72 09/12/2016   BMP Latest Ref Rng & Units 06/06/2017 03/28/2017 09/12/2016  Glucose 65 - 99 mg/dL 149(H) 161(H) 163(H)  BUN 7 - 25 mg/dL _1 Creatinine 0.50 - 0.99 mg/dL 2.03(H) 1.09(H) 0.86  BUN/Creat Ratio 6 - 22 (calc) 8 9 -  Sodium 135 - 146 mmol/L 140 141 139  Potassium 3.5 - 5.3 mmol/L 3.7 3.8 4.2  Chloride 98 - 110 mmol/L 99 105 106  CO2 20 - 32 mmol/L _2 Calcium 8.6 - 10.4 mg/dL 10.2 9.0 9.6      Past Medical History:  Diagnosis Date  . Dementia   . Depression   . Diabetes mellitus   . Hypertension   . Thyroid disease   .  Type II or unspecified type diabetes mellitus without mention of complication, not stated as uncontrolled   . Vitamin D deficiency      Allergies  Allergen Reactions  . Penicillins Rash    Swelling in face - last 10 years    Current Outpatient Medications on File Prior to Visit  Medication Sig  . atorvastatin (LIPITOR) 80 MG tablet TAKE ONE TABLET BY MOUTH AT BEDTIME FOR CHOLESTEROL  . Blood Glucose Monitoring Suppl (ONETOUCH VERIO) w/Device KIT 1 kit by Does not apply route as needed. Use to check glucose QD DX-E11.22  . cholecalciferol (VITAMIN D) 1000 units tablet Take 1,000 Units by mouth daily.  . DULoxetine  (CYMBALTA) 60 MG capsule Take 1 capsule (60 mg total) by mouth daily.  . metFORMIN (GLUCOPHAGE-XR) 500 MG 24 hr tablet TAKE 2 TABLETS BY MOUTH 2 TIMES A DAY AFTER MEALS  . ondansetron (ZOFRAN) 8 MG tablet Take 1/2 to 1 tablet 2 to 3 x / day if needed for nausea / vomiting  . ranitidine (ZANTAC) 300 MG tablet Take 1 tablet 2 x / day for indigestion   No current facility-administered medications on file prior to visit.     ROS: all negative except above.   Physical Exam:  BP 122/82   Pulse 97   Temp (!) 97.5 F (36.4 C)   Ht _0  (1.626 m)   Wt 141 lb (64 kg)   SpO2 97%   BMI 24.20 kg/m   General Appearance: Well nourished, in no apparent distress. Eyes: PERRLA, EOMs, conjunctiva no swelling or erythema Sinuses: No Frontal/maxillary tenderness ENT/Mouth: Ext aud canals clear, TMs without erythema, bulging. No erythema, swelling, or exudate on post pharynx.  Tonsils not swollen or erythematous. Hearing normal.  Neck: Supple, thyroid normal.  Respiratory: Respiratory effort normal, BS equal bilaterally without rales, rhonchi, wheezing or stridor.  Cardio: RRR with no MRGs. Brisk peripheral pulses without edema.  Abdomen: Soft, + BS.  Non tender, no guarding, rebound, hernias, masses. Lymphatics: Non tender without lymphadenopathy.  Musculoskeletal: Full ROM, 5/5 strength, narrow based gait.  Skin: Warm, dry without rashes, lesions, ecchymosis.   Psych: Awake and oriented X 3, normal affect, Insight and Judgment appropriate.     Izora Ribas, NP 4:08 PM [facility]

## 2017-06-13 ENCOUNTER — Encounter: Payer: Self-pay | Admitting: Adult Health

## 2017-06-13 ENCOUNTER — Ambulatory Visit: Payer: Medicare Other | Admitting: Adult Health

## 2017-06-13 VITALS — BP 122/82 | HR 97 | Temp 97.5°F | Ht 64.0 in | Wt 141.0 lb

## 2017-06-13 DIAGNOSIS — Z79899 Other long term (current) drug therapy: Secondary | ICD-10-CM | POA: Diagnosis not present

## 2017-06-13 DIAGNOSIS — N179 Acute kidney failure, unspecified: Secondary | ICD-10-CM

## 2017-06-13 DIAGNOSIS — E1122 Type 2 diabetes mellitus with diabetic chronic kidney disease: Secondary | ICD-10-CM | POA: Diagnosis not present

## 2017-06-13 DIAGNOSIS — N189 Chronic kidney disease, unspecified: Secondary | ICD-10-CM | POA: Diagnosis not present

## 2017-06-13 DIAGNOSIS — N182 Chronic kidney disease, stage 2 (mild): Secondary | ICD-10-CM

## 2017-06-13 LAB — CBC WITH DIFFERENTIAL/PLATELET
BASOS ABS: 88 {cells}/uL (ref 0–200)
Basophils Relative: 1 %
EOS PCT: 1.1 %
Eosinophils Absolute: 97 cells/uL (ref 15–500)
HEMATOCRIT: 43.5 % (ref 35.0–45.0)
HEMOGLOBIN: 15.1 g/dL (ref 11.7–15.5)
LYMPHS ABS: 1320 {cells}/uL (ref 850–3900)
MCH: 30.3 pg (ref 27.0–33.0)
MCHC: 34.7 g/dL (ref 32.0–36.0)
MCV: 87.3 fL (ref 80.0–100.0)
MPV: 9.7 fL (ref 7.5–12.5)
Monocytes Relative: 7.8 %
NEUTROS ABS: 6609 {cells}/uL (ref 1500–7800)
Neutrophils Relative %: 75.1 %
Platelets: 350 10*3/uL (ref 140–400)
RBC: 4.98 10*6/uL (ref 3.80–5.10)
RDW: 12.6 % (ref 11.0–15.0)
Total Lymphocyte: 15 %
WBC: 8.8 10*3/uL (ref 3.8–10.8)
WBCMIX: 686 {cells}/uL (ref 200–950)

## 2017-06-13 LAB — HEPATIC FUNCTION PANEL
AG RATIO: 1.7 (calc) (ref 1.0–2.5)
ALBUMIN MSPROF: 4.5 g/dL (ref 3.6–5.1)
ALKALINE PHOSPHATASE (APISO): 81 U/L (ref 33–130)
ALT: 8 U/L (ref 6–29)
AST: 19 U/L (ref 10–35)
BILIRUBIN INDIRECT: 1.2 mg/dL (ref 0.2–1.2)
Bilirubin, Direct: 0.2 mg/dL (ref 0.0–0.2)
GLOBULIN: 2.7 g/dL (ref 1.9–3.7)
Total Bilirubin: 1.4 mg/dL — ABNORMAL HIGH (ref 0.2–1.2)
Total Protein: 7.2 g/dL (ref 6.1–8.1)

## 2017-06-13 LAB — BASIC METABOLIC PANEL WITH GFR
BUN/Creatinine Ratio: 8 (calc) (ref 6–22)
BUN: 15 mg/dL (ref 7–25)
CALCIUM: 10.1 mg/dL (ref 8.6–10.4)
CO2: 27 mmol/L (ref 20–32)
CREATININE: 1.82 mg/dL — AB (ref 0.50–0.99)
Chloride: 92 mmol/L — ABNORMAL LOW (ref 98–110)
GFR, EST NON AFRICAN AMERICAN: 29 mL/min/{1.73_m2} — AB (ref >=60)
GFR, Est African American: 33 mL/min/{1.73_m2} — ABNORMAL LOW (ref >=60)
Glucose, Bld: 138 mg/dL — ABNORMAL HIGH (ref 65–99)
Potassium: 3.7 mmol/L (ref 3.5–5.3)
Sodium: 133 mmol/L — ABNORMAL LOW (ref 135–146)

## 2017-06-13 MED ORDER — GLUCOSE BLOOD VI STRP: ORAL_STRIP | 12 refills | Status: DC

## 2017-06-13 MED ORDER — ONETOUCH LANCETS MISC: 1 refills | Status: DC

## 2017-06-13 NOTE — Patient Instructions (Addendum)
Check sugars twice a day - once in the morning before eating anything, and once at another random time during the day  STOP metformin for now - we need to make sure your kidneys recover  Drink 5-6 16.9 ounce bottles of water daily - minimum 80-100 fluid ounces  Very important to make sure your kidneys recover   Targets for Glucose Readings: Time of Check Usual Target for Most People  Before Meals  70-130  Two hours after meals  Less than 180  Bedtime  90-150   Why Should You Check Your Blood Glucose? -The A1C tells you how your diabetes is doing over a 3 month period.  Home blood glucose monitoring (or checking) gives you information about your diabetes on a daily basis.  You will learn how well your diabetes care plan is working and whether your blood glucose is in your target range throughout the day.   -Reviewing daily blood glucose levels will help you and your healthcare team make any needed changes to you meal plan, physical activity and medications.  Meter Supplies: - A glucose meter was provided at your visit today along with strips and lancets. The blood glucose meter strips and lancets are usually covered by health insurance. Please let us know if they are not and contact your insurance to see which meter they prefer.  How to get a good blood sample: -Wash your hands in warm water.  You do not need to use alcohol wipes. -Massage your hands. -Choose which finger you will use.  It helps to use a different finger each time to avoid soreness. -Keep you hand below your wrist when using you lancet to "prick" your finger. -Apply gentle pressure, but do NOT squeeze your finger.  Checking your blood glucose Important Reminders: -Make sure your strips are not expired.  Check the date on the bottle. -Make sure the code on bottle matches the code on your machine. -Make sure your hands are clean and dry. -Do not use the center of your finger, it is the most sensitive area.  Use a  spot to the side of the center of your fingertip. -Completely fill the strip target area with blood (until it beeps) to make sure the results are accurate. -You will need a prescription to have your glucose strips and lancets covered by insurance. -There is usually an 800 number on the back of your meter for help with meter issues.  How often to check: -If you take diabetes pills or take one injection of insulin each day, you will usually be asked to check twice a day, before breakfast and 2 hours after one meal, or as directed. -If you take several insulin injections each day, you will usually be asked to check four times a day before meals and at bedtime every day.    Stop taking imodium; use only if having diarrhea.   Continue to increase fluids - need to drink 80-100 + fluid ounces daily  Metformin extended-release tablets What is this medicine? METFORMIN (met FOR min) is used to treat type 2 diabetes. It helps to control blood sugar. Treatment is combined with diet and exercise. This medicine can be used alone or with other medicines for diabetes. This medicine may be used for other purposes; ask your health care provider or pharmacist if you have questions. COMMON BRAND NAME(S): Fortamet, Glucophage XR, Glumetza What should I tell my health care provider before I take this medicine? They need to know if you have any  of these conditions: -anemia -dehydration -heart disease -frequently drink alcohol-containing beverages -kidney disease -liver disease -polycystic ovary syndrome -serious infection or injury -vomiting -an unusual or allergic reaction to metformin, other medicines, foods, dyes, or preservatives -pregnant or trying to get pregnant -breast-feeding How should I use this medicine? Take this medicine by mouth with a glass of water. Follow the directions on the prescription label. Take this medicine with food. Take your medicine at regular intervals. Do not take your  medicine more often than directed. Do not stop taking except on your doctor's advice. Talk to your pediatrician regarding the use of this medicine in children. Special care may be needed. Overdosage: If you think you have taken too much of this medicine contact a poison control center or emergency room at once. NOTE: This medicine is only for you. Do not share this medicine with others. What if I miss a dose? If you miss a dose, take it as soon as you can. If it is almost time for your next dose, take only that dose. Do not take double or extra doses. What may interact with this medicine? Do not take this medicine with any of the following medications: -dofetilide -certain contrast medicines given before X-rays, CT scans, MRI, or other procedures This medicine may also interact with the following medications: -acetazolamide -certain antiviral medicines for HIV or AIDS or for hepatitis, like adefovir, dolutegravir, emtricitabine, entecavir, lamivudine, paritaprevir, or tenofovir -cimetidine -cobicistat -crizotinib -dichlorphenamide -digoxin -diuretics -female hormones, like estrogens or progestins and birth control pills -glycopyrrolate -isoniazid -lamotrigine -medicines for blood pressure, heart disease, irregular heart beat -memantine -midodrine -methazolamide -morphine -niacin -phenothiazines like chlorpromazine, mesoridazine, prochlorperazine, thioridazine -phenytoin -procainamide -propantheline -quinidine -quinine -ranitidine -ranolazine -steroid medicines like prednisone or cortisone -stimulant medicines for attention disorders, weight loss, or to stay awake -thyroid medicines -topiramate -trimethoprim -trospium -vancomycin -vandetanib -zonisamide This list may not describe all possible interactions. Give your health care provider a list of all the medicines, herbs, non-prescription drugs, or dietary supplements you use. Also tell them if you smoke, drink alcohol,  or use illegal drugs. Some items may interact with your medicine. What should I watch for while using this medicine? Visit your doctor or health care professional for regular checks on your progress. A test called the HbA1C (A1C) will be monitored. This is a simple blood test. It measures your blood sugar control over the last 2 to 3 months. You will receive this test every 3 to 6 months. Learn how to check your blood sugar. Learn the symptoms of low and high blood sugar and how to manage them. Always carry a quick-source of sugar with you in case you have symptoms of low blood sugar. Examples include hard sugar candy or glucose tablets. Make sure others know that you can choke if you eat or drink when you develop serious symptoms of low blood sugar, such as seizures or unconsciousness. They must get medical help at once. Tell your doctor or health care professional if you have high blood sugar. You might need to change the dose of your medicine. If you are sick or exercising more than usual, you might need to change the dose of your medicine. Do not skip meals. Ask your doctor or health care professional if you should avoid alcohol. Many nonprescription cough and cold products contain sugar or alcohol. These can affect blood sugar. This medicine may cause ovulation in premenopausal women who do not have regular monthly periods. This may increase your chances of becoming pregnant.  You should not take this medicine if you become pregnant or think you may be pregnant. Talk with your doctor or health care professional about your birth control options while taking this medicine. Contact your doctor or health care professional right away if think you are pregnant. The tablet shell for some brands of this medicine does not dissolve. This is normal. The tablet shell may appear whole in the stool. This is not a cause for concern. If you are going to need surgery, a MRI, CT scan, or other procedure, tell your  doctor that you are taking this medicine. You may need to stop taking this medicine before the procedure. Wear a medical ID bracelet or chain, and carry a card that describes your disease and details of your medicine and dosage times. What side effects may I notice from receiving this medicine? Side effects that you should report to your doctor or health care professional as soon as possible: -allergic reactions like skin rash, itching or hives, swelling of the face, lips, or tongue -breathing problems -feeling faint or lightheaded, falls -muscle aches or pains -signs and symptoms of low blood sugar such as feeling anxious, confusion, dizziness, increased hunger, unusually weak or tired, sweating, shakiness, cold, irritable, headache, blurred vision, fast heartbeat, loss of consciousness -slow or irregular heartbeat -unusual stomach pain or discomfort -unusually tired or weak Side effects that usually do not require medical attention (report to your doctor or health care professional if they continue or are bothersome): -diarrhea -headache -heartburn -metallic taste in mouth -nausea -stomach gas, upset This list may not describe all possible side effects. Call your doctor for medical advice about side effects. You may report side effects to FDA at 1-800-FDA-1088. Where should I keep my medicine? Keep out of the reach of children. Store at room temperature between 15 and 30 degrees C (59 and 86 degrees F). Protect from light. Throw away any unused medicine after the expiration date. NOTE: This sheet is a summary. It may not cover all possible information. If you have questions about this medicine, talk to your doctor, pharmacist, or health care provider.  2018 [employer] (2015-12-23 15:47:35)

## 2017-06-16 LAB — URINALYSIS W MICROSCOPIC + REFLEX CULTURE
BACTERIA UA: NONE SEEN /HPF
BILIRUBIN URINE: NEGATIVE
Glucose, UA: NEGATIVE
HGB URINE DIPSTICK: NEGATIVE
Hyaline Cast: NONE SEEN /LPF
KETONES UR: NEGATIVE
NITRITES URINE, INITIAL: NEGATIVE
Protein, ur: NEGATIVE
RBC / HPF: NONE SEEN /HPF (ref 0–2)
SPECIFIC GRAVITY, URINE: 1.006 (ref 1.001–1.03)
SQUAMOUS EPITHELIAL / LPF: NONE SEEN /HPF (ref <=5)
pH: 5.5 (ref 5.0–8.0)

## 2017-06-16 LAB — URINE CULTURE
ISOLATE 1: NO GROWTH
MICRO NUMBER: 81433562
SPECIMEN QUALITY:: ADEQUATE

## 2017-06-16 LAB — CULTURE INDICATED

## 2017-07-06 ENCOUNTER — Encounter: Payer: Self-pay | Admitting: Internal Medicine

## 2017-07-06 ENCOUNTER — Ambulatory Visit: Payer: Medicare Other | Admitting: Internal Medicine

## 2017-07-06 VITALS — BP 130/82 | HR 88 | Temp 97.7°F | Resp 16 | Ht 65.0 in | Wt 147.2 lb

## 2017-07-06 DIAGNOSIS — I10 Essential (primary) hypertension: Secondary | ICD-10-CM

## 2017-07-06 DIAGNOSIS — Z136 Encounter for screening for cardiovascular disorders: Secondary | ICD-10-CM | POA: Diagnosis not present

## 2017-07-06 DIAGNOSIS — G301 Alzheimer's disease with late onset: Secondary | ICD-10-CM

## 2017-07-06 DIAGNOSIS — F32A Depression, unspecified: Secondary | ICD-10-CM

## 2017-07-06 DIAGNOSIS — E782 Mixed hyperlipidemia: Secondary | ICD-10-CM

## 2017-07-06 DIAGNOSIS — Z79899 Other long term (current) drug therapy: Secondary | ICD-10-CM

## 2017-07-06 DIAGNOSIS — N184 Chronic kidney disease, stage 4 (severe): Secondary | ICD-10-CM

## 2017-07-06 DIAGNOSIS — Z1212 Encounter for screening for malignant neoplasm of rectum: Secondary | ICD-10-CM

## 2017-07-06 DIAGNOSIS — F329 Major depressive disorder, single episode, unspecified: Secondary | ICD-10-CM

## 2017-07-06 DIAGNOSIS — Z0000 Encounter for general adult medical examination without abnormal findings: Secondary | ICD-10-CM | POA: Diagnosis not present

## 2017-07-06 DIAGNOSIS — E039 Hypothyroidism, unspecified: Secondary | ICD-10-CM

## 2017-07-06 DIAGNOSIS — Z0001 Encounter for general adult medical examination with abnormal findings: Secondary | ICD-10-CM

## 2017-07-06 DIAGNOSIS — F0280 Dementia in other diseases classified elsewhere without behavioral disturbance: Secondary | ICD-10-CM

## 2017-07-06 DIAGNOSIS — Z1211 Encounter for screening for malignant neoplasm of colon: Secondary | ICD-10-CM

## 2017-07-06 DIAGNOSIS — E559 Vitamin D deficiency, unspecified: Secondary | ICD-10-CM

## 2017-07-06 DIAGNOSIS — E1122 Type 2 diabetes mellitus with diabetic chronic kidney disease: Secondary | ICD-10-CM

## 2017-07-06 NOTE — Patient Instructions (Signed)

## 2017-07-06 NOTE — Progress Notes (Signed)
[facility] [name], M.D.     Marie [name]. Silverio Lay, P.A.-C Liane Comber, [place] [address] [place] [address] Telephone [phone] Telefax [phone] Annual Screening/Preventative Visit & Comprehensive Evaluation &  Examination     This very nice 67 y.o. MWF presents for a Screening/Preventative Visit & comprehensive evaluation and management of multiple medical co-morbidities.  Patient has been followed for HTN, T2_NIDDM, Hyperlipidemia and Vitamin D Deficiency.  Patient has been on SS Disability since 2015 for Depression & Dementia and is followed by Dr Toy Care.       HTN predates since 2010. Patient's BP has been controlled at home and patient denies any cardiac symptoms as chest pain, palpitations, shortness of breath, dizziness or ankle swelling. Today's BP is at goal -130/82      Patient's hyperlipidemia is not controlled with diet and she has not been compliant with taking her Atorvastatin. Last lipids were not at goal: Lab Results  Component Value Date   CHOL 319 (H) 03/28/2017   HDL 45 (L) 03/28/2017   LDLCALC 171 (H) 09/12/2016   TRIG 211 (H) 03/28/2017   CHOLHDL 7.1 (H) 03/28/2017      Patient has T2_NIDDM predating since 2000 and patient denies reactive hypoglycemic symptoms, visual blurring, diabetic polys, or paresthesias. 1 month ago , patient had labs done showing GFR had dropped from 53 three months ago to 25 1 month ago and she was advised (06/06/2017) to stop her metformin. Patient was encouraged to drink 6 bottles of water /day and repeat GFR 3 weeks ago was 29. She admits that she only drinks 2 bottles of water/day. Her husband has been monitoring her CBG's which alleged are ranging betw 140-180 mg%.  Patient has lost 16# over the last 6 months. Last A1c was not at goal: Lab Results  Component Value Date   HGBA1C 6.5 (H) 03/28/2017      Finally, patient has history of  Vitamin D Deficiency ("29"/2008) and she will not supplement as repeatedly requested. last Vitamin D was very low: Lab Results  Component Value Date   VD25OH 17 (L) 03/28/2017   Current Outpatient Medications on File Prior to Visit  Medication Sig  . atorvastatin (LIPITOR) 80 MG tablet TAKE ONE TABLET BY MOUTH AT BEDTIME FOR CHOLESTEROL  . Blood Glucose Monitoring Suppl (ONETOUCH VERIO) w/Device KIT 1 kit by Does not apply route as needed. Use to check glucose QD DX-E11.22  . cholecalciferol (VITAMIN D) 1000 units tablet Take 1,000 Units by mouth daily.  . DULoxetine (CYMBALTA) 60 MG capsule Take 1 capsule (60 mg total) by mouth daily.  Marland Kitchen glucose blood test strip CHECK BLOOD SUGAR 1 TIME DAILY-DX e11.22  . memantine (NAMENDA XR) 28 MG CP24 24 hr capsule Take 28 mg by mouth.  . ondansetron (ZOFRAN) 8 MG tablet Take 1/2 to 1 tablet 2 to 3 x / day if needed for nausea / vomiting  . ONE TOUCH LANCETS MISC Use as directed to check glucose daily. DX-E11.22  . ranitidine (ZANTAC) 300 MG tablet Take 1 tablet 2 x / day for indigestion  . metFORMIN (GLUCOPHAGE-XR) 500 MG 24 hr tablet TAKE 2 TABLETS BY MOUTH 2 TIMES A DAY AFTER MEALS (Patient not taking: Reported on 07/06/2017)   No current facility-administered medications on file prior to visit.    Allergies  Allergen Reactions  . Penicillins Rash    Swelling in face - last 10 years   Past Medical History:  Diagnosis Date  . Dementia   . Depression   . Diabetes mellitus   . Hypertension   . Thyroid disease   . Type II or unspecified type diabetes mellitus without mention of complication, not stated as uncontrolled   . Vitamin D deficiency    Health Maintenance  Topic Date Due  . MAMMOGRAM  11/13/2001  . PAP SMEAR  04/18/2015  . OPHTHALMOLOGY EXAM  07/29/2016  . DEXA SCAN  11/13/2016  . URINE MICROALBUMIN  06/08/2017  . FOOT EXAM  06/11/2017  . HEMOGLOBIN A1C  09/26/2017  . COLONOSCOPY  04/26/2019  . TETANUS/TDAP  08/10/2025  .  INFLUENZA VACCINE  Completed  . Hepatitis C Screening  Completed  . HIV Screening  Completed  . PNA vac Low Risk Adult  Completed   Immunization History  Administered Date(s) Administered  . DT 11/25/2005, 08/11/2015  . Influenza, High Dose Seasonal PF 03/28/2017  . Pneumococcal Conjugate-13 08/04/2014  . Pneumococcal Polysaccharide-23 03/28/1999, 03/28/2017  . Zoster 04/28/2014   Last Colon -  Last Pap -  Past Surgical History:  Procedure Laterality Date  . FRACTURE SURGERY  2009   right arm; rod  . TOTAL KNEE ARTHROPLASTY  june 2013   left   Family History  Problem Relation Age of Onset  . Colon cancer Neg Hx   . Stomach cancer Neg Hx    Social History   Tobacco Use  . Smoking status: Never Smoker  . Smokeless tobacco: Never Used  Substance Use Topics  . Alcohol use: No  . Drug use: No    ROS Constitutional: Denies fever, chills, weight loss/gain, headaches, insomnia,  night sweats, and change in appetite. Does c/o fatigue. Eyes: Denies redness, blurred vision, diplopia, discharge, itchy, watery eyes.  ENT: Denies discharge, congestion, post nasal drip, epistaxis, sore throat, earache, hearing loss, dental pain, Tinnitus, Vertigo, Sinus pain, snoring.  Cardio: Denies chest pain, palpitations, irregular heartbeat, syncope, dyspnea, diaphoresis, orthopnea, PND, claudication, edema Respiratory: denies cough, dyspnea, DOE, pleurisy, hoarseness, laryngitis, wheezing.  Gastrointestinal: Denies dysphagia, heartburn, reflux, water brash, pain, cramps, nausea, vomiting, bloating, diarrhea, constipation, hematemesis, melena, hematochezia, jaundice, hemorrhoids Genitourinary: Denies dysuria, frequency, urgency, nocturia, hesitancy, discharge, hematuria, flank pain Breast: Breast lumps, nipple discharge, bleeding.  Musculoskeletal: Denies arthralgia, myalgia, stiffness, Jt. Swelling, pain, limp, and strain/sprain. Denies falls. Skin: Denies puritis, rash, hives, warts, acne,  eczema, changing in skin lesion Neuro: No weakness, tremor, incoordination, spasms, paresthesia, pain Psychiatric: Denies confusion, memory loss, sensory loss. Denies Depression. Endocrine: Denies change in weight, skin, hair change, nocturia, and paresthesia, diabetic polys, visual blurring, hyper / hypo glycemic episodes.  Heme/Lymph: No excessive bleeding, bruising, enlarged lymph nodes.  Physical Exam  BP 130/82   Pulse 88   Temp 97.7 F (36.5 C)   Resp 16   Ht 5' 5"  (1.651 m)   Wt 147 lb 3.2 oz (66.8 kg)   BMI 24.50 kg/m   General Appearance: Well nourished, well groomed and in no apparent distress.  Eyes: PERRLA, EOMs, conjunctiva no swelling or erythema, normal fundi and vessels. Sinuses: No frontal/maxillary tenderness ENT/Mouth: EACs patent / TMs  nl. Nares clear without erythema, swelling, mucoid exudates. Oral hygiene is good. No erythema, swelling, or exudate. Tongue normal, non-obstructing. Tonsils not swollen or erythematous. Hearing normal.  Neck: Supple, thyroid normal. No bruits, nodes or JVD. Respiratory: Respiratory effort normal.  BS equal and clear bilateral without rales, rhonci, wheezing or stridor. Cardio: Heart sounds are normal with regular rate and rhythm and no murmurs,  rubs or gallops. Peripheral pulses are normal and equal bilaterally without edema. No aortic or femoral bruits. Chest: symmetric with normal excursions and percussion. Breasts: Symmetric, without lumps, nipple discharge, retractions, or fibrocystic changes.  Abdomen: Flat, soft with bowel sounds active. Nontender, no guarding, rebound, hernias, masses, or organomegaly.  Lymphatics: Non tender without lymphadenopathy.  Genitourinary:  Musculoskeletal: Full ROM all peripheral extremities, joint stability, 5/5 strength, and normal gait. Skin: Warm and dry without rashes, lesions, cyanosis, clubbing or  ecchymosis.  Neuro: Cranial nerves intact, reflexes equal bilaterally. Normal muscle tone,  no cerebellar symptoms. Sensation intact.Marland Kitchen Pysch: Alert and oriented X 3, indifferent affect with limited Insight and Judgment. Patient does not comprehend the degree of her cognitive lossesandis suffering from substantial cognitive impairment due to dementia. Assessment and Plan  1. Annual Preventative Screening Examination  2. Essential hypertension  - EKG 12-Lead - Urinalysis, Routine w reflex microscopic - Microalbumin / creatinine urine ratio - CBC with Differential/Platelet - BASIC METABOLIC PANEL WITH GFR - Magnesium - TSH  3. Hyperlipidemia, mixed  - EKG 12-Lead - Hepatic function panel - Lipid panel - TSH  4. Type 2 diabetes mellitus with stage 4 chronic kidney disease, without long-term current use of insulin (HCC)  - EKG 12-Lead - Urinalysis, Routine w reflex microscopic - Microalbumin / creatinine urine ratio - HM DIABETES FOOT EXAM - LOW EXTREMITY NEUR EXAM DOCUM - Hemoglobin A1c - Insulin, random  5. Vitamin D deficiency  - VITAMIN D 25 Hydroxy  6. Hypothyroidism, unspecified type  - TSH  7. Screening for colorectal cancer  - POC Hemoccult Bld/Stl  8. Screening for ischemic heart disease  - EKG 12-Lead  9. Medication management  - Urinalysis, Routine w reflex microscopic - Microalbumin / creatinine urine ratio - Uric acid - CBC with Differential/Platelet - BASIC METABOLIC PANEL WITH GFR - Hepatic function panel - Magnesium - Lipid panel - TSH - Hemoglobin A1c - Insulin, random - VITAMIN D 25 Hydroxyl        Patient was counseled in prudent diet to achieve/maintain BMI less than 25 for weight control, BP monitoring, regular exercise and medications. Discussed med's effects and SE's. Screening labs and tests as requested with regular follow-up as recommended. Over 50 minutes of exam, counseling, chart review and high complex critical decision making was performed.

## 2017-07-07 LAB — MAGNESIUM: MAGNESIUM: 1.8 mg/dL (ref 1.5–2.5)

## 2017-07-07 LAB — BASIC METABOLIC PANEL WITH GFR
BUN/Creatinine Ratio: 7 (calc) (ref 6–22)
BUN: 10 mg/dL (ref 7–25)
CHLORIDE: 103 mmol/L (ref 98–110)
CO2: 34 mmol/L — AB (ref 20–32)
Calcium: 9.3 mg/dL (ref 8.6–10.4)
Creat: 1.36 mg/dL — ABNORMAL HIGH (ref 0.50–0.99)
GFR, Est African American: 47 mL/min/{1.73_m2} — ABNORMAL LOW (ref >=60)
GFR, Est Non African American: 41 mL/min/{1.73_m2} — ABNORMAL LOW (ref >=60)
GLUCOSE: 115 mg/dL — AB (ref 65–99)
Potassium: 4 mmol/L (ref 3.5–5.3)
SODIUM: 142 mmol/L (ref 135–146)

## 2017-07-07 LAB — URINALYSIS, ROUTINE W REFLEX MICROSCOPIC
Bilirubin Urine: NEGATIVE
Glucose, UA: NEGATIVE
Hgb urine dipstick: NEGATIVE
Ketones, ur: NEGATIVE
LEUKOCYTES UA: NEGATIVE
Nitrite: NEGATIVE
PH: 6 (ref 5.0–8.0)
Protein, ur: NEGATIVE
SPECIFIC GRAVITY, URINE: 1.007 (ref 1.001–1.03)

## 2017-07-07 LAB — CBC WITH DIFFERENTIAL/PLATELET
BASOS PCT: 1.4 %
Basophils Absolute: 71 cells/uL (ref 0–200)
EOS ABS: 128 {cells}/uL (ref 15–500)
EOS PCT: 2.5 %
HCT: 38.1 % (ref 35.0–45.0)
Hemoglobin: 13.2 g/dL (ref 11.7–15.5)
Lymphs Abs: 898 cells/uL (ref 850–3900)
MCH: 30.5 pg (ref 27.0–33.0)
MCHC: 34.6 g/dL (ref 32.0–36.0)
MCV: 88 fL (ref 80.0–100.0)
MONOS PCT: 6.8 %
MPV: 9.1 fL (ref 7.5–12.5)
NEUTROS ABS: 3657 {cells}/uL (ref 1500–7800)
Neutrophils Relative %: 71.7 %
PLATELETS: 311 10*3/uL (ref 140–400)
RBC: 4.33 10*6/uL (ref 3.80–5.10)
RDW: 12.4 % (ref 11.0–15.0)
TOTAL LYMPHOCYTE: 17.6 %
WBC mixed population: 347 cells/uL (ref 200–950)
WBC: 5.1 10*3/uL (ref 3.8–10.8)

## 2017-07-07 LAB — LIPID PANEL
CHOL/HDL RATIO: 6.8 (calc) — AB (ref <5.0)
CHOLESTEROL: 287 mg/dL — AB (ref <200)
HDL: 42 mg/dL — ABNORMAL LOW (ref >50)
LDL Cholesterol (Calc): 200 mg/dL (calc) — ABNORMAL HIGH
Non-HDL Cholesterol (Calc): 245 mg/dL (calc) — ABNORMAL HIGH (ref <130)
Triglycerides: 254 mg/dL — ABNORMAL HIGH (ref <150)

## 2017-07-07 LAB — HEPATIC FUNCTION PANEL
AG Ratio: 1.5 (calc) (ref 1.0–2.5)
ALKALINE PHOSPHATASE (APISO): 68 U/L (ref 33–130)
ALT: 8 U/L (ref 6–29)
AST: 13 U/L (ref 10–35)
Albumin: 4 g/dL (ref 3.6–5.1)
BILIRUBIN INDIRECT: 0.7 mg/dL (ref 0.2–1.2)
Bilirubin, Direct: 0.1 mg/dL (ref 0.0–0.2)
Globulin: 2.6 g/dL (calc) (ref 1.9–3.7)
TOTAL PROTEIN: 6.6 g/dL (ref 6.1–8.1)
Total Bilirubin: 0.8 mg/dL (ref 0.2–1.2)

## 2017-07-07 LAB — TSH: TSH: 4.07 mIU/L (ref 0.40–4.50)

## 2017-07-07 LAB — HEMOGLOBIN A1C
EAG (MMOL/L): 7.6 (calc)
HEMOGLOBIN A1C: 6.4 %{Hb} — AB (ref <5.7)
MEAN PLASMA GLUCOSE: 137 (calc)

## 2017-07-07 LAB — URIC ACID: URIC ACID, SERUM: 5.7 mg/dL (ref 2.5–7.0)

## 2017-07-07 LAB — MICROALBUMIN / CREATININE URINE RATIO
Creatinine, Urine: 49 mg/dL (ref 20–275)
MICROALB/CREAT RATIO: 4 ug/mg{creat} (ref <30)
Microalb, Ur: 0.2 mg/dL

## 2017-07-07 LAB — INSULIN, RANDOM: INSULIN: 4.6 u[IU]/mL (ref 2.0–19.6)

## 2017-07-07 LAB — VITAMIN D 25 HYDROXY (VIT D DEFICIENCY, FRACTURES): Vit D, 25-Hydroxy: 19 ng/mL — ABNORMAL LOW (ref 30–100)

## 2017-10-04 DIAGNOSIS — E1122 Type 2 diabetes mellitus with diabetic chronic kidney disease: Secondary | ICD-10-CM | POA: Insufficient documentation

## 2017-10-04 DIAGNOSIS — N183 Chronic kidney disease, stage 3 (moderate): Secondary | ICD-10-CM

## 2017-10-04 LAB — HM DIABETES EYE EXAM

## 2017-10-04 NOTE — Progress Notes (Signed)
MEDICARE ANNUAL WELLNESS VISIT AND FOLLOW UP  Assessment:   Diagnoses and all orders for this visit:  Encounter for Medicare annual wellness exam  Essential hypertension  Currently at goal off of medications Monitor blood pressure at home; call if consistently over 130/80 Continue DASH diet.   Reminder to go to the ER if any CP, SOB, nausea, dizziness, severe HA, changes vision/speech, left arm numbness and tingling and jaw pain.  Non-seasonal allergic rhinitis, unspecified trigger Continue OTC allergy pills  Type 2 diabetes mellitus with stage 3 chronic kidney disease, without long-term current use of insulin (HCC) Currently off of metformin secondary to acute kidney injury; pending A1C/GFR restart metformin vs sulfonylurea Education: Reviewed 'ABCs' of diabetes management (respective goals in parentheses):  A1C (<7), blood pressure (<130/80), and cholesterol (LDL <70) Eye Exam yearly and Dental Exam every 6 months. Dietary recommendations Physical Activity recommendations Just had foot exam - defer Reminded to check feet daily  CKD stage 3 due to type 2 diabetes mellitus (HCC) Increase fluids, avoid NSAIDS, monitor sugars, will monitor Check BMP/GFR Refer to nephrology if insufficient recovery from recent acute decline/injury -  Poorly adherent to recommendations for fluid intake  SDAT  Continue namenda Managed at this office Doing well with ADLs; family members are around to help monitor at home Doesn't drive by herself Needs to be reminded to drink sufficient fluids; strategies discussed  Vitamin D deficiency Start on vitamin D 5000 IU daily; titrate for goal of 70-100 Check vitamin D  Medication management CBC, BMP/GFR, LFTs  Hyperlipidemia Poorly controlled; ?adherence, emphasized risks and need for compliance Titrate statin as tolerated Continue medications: atorvastatin Continue low cholesterol diet and exercise.  Check lipid panel.   Generalized anxiety  disorder Managed by Dr. Toy Care  Depression, controlled Managed by Dr. Toy Care  Body mass index (BMI) of 24.0-24.9 in adult Continue to recommend diet heavy in fruits and veggies and low in animal meats, cheeses, and dairy products, appropriate calorie intake Discuss exercise recommendations routinely Continue to monitor weight at each visit  Need for mammogram screening Patient agreeable to pursuing this; information to call to arrange was provided.   Estrogen deficiency DEXA scan ordered after discussion with patient today  Over 40 minutes of exam, counseling, chart review and critical decision making was performed Future Appointments  Date Time Provider [place]  01/09/2018  2:30 PM [name], MD [facility] None  07/25/2018  2:00 PM [name], MD [facility] None  10/15/2018  2:00 PM Liane Comber, NP [facility] None     Plan:   During the course of the visit the patient was educated and counseled about appropriate screening and preventive services including:    Pneumococcal vaccine   Prevnar 13  Influenza vaccine  Td vaccine  Screening electrocardiogram  Bone densitometry screening  Colorectal cancer screening  Diabetes screening  Glaucoma screening  Nutrition counseling   Advanced directives: requested   Subjective:  Marie [name] is a 67 y.o. female who presents for Medicare Annual Wellness Visit and 3 month follow up. Patient has been on SS Disability since 2015 for Depression & Dementia (on namenda) and is followed by Dr Toy Care.  BMI is Body mass index is 25.46 kg/m., she has not been working on diet and exercise. Wt Readings from Last 3 Encounters:  10/05/17 153 lb (69.4 kg)  07/06/17 147 lb 3.2 oz (66.8 kg)  06/13/17 141 lb (64 kg)    Her blood pressure has been controlled at home, today their BP is  BP: 134/76 She does workout. She denies chest pain, shortness of breath, dizziness.   She is on cholesterol medication  (atorvastatin 80 mg daily) and denies myalgias. Her cholesterol is not at goal. The cholesterol last visit was:   Lab Results  Component Value Date   CHOL 287 (H) 07/06/2017   HDL 42 (L) 07/06/2017   LDLCALC 200 (H) 07/06/2017   TRIG 254 (H) 07/06/2017   CHOLHDL 6.8 (H) 07/06/2017   . She has been working on diet and exercise for T2DM, and denies foot ulcerations, increased appetite, nausea, paresthesia of the feet, polydipsia, polyuria, visual disturbances, vomiting and weight loss. Reports fasting sugars running 130s. Last A1C in the office was:  Lab Results  Component Value Date   HGBA1C 6.4 (H) 07/06/2017   Last GFR: Lab Results  Component Value Date   GFRNONAA 41 (L) 07/06/2017   Patient is reportedly on vitamin D supplement but remains well below goal at recent check:  Lab Results  Component Value Date   VD25OH 19 (L) 07/06/2017      Medication Review: Current Outpatient Medications on File Prior to Visit  Medication Sig Dispense Refill  . atorvastatin (LIPITOR) 80 MG tablet TAKE ONE TABLET BY MOUTH AT BEDTIME FOR CHOLESTEROL 90 tablet 0  . Blood Glucose Monitoring Suppl (ONETOUCH VERIO) w/Device KIT 1 kit by Does not apply route as needed. Use to check glucose QD DX-E11.22 1 kit 0  . DULoxetine (CYMBALTA) 60 MG capsule Take 1 capsule (60 mg total) by mouth daily. 90 capsule 3  . glucose blood test strip CHECK BLOOD SUGAR 1 TIME DAILY-DX e11.22 100 each 12  . memantine (NAMENDA XR) 28 MG CP24 24 hr capsule Take 28 mg by mouth.    . Multiple Vitamin (MULTIVITAMIN) tablet Take 1 tablet by mouth daily.    . ondansetron (ZOFRAN) 8 MG tablet Take 1/2 to 1 tablet 2 to 3 x / day if needed for nausea / vomiting 30 tablet 2  . ONE TOUCH LANCETS MISC Use as directed to check glucose daily. DX-E11.22 100 each 1  . cholecalciferol (VITAMIN D) 1000 units tablet Take 1,000 Units by mouth daily.    . metFORMIN (GLUCOPHAGE-XR) 500 MG 24 hr tablet TAKE 2 TABLETS BY MOUTH 2 TIMES A DAY AFTER  MEALS (Patient not taking: Reported on 07/06/2017) 360 tablet 0  . ranitidine (ZANTAC) 300 MG tablet Take 1 tablet 2 x / day for indigestion (Patient not taking: Reported on 10/05/2017) 60 tablet 2   No current facility-administered medications on file prior to visit.     Allergies  Allergen Reactions  . Penicillins Rash    Swelling in face - last 10 years    Current Problems (verified) Patient Active Problem List   Diagnosis Date Noted  . CKD stage 3 due to type 2 diabetes mellitus ([place]) 10/04/2017  . Body mass index (BMI) of 24.0-24.9 in adult 05/05/2015  . Generalized anxiety disorder 08/04/2014  . Essential hypertension 04/28/2014  . Medication management 04/28/2014  . Vitamin D deficiency 10/04/2013  . 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 Depression, controlled   . SDAT    . Hyperlipidemia 07/28/2007  . Allergic rhinitis 07/28/2007    Screening Tests Immunization History  Administered Date(s) Administered  . DT 11/25/2005, 08/11/2015  . Influenza, High Dose Seasonal PF 03/28/2017  . Pneumococcal Conjugate-13 08/04/2014  . Pneumococcal Polysaccharide-23 03/28/1999, 03/28/2017  . Zoster 04/28/2014   Preventative care: Last colonoscopy: 2013 Mammogram: Due, has  not been done since 2015 - will schedule Last pap smear/pelvic exam: 2013? Declines further DEXA:  DUE   Prior vaccinations: TD or Tdap: 2017  Influenza: 2018 Pneumococcal: 2000, 2018 Prevnar13: 2016 Shingles/Zostavax: 2015  Names of Other Physician/Practitioners you currently use: 1. [facility] here for primary care 2. Dr. Delman Cheadle, eye doctor, last visit 2019,  Saw yesterday - MD said would send report 3. [place], dentist, last visit 2019  Patient Care Team: [name], MD as PCP - General (Internal Medicine) Chucky May, MD as Consulting Physician (Psychiatry)  SURGICAL HISTORY She  has a past surgical history that includes Total  knee arthroplasty (june 2013) and Fracture surgery (2009). FAMILY HISTORY Her family history is not on file. SOCIAL HISTORY She  reports that she has never smoked. She has never used smokeless tobacco. She reports that she does not drink alcohol or use drugs.   MEDICARE WELLNESS OBJECTIVES: Physical activity: Current Exercise Habits: Home exercise routine, Type of exercise: walking, Time (Minutes): 20, Frequency (Times/Week): 7, Weekly Exercise (Minutes/Week): 140, Intensity: Mild, Exercise limited by: None identified Cardiac risk factors: Cardiac Risk Factors include: advanced age (>70mn, >>94women);dyslipidemia;hypertension Depression/mood screen:   [facility] 10/05/2017  Decreased Interest 0  Down, Depressed, Hopeless 1  PHQ - 2 Score 1  Altered sleeping -  Tired, decreased energy -  Change in appetite -  Feeling bad or failure about yourself  -  Trouble concentrating -  Moving slowly or fidgety/restless -  Suicidal thoughts -  PHQ-9 Score -  Difficult doing work/chores -    ADLs:  In your present state of health, do you have any difficulty performing the following activities: 10/05/2017 07/06/2017  Hearing? N N  Vision? N N  Difficulty concentrating or making decisions? Y N  Comment Memory issues following surgery; has disability. Managed by our office.  -  Walking or climbing stairs? N N  Dressing or bathing? N N  Doing errands, shopping? N N  Comment Limited driving; short distances only; long distances by husband.  -Facilities managerand eating ? N -  Using the Toilet? N -  In the past six months, have you accidently leaked urine? N -  Do you have problems with loss of bowel control? N -  Managing your Medications? Y -  Comment Husband arranges -  Managing your Finances? Y -  Comment Husband manages -  Housekeeping or managing your Housekeeping? N -  Some recent data might be hidden     Cognitive Testing  Alert? Yes  Normal Appearance?Yes  Oriented  to person? Yes  Place? Yes   Time? Yes  Recall of three objects?  2/3  Can perform simple calculations? Yes  Displays appropriate judgment?Yes  Can read the correct time from a watch face?Yes  EOL planning: Does Patient Have a Medical Advance Directive?: No Would patient like information on creating a medical advance directive?: Yes (MAU/Ambulatory/Procedural Areas - Information given)  Review of Systems  Constitutional: Negative for malaise/fatigue and weight loss.  HENT: Negative for hearing loss and tinnitus.   Eyes: Negative for blurred vision and double vision.  Respiratory: Negative for cough, sputum production, shortness of breath and wheezing.   Cardiovascular: Negative for chest pain, palpitations, orthopnea, claudication, leg swelling and PND.  Gastrointestinal: Negative for abdominal pain, blood in stool, constipation, diarrhea, heartburn, melena, nausea and vomiting.  Genitourinary: Negative.   Musculoskeletal: Negative for falls, joint pain and myalgias.  Skin: Negative  for rash.  Neurological: Negative for dizziness, tingling, sensory change, weakness and headaches.  Endo/Heme/Allergies: Negative for polydipsia.  Psychiatric/Behavioral: Negative.  Negative for depression, memory loss, substance abuse and suicidal ideas. The patient is not nervous/anxious and does not have insomnia.   All other systems reviewed and are negative.    Objective:     Today's Vitals   10/05/17 1356  BP: 134/76  Pulse: 82  Temp: (!) 97.5 F (36.4 C)  SpO2: 98%  Weight: 153 lb (69.4 kg)  Height: 5' 5"  (1.651 m)   Body mass index is 25.46 kg/m.  General appearance: alert, no distress, WD/WN, female HEENT: normocephalic, sclerae anicteric, TMs pearly, nares patent, no discharge or erythema, pharynx normal Oral cavity: MMM, no lesions Neck: supple, no lymphadenopathy, no thyromegaly, no masses Heart: RRR, normal S1, S2, no murmurs Lungs: CTA bilaterally, no wheezes, rhonchi, or  rales Abdomen: +bs, soft, non tender, non distended, no masses, no hepatomegaly, no splenomegaly Musculoskeletal: nontender, no swelling, no obvious deformity Extremities: no edema, no cyanosis, no clubbing Pulses: 2+ symmetric, upper and lower extremities, normal cap refill Neurological: alert, oriented x 3, CN2-12 intact, strength normal upper extremities and lower extremities, sensation normal throughout, DTRs 2+ throughout, no cerebellar signs, gait normal Psychiatric: normal affect, behavior normal, pleasant   Medicare Attestation I have personally reviewed: The patient's medical and social history Their use of alcohol, tobacco or illicit drugs Their current medications and supplements The patient's functional ability including ADLs,fall risks, home safety risks, cognitive, and hearing and visual impairment Diet and physical activities Evidence for depression or mood disorders  The patient's weight, height, BMI, and visual acuity have been recorded in the chart.  I have made referrals, counseling, and provided education to the patient based on review of the above and I have provided the patient with a written personalized care plan for preventive services.     Izora Ribas, NP   10/05/2017

## 2017-10-05 ENCOUNTER — Encounter: Payer: Self-pay | Admitting: Adult Health

## 2017-10-05 ENCOUNTER — Ambulatory Visit: Payer: Medicare Other | Admitting: Adult Health

## 2017-10-05 VITALS — BP 134/76 | HR 82 | Temp 97.5°F | Ht 65.0 in | Wt 153.0 lb

## 2017-10-05 DIAGNOSIS — E1122 Type 2 diabetes mellitus with diabetic chronic kidney disease: Secondary | ICD-10-CM | POA: Diagnosis not present

## 2017-10-05 DIAGNOSIS — J3089 Other allergic rhinitis: Secondary | ICD-10-CM

## 2017-10-05 DIAGNOSIS — R6889 Other general symptoms and signs: Secondary | ICD-10-CM

## 2017-10-05 DIAGNOSIS — E2839 Other primary ovarian failure: Secondary | ICD-10-CM | POA: Diagnosis not present

## 2017-10-05 DIAGNOSIS — Z6824 Body mass index (BMI) 24.0-24.9, adult: Secondary | ICD-10-CM | POA: Diagnosis not present

## 2017-10-05 DIAGNOSIS — G301 Alzheimer's disease with late onset: Secondary | ICD-10-CM

## 2017-10-05 DIAGNOSIS — Z79899 Other long term (current) drug therapy: Secondary | ICD-10-CM

## 2017-10-05 DIAGNOSIS — F329 Major depressive disorder, single episode, unspecified: Secondary | ICD-10-CM | POA: Diagnosis not present

## 2017-10-05 DIAGNOSIS — I10 Essential (primary) hypertension: Secondary | ICD-10-CM

## 2017-10-05 DIAGNOSIS — Z1239 Encounter for other screening for malignant neoplasm of breast: Secondary | ICD-10-CM

## 2017-10-05 DIAGNOSIS — F411 Generalized anxiety disorder: Secondary | ICD-10-CM

## 2017-10-05 DIAGNOSIS — F0280 Dementia in other diseases classified elsewhere without behavioral disturbance: Secondary | ICD-10-CM

## 2017-10-05 DIAGNOSIS — E782 Mixed hyperlipidemia: Secondary | ICD-10-CM | POA: Diagnosis not present

## 2017-10-05 DIAGNOSIS — E559 Vitamin D deficiency, unspecified: Secondary | ICD-10-CM

## 2017-10-05 DIAGNOSIS — Z0000 Encounter for general adult medical examination without abnormal findings: Secondary | ICD-10-CM

## 2017-10-05 DIAGNOSIS — F32A Depression, unspecified: Secondary | ICD-10-CM

## 2017-10-05 DIAGNOSIS — E039 Hypothyroidism, unspecified: Secondary | ICD-10-CM

## 2017-10-05 DIAGNOSIS — N183 Chronic kidney disease, stage 3 (moderate): Secondary | ICD-10-CM

## 2017-10-05 DIAGNOSIS — Z0001 Encounter for general adult medical examination with abnormal findings: Secondary | ICD-10-CM

## 2017-10-05 DIAGNOSIS — G2581 Restless legs syndrome: Secondary | ICD-10-CM | POA: Diagnosis not present

## 2017-10-05 DIAGNOSIS — Z1231 Encounter for screening mammogram for malignant neoplasm of breast: Secondary | ICD-10-CM

## 2017-10-05 NOTE — Patient Instructions (Addendum)
[facility]  7 a.m.-6:30 p.m., Monday 7 a.m.-5 p.m., Tuesday-Friday Schedule an appointment by calling [phone].  [facility] Schedule an appointment by calling [phone].   Start on a vitamin D supplement (cholecalciferol D3) 5000 IU daily   Aim for 7+ servings of fruits and vegetables daily  80+ fluid ounces of water or unsweet tea for healthy kidneys  Limit alcohol intake  Limit animal fats in diet for cholesterol and heart health - choose grass fed whenever available  Aim for low stress - take time to unwind and care for your mental health  Aim for 150 min of moderate intensity exercise weekly for heart health, and weights twice weekly for bone health  Aim for 7-9 hours of sleep daily

## 2017-10-06 LAB — HEPATIC FUNCTION PANEL
AG Ratio: 1.8 (calc) (ref 1.0–2.5)
ALKALINE PHOSPHATASE (APISO): 80 U/L (ref 33–130)
ALT: 10 U/L (ref 6–29)
AST: 15 U/L (ref 10–35)
Albumin: 4.2 g/dL (ref 3.6–5.1)
BILIRUBIN DIRECT: 0.1 mg/dL (ref 0.0–0.2)
BILIRUBIN INDIRECT: 0.6 mg/dL (ref 0.2–1.2)
Globulin: 2.3 g/dL (calc) (ref 1.9–3.7)
TOTAL PROTEIN: 6.5 g/dL (ref 6.1–8.1)
Total Bilirubin: 0.7 mg/dL (ref 0.2–1.2)

## 2017-10-06 LAB — CBC WITH DIFFERENTIAL/PLATELET
BASOS PCT: 1.1 %
Basophils Absolute: 57 cells/uL (ref 0–200)
EOS ABS: 99 {cells}/uL (ref 15–500)
Eosinophils Relative: 1.9 %
HCT: 36.3 % (ref 35.0–45.0)
HEMOGLOBIN: 12.6 g/dL (ref 11.7–15.5)
Lymphs Abs: 1004 cells/uL (ref 850–3900)
MCH: 30.4 pg (ref 27.0–33.0)
MCHC: 34.7 g/dL (ref 32.0–36.0)
MCV: 87.5 fL (ref 80.0–100.0)
MPV: 9.2 fL (ref 7.5–12.5)
Monocytes Relative: 8 %
NEUTROS ABS: 3624 {cells}/uL (ref 1500–7800)
Neutrophils Relative %: 69.7 %
PLATELETS: 308 10*3/uL (ref 140–400)
RBC: 4.15 10*6/uL (ref 3.80–5.10)
RDW: 12.2 % (ref 11.0–15.0)
TOTAL LYMPHOCYTE: 19.3 %
WBC: 5.2 10*3/uL (ref 3.8–10.8)
WBCMIX: 416 {cells}/uL (ref 200–950)

## 2017-10-06 LAB — BASIC METABOLIC PANEL WITH GFR
BUN / CREAT RATIO: 9 (calc) (ref 6–22)
BUN: 11 mg/dL (ref 7–25)
CHLORIDE: 103 mmol/L (ref 98–110)
CO2: 32 mmol/L (ref 20–32)
Calcium: 9.7 mg/dL (ref 8.6–10.4)
Creat: 1.25 mg/dL — ABNORMAL HIGH (ref 0.50–0.99)
GFR, EST AFRICAN AMERICAN: 52 mL/min/{1.73_m2} — AB (ref >=60)
GFR, Est Non African American: 45 mL/min/{1.73_m2} — ABNORMAL LOW (ref >=60)
GLUCOSE: 173 mg/dL — AB (ref 65–99)
POTASSIUM: 4.3 mmol/L (ref 3.5–5.3)
SODIUM: 141 mmol/L (ref 135–146)

## 2017-10-06 LAB — TSH: TSH: 2.7 mIU/L (ref 0.40–4.50)

## 2017-10-06 LAB — LIPID PANEL
CHOLESTEROL: 221 mg/dL — AB (ref <200)
HDL: 43 mg/dL — AB (ref >50)
LDL Cholesterol (Calc): 144 mg/dL (calc) — ABNORMAL HIGH
Non-HDL Cholesterol (Calc): 178 mg/dL (calc) — ABNORMAL HIGH (ref <130)
Total CHOL/HDL Ratio: 5.1 (calc) — ABNORMAL HIGH (ref <5.0)
Triglycerides: 203 mg/dL — ABNORMAL HIGH (ref <150)

## 2017-10-06 LAB — HEMOGLOBIN A1C
EAG (MMOL/L): 7.4 (calc)
Hgb A1c MFr Bld: 6.3 % of total Hgb — ABNORMAL HIGH (ref <5.7)
MEAN PLASMA GLUCOSE: 134 (calc)

## 2017-10-13 ENCOUNTER — Other Ambulatory Visit: Payer: Self-pay | Admitting: Adult Health

## 2017-10-13 DIAGNOSIS — Z1231 Encounter for screening mammogram for malignant neoplasm of breast: Secondary | ICD-10-CM

## 2017-12-13 ENCOUNTER — Encounter: Payer: Self-pay | Admitting: Internal Medicine

## 2018-01-09 ENCOUNTER — Other Ambulatory Visit: Payer: Self-pay | Admitting: *Deleted

## 2018-01-09 ENCOUNTER — Ambulatory Visit: Payer: Medicare Other | Admitting: Internal Medicine

## 2018-01-09 ENCOUNTER — Encounter: Payer: Self-pay | Admitting: Internal Medicine

## 2018-01-09 VITALS — BP 130/80 | HR 84 | Temp 97.2°F | Resp 16 | Ht 65.0 in | Wt 149.2 lb

## 2018-01-09 DIAGNOSIS — I10 Essential (primary) hypertension: Secondary | ICD-10-CM

## 2018-01-09 DIAGNOSIS — E782 Mixed hyperlipidemia: Secondary | ICD-10-CM

## 2018-01-09 DIAGNOSIS — F329 Major depressive disorder, single episode, unspecified: Secondary | ICD-10-CM

## 2018-01-09 DIAGNOSIS — Z79899 Other long term (current) drug therapy: Secondary | ICD-10-CM

## 2018-01-09 DIAGNOSIS — E559 Vitamin D deficiency, unspecified: Secondary | ICD-10-CM | POA: Diagnosis not present

## 2018-01-09 DIAGNOSIS — E1122 Type 2 diabetes mellitus with diabetic chronic kidney disease: Secondary | ICD-10-CM

## 2018-01-09 DIAGNOSIS — N183 Chronic kidney disease, stage 3 (moderate): Secondary | ICD-10-CM | POA: Diagnosis not present

## 2018-01-09 DIAGNOSIS — E039 Hypothyroidism, unspecified: Secondary | ICD-10-CM

## 2018-01-09 DIAGNOSIS — F32A Depression, unspecified: Secondary | ICD-10-CM

## 2018-01-09 MED ORDER — TRIAMCINOLONE ACETONIDE 0.5 % EX OINT: 1.0000 "application " | TOPICAL_OINTMENT | Freq: Two times a day (BID) | CUTANEOUS | 3 refills | Status: DC

## 2018-01-09 MED ORDER — DULOXETINE HCL 60 MG PO CPEP: 60.0000 mg | ORAL_CAPSULE | Freq: Every day | ORAL | 3 refills | Status: DC

## 2018-01-09 NOTE — Progress Notes (Signed)
This very nice 67 y.o. MWF presents for 6 month follow up with HTN, HLD, Pre-Diabetes and Vitamin D Deficiency.  Patient has been on [employer] Disability since 2015 for Depression & Dementia and is followed by Dr Toy Care.     Patient is treated for HTN (2010) & BP has been controlled at home. Today's BP is at goal -  130/80. Patient has had no complaints of any cardiac type chest pain, palpitations, dyspnea / orthopnea / PND, dizziness, claudication, or dependent edema.     Hyperlipidemia is not controlled with diet & patient is non-compliant with taking her Atorvastatin.  Last Lipids were not at goal: Lab Results  Component Value Date   CHOL 221 (H) 10/05/2017   HDL 43 (L) 10/05/2017   LDLCALC 144 (H) 10/05/2017   TRIG 203 (H) 10/05/2017   CHOLHDL 5.1 (H) 10/05/2017      Also, the patient has history of T2_NIDDM (2000)  and has had no symptoms of reactive hypoglycemia, diabetic polys, paresthesias or visual blurring. 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 Patient had been on Metformin which she discontinued due to uncontrollable Diarrhea.  She is attempting control with diet and alleges CBG's are less than 120 mg%. He last A1c was not at goal: Lab Results  Component Value Date   HGBA1C 6.3 (H) 10/05/2017      Further, the patient also has history of Vitamin D Deficiency ("29"/2008) and she is poorly compliant with taking recommended  Vitamin D without any suspected side-effects. Last vitamin D was not at goal:   Lab Results  Component Value Date   VD25OH 19 (L) 07/06/2017   Current Outpatient Medications on File Prior to Visit  Medication Sig  . atorvastatin (LIPITOR) 80 MG tablet TAKE ONE TABLET BY MOUTH AT BEDTIME FOR CHOLESTEROL  . Blood Glucose Monitoring Suppl (ONETOUCH VERIO) w/Device KIT 1 kit by Does not apply route as needed. Use to check glucose QD DX-E11.22  . cholecalciferol (VITAMIN D) 1000 units tablet Take 1,000 Units by mouth daily.  Marland Kitchen glucose blood test strip CHECK BLOOD SUGAR 1 TIME  DAILY-DX e11.22  . memantine (NAMENDA XR) 28 MG CP24 24 hr capsule Take 28 mg by mouth.  . Multiple Vitamin (MULTIVITAMIN) tablet Take 1 tablet by mouth daily.  . ondansetron (ZOFRAN) 8 MG tablet Take 1/2 to 1 tablet 2 to 3 x / day if needed for nausea / vomiting  . ONE TOUCH LANCETS MISC Use as directed to check glucose daily. DX-E11.22  . ranitidine (ZANTAC) 300 MG tablet Take 1 tablet 2 x / day for indigestion   No current facility-administered medications on file prior to visit.    Allergies  Allergen Reactions  . Penicillins Rash    Swelling in face - last 10 years   PMHx:   Past Medical History:  Diagnosis Date  . Dementia   . Depression   . Diabetes mellitus   . Hypertension   . Thyroid disease   . Type II or unspecified type diabetes mellitus without mention of complication, not stated as uncontrolled   . Vitamin D deficiency    Immunization History  Administered Date(s) Administered  . DT 11/25/2005, 08/11/2015  . Influenza, High Dose Seasonal PF 03/28/2017  . Pneumococcal Conjugate-13 08/04/2014  . Pneumococcal Polysaccharide-23 03/28/1999, 03/28/2017  . Zoster 04/28/2014   Past Surgical History:  Procedure Laterality Date  . FRACTURE SURGERY  2009   right arm; rod  . TOTAL KNEE ARTHROPLASTY  june 2013  left   FHx:    Reviewed / unchanged  SHx:    Reviewed / unchanged   Systems Review:  Constitutional: Denies fever, chills, wt changes, headaches, insomnia, fatigue, night sweats, change in appetite. Eyes: Denies redness, blurred vision, diplopia, discharge, itchy, watery eyes.  ENT: Denies discharge, congestion, post nasal drip, epistaxis, sore throat, earache, hearing loss, dental pain, tinnitus, vertigo, sinus pain, snoring.  CV: Denies chest pain, palpitations, irregular heartbeat, syncope, dyspnea, diaphoresis, orthopnea, PND, claudication or edema. Respiratory: denies cough, dyspnea, DOE, pleurisy, hoarseness, laryngitis, wheezing.  Gastrointestinal:  Denies dysphagia, odynophagia, heartburn, reflux, water brash, abdominal pain or cramps, nausea, vomiting, bloating, diarrhea, constipation, hematemesis, melena, hematochezia  or hemorrhoids. Genitourinary: Denies dysuria, frequency, urgency, nocturia, hesitancy, discharge, hematuria or flank pain. Musculoskeletal: Denies arthralgias, myalgias, stiffness, jt. swelling, pain, limping or strain/sprain.  Skin: Denies pruritus, rash, hives, warts, acne, eczema or change in skin lesion(s). Neuro: No weakness, tremor, incoordination, spasms, paresthesia or pain. Psychiatric: Denies confusion, memory loss or sensory loss. Endo: Denies change in weight, skin or hair change.  Heme/Lymph: No excessive bleeding, bruising or enlarged lymph nodes.  Physical Exam  BP 130/80   Pulse 84   Temp (!) 97.2 F (36.2 C)   Resp 16   Ht '5\' 5"'$  (1.651 m)   Wt 149 lb 3.2 oz (67.7 kg)   BMI 24.83 kg/m   Appears  well nourished, well groomed  and in no distress.  Eyes: PERRLA, EOMs, conjunctiva no swelling or erythema. Sinuses: No frontal/maxillary tenderness ENT/Mouth: EAC's clear, TM's nl w/o erythema, bulging. Nares clear w/o erythema, swelling, exudates. Oropharynx clear without erythema or exudates. Oral hygiene is good. Tongue normal, non obstructing. Hearing intact.  Neck: Supple. Thyroid not palpable. Car 2+/2+ without bruits, nodes or JVD. Chest: Respirations nl with BS clear & equal w/o rales, rhonchi, wheezing or stridor.  Cor: Heart sounds normal w/ regular rate and rhythm without sig. murmurs, gallops, clicks or rubs. Peripheral pulses normal and equal  without edema.  Abdomen: Soft & bowel sounds normal. Non-tender w/o guarding, rebound, hernias, masses or organomegaly.  Lymphatics: Unremarkable.  Musculoskeletal: Full ROM all peripheral extremities, joint stability, 5/5 strength and normal gait.  Skin: Warm, dry without exposed rashes, lesions or ecchymosis apparent.  Neuro: Cranial nerves intact,  reflexes equal bilaterally. Sensory-motor testing grossly intact. Tendon reflexes grossly intact.  Pysch: Alert & oriented x 3.  Insight and judgement nl & appropriate. No ideations.  Assessment and Plan:  - Continue medication, monitor blood pressure at home.  - Continue DASH diet.  Reminder to go to the ER if any CP,  SOB, nausea, dizziness, severe HA, changes vision/speech.  - Continue diet/meds, exercise,& lifestyle modifications.  - Continue monitor periodic cholesterol/liver & renal functions   1. Essential hypertension  - CBC with Differential/Platelet - COMPLETE METABOLIC PANEL WITH GFR - Magnesium - TSH  2. Hyperlipidemia, mixed  - Lipid panel - TSH  3. CKD stage 3 due to type 2 diabetes mellitus (HCC)  - Hemoglobin A1c - Insulin, random  4. Vitamin D deficiency  - Continue diet, exercise, lifestyle modifications.  - Monitor appropriate labs. - Continue supplementation.   - VITAMIN D 25 Hydroxyl  5. Hypothyroidisml  - TSH  6. Depression, controlled  - Followed by Dr Toy Care.  7. Medication management  - CBC with Differential/Platelet - COMPLETE METABOLIC PANEL WITH GFR - Magnesium - Lipid panel - TSH - Hemoglobin A1c - Insulin, random - VITAMIN D 25 Hydroxyl  Discussed  regular exercise, BP monitoring, weight control to achieve/maintain BMI less than 25 and discussed med and SE's. Recommended labs to assess and monitor clinical status with further disposition pending results of labs. Over 30 minutes of exam, counseling, chart review was performed.  

## 2018-01-09 NOTE — Patient Instructions (Signed)

## 2018-01-10 LAB — COMPLETE METABOLIC PANEL WITH GFR
AG Ratio: 2 (calc) (ref 1.0–2.5)
ALKALINE PHOSPHATASE (APISO): 62 U/L (ref 33–130)
ALT: 14 U/L (ref 6–29)
AST: 20 U/L (ref 10–35)
Albumin: 4.5 g/dL (ref 3.6–5.1)
BUN / CREAT RATIO: 10 (calc) (ref 6–22)
BUN: 13 mg/dL (ref 7–25)
CALCIUM: 10 mg/dL (ref 8.6–10.4)
CO2: 27 mmol/L (ref 20–32)
CREATININE: 1.25 mg/dL — AB (ref 0.50–0.99)
Chloride: 105 mmol/L (ref 98–110)
GFR, EST AFRICAN AMERICAN: 52 mL/min/{1.73_m2} — AB (ref >=60)
GFR, EST NON AFRICAN AMERICAN: 45 mL/min/{1.73_m2} — AB (ref >=60)
GLUCOSE: 125 mg/dL — AB (ref 65–99)
Globulin: 2.2 g/dL (calc) (ref 1.9–3.7)
Potassium: 4 mmol/L (ref 3.5–5.3)
SODIUM: 141 mmol/L (ref 135–146)
Total Bilirubin: 1.3 mg/dL — ABNORMAL HIGH (ref 0.2–1.2)
Total Protein: 6.7 g/dL (ref 6.1–8.1)

## 2018-01-10 LAB — CBC WITH DIFFERENTIAL/PLATELET
BASOS PCT: 1.1 %
Basophils Absolute: 63 cells/uL (ref 0–200)
Eosinophils Absolute: 103 cells/uL (ref 15–500)
Eosinophils Relative: 1.8 %
HCT: 38.1 % (ref 35.0–45.0)
Hemoglobin: 13 g/dL (ref 11.7–15.5)
Lymphs Abs: 1174 cells/uL (ref 850–3900)
MCH: 30.5 pg (ref 27.0–33.0)
MCHC: 34.1 g/dL (ref 32.0–36.0)
MCV: 89.4 fL (ref 80.0–100.0)
MONOS PCT: 6.5 %
MPV: 9.2 fL (ref 7.5–12.5)
Neutro Abs: 3990 cells/uL (ref 1500–7800)
Neutrophils Relative %: 70 %
PLATELETS: 268 10*3/uL (ref 140–400)
RBC: 4.26 10*6/uL (ref 3.80–5.10)
RDW: 12.5 % (ref 11.0–15.0)
TOTAL LYMPHOCYTE: 20.6 %
WBC mixed population: 371 cells/uL (ref 200–950)
WBC: 5.7 10*3/uL (ref 3.8–10.8)

## 2018-01-10 LAB — HEMOGLOBIN A1C
HEMOGLOBIN A1C: 5.9 %{Hb} — AB (ref <5.7)
Mean Plasma Glucose: 123 (calc)
eAG (mmol/L): 6.8 (calc)

## 2018-01-10 LAB — TSH: TSH: 2.67 mIU/L (ref 0.40–4.50)

## 2018-01-10 LAB — INSULIN, RANDOM: Insulin: 10.1 u[IU]/mL (ref 2.0–19.6)

## 2018-01-10 LAB — MAGNESIUM: Magnesium: 1.7 mg/dL (ref 1.5–2.5)

## 2018-01-10 LAB — LIPID PANEL
CHOL/HDL RATIO: 3.9 (calc) (ref <5.0)
CHOLESTEROL: 198 mg/dL (ref <200)
HDL: 51 mg/dL (ref >50)
LDL CHOLESTEROL (CALC): 124 mg/dL — AB
NON-HDL CHOLESTEROL (CALC): 147 mg/dL — AB (ref <130)
Triglycerides: 120 mg/dL (ref <150)

## 2018-01-10 LAB — VITAMIN D 25 HYDROXY (VIT D DEFICIENCY, FRACTURES): VIT D 25 HYDROXY: 27 ng/mL — AB (ref 30–100)

## 2018-02-01 ENCOUNTER — Ambulatory Visit
Admission: RE | Admit: 2018-02-01 | Discharge: 2018-02-01 | Disposition: A | Discharge status: Home / Self Care | Payer: Medicare Other | Source: Ambulatory Visit | Attending: Adult Health | Admitting: Adult Health

## 2018-02-01 ENCOUNTER — Other Ambulatory Visit: Payer: Self-pay | Admitting: Adult Health

## 2018-02-01 DIAGNOSIS — Z1231 Encounter for screening mammogram for malignant neoplasm of breast: Secondary | ICD-10-CM

## 2018-02-01 DIAGNOSIS — R928 Other abnormal and inconclusive findings on diagnostic imaging of breast: Secondary | ICD-10-CM

## 2018-02-01 DIAGNOSIS — N6310 Unspecified lump in the right breast, unspecified quadrant: Secondary | ICD-10-CM

## 2018-02-01 DIAGNOSIS — E2839 Other primary ovarian failure: Secondary | ICD-10-CM

## 2018-02-02 ENCOUNTER — Other Ambulatory Visit: Payer: Self-pay | Admitting: Adult Health

## 2018-02-02 DIAGNOSIS — R928 Other abnormal and inconclusive findings on diagnostic imaging of breast: Secondary | ICD-10-CM

## 2018-02-09 ENCOUNTER — Ambulatory Visit
Admission: RE | Admit: 2018-02-09 | Discharge: 2018-02-09 | Disposition: A | Discharge status: Home / Self Care | Payer: Medicare Other | Source: Ambulatory Visit | Attending: Adult Health | Admitting: Adult Health

## 2018-02-09 ENCOUNTER — Other Ambulatory Visit: Payer: Self-pay | Admitting: Adult Health

## 2018-02-09 ENCOUNTER — Encounter: Payer: Self-pay | Admitting: Adult Health

## 2018-02-09 DIAGNOSIS — N6310 Unspecified lump in the right breast, unspecified quadrant: Secondary | ICD-10-CM

## 2018-02-09 DIAGNOSIS — C50911 Malignant neoplasm of unspecified site of right female breast: Secondary | ICD-10-CM | POA: Insufficient documentation

## 2018-02-09 DIAGNOSIS — R928 Other abnormal and inconclusive findings on diagnostic imaging of breast: Secondary | ICD-10-CM

## 2018-02-13 ENCOUNTER — Encounter: Payer: Self-pay | Admitting: Adult Health

## 2018-02-13 ENCOUNTER — Telehealth: Payer: Self-pay | Admitting: Oncology

## 2018-02-13 ENCOUNTER — Encounter: Payer: Self-pay | Admitting: *Deleted

## 2018-02-13 DIAGNOSIS — Z170 Estrogen receptor positive status [ER+]: Principal | ICD-10-CM

## 2018-02-13 DIAGNOSIS — C50411 Malignant neoplasm of upper-outer quadrant of right female breast: Secondary | ICD-10-CM | POA: Insufficient documentation

## 2018-02-13 NOTE — Telephone Encounter (Signed)
Spoke to patients husband to confirm afternoon [facility] appointment for 8/28, packet will be mailed to patient

## 2018-02-20 NOTE — Progress Notes (Signed)
[place]  Telephone:([phone]) [phone] Fax:(336) [phone]     ID: Marie [name] DOB: 06/08/1951  MR#: [id number]  EFE#:[id number]  Patient Care Team: [name], MD as PCP - General (Internal Medicine) Chucky May, MD as Consulting Physician (Psychiatry) Marcellis Frampton, Virgie Dad, MD as Consulting Physician (Oncology) [name], MD as Consulting Physician (General Surgery) Gery Pray, MD as Consulting Physician (Radiation Oncology) OTHER MD:  CHIEF COMPLAINT: Estrogen receptor positive breast cancer  CURRENT TREATMENT: Awaiting definitive surgery   HISTORY OF CURRENT ILLNESS: Marie [name] had routine screening mammography on 02/01/2018 showing a possible abnormality in the right breast. She underwent unilateral right diagnostic mammography with tomography and right breast ultrasonography at [facility] on 02/09/2018 showing: breast density category C. There was a suspicious mass at the 1 o'clock upper inner quadrant of the right breast measuring 1.0 x 0.6 x 1.0 cm. Additionally there is a suspicious nodule at the 10 o'clock upper outer quadrant measuring 0.6 cm. There is no sonographic evidence of right axillary lymphadenopathy.  Accordingly on 02/09/2018 she proceeded to biopsy of the right breast area in question. The pathology from this procedure showed ([id number]) : At the 1 o' clock position there was benign breast tissue with fibrocystic change.  This was felt to be concordant by radiology.  At the 10 o'clock: Invasive ductal carcinoma, grade 2. Prognostic indicators significant for: estrogen receptor, 100% positive and progesterone receptor, 80% positive, both with strong staining intensity. Proliferation marker Ki67 at 10%. HER2 negative by IHC (0) .  The patient's subsequent history is as detailed below.  INTERVAL HISTORY: Marie [name] was evaluated in the multidisciplinary breast cancer clinic on 02/20/2018 accompanied by her husband. Her case was  also presented at the multidisciplinary breast cancer conference on the same day. At that time a preliminary plan was proposed: Breast conserving surgery with sentinel lymph node sampling followed by adjuvant radiation and antiestrogens.  Oncotype also was discussed.   REVIEW OF SYSTEMS: There were no specific symptoms leading to the original mammogram, which was routinely scheduled. The patient denies unusual headaches, visual changes, nausea, vomiting, stiff neck, dizziness, or gait imbalance. There has been no cough, phlegm production, or pleurisy, no chest pain or pressure, and no change in bowel or bladder habits. The patient denies fever, rash, bleeding, unexplained fatigue or unexplained weight loss. A detailed review of systems was otherwise entirely negative.   PAST MEDICAL HISTORY: Past Medical History:  Diagnosis Date  . Dementia   . Depression   . Diabetes mellitus   . Hypertension   . Thyroid disease   . Type II or unspecified type diabetes mellitus without mention of complication, not stated as uncontrolled   . Vitamin D deficiency   Diabetes controlled with diet (previously not tolerated metformin due to diarrhea)  PAST SURGICAL HISTORY: Past Surgical History:  Procedure Laterality Date  . FRACTURE SURGERY  2009   right arm; rod  . TOTAL KNEE ARTHROPLASTY  june 2013   left  Tonsillectomy  FAMILY HISTORY Family History  Problem Relation Age of Onset  . Colon cancer Neg Hx   . Stomach cancer Neg Hx   The patient's father died at age 62 due to dementia and kidney failure. The patient's mother died at age 25 due to emphysema. The patient had 1 brother and no sisters. The patient's mother was diagnosed with breast cancer in the 55's. She denies any other breast cancer history or any history of ovarian or prostate cancer in the  family.   GYNECOLOGIC HISTORY:  No LMP recorded. Patient is postmenopausal. Menarche: 67 years old Age at first live birth: 67 years old She is  GXP2.  Her LMP was in 1975. She did not use contraception or HRT.    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 Her husband [name], " used to be a Pension scheme manager. The patient's daughter Marie [name] lives in [place] and is disabled due to a car accident and back issues. The patient's son, Marie [name] lives in [place] as a gas truck driver. The patient has 5 grandchildren and 1 great grandchild. She is not currently a church at tender     ADVANCED DIRECTIVES: The patient's husband is her 86.    HEALTH MAINTENANCE: Social History   Tobacco Use  . Smoking status: Never Smoker  . Smokeless tobacco: Never Used  Substance Use Topics  . Alcohol use: No  . Drug use: No     Colonoscopy: October 2013, Erskine Emery  PAP:  Bone density: 02/01/2018 showed a T score of -1.5 (osteopenia)   Allergies  Allergen Reactions  . Penicillins Rash    Swelling in face - last 10 years    Current Outpatient Medications  Medication Sig Dispense Refill  . cetirizine (ZYRTEC) 10 MG tablet Take 10 mg by mouth at bedtime.    . cholecalciferol (VITAMIN D) 1000 units tablet Take 1,000 Units by mouth daily.    . DULoxetine (CYMBALTA) 60 MG capsule Take 1 capsule (60 mg total) by mouth daily. 90 capsule 3  . memantine (NAMENDA XR) 28 MG CP24 24 hr capsule Take 28 mg by mouth.    . pravastatin (PRAVACHOL) 80 MG tablet Take 80 mg by mouth at bedtime.    Marland Kitchen atorvastatin (LIPITOR) 80 MG tablet TAKE ONE TABLET BY MOUTH AT BEDTIME FOR CHOLESTEROL 90 tablet 0  . Blood Glucose Monitoring Suppl (ONETOUCH VERIO) w/Device KIT 1 kit by Does not apply route as needed. Use to check glucose QD DX-E11.22 1 kit 0  . glucose blood test strip CHECK BLOOD SUGAR 1 TIME DAILY-DX e11.22 100 each 12  . Multiple Vitamin (MULTIVITAMIN) tablet Take 1 tablet by mouth daily.    . ondansetron (ZOFRAN) 8 MG tablet Take 1/2 to 1 tablet 2 to 3 x / day if needed for nausea / vomiting (Patient not taking: Reported on  02/21/2018) 30 tablet 2  . ONE TOUCH LANCETS MISC Use as directed to check glucose daily. DX-E11.22 100 each 1  . ranitidine (ZANTAC) 300 MG tablet Take 1 tablet 2 x / day for indigestion (Patient not taking: Reported on 02/21/2018) 60 tablet 2  . triamcinolone ointment (KENALOG) 0.5 % Apply 1 application topically 2 (two) times daily. Apply sparingly 2-3  x/ day (Do not use on face) (Patient not taking: Reported on 02/21/2018) 30 g 3   No current facility-administered medications for this visit.     OBJECTIVE: Middle-aged white woman who appears well  Vitals:   02/21/18 1253  BP: (!) 145/64  Pulse: 88  Resp: 18  Temp: 97.7 F (36.5 C)  SpO2: 100%     Body mass index is 24.45 kg/m.   Wt Readings from Last 3 Encounters:  02/21/18 146 lb 14.4 oz (66.6 kg)  01/09/18 149 lb 3.2 oz (67.7 kg)  10/05/17 153 lb (69.4 kg)      ECOG FS:0 - Asymptomatic  Ocular: Sclerae unicteric, pupils round and equal Lymphatic: No cervical or supraclavicular adenopathy Lungs no rales or rhonchi  Heart regular rate and rhythm Abd soft, nontender, positive bowel sounds MSK no focal spinal tenderness, no joint edema Neuro: non-focal, very alert and conversant, mild memory deficits on which she relies on her husband for information, pleasant and cooperative affect Breasts: Status post recent right breast biopsy, with a small ecchymosis present.  There are no skin or nipple changes of concern.  Both axillae are benign.   LAB RESULTS:  CMP     Component Value Date/Time   NA 143 02/21/2018 1117   K 3.7 02/21/2018 1117   CL 106 02/21/2018 1117   CO2 29 02/21/2018 1117   GLUCOSE 196 (H) 02/21/2018 1117   BUN 15 02/21/2018 1117   CREATININE 1.25 (H) 02/21/2018 1117   CREATININE 1.25 (H) 01/09/2018 1429   CALCIUM 9.5 02/21/2018 1117   PROT 6.5 02/21/2018 1117   ALBUMIN 3.6 02/21/2018 1117   AST 20 02/21/2018 1117   ALT 17 02/21/2018 1117   ALKPHOS 73 02/21/2018 1117   BILITOT 0.9 02/21/2018 1117    GFRNONAA 44 (L) 02/21/2018 1117   GFRNONAA 45 (L) 01/09/2018 1429   GFRAA 51 (L) 02/21/2018 1117   GFRAA 52 (L) 01/09/2018 1429    No results found for: TOTALPROTELP, ALBUMINELP, A1GS, A2GS, BETS, BETA2SER, GAMS, MSPIKE, SPEI  No results found for: KPAFRELGTCHN, LAMBDASER, KAPLAMBRATIO  Lab Results  Component Value Date   WBC 6.8 02/21/2018   NEUTROABS 4.8 02/21/2018   HGB 12.5 02/21/2018   HCT 36.2 02/21/2018   MCV 89.7 02/21/2018   PLT 279 02/21/2018    @LASTCHEMISTRY @  No results found for: LABCA2  No components found for: [id number]  No results for input(s): INR in the last 168 hours.  No results found for: LABCA2  No results found for: OTR711  No results found for: AFB903  No results found for: YBF383  No results found for: CA2729  No components found for: HGQUANT  No results found for: CEA1 / No results found for: CEA1   No results found for: AFPTUMOR  No results found for: [place]  No results found for: PSA1  Appointment on 02/21/2018  Component Date Value Ref Range Status  . Sodium 02/21/2018 143  135 - 145 mmol/L Final  . Potassium 02/21/2018 3.7  3.5 - 5.1 mmol/L Final  . Chloride 02/21/2018 106  98 - 111 mmol/L Final  . CO2 02/21/2018 29  22 - 32 mmol/L Final  . Glucose, Bld 02/21/2018 196* 70 - 99 mg/dL Final  . BUN 02/21/2018 15  8 - 23 mg/dL Final  . Creatinine 02/21/2018 1.25* 0.44 - 1.00 mg/dL Final  . Calcium 02/21/2018 9.5  8.9 - 10.3 mg/dL Final  . Total Protein 02/21/2018 6.5  6.5 - 8.1 g/dL Final  . Albumin 02/21/2018 3.6  3.5 - 5.0 g/dL Final  . AST 02/21/2018 20  15 - 41 U/L Final  . ALT 02/21/2018 17  0 - 44 U/L Final  . Alkaline Phosphatase 02/21/2018 73  38 - 126 U/L Final  . Total Bilirubin 02/21/2018 0.9  0.3 - 1.2 mg/dL Final  . GFR, Est Non Af Am 02/21/2018 44* >60 mL/min Final  . GFR, Est AFR Am 02/21/2018 51* >60 mL/min Final   Comment: (NOTE) The eGFR has been calculated using the CKD EPI equation. This calculation  has not been validated in all clinical situations. eGFR's persistently <60 mL/min signify possible Chronic Kidney Disease.   . Anion gap 02/21/2018 8  5 - 15 Final   Performed at [facility], [place] [address], [place], [place] [address]  . WBC Count 02/21/2018 6.8  3.9 - 10.3 K/uL Final  . RBC 02/21/2018 4.04  3.70 - 5.45 MIL/uL Final  . Hemoglobin 02/21/2018 12.5  11.6 - 15.9 g/dL Final  . HCT 02/21/2018 36.2  34.8 - 46.6 % Final  . MCV 02/21/2018 89.7  79.5 - 101.0 fL Final  . MCH 02/21/2018 31.0  25.1 - 34.0 pg Final  . MCHC 02/21/2018 34.6  31.5 - 36.0 g/dL Final  . RDW 02/21/2018 13.0  11.2 - 14.5 % Final  . Platelet Count 02/21/2018 279  145 - 400 K/uL Final  . Neutrophils Relative % 02/21/2018 70  % Final  . Neutro Abs 02/21/2018 4.8  1.5 - 6.5 K/uL Final  . Lymphocytes Relative 02/21/2018 20  % Final  . Lymphs Abs 02/21/2018 1.3  0.9 - 3.3 K/uL Final  . Monocytes Relative 02/21/2018 7  % Final  . Monocytes Absolute 02/21/2018 0.4  0.1 - 0.9 K/uL Final  . Eosinophils Relative 02/21/2018 2  % Final  . Eosinophils Absolute 02/21/2018 0.1  0.0 - 0.5 K/uL Final  . Basophils Relative 02/21/2018 1  % Final  . Basophils Absolute 02/21/2018 0.1  0.0 - 0.1 K/uL Final   Performed at [facility], [address] [place], [place] [address]    (this displays the last labs from the last 3 days)  No results found for: TOTALPROTELP, ALBUMINELP, A1GS, A2GS, BETS, BETA2SER, GAMS, MSPIKE, SPEI (this displays SPEP labs)  No results found for: KPAFRELGTCHN, LAMBDASER, KAPLAMBRATIO (kappa/lambda light chains)  No results found for: HGBA, HGBA2QUANT, HGBFQUANT, HGBSQUAN (Hemoglobinopathy evaluation)   No results found for: LDH  No results found for: IRON, TIBC, IRONPCTSAT (Iron and TIBC)  No results found for: FERRITIN  Urinalysis    Component Value Date/Time   COLORURINE YELLOW 07/06/2017 [place] 07/06/2017 1514    LABSPEC 1.007 07/06/2017 1514   PHURINE 6.0 07/06/2017 1514   GLUCOSEU NEGATIVE 07/06/2017 [place] 07/06/2017 [place] 06/08/2016 1459   KETONESUR NEGATIVE 07/06/2017 1514   PROTEINUR NEGATIVE 07/06/2017 1514   UROBILINOGEN 0.2 07/16/2014 1212   NITRITE NEGATIVE 07/06/2017 1514   LEUKOCYTESUR NEGATIVE 07/06/2017 1514     STUDIES: Dg Bone Density  Result Date: 02/01/2018 EXAM: DUAL X-RAY ABSORPTIOMETRY (DXA) FOR BONE MINERAL DENSITY IMPRESSION: Referring Physician:  Liane Comber Your patient completed a BMD test using Lunar IDXA DXA system ( analysis version: 16 ) manufactured by [employer]. PATIENT: Name: Marie [name], Marie [name] Patient ID: [id number] Birth Date: [birth year] Height: 64.0 in. Sex: Female Measured: 02/01/2018 Weight: 141.6 lbs. Indications: Caucasian, Estrogen Deficient, History of Fracture (Adult) (V15.51), Postmenopausal Fractures: Foot Treatments: Calcium (E943.0), Vitamin D (E933.5) ASSESSMENT: The BMD measured at Femur Neck Left is 0.823 g/cm2 with a T-score of -1.5. This patient is considered osteopenic according to [facility]) criteria. The scan quality is good. L-3, L-4 were excluded due to degenerative changes. Site Region Measured Date Measured Age YA BMD Significant CHANGE T-score DualFemur Neck Left  02/01/2018    66.2         -1.5    0.823 g/cm2 AP Spine  L1-L2      02/01/2018    66.2         -0.8    1.066 g/cm2 DualFemur Total Mean 3/01/2018    66.2         -1.2  0.861 g/cm2 World Health Organization [facility]) criteria for post-menopausal, Caucasian Women: Normal       T-score at or above -1 SD Osteopenia   T-score between -1 and -2.5 SD Osteoporosis T-score at or below -2.5 SD RECOMMENDATION: 1. All patients should optimize calcium and vitamin D intake. 2. Consider FDA approved medical therapies in postmenopausal women and men aged 69 years and older, based on the following: a. A hip or vertebral (clinical or morphometric)  fracture b. T- score < or = -2.5 at the femoral neck or spine after appropriate evaluation to exclude secondary causes c. Low bone mass (T-score between -1.0 and -2.5 at the femoral neck or spine) and a 10 year probability of a hip fracture > or = 3% or a 10 year probability of a major osteoporosis-related fracture > or = 20% based on the [place]-adapted WHO algorithm d. Clinician judgment and/or patient preferences may indicate treatment for people with 10-year fracture probabilities above or below these levels FOLLOW-UP: People with diagnosed cases of osteoporosis or at high risk for fracture should have regular bone mineral density tests. For patients eligible for Medicare, routine testing is allowed once every 2 years. The testing frequency can be increased to one year for patients who have rapidly progressing disease, those who are receiving or discontinuing medical therapy to restore bone mass, or have additional risk factors. I have reviewed this report and agree with the above findings. [facility] FRAX* 10-year Probability of Fracture Based on femoral neck BMD: DualFemur (Left) Major Osteoporotic Fracture: 15.2% Hip Fracture:                1.8% Population:                  [place] (Caucasian) Risk Factors:                History of Fracture (Adult) (V15.51) *FRAX is a Materials engineer of the [employer] for Metabolic Bone Disease, a World Pharmacologist (WHO) [facility]. ASSESSMENT: The probability of a major osteoporotic fracture is 15.2 % within the next ten years. The probability of hip fracture is  1.8  % within the next 10 years. Electronically Signed   By: Marijo Conception, M.D.   On: 02/01/2018 13:26   US Breast Ltd Uni Right Inc Axilla  Result Date: 02/09/2018 CLINICAL DATA:  Right inner breast possible mass seen on most recent screening mammography. EXAM: DIGITAL DIAGNOSTIC RIGHT MAMMOGRAM WITH CAD AND TOMO ULTRASOUND RIGHT BREAST COMPARISON:   Previous exam(s). ACR Breast Density Category c: The breast tissue is heterogeneously dense, which may obscure small masses. FINDINGS: Additional mammographic views of the right breast demonstrate persistent circumscribed slightly over 1 cm in size mass in the right upper inner breast, middle to posterior depth. Additionally, there is a spiculated 6 mm nodule in the superior right breast, far posterior depth. Mammographic images were processed with CAD. On physical exam, no suspicious masses are palpated. Targeted ultrasound is performed, showing right breast 1 o'clock 8 cm from the nipple hypoechoic circumscribed mixed echogenicity mass, which measures 1.0 x 0.6 x 1.0 cm. This finding corresponds to the mammographically seen circumscribed mass in the upper inner right breast. Additionally, there is a right breast 10 o'clock, 14 cm from the nipple suspicious hypoechoic taller than wide nodule, which corresponds to the 6 mm spiculated mass seen mammographically in the superior right breast. It measures 0.5 x 0.6 x 0.4 cm sonographically. There is no evidence  of right axillary lymphadenopathy. IMPRESSION: Right breast 10 o'clock suspicious nodule, for which ultrasound-guided core needle biopsy is recommended. Right breast 1 o'clock indeterminate mass, for which ultrasound-guided core needle biopsy is recommended. Attention on postprocedural mammogram to establish mammographic/sonographic correlation is recommended. No evidence of right axillary lymphadenopathy. RECOMMENDATION: Ultrasound-guided core needle biopsy of the right breast 10 o'clock nodule and right breast 1 o'clock mass. I have discussed the findings and recommendations with the patient. Results were also provided in writing at the conclusion of the visit. If applicable, a reminder letter will be sent to the patient regarding the next appointment. BI-RADS CATEGORY  4: Suspicious. Electronically Signed   By: Fidela Salisbury M.D.   On: 02/09/2018 11:24     Mm Diag Breast Tomo Uni Right  Result Date: 02/09/2018 CLINICAL DATA:  Right inner breast possible mass seen on most recent screening mammography. EXAM: DIGITAL DIAGNOSTIC RIGHT MAMMOGRAM WITH CAD AND TOMO ULTRASOUND RIGHT BREAST COMPARISON:  Previous exam(s). ACR Breast Density Category c: The breast tissue is heterogeneously dense, which may obscure small masses. FINDINGS: Additional mammographic views of the right breast demonstrate persistent circumscribed slightly over 1 cm in size mass in the right upper inner breast, middle to posterior depth. Additionally, there is a spiculated 6 mm nodule in the superior right breast, far posterior depth. Mammographic images were processed with CAD. On physical exam, no suspicious masses are palpated. Targeted ultrasound is performed, showing right breast 1 o'clock 8 cm from the nipple hypoechoic circumscribed mixed echogenicity mass, which measures 1.0 x 0.6 x 1.0 cm. This finding corresponds to the mammographically seen circumscribed mass in the upper inner right breast. Additionally, there is a right breast 10 o'clock, 14 cm from the nipple suspicious hypoechoic taller than wide nodule, which corresponds to the 6 mm spiculated mass seen mammographically in the superior right breast. It measures 0.5 x 0.6 x 0.4 cm sonographically. There is no evidence of right axillary lymphadenopathy. IMPRESSION: Right breast 10 o'clock suspicious nodule, for which ultrasound-guided core needle biopsy is recommended. Right breast 1 o'clock indeterminate mass, for which ultrasound-guided core needle biopsy is recommended. Attention on postprocedural mammogram to establish mammographic/sonographic correlation is recommended. No evidence of right axillary lymphadenopathy. RECOMMENDATION: Ultrasound-guided core needle biopsy of the right breast 10 o'clock nodule and right breast 1 o'clock mass. I have discussed the findings and recommendations with the patient. Results were also  provided in writing at the conclusion of the visit. If applicable, a reminder letter will be sent to the patient regarding the next appointment. BI-RADS CATEGORY  4: Suspicious. Electronically Signed   By: Fidela Salisbury M.D.   On: 02/09/2018 11:24   Mm 3d Screen Breast Bilateral  Result Date: 02/01/2018 CLINICAL DATA:  Screening. EXAM: DIGITAL SCREENING BILATERAL MAMMOGRAM WITH TOMO AND CAD COMPARISON:  None ACR Breast Density Category c: The breast tissue is heterogeneously dense, which may obscure small masses. FINDINGS: In the right breast, a possible mass warrants further evaluation. In the left breast, no findings suspicious for malignancy. Images were processed with CAD. IMPRESSION: Further evaluation is suggested for possible mass in the right breast. RECOMMENDATION: Diagnostic mammogram and possibly ultrasound of the right breast. (Code:[id number]) The patient will be contacted regarding the findings, and additional imaging will be scheduled. BI-RADS CATEGORY  0: Incomplete. Need additional imaging evaluation and/or prior mammograms for comparison. Electronically Signed   By: Lajean Manes M.D.   On: 02/01/2018 12:25   Mm Clip Placement Right  Result Date: 02/09/2018 CLINICAL DATA:  Status post ultrasound-guided core needle biopsy of 2 masses right breast. EXAM: DIAGNOSTIC RIGHT MAMMOGRAM POST ULTRASOUND BIOPSY COMPARISON:  Previous exam(s). FINDINGS: Mammographic images were obtained following ultrasound guided biopsy of 2 right breast masses. The ribbon shaped biopsy clip, seen on the lateral medial view, lies within the far upper-outer quadrant mass and the coil shaped biopsy clip lies along the posterior margin of the upper inner quadrant mass. IMPRESSION: Well-positioned biopsy clips following ultrasound-guided core needle biopsy of 2 right breast masses. Final Assessment: Post Procedure Mammograms for Marker Placement Electronically Signed   By: Lajean Manes M.D.   On: 02/09/2018 14:52    [place] Rt Breast Bx W Loc Dev 1st Lesion Img Bx Spec US Guide  Addendum Date: 02/13/2018   ADDENDUM REPORT: 02/13/2018 08:59 ADDENDUM: Pathology revealed GRADE 2 INVASIVE DUCTAL CARCINOMA of RIGHT breast, upper outer quadrant, 10 o'clock. This was found to be concordant by Dr. Lajean Manes. Pathology revealed BENIGN BREAST TISSUE WITH FIBROCYSTIC CHANGE is RIGHT breast, upper, inner quadrant, 1 o'clock. This was found to be concordant by Dr. Lajean Manes. Pathology results were discussed with the patient's husband by telephone per request. The patient reported doing well after the biopsy with tenderness at the site. Post biopsy instructions and care were reviewed and questions were answered. The patient's husband was encouraged to call [facility] for any additional concerns. The patient was referred to [facility] on February 21, 2018. Pathology results reported by Roselind Messier, RN on 02/13/2018. Electronically Signed   By: Lajean Manes M.D.   On: 02/13/2018 08:59   Result Date: 02/13/2018 CLINICAL DATA:  Patient presents for ultrasound-guided core needle biopsy of 2 masses in the right breast, the smaller more suspicious 10 o'clock position, 14 cm the nipple, and the other in the 1 o'clock position, 8 cm the nipple. EXAM: ULTRASOUND GUIDED RIGHT BREAST CORE NEEDLE BIOPSY: 2 RIGHT BREAST MASSES BIOPSIED. COMPARISON:  Previous exam(s). FINDINGS: I met with the patient and we discussed the procedure of ultrasound-guided biopsy, including benefits and alternatives. We discussed the high likelihood of a successful procedure. We discussed the risks of the procedure, including infection, bleeding, tissue injury, clip migration, and inadequate sampling. Informed written consent was given. The usual time-out protocol was performed immediately prior to the procedure. Lesion #1 quadrant: Upper outer quadrant Using sterile  technique and 1% Lidocaine as local anesthetic, under direct ultrasound visualization, a 12 gauge spring-loaded device was used to perform biopsy of a small mass in the 10 o'clock position of the right breast using an inferior, lateral approach. At the conclusion of the procedure a ribbon shaped tissue marker clip was deployed into the biopsy cavity. Lesion #2 quadrant: Upper inner quadrant Using sterile technique and 1% Lidocaine as local anesthetic, under direct ultrasound visualization, a 12 gauge spring-loaded device was used to perform biopsy of the 1 o'clock position right breast mass using an inferior, medial approach. At the conclusion of the procedure a coil shaped tissue marker clip was deployed into the biopsy cavity. Follow up 2 view mammogram was performed and dictated separately. IMPRESSION: Ultrasound guided biopsy of 2 right breast masses. No apparent complications. Electronically Signed: By: Lajean Manes M.D. On: 02/09/2018 14:39   [place] Rt Breast Bx W Loc Dev Ea Add Lesion Img Bx Spec US Guide  Addendum Date: 02/13/2018   ADDENDUM REPORT: 02/13/2018 08:59 ADDENDUM: Pathology revealed GRADE 2 INVASIVE DUCTAL CARCINOMA of RIGHT  breast, upper outer quadrant, 10 o'clock. This was found to be concordant by Dr. Lajean Manes. Pathology revealed BENIGN BREAST TISSUE WITH FIBROCYSTIC CHANGE is RIGHT breast, upper, inner quadrant, 1 o'clock. This was found to be concordant by Dr. Lajean Manes. Pathology results were discussed with the patient's husband by telephone per request. The patient reported doing well after the biopsy with tenderness at the site. Post biopsy instructions and care were reviewed and questions were answered. The patient's husband was encouraged to call [facility] for any additional concerns. The patient was referred to [facility] at [facility] on February 21, 2018. Pathology results reported by  Roselind Messier, RN on 02/13/2018. Electronically Signed   By: Lajean Manes M.D.   On: 02/13/2018 08:59   Result Date: 02/13/2018 CLINICAL DATA:  Patient presents for ultrasound-guided core needle biopsy of 2 masses in the right breast, the smaller more suspicious 10 o'clock position, 14 cm the nipple, and the other in the 1 o'clock position, 8 cm the nipple. EXAM: ULTRASOUND GUIDED RIGHT BREAST CORE NEEDLE BIOPSY: 2 RIGHT BREAST MASSES BIOPSIED. COMPARISON:  Previous exam(s). FINDINGS: I met with the patient and we discussed the procedure of ultrasound-guided biopsy, including benefits and alternatives. We discussed the high likelihood of a successful procedure. We discussed the risks of the procedure, including infection, bleeding, tissue injury, clip migration, and inadequate sampling. Informed written consent was given. The usual time-out protocol was performed immediately prior to the procedure. Lesion #1 quadrant: Upper outer quadrant Using sterile technique and 1% Lidocaine as local anesthetic, under direct ultrasound visualization, a 12 gauge spring-loaded device was used to perform biopsy of a small mass in the 10 o'clock position of the right breast using an inferior, lateral approach. At the conclusion of the procedure a ribbon shaped tissue marker clip was deployed into the biopsy cavity. Lesion #2 quadrant: Upper inner quadrant Using sterile technique and 1% Lidocaine as local anesthetic, under direct ultrasound visualization, a 12 gauge spring-loaded device was used to perform biopsy of the 1 o'clock position right breast mass using an inferior, medial approach. At the conclusion of the procedure a coil shaped tissue marker clip was deployed into the biopsy cavity. Follow up 2 view mammogram was performed and dictated separately. IMPRESSION: Ultrasound guided biopsy of 2 right breast masses. No apparent complications. Electronically Signed: By: Lajean Manes M.D. On: 02/09/2018 14:39    ELIGIBLE FOR  AVAILABLE RESEARCH PROTOCOL:  no ASSESSMENT: 67 y.o. [place], [place] woman status post right breast upper outer quadrant biopsy 02/09/2018 for a clinical T1b N0, stage IA invasive ductal carcinoma, grade 2, estrogen and progesterone receptor positive, HER-2 not amplified, with an MIB-1 of 10%  (1) definitive surgery pending  (2) no Oncotype planned  (3) adjuvant radiation  (4) antiestrogens to follow at the completion of local treatment.  (a) Bone density: 02/01/2018 showed a T score of -1.5 (osteopenia)  PLAN: I spent approximately 60 minutes face to face with Rees with more than 50% of that time spent in counseling and coordination of care. Specifically we reviewed the biology of the patient's diagnosis and the specifics of her situation. We first reviewed the fact that cancer is not one disease but more than 100 different diseases and that it is important to keep them separate-- otherwise when friends and relatives discuss their own cancer experiences with Dondi confusion can result. Similarly we explained that if breast cancer spreads to the bone  or liver, the patient would not have bone cancer or liver cancer, but breast cancer in the bone and breast cancer in the liver: one cancer in three places-- not 3 different cancers which otherwise would have to be treated in 3 different ways.  We discussed the difference between local and systemic therapy. In terms of loco-regional treatment, lumpectomy plus radiation is equivalent to mastectomy as far as survival is concerned. For this reason, and because the cosmetic results are generally superior, we recommend breast conserving surgery followed by radiation  We then discussed the rationale for systemic therapy. There is some risk that this cancer may have already spread to other parts of her body. Patients frequently ask at this point about bone scans, CAT scans and PET scans to find out if they have occult breast cancer somewhere else. The  problem is that in early stage disease we are much more likely to find false positives then true cancers and this would expose the patient to unnecessary procedures as well as unnecessary radiation. Scans cannot answer the question the patient really would like to know, which is whether she has microscopic disease elsewhere in her body. For those reasons we do not recommend them.  Of course we would proceed to aggressive evaluation of any symptoms that might suggest metastatic disease, but that is not the case here.  Next we went over the options for systemic therapy which are anti-estrogens, anti-HER-2 immunotherapy, and chemotherapy. Lorianne does not meet criteria for anti-HER-2 immunotherapy. She is a good candidate for anti-estrogens.  The question of chemotherapy is more complicated. Chemotherapy is most effective in rapidly growing, aggressive tumors. It is much less effective in not high-grade, slow growing cancers, like Florice 's.  We discussed competing morbidities including her renal issues, mild dementia, and diabetes.  Also, considering the fact that her tumor measures at 0.6 cm, and that NCCN guidelines do not recommend chemotherapy for breast cancers 0.5 cm or less, we are going to forego Oncotype testing in this case,, as her prognosis without chemotherapy is likely to be very good and the benefit if any from chemotherapy is likely to be very small.  The overall plan then is to start with surgery, to be followed by adjuvant radiation, and then antiestrogens.  I will plan to see [place] in about 2 to 3 months to discuss antiestrogen options.  Marymargaret has a good understanding of the overall plan. She agrees with it. She knows the goal of treatment in her case is cure. She will call with any problems that may develop before her next visit here.   Shaune Malacara, Virgie Dad, MD  02/21/18 1:28 PM Medical Oncology and Hematology [facility] [address] [place], [place]  [address] Tel. [phone]    Fax. [phone]  Alice Rieger, am acting as scribe for [name] Cruel MD.  I, Lurline Del MD, have reviewed the above documentation for accuracy and completeness, and I agree with the above.

## 2018-02-21 ENCOUNTER — Ambulatory Visit: Payer: Self-pay | Admitting: General Surgery

## 2018-02-21 ENCOUNTER — Inpatient Hospital Stay: Payer: Medicare Other

## 2018-02-21 ENCOUNTER — Other Ambulatory Visit: Payer: Self-pay

## 2018-02-21 ENCOUNTER — Inpatient Hospital Stay: Payer: Medicare Other | Attending: Oncology | Admitting: Oncology

## 2018-02-21 ENCOUNTER — Ambulatory Visit
Admission: RE | Admit: 2018-02-21 | Discharge: 2018-02-21 | Disposition: A | Discharge status: Home / Self Care | Payer: Medicare Other | Source: Ambulatory Visit | Attending: Radiation Oncology | Admitting: Radiation Oncology

## 2018-02-21 ENCOUNTER — Encounter: Payer: Self-pay | Admitting: Oncology

## 2018-02-21 ENCOUNTER — Ambulatory Visit: Payer: Medicare Other | Attending: General Surgery | Admitting: Physical Therapy

## 2018-02-21 ENCOUNTER — Encounter: Payer: Self-pay | Admitting: Physical Therapy

## 2018-02-21 VITALS — BP 145/64 | HR 88 | Temp 97.7°F | Resp 18 | Ht 65.0 in | Wt 146.9 lb

## 2018-02-21 DIAGNOSIS — Z170 Estrogen receptor positive status [ER+]: Principal | ICD-10-CM

## 2018-02-21 DIAGNOSIS — C50411 Malignant neoplasm of upper-outer quadrant of right female breast: Secondary | ICD-10-CM

## 2018-02-21 DIAGNOSIS — E079 Disorder of thyroid, unspecified: Secondary | ICD-10-CM | POA: Insufficient documentation

## 2018-02-21 DIAGNOSIS — Z79899 Other long term (current) drug therapy: Secondary | ICD-10-CM

## 2018-02-21 DIAGNOSIS — R293 Abnormal posture: Secondary | ICD-10-CM | POA: Insufficient documentation

## 2018-02-21 DIAGNOSIS — M8580 Other specified disorders of bone density and structure, unspecified site: Secondary | ICD-10-CM | POA: Diagnosis not present

## 2018-02-21 DIAGNOSIS — N183 Chronic kidney disease, stage 3 (moderate): Secondary | ICD-10-CM | POA: Diagnosis not present

## 2018-02-21 DIAGNOSIS — F0390 Unspecified dementia without behavioral disturbance: Secondary | ICD-10-CM | POA: Diagnosis not present

## 2018-02-21 DIAGNOSIS — E559 Vitamin D deficiency, unspecified: Secondary | ICD-10-CM | POA: Diagnosis not present

## 2018-02-21 DIAGNOSIS — F329 Major depressive disorder, single episode, unspecified: Secondary | ICD-10-CM | POA: Insufficient documentation

## 2018-02-21 DIAGNOSIS — E1122 Type 2 diabetes mellitus with diabetic chronic kidney disease: Secondary | ICD-10-CM | POA: Diagnosis not present

## 2018-02-21 DIAGNOSIS — I129 Hypertensive chronic kidney disease with stage 1 through stage 4 chronic kidney disease, or unspecified chronic kidney disease: Secondary | ICD-10-CM | POA: Diagnosis not present

## 2018-02-21 DIAGNOSIS — E119 Type 2 diabetes mellitus without complications: Secondary | ICD-10-CM

## 2018-02-21 LAB — CMP (CANCER CENTER ONLY)
ALBUMIN: 3.6 g/dL (ref 3.5–5.0)
ALT: 17 U/L (ref 0–44)
ANION GAP: 8 (ref 5–15)
AST: 20 U/L (ref 15–41)
Alkaline Phosphatase: 73 U/L (ref 38–126)
BUN: 15 mg/dL (ref 8–23)
CALCIUM: 9.5 mg/dL (ref 8.9–10.3)
CHLORIDE: 106 mmol/L (ref 98–111)
CO2: 29 mmol/L (ref 22–32)
Creatinine: 1.25 mg/dL — ABNORMAL HIGH (ref 0.44–1.00)
GFR, Est AFR Am: 51 mL/min — ABNORMAL LOW (ref >60)
GFR, Estimated: 44 mL/min — ABNORMAL LOW (ref >60)
GLUCOSE: 196 mg/dL — AB (ref 70–99)
Potassium: 3.7 mmol/L (ref 3.5–5.1)
SODIUM: 143 mmol/L (ref 135–145)
Total Bilirubin: 0.9 mg/dL (ref 0.3–1.2)
Total Protein: 6.5 g/dL (ref 6.5–8.1)

## 2018-02-21 LAB — CBC WITH DIFFERENTIAL (CANCER CENTER ONLY)
BASOS PCT: 1 %
Basophils Absolute: 0.1 10*3/uL (ref 0.0–0.1)
Eosinophils Absolute: 0.1 10*3/uL (ref 0.0–0.5)
Eosinophils Relative: 2 %
HEMATOCRIT: 36.2 % (ref 34.8–46.6)
HEMOGLOBIN: 12.5 g/dL (ref 11.6–15.9)
LYMPHS ABS: 1.3 10*3/uL (ref 0.9–3.3)
Lymphocytes Relative: 20 %
MCH: 31 pg (ref 25.1–34.0)
MCHC: 34.6 g/dL (ref 31.5–36.0)
MCV: 89.7 fL (ref 79.5–101.0)
MONO ABS: 0.4 10*3/uL (ref 0.1–0.9)
MONOS PCT: 7 %
NEUTROS ABS: 4.8 10*3/uL (ref 1.5–6.5)
Neutrophils Relative %: 70 %
Platelet Count: 279 10*3/uL (ref 145–400)
RBC: 4.04 MIL/uL (ref 3.70–5.45)
RDW: 13 % (ref 11.2–14.5)
WBC Count: 6.8 10*3/uL (ref 3.9–10.3)

## 2018-02-21 NOTE — Therapy (Signed)
[place], [place], [address] Phone: [phone]   Fax:  [phone]  Physical Therapy Evaluation  Patient Details  Name: Marie [name] MRN: [id number] Date of Birth: [birth year] Referring Provider: Dr. Autumn Messing   Encounter Date: 02/21/2018  PT End of Session - 02/21/18 1546    Visit Number  1    Number of Visits  2    Date for PT Re-Evaluation  04/18/18    PT Start Time  8466    PT Stop Time  5993   Also saw pt from 1428-1458 for a total of 35 minutes   PT Time Calculation (min)  5 min    Activity Tolerance  Patient tolerated treatment well    Behavior During Therapy  [facility] for tasks assessed/performed       Past Medical History:  Diagnosis Date  . Dementia   . Depression   . Diabetes mellitus   . Hypertension   . Thyroid disease   . Type II or unspecified type diabetes mellitus without mention of complication, not stated as uncontrolled   . Vitamin D deficiency     Past Surgical History:  Procedure Laterality Date  . FRACTURE SURGERY  2009   right arm; rod  . TOTAL KNEE ARTHROPLASTY  june 2013   left    There were no vitals filed for this visit.   Subjective Assessment - 02/21/18 1448    Subjective  Patient reports she is here today to be seen by her medical team for her newly diagnosed right breast cancer.    Patient is accompained by:  Family member    Pertinent History  Patient was diagnosed on 02/01/18 with right grade II invasive ductal carcinoma breast cancer. It measures 6 mm and is located in the upper outer quadrant. It is ER/PR positive and HER2 negative with a Ki67 of 10%.     Patient Stated Goals  reduce lymphedema risk and learn post op shoulder ROM HEP.         [facility] PT Assessment - 02/21/18 0001      Assessment   Medical Diagnosis  Right breast cancer    Referring Provider  Dr. Autumn Messing    Onset Date/Surgical Date  02/01/18    Hand Dominance  Right    Prior  Therapy  none      Precautions   Precautions  Other (comment)    Precaution Comments  active cancer      Restrictions   Weight Bearing Restrictions  No      Balance Screen   Has the patient fallen in the past 6 months  No    Has the patient had a decrease in activity level because of a fear of falling?   No    Is the patient reluctant to leave their home because of a fear of falling?   No      Home Social worker  Private residence    Living Arrangements  Spouse/significant other;Children   Husband, daughter ,granddaughter   Available Help at Discharge  Family      Prior Function   Level of [place]  Retired    Leisure  walks some to walk her dog      Cognition   Overall Cognitive Status  Impaired/Different from baseline    Area of Impairment  Memory    Memory Comments  Reports short term memory deficits and husband  fills in as she is asked questions so it was difficult to assess      Posture/Postural Control   Posture/Postural Control  Postural limitations    Postural Limitations  Rounded Shoulders;Forward head      ROM / Strength   AROM / PROM / Strength  AROM;Strength      AROM   AROM Assessment Site  Shoulder;Cervical    Right/Left Shoulder  Right;Left    Right Shoulder Extension  37 Degrees    Right Shoulder Flexion  134 Degrees    Right Shoulder ABduction  156 Degrees    Right Shoulder Internal Rotation  72 Degrees    Right Shoulder External Rotation  90 Degrees    Left Shoulder Extension  36 Degrees    Left Shoulder Flexion  160 Degrees    Left Shoulder ABduction  153 Degrees    Left Shoulder Internal Rotation  71 Degrees    Left Shoulder External Rotation  83 Degrees    Cervical Flexion  WNL    Cervical Extension  WNL    Cervical - Right Side Bend  WNL    Cervical - Left Side Bend  WNL    Cervical - Right Rotation  WNL    Cervical - Left Rotation  WNL      Strength   Overall Strength  Within functional  limits for tasks performed        LYMPHEDEMA/ONCOLOGY QUESTIONNAIRE - 02/21/18 1544      Type   Cancer Type  Right breast cancer      Lymphedema Assessments   Lymphedema Assessments  Upper extremities      Right Upper Extremity Lymphedema   10 cm Proximal to Olecranon Process  23.2 cm    Olecranon Process  21.8 cm    10 cm Proximal to Ulnar Styloid Process  19.3 cm    Just Proximal to Ulnar Styloid Process  15.1 cm    Across Hand at [employer]  17.5 cm    At [place] of 2nd Digit  6.3 cm      Left Upper Extremity Lymphedema   10 cm Proximal to Olecranon Process  23.6 cm    Olecranon Process  21.5 cm    10 cm Proximal to Ulnar Styloid Process  18.4 cm    Just Proximal to Ulnar Styloid Process  14.8 cm    Across Hand at [employer]  16.8 cm    At [place] of 2nd Digit  5.8 cm             Objective measurements completed on examination: See above findings.    Patient was instructed today in a home exercise program today for post op shoulder range of motion. These included active assist shoulder flexion in sitting, scapular retraction, wall walking with shoulder abduction, and hands behind head external rotation.  She was encouraged to do these twice a day, holding 3 seconds and repeating 5 times when permitted by her physician.       PT Education - 02/21/18 1545    Education Details  Lymphedema risk reduction and post op shoulder ROM HEP    Person(s) Educated  Patient;Spouse    Methods  Explanation;Demonstration;Handout    Comprehension  Returned demonstration;Verbalized understanding          PT Long Term Goals - 02/21/18 1549      PT LONG TERM GOAL #1   Title  Patient will demonstrate she has regained full shoulder ROM and function  post operatively compared with baseline.    Time  [facility] Goals - 02/21/18 1549      Patient will be able to verbalize understanding of pertinent lymphedema risk reduction  practices relevant to her diagnosis specifically related to skin care.   Time  1    Period  Days    Status  Achieved      Patient will be able to return demonstrate and/or verbalize understanding of the post-op home exercise program related to regaining shoulder range of motion.   Time  1    Period  Days    Status  Achieved      Patient will be able to verbalize understanding of the importance of attending the postoperative After Breast Cancer Class for further lymphedema risk reduction education and therapeutic exercise.   Time  1    Period  Days    Status  Achieved            Plan - 02/21/18 1547    Clinical Impression Statement  Patient was diagnosed on 02/01/18 with right grade II invasive ductal carcinoma breast cancer. It measures 6 mm and is located in the upper outer quadrant. It is ER/PR positive and HER2 negative with a Ki67 of 10%. Her multidisciplinary medical team met prior to her assessments to determine a recommended treatment plan. She is planning to have a right lumpectomy and sentinel node biopsy followed by radiaiton and anti-estrogen therapy. She will benefit from a post op PT visit to reassess and determine needs.    History and Personal Factors relevant to plan of care:  Memory loss    Clinical Presentation  Stable    Clinical Decision Making  Low    Clinical Impairments Affecting Rehab Potential  None - may need reminders written due to memory loss    PT Frequency  --   Eval and 1 f/u visit   PT Treatment/Interventions  ADLs/Self Care Home Management;Therapeutic exercise;Patient/family education    PT Next Visit Plan  Will reassess 3-4 week post op to determine needs    PT Home Exercise Plan  Post op shoulder ROM HEP    Consulted and Agree with Plan of Care  Patient;Family member/caregiver    Family Member Consulted  Husband       Patient will benefit from skilled therapeutic intervention in order to improve the following deficits and impairments:  Decreased  knowledge of precautions, Impaired UE functional use, Postural dysfunction, Decreased range of motion  Visit Diagnosis: Malignant neoplasm of upper-outer quadrant of right breast in female, estrogen receptor positive ([place]) - Plan: PT plan of care cert/re-cert  Abnormal posture - Plan: PT plan of care cert/re-cert   Patient will follow up at outpatient cancer rehab 3-4 weeks following surgery.  If the patient requires physical therapy at that time, a specific plan will be dictated and sent to the referring physician for approval. The patient was educated today on appropriate basic range of motion exercises to begin post operatively and the importance of attending the After Breast Cancer class following surgery.  Patient was educated today on lymphedema risk reduction practices as it pertains to recommendations that will benefit the patient immediately following surgery.  She verbalized good understanding.     Problem List Patient Active Problem List   Diagnosis Date Noted  . Malignant neoplasm of upper-outer quadrant of right breast in female, estrogen receptor  positive ([place]) 02/13/2018  . CKD stage 3 due to type 2 diabetes mellitus ([place]) 10/04/2017  . Body mass index (BMI) of 24.0-24.9 in adult 05/05/2015  . Generalized anxiety disorder 08/04/2014  . Essential hypertension 04/28/2014  . Medication management 04/28/2014  . Vitamin D deficiency 10/04/2013  . Type 2 diabetes mellitus treated without insulin ([place])   . Depression, controlled   . SDAT    . Hyperlipidemia 07/28/2007  . Allergic rhinitis 07/28/2007   Annia Friendly, PT 02/21/18 3:52 PM   [place], [place], [address] Phone: [phone]   Fax:  [phone]  Name: KERRIN MARKMAN MRN: [id number] Date of Birth: 1951/03/25

## 2018-02-21 NOTE — Progress Notes (Signed)
Radiation Oncology         [phone] ________________________________  Multidisciplinary Breast Oncology Clinic [facility])  Initial Outpatient Consultation  Name: Marie [name] MRN: [id number]  Date: 02/21/2018  DOB: 1951/03/07  CC:Marie Pinto, MD  Marie Kussmaul, MD   REFERRING PHYSICIAN: Autumn Messing III, MD  DIAGNOSIS: The encounter diagnosis was Malignant neoplasm of upper-outer quadrant of right breast in female, estrogen receptor positive ([place]).    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 female who is presenting to the office today for evaluation of her newly diagnosed breast cancer. She is doing well overall.   She had routine screening mammography on 02/01/2018 showing a possible abnormality in the right breast. She underwent unilateral right diagnostic mammography with tomography and right breast ultrasonography on 02/09/2018 showing: Right breast 10 o'clock suspicious nodule. Right breast 1 o'clock indeterminate mass. No evidence of right axillary lymphadenopathy.   Accordingly on 02/09/2018 she proceeded to biopsy of the right breast area in question. The pathology from this procedure showed: Breast, right, needle core biopsy, upper outer quadrant, 10 o'clock with invasive ductal carcinoma, grade 2. Breast, right, needle core biopsy, upper, inner quadrant, 1 o'clock with benign breast tissue with fibrocystic change. Negative for carcinoma. Prognostic indicators significant for: estrogen receptor, 100% positive and progesterone receptor, 80% positive, both with strong staining intensity. Proliferation marker Ki67 at 10%. HER2 negative.  On review of systems, She reports tinnitus, heartburn, lump/pain to breast, and rash. She wears glasses and has a prior hx of DM and anxiety. She denies any other symptoms.    Menarche: 67 years old Age at first  live birth: 67 years old GP: GxP2 LMP: 1975 Contraceptive: No HRT: No   The patient was referred today for presentation in the multidisciplinary conference.  Radiology studies and pathology slides were presented there for review and discussion of treatment options.  A consensus was discussed regarding potential next steps.  PREVIOUS RADIATION THERAPY: No  PAST MEDICAL HISTORY:  has a past medical history of Dementia, Depression, Diabetes mellitus, Hypertension, Thyroid disease, Type II or unspecified type diabetes mellitus without mention of complication, not stated as uncontrolled, and Vitamin D deficiency.    PAST SURGICAL HISTORY: Past Surgical History:  Procedure Laterality Date  . FRACTURE SURGERY  2009   right arm; rod  . TOTAL KNEE ARTHROPLASTY  june 2013   left    FAMILY HISTORY: family history is not on file.  SOCIAL HISTORY:  reports that she has never smoked. She has never used smokeless tobacco. She reports that she does not drink alcohol or use drugs.  ALLERGIES: Penicillins  MEDICATIONS:  Current Outpatient Medications  Medication Sig Dispense Refill  . atorvastatin (LIPITOR) 80 MG tablet TAKE ONE TABLET BY MOUTH AT BEDTIME FOR CHOLESTEROL 90 tablet 0  . Blood Glucose Monitoring Suppl (ONETOUCH VERIO) w/Device KIT 1 kit by Does not apply route as needed. Use to check glucose QD DX-E11.22 1 kit 0  . cetirizine (ZYRTEC) 10 MG tablet Take 10 mg by mouth at bedtime.    . cholecalciferol (VITAMIN D) 1000 units tablet Take 1,000 Units by mouth daily.    . DULoxetine (CYMBALTA) 60 MG capsule Take 1 capsule (60 mg total) by mouth daily. 90 capsule 3  . glucose blood test strip CHECK BLOOD SUGAR 1 TIME DAILY-DX e11.22 100 each 12  .  memantine (NAMENDA XR) 28 MG CP24 24 hr capsule Take 28 mg by mouth.    . Multiple Vitamin (MULTIVITAMIN) tablet Take 1 tablet by mouth daily.    . ondansetron (ZOFRAN) 8 MG tablet Take 1/2 to 1 tablet 2 to 3 x / day if needed for nausea /  vomiting (Patient not taking: Reported on 02/21/2018) 30 tablet 2  . ONE TOUCH LANCETS MISC Use as directed to check glucose daily. DX-E11.22 100 each 1  . pravastatin (PRAVACHOL) 80 MG tablet Take 80 mg by mouth at bedtime.    . ranitidine (ZANTAC) 300 MG tablet Take 1 tablet 2 x / day for indigestion (Patient not taking: Reported on 02/21/2018) 60 tablet 2  . triamcinolone ointment (KENALOG) 0.5 % Apply 1 application topically 2 (two) times daily. Apply sparingly 2-3  x/ day (Do not use on face) (Patient not taking: Reported on 02/21/2018) 30 g 3   No current facility-administered medications for this encounter.     REVIEW OF SYSTEMS:  REVIEW OF SYSTEMS: A 10+ POINT REVIEW OF SYSTEMS WAS OBTAINED including neurology, dermatology, psychiatry, cardiac, respiratory, lymph, extremities, GI, GU, musculoskeletal, constitutional, reproductive, HEENT. All pertinent positives are noted in the HPI. All others are negative.   PHYSICAL EXAM:  Vitals with BMI 02/21/2018  Height 5' 5"   Weight 146 lbs 14 oz  BMI 70.01  Systolic 749  Diastolic 64  Pulse 88  Respirations 18    Lungs are clear to auscultation bilaterally. Heart has regular rate and rhythm. No palpable cervical, supraclavicular, or axillary adenopathy. Abdomen soft, non-tender, normal bowel sounds. Breast: Left breast large and pendulous without palpable mass, nipple discharge, or bleeding. Right breast with bruising in the UOQ. No nipple discharge or bleeding. No dominant mass palpable.    KPS = 90  100 - Normal; no complaints; no evidence of disease. 90   - Able to carry on normal activity; minor signs or symptoms of disease. 80   - Normal activity with effort; some signs or symptoms of disease. 25   - Cares for self; unable to carry on normal activity or to do active work. 60   - Requires occasional assistance, but is able to care for most of his personal needs. 50   - Requires considerable assistance and frequent medical care. 41   -  Disabled; requires special care and assistance. 44   - Severely disabled; hospital admission is indicated although death not imminent. 85   - Very sick; hospital admission necessary; active supportive treatment necessary. 10   - Moribund; fatal processes progressing rapidly. 0     - Dead  Karnofsky DA, Abelmann [place], Craver LS and Burchenal [facility] [phone]) The use of the nitrogen mustards in the palliative treatment of carcinoma: with particular reference to bronchogenic carcinoma Cancer 1 [phone]  LABORATORY DATA:  Lab Results  Component Value Date   WBC 6.8 02/21/2018   HGB 12.5 02/21/2018   HCT 36.2 02/21/2018   MCV 89.7 02/21/2018   PLT 279 02/21/2018   Lab Results  Component Value Date   NA 143 02/21/2018   K 3.7 02/21/2018   CL 106 02/21/2018   CO2 29 02/21/2018   Lab Results  Component Value Date   ALT 17 02/21/2018   AST 20 02/21/2018   ALKPHOS 73 02/21/2018   BILITOT 0.9 02/21/2018    RADIOGRAPHY: Dg Bone Density  Result Date: 02/01/2018 EXAM: DUAL X-RAY ABSORPTIOMETRY (DXA) FOR BONE MINERAL DENSITY IMPRESSION: Referring Physician:  Liane Comber Your patient  completed a BMD test using Lunar IDXA DXA system ( analysis version: 16 ) manufactured by [employer]. PATIENT: Name: Shahla, Betsill Patient ID: [id number] Birth Date: [birth year] Height: 64.0 in. Sex: Female Measured: 02/01/2018 Weight: 141.6 lbs. Indications: Caucasian, Estrogen Deficient, History of Fracture (Adult) (V15.51), Postmenopausal Fractures: Foot Treatments: Calcium (E943.0), Vitamin D (E933.5) ASSESSMENT: The BMD measured at Femur Neck Left is 0.823 g/cm2 with a T-score of -1.5. This patient is considered osteopenic according to [facility]) criteria. The scan quality is good. L-3, L-4 were excluded due to degenerative changes. Site Region Measured Date Measured Age YA BMD Significant CHANGE T-score DualFemur Neck Left  02/01/2018    [age]         -1.5    0.823 g/cm2 AP Spine  L1-L2       02/01/2018    [age]         -0.8    1.066 g/cm2 DualFemur Total Mean 3/01/2018    66.2         -1.2    0.861 g/cm2 World Health Organization [facility]) criteria for post-menopausal, Caucasian Women: Normal       T-score at or above -1 SD Osteopenia   T-score between -1 and -2.5 SD Osteoporosis T-score at or below -2.5 SD RECOMMENDATION: 1. All patients should optimize calcium and vitamin D intake. 2. Consider FDA approved medical therapies in postmenopausal women and men aged 25 years and older, based on the following: a. A hip or vertebral (clinical or morphometric) fracture b. T- score < or = -2.5 at the femoral neck or spine after appropriate evaluation to exclude secondary causes c. Low bone mass (T-score between -1.0 and -2.5 at the femoral neck or spine) and a 10 year probability of a hip fracture > or = 3% or a 10 year probability of a major osteoporosis-related fracture > or = 20% based on the [place]-adapted WHO algorithm d. Clinician judgment and/or patient preferences may indicate treatment for people with 10-year fracture probabilities above or below these levels FOLLOW-UP: People with diagnosed cases of osteoporosis or at high risk for fracture should have regular bone mineral density tests. For patients eligible for Medicare, routine testing is allowed once every 2 years. The testing frequency can be increased to one year for patients who have rapidly progressing disease, those who are receiving or discontinuing medical therapy to restore bone mass, or have additional risk factors. I have reviewed this report and agree with the above findings. [facility] FRAX* 10-year Probability of Fracture Based on femoral neck BMD: DualFemur (Left) Major Osteoporotic Fracture: 15.2% Hip Fracture:                1.8% Population:                  [place] (Caucasian) Risk Factors:                History of Fracture (Adult) (V15.51) *FRAX is a Materials engineer of the [employer] for Metabolic  Bone Disease, a World Pharmacologist (WHO) [facility]. ASSESSMENT: The probability of a major osteoporotic fracture is 15.2 % within the next ten years. The probability of hip fracture is  1.8  % within the next 10 years. Electronically Signed   By: Marijo Conception, M.D.   On: 02/01/2018 13:26   US Breast Ltd Uni Right Inc Axilla  Result Date: 02/09/2018 CLINICAL DATA:  Right inner breast possible mass seen on most recent screening  mammography. EXAM: DIGITAL DIAGNOSTIC RIGHT MAMMOGRAM WITH CAD AND TOMO ULTRASOUND RIGHT BREAST COMPARISON:  Previous exam(s). ACR Breast Density Category c: The breast tissue is heterogeneously dense, which may obscure small masses. FINDINGS: Additional mammographic views of the right breast demonstrate persistent circumscribed slightly over 1 cm in size mass in the right upper inner breast, middle to posterior depth. Additionally, there is a spiculated 6 mm nodule in the superior right breast, far posterior depth. Mammographic images were processed with CAD. On physical exam, no suspicious masses are palpated. Targeted ultrasound is performed, showing right breast 1 o'clock 8 cm from the nipple hypoechoic circumscribed mixed echogenicity mass, which measures 1.0 x 0.6 x 1.0 cm. This finding corresponds to the mammographically seen circumscribed mass in the upper inner right breast. Additionally, there is a right breast 10 o'clock, 14 cm from the nipple suspicious hypoechoic taller than wide nodule, which corresponds to the 6 mm spiculated mass seen mammographically in the superior right breast. It measures 0.5 x 0.6 x 0.4 cm sonographically. There is no evidence of right axillary lymphadenopathy. IMPRESSION: Right breast 10 o'clock suspicious nodule, for which ultrasound-guided core needle biopsy is recommended. Right breast 1 o'clock indeterminate mass, for which ultrasound-guided core needle biopsy is recommended. Attention on postprocedural mammogram to establish  mammographic/sonographic correlation is recommended. No evidence of right axillary lymphadenopathy. RECOMMENDATION: Ultrasound-guided core needle biopsy of the right breast 10 o'clock nodule and right breast 1 o'clock mass. I have discussed the findings and recommendations with the patient. Results were also provided in writing at the conclusion of the visit. If applicable, a reminder letter will be sent to the patient regarding the next appointment. BI-RADS CATEGORY  4: Suspicious. Electronically Signed   By: Fidela Salisbury M.D.   On: 02/09/2018 11:24   Mm Diag Breast Tomo Uni Right  Result Date: 02/09/2018 CLINICAL DATA:  Right inner breast possible mass seen on most recent screening mammography. EXAM: DIGITAL DIAGNOSTIC RIGHT MAMMOGRAM WITH CAD AND TOMO ULTRASOUND RIGHT BREAST COMPARISON:  Previous exam(s). ACR Breast Density Category c: The breast tissue is heterogeneously dense, which may obscure small masses. FINDINGS: Additional mammographic views of the right breast demonstrate persistent circumscribed slightly over 1 cm in size mass in the right upper inner breast, middle to posterior depth. Additionally, there is a spiculated 6 mm nodule in the superior right breast, far posterior depth. Mammographic images were processed with CAD. On physical exam, no suspicious masses are palpated. Targeted ultrasound is performed, showing right breast 1 o'clock 8 cm from the nipple hypoechoic circumscribed mixed echogenicity mass, which measures 1.0 x 0.6 x 1.0 cm. This finding corresponds to the mammographically seen circumscribed mass in the upper inner right breast. Additionally, there is a right breast 10 o'clock, 14 cm from the nipple suspicious hypoechoic taller than wide nodule, which corresponds to the 6 mm spiculated mass seen mammographically in the superior right breast. It measures 0.5 x 0.6 x 0.4 cm sonographically. There is no evidence of right axillary lymphadenopathy. IMPRESSION: Right breast 10  o'clock suspicious nodule, for which ultrasound-guided core needle biopsy is recommended. Right breast 1 o'clock indeterminate mass, for which ultrasound-guided core needle biopsy is recommended. Attention on postprocedural mammogram to establish mammographic/sonographic correlation is recommended. No evidence of right axillary lymphadenopathy. RECOMMENDATION: Ultrasound-guided core needle biopsy of the right breast 10 o'clock nodule and right breast 1 o'clock mass. I have discussed the findings and recommendations with the patient. Results were also provided in writing at the conclusion of the visit. If  applicable, a reminder letter will be sent to the patient regarding the next appointment. BI-RADS CATEGORY  4: Suspicious. Electronically Signed   By: Fidela Salisbury M.D.   On: 02/09/2018 11:24   Mm 3d Screen Breast Bilateral  Result Date: 02/01/2018 CLINICAL DATA:  Screening. EXAM: DIGITAL SCREENING BILATERAL MAMMOGRAM WITH TOMO AND CAD COMPARISON:  None ACR Breast Density Category c: The breast tissue is heterogeneously dense, which may obscure small masses. FINDINGS: In the right breast, a possible mass warrants further evaluation. In the left breast, no findings suspicious for malignancy. Images were processed with CAD. IMPRESSION: Further evaluation is suggested for possible mass in the right breast. RECOMMENDATION: Diagnostic mammogram and possibly ultrasound of the right breast. (Code:[id number]) The patient will be contacted regarding the findings, and additional imaging will be scheduled. BI-RADS CATEGORY  0: Incomplete. Need additional imaging evaluation and/or prior mammograms for comparison. Electronically Signed   By: Lajean Manes M.D.   On: 02/01/2018 12:25   Mm Clip Placement Right  Result Date: 02/09/2018 CLINICAL DATA:  Status post ultrasound-guided core needle biopsy of 2 masses right breast. EXAM: DIAGNOSTIC RIGHT MAMMOGRAM POST ULTRASOUND BIOPSY COMPARISON:  Previous exam(s). FINDINGS:  Mammographic images were obtained following ultrasound guided biopsy of 2 right breast masses. The ribbon shaped biopsy clip, seen on the lateral medial view, lies within the far upper-outer quadrant mass and the coil shaped biopsy clip lies along the posterior margin of the upper inner quadrant mass. IMPRESSION: Well-positioned biopsy clips following ultrasound-guided core needle biopsy of 2 right breast masses. Final Assessment: Post Procedure Mammograms for Marker Placement Electronically Signed   By: Lajean Manes M.D.   On: 02/09/2018 14:52   [place] Rt Breast Bx W Loc Dev 1st Lesion Img Bx Spec US Guide  Addendum Date: 02/13/2018   ADDENDUM REPORT: 02/13/2018 08:59 ADDENDUM: Pathology revealed GRADE 2 INVASIVE DUCTAL CARCINOMA of RIGHT breast, upper outer quadrant, 10 o'clock. This was found to be concordant by Dr. Lajean Manes. Pathology revealed BENIGN BREAST TISSUE WITH FIBROCYSTIC CHANGE is RIGHT breast, upper, inner quadrant, 1 o'clock. This was found to be concordant by Dr. Lajean Manes. Pathology results were discussed with the patient's husband by telephone per request. The patient reported doing well after the biopsy with tenderness at the site. Post biopsy instructions and care were reviewed and questions were answered. The patient's husband was encouraged to call [facility] for any additional concerns. The patient was referred to [facility] on February 21, 2018. Pathology results reported by Roselind Messier, RN on 02/13/2018. Electronically Signed   By: Lajean Manes M.D.   On: 02/13/2018 08:59   Result Date: 02/13/2018 CLINICAL DATA:  Patient presents for ultrasound-guided core needle biopsy of 2 masses in the right breast, the smaller more suspicious 10 o'clock position, 14 cm the nipple, and the other in the 1 o'clock position, 8 cm the nipple. EXAM: ULTRASOUND GUIDED RIGHT BREAST CORE NEEDLE  BIOPSY: 2 RIGHT BREAST MASSES BIOPSIED. COMPARISON:  Previous exam(s). FINDINGS: I met with the patient and we discussed the procedure of ultrasound-guided biopsy, including benefits and alternatives. We discussed the high likelihood of a successful procedure. We discussed the risks of the procedure, including infection, bleeding, tissue injury, clip migration, and inadequate sampling. Informed written consent was given. The usual time-out protocol was performed immediately prior to the procedure. Lesion #1 quadrant: Upper outer quadrant Using sterile technique and 1% Lidocaine as local anesthetic,  under direct ultrasound visualization, a 12 gauge spring-loaded device was used to perform biopsy of a small mass in the 10 o'clock position of the right breast using an inferior, lateral approach. At the conclusion of the procedure a ribbon shaped tissue marker clip was deployed into the biopsy cavity. Lesion #2 quadrant: Upper inner quadrant Using sterile technique and 1% Lidocaine as local anesthetic, under direct ultrasound visualization, a 12 gauge spring-loaded device was used to perform biopsy of the 1 o'clock position right breast mass using an inferior, medial approach. At the conclusion of the procedure a coil shaped tissue marker clip was deployed into the biopsy cavity. Follow up 2 view mammogram was performed and dictated separately. IMPRESSION: Ultrasound guided biopsy of 2 right breast masses. No apparent complications. Electronically Signed: By: Lajean Manes M.D. On: 02/09/2018 14:39   [place] Rt Breast Bx W Loc Dev Ea Add Lesion Img Bx Spec US Guide  Addendum Date: 02/13/2018   ADDENDUM REPORT: 02/13/2018 08:59 ADDENDUM: Pathology revealed GRADE 2 INVASIVE DUCTAL CARCINOMA of RIGHT breast, upper outer quadrant, 10 o'clock. This was found to be concordant by Dr. Lajean Manes. Pathology revealed BENIGN BREAST TISSUE WITH FIBROCYSTIC CHANGE is RIGHT breast, upper, inner quadrant, 1 o'clock. This was found to  be concordant by Dr. Lajean Manes. Pathology results were discussed with the patient's husband by telephone per request. The patient reported doing well after the biopsy with tenderness at the site. Post biopsy instructions and care were reviewed and questions were answered. The patient's husband was encouraged to call [facility] for any additional concerns. The patient was referred to [facility] on February 21, 2018. Pathology results reported by Roselind Messier, RN on 02/13/2018. Electronically Signed   By: Lajean Manes M.D.   On: 02/13/2018 08:59   Result Date: 02/13/2018 CLINICAL DATA:  Patient presents for ultrasound-guided core needle biopsy of 2 masses in the right breast, the smaller more suspicious 10 o'clock position, 14 cm the nipple, and the other in the 1 o'clock position, 8 cm the nipple. EXAM: ULTRASOUND GUIDED RIGHT BREAST CORE NEEDLE BIOPSY: 2 RIGHT BREAST MASSES BIOPSIED. COMPARISON:  Previous exam(s). FINDINGS: I met with the patient and we discussed the procedure of ultrasound-guided biopsy, including benefits and alternatives. We discussed the high likelihood of a successful procedure. We discussed the risks of the procedure, including infection, bleeding, tissue injury, clip migration, and inadequate sampling. Informed written consent was given. The usual time-out protocol was performed immediately prior to the procedure. Lesion #1 quadrant: Upper outer quadrant Using sterile technique and 1% Lidocaine as local anesthetic, under direct ultrasound visualization, a 12 gauge spring-loaded device was used to perform biopsy of a small mass in the 10 o'clock position of the right breast using an inferior, lateral approach. At the conclusion of the procedure a ribbon shaped tissue marker clip was deployed into the biopsy cavity. Lesion #2 quadrant: Upper inner quadrant Using sterile technique and 1%  Lidocaine as local anesthetic, under direct ultrasound visualization, a 12 gauge spring-loaded device was used to perform biopsy of the 1 o'clock position right breast mass using an inferior, medial approach. At the conclusion of the procedure a coil shaped tissue marker clip was deployed into the biopsy cavity. Follow up 2 view mammogram was performed and dictated separately. IMPRESSION: Ultrasound guided biopsy of 2 right breast masses. No apparent complications. Electronically Signed: By: Lajean Manes M.D. 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 Patient will proceed with lumpectomy and sentinel node procedure under the direction of Dr. Marlou Starks. Depending on the size of the lesion, the patient may be a candidate for Oncotype Dx testing (If tumor is greater than 5 mm). The patient does wish to proceed with breast conserving surgery and not mastectomy.   Today, I talked to the patient and family about the findings and work-up thus far.  We discussed the natural history of breast cancer and general treatment, highlighting the role of radiotherapy in the management.  We discussed the available radiation techniques, and focused on the details of logistics and delivery.  We reviewed the anticipated acute and late sequelae associated with radiation in this setting.  The patient was encouraged to ask questions that I answered to the best of my ability.    PLAN:  1. Lumpectomy and sentinel node procedure 2. Possible Oncotype Dx testing depending on tumor size 3. Adjuvant radiation therapy 4. Aromatase inhibitor   ------------------------------------------------  Blair Promise, PhD, MD   This document serves as a record of services personally performed by Gery Pray, MD. It was created on  his behalf by Steva Colder, a trained medical scribe. The creation of this record is based on the scribe's personal observations and the provider's statements to them. This document has been checked and approved by the attending provider.

## 2018-02-21 NOTE — Patient Instructions (Signed)

## 2018-02-22 ENCOUNTER — Telehealth: Payer: Self-pay | Admitting: Oncology

## 2018-02-22 ENCOUNTER — Encounter: Payer: Self-pay | Admitting: General Practice

## 2018-02-22 NOTE — Telephone Encounter (Signed)
Scheduled apt per 8/28 sch message - sent reminder letter in the mail with appt date and time.

## 2018-02-22 NOTE — Progress Notes (Signed)
Colton participated in [facility] to introduce [facility] team/resources, completing distress screen per protocol.  The patient scored a 5 on the Psychosocial Distress Thermometer which indicates moderate distress.  ONCBCN DISTRESS SCREENING 02/22/2018  Screening Type Initial Screening  Distress experienced in past week (1-10) 5  Referral to support programs Yes   Ms Cherry was unable to stay to meet [facility] representative at clinic, and I was unable to reach her by phone to f/u.  Follow up needed: No. Mailing full packet of [place], including my card and handwritten note of encouragement, but please also page if needs arise or circumstances change. Thank you.   [place], [place], [facility] Pager [phone] Voicemail [phone]

## 2018-02-22 NOTE — Telephone Encounter (Signed)
Per 8/28 no los °

## 2018-02-27 ENCOUNTER — Other Ambulatory Visit: Payer: Self-pay | Admitting: General Surgery

## 2018-02-27 DIAGNOSIS — Z170 Estrogen receptor positive status [ER+]: Principal | ICD-10-CM

## 2018-02-27 DIAGNOSIS — C50411 Malignant neoplasm of upper-outer quadrant of right female breast: Secondary | ICD-10-CM

## 2018-03-01 ENCOUNTER — Telehealth: Payer: Self-pay | Admitting: *Deleted

## 2018-03-01 DIAGNOSIS — C50411 Malignant neoplasm of upper-outer quadrant of right female breast: Secondary | ICD-10-CM

## 2018-03-01 DIAGNOSIS — Z170 Estrogen receptor positive status [ER+]: Principal | ICD-10-CM

## 2018-03-01 NOTE — Telephone Encounter (Signed)
  Oncology Nurse Navigator Documentation  Navigator Location: [facility] (03/01/18 1400)   )Navigator Encounter Type: Telephone;[facility] Follow-up (03/01/18 1400) Telephone: Outgoing Call;Clinic/[facility]C Follow-up (03/01/18 1400)     Surgery Date: 03/08/18 (03/01/18 1400)           Treatment Initiated Date: 03/08/18 (03/01/18 1400)                                Time Spent with Patient: 15 (03/01/18 1400)

## 2018-03-05 ENCOUNTER — Encounter (HOSPITAL_BASED_OUTPATIENT_CLINIC_OR_DEPARTMENT_OTHER): Payer: Self-pay | Admitting: *Deleted

## 2018-03-05 ENCOUNTER — Other Ambulatory Visit: Payer: Self-pay

## 2018-03-07 ENCOUNTER — Ambulatory Visit
Admission: RE | Admit: 2018-03-07 | Discharge: 2018-03-07 | Disposition: A | Discharge status: Home / Self Care | Payer: Medicare Other | Source: Ambulatory Visit | Attending: General Surgery | Admitting: General Surgery

## 2018-03-07 ENCOUNTER — Other Ambulatory Visit: Payer: Self-pay | Admitting: General Surgery

## 2018-03-07 DIAGNOSIS — Z170 Estrogen receptor positive status [ER+]: Principal | ICD-10-CM

## 2018-03-07 DIAGNOSIS — C50411 Malignant neoplasm of upper-outer quadrant of right female breast: Secondary | ICD-10-CM

## 2018-03-07 NOTE — Progress Notes (Signed)
Patient's husband given Ensure pre surgery drink with instructions for DOS.  He was also given Hibiclens with instructions for DOS.  Husband verbalized understanding of instructions given.

## 2018-03-08 ENCOUNTER — Ambulatory Visit
Admission: RE | Admit: 2018-03-08 | Discharge: 2018-03-08 | Disposition: A | Discharge status: Home / Self Care | Payer: Medicare Other | Source: Ambulatory Visit | Attending: General Surgery | Admitting: General Surgery

## 2018-03-08 ENCOUNTER — Other Ambulatory Visit: Payer: Self-pay

## 2018-03-08 ENCOUNTER — Ambulatory Visit (HOSPITAL_COMMUNITY): Payer: Medicare Other

## 2018-03-08 ENCOUNTER — Encounter (HOSPITAL_BASED_OUTPATIENT_CLINIC_OR_DEPARTMENT_OTHER): Payer: Self-pay | Admitting: Anesthesiology

## 2018-03-08 ENCOUNTER — Ambulatory Visit (HOSPITAL_COMMUNITY)
Admission: RE | Admit: 2018-03-08 | Discharge: 2018-03-08 | Disposition: A | Discharge status: Home / Self Care | Payer: Medicare Other | Source: Ambulatory Visit | Attending: General Surgery | Admitting: General Surgery

## 2018-03-08 ENCOUNTER — Ambulatory Visit (HOSPITAL_BASED_OUTPATIENT_CLINIC_OR_DEPARTMENT_OTHER)
Admission: RE | Admit: 2018-03-08 | Discharge: 2018-03-08 | Disposition: A | Discharge status: Home / Self Care | Payer: Medicare Other | Source: Ambulatory Visit | Attending: General Surgery | Admitting: General Surgery

## 2018-03-08 ENCOUNTER — Ambulatory Visit (HOSPITAL_BASED_OUTPATIENT_CLINIC_OR_DEPARTMENT_OTHER): Payer: Medicare Other | Admitting: Anesthesiology

## 2018-03-08 ENCOUNTER — Encounter (HOSPITAL_BASED_OUTPATIENT_CLINIC_OR_DEPARTMENT_OTHER)
Admission: RE | Disposition: A | Discharge status: Home / Self Care | Payer: Self-pay | Source: Ambulatory Visit | Attending: General Surgery

## 2018-03-08 DIAGNOSIS — Z79899 Other long term (current) drug therapy: Secondary | ICD-10-CM | POA: Diagnosis not present

## 2018-03-08 DIAGNOSIS — C50411 Malignant neoplasm of upper-outer quadrant of right female breast: Secondary | ICD-10-CM | POA: Diagnosis not present

## 2018-03-08 DIAGNOSIS — Z170 Estrogen receptor positive status [ER+]: Principal | ICD-10-CM

## 2018-03-08 DIAGNOSIS — E119 Type 2 diabetes mellitus without complications: Secondary | ICD-10-CM | POA: Diagnosis not present

## 2018-03-08 DIAGNOSIS — F419 Anxiety disorder, unspecified: Secondary | ICD-10-CM | POA: Insufficient documentation

## 2018-03-08 DIAGNOSIS — Z7984 Long term (current) use of oral hypoglycemic drugs: Secondary | ICD-10-CM | POA: Insufficient documentation

## 2018-03-08 DIAGNOSIS — I10 Essential (primary) hypertension: Secondary | ICD-10-CM | POA: Insufficient documentation

## 2018-03-08 DIAGNOSIS — F329 Major depressive disorder, single episode, unspecified: Secondary | ICD-10-CM | POA: Insufficient documentation

## 2018-03-08 DIAGNOSIS — F0390 Unspecified dementia without behavioral disturbance: Secondary | ICD-10-CM | POA: Diagnosis not present

## 2018-03-08 HISTORY — PX: BREAST LUMPECTOMY WITH RADIOACTIVE SEED AND SENTINEL LYMPH NODE BIOPSY: SHX6550

## 2018-03-08 LAB — GLUCOSE, CAPILLARY
GLUCOSE-CAPILLARY: 182 mg/dL — AB (ref 70–99)
Glucose-Capillary: 164 mg/dL — ABNORMAL HIGH (ref 70–99)

## 2018-03-08 SURGERY — BREAST LUMPECTOMY WITH RADIOACTIVE SEED AND SENTINEL LYMPH NODE BIOPSY
Anesthesia: General | Site: Breast | Laterality: Right

## 2018-03-08 MED ORDER — CELECOXIB 200 MG PO CAPS
ORAL_CAPSULE | ORAL | Status: AC
  Filled 2018-03-08: qty 1

## 2018-03-08 MED ORDER — HYDROCODONE-ACETAMINOPHEN 5-325 MG PO TABS: 1.0000 | ORAL_TABLET | Freq: Four times a day (QID) | ORAL | 0 refills | Status: DC | PRN

## 2018-03-08 MED ORDER — FENTANYL CITRATE (PF) 100 MCG/2ML IJ SOLN
INTRAMUSCULAR | Status: AC
  Filled 2018-03-08: qty 2

## 2018-03-08 MED ORDER — ONDANSETRON HCL 4 MG/2ML IJ SOLN: 4.0000 mg | Freq: Once | INTRAMUSCULAR | Status: DC | PRN

## 2018-03-08 MED ORDER — BUPIVACAINE-EPINEPHRINE (PF) 0.5% -1:200000 IJ SOLN
INTRAMUSCULAR | Status: DC | PRN
  Administered 2018-03-08: 30 mL via PERINEURAL

## 2018-03-08 MED ORDER — ACETAMINOPHEN 500 MG PO TABS
1000.0000 mg | ORAL_TABLET | ORAL | Status: AC
  Administered 2018-03-08: 1000 mg via ORAL

## 2018-03-08 MED ORDER — TECHNETIUM TC 99M SULFUR COLLOID FILTERED: 1.0000 | Freq: Once | INTRAVENOUS | Status: DC | PRN

## 2018-03-08 MED ORDER — FENTANYL CITRATE (PF) 100 MCG/2ML IJ SOLN
50.0000 ug | INTRAMUSCULAR | Status: AC | PRN
  Administered 2018-03-08 (×3): 50 ug via INTRAVENOUS

## 2018-03-08 MED ORDER — GABAPENTIN 300 MG PO CAPS
300.0000 mg | ORAL_CAPSULE | ORAL | Status: AC
  Administered 2018-03-08: 300 mg via ORAL

## 2018-03-08 MED ORDER — LACTATED RINGERS IV SOLN
INTRAVENOUS | Status: DC
  Administered 2018-03-08 (×2): via INTRAVENOUS

## 2018-03-08 MED ORDER — OXYCODONE HCL 5 MG PO TABS: 5.0000 mg | ORAL_TABLET | Freq: Once | ORAL | Status: DC | PRN

## 2018-03-08 MED ORDER — ACETAMINOPHEN 500 MG PO TABS
ORAL_TABLET | ORAL | Status: AC
  Filled 2018-03-08: qty 2

## 2018-03-08 MED ORDER — ONDANSETRON HCL 4 MG/2ML IJ SOLN
INTRAMUSCULAR | Status: DC | PRN
  Administered 2018-03-08: 4 mg via INTRAVENOUS

## 2018-03-08 MED ORDER — DEXAMETHASONE SODIUM PHOSPHATE 4 MG/ML IJ SOLN
INTRAMUSCULAR | Status: DC | PRN
  Administered 2018-03-08: 4 mg via INTRAVENOUS

## 2018-03-08 MED ORDER — VANCOMYCIN HCL IN DEXTROSE 1-5 GM/200ML-% IV SOLN
1000.0000 mg | INTRAVENOUS | Status: AC
  Administered 2018-03-08: 1000 mg via INTRAVENOUS

## 2018-03-08 MED ORDER — BUPIVACAINE HCL (PF) 0.25 % IJ SOLN
INTRAMUSCULAR | Status: DC | PRN
  Administered 2018-03-08: 10 mL

## 2018-03-08 MED ORDER — CHLORHEXIDINE GLUCONATE CLOTH 2 % EX PADS: 6.0000 | MEDICATED_PAD | Freq: Once | CUTANEOUS | Status: DC

## 2018-03-08 MED ORDER — EPHEDRINE 5 MG/ML INJ
INTRAVENOUS | Status: AC
  Filled 2018-03-08: qty 20

## 2018-03-08 MED ORDER — OXYCODONE HCL 5 MG/5ML PO SOLN: 5.0000 mg | Freq: Once | ORAL | Status: DC | PRN

## 2018-03-08 MED ORDER — MEPERIDINE HCL 25 MG/ML IJ SOLN: 6.2500 mg | INTRAMUSCULAR | Status: DC | PRN

## 2018-03-08 MED ORDER — SCOPOLAMINE 1 MG/3DAYS TD PT72: 1.0000 | MEDICATED_PATCH | Freq: Once | TRANSDERMAL | Status: DC | PRN

## 2018-03-08 MED ORDER — PROPOFOL 10 MG/ML IV BOLUS
INTRAVENOUS | Status: DC | PRN
  Administered 2018-03-08: 100 mg via INTRAVENOUS

## 2018-03-08 MED ORDER — MIDAZOLAM HCL 2 MG/2ML IJ SOLN
INTRAMUSCULAR | Status: AC
  Filled 2018-03-08: qty 2

## 2018-03-08 MED ORDER — EPHEDRINE SULFATE 50 MG/ML IJ SOLN
INTRAMUSCULAR | Status: DC | PRN
  Administered 2018-03-08 (×2): 10 mg via INTRAVENOUS

## 2018-03-08 MED ORDER — VANCOMYCIN HCL IN DEXTROSE 1-5 GM/200ML-% IV SOLN
INTRAVENOUS | Status: AC
  Filled 2018-03-08: qty 200

## 2018-03-08 MED ORDER — CELECOXIB 200 MG PO CAPS
200.0000 mg | ORAL_CAPSULE | ORAL | Status: AC
  Administered 2018-03-08: 200 mg via ORAL

## 2018-03-08 MED ORDER — GABAPENTIN 300 MG PO CAPS
ORAL_CAPSULE | ORAL | Status: AC
  Filled 2018-03-08: qty 1

## 2018-03-08 MED ORDER — PHENYLEPHRINE 40 MCG/ML (10ML) SYRINGE FOR IV PUSH (FOR BLOOD PRESSURE SUPPORT)
PREFILLED_SYRINGE | INTRAVENOUS | Status: AC
  Filled 2018-03-08: qty 10

## 2018-03-08 MED ORDER — FENTANYL CITRATE (PF) 100 MCG/2ML IJ SOLN: 25.0000 ug | INTRAMUSCULAR | Status: DC | PRN

## 2018-03-08 MED ORDER — MIDAZOLAM HCL 2 MG/2ML IJ SOLN: 1.0000 mg | INTRAMUSCULAR | Status: DC | PRN

## 2018-03-08 MED ORDER — LIDOCAINE HCL (CARDIAC) PF 100 MG/5ML IV SOSY
PREFILLED_SYRINGE | INTRAVENOUS | Status: DC | PRN
  Administered 2018-03-08: 30 mg via INTRAVENOUS

## 2018-03-08 MED ORDER — CLONIDINE HCL (ANALGESIA) 100 MCG/ML EP SOLN
EPIDURAL | Status: DC | PRN
  Administered 2018-03-08: 50 ug

## 2018-03-08 SURGICAL SUPPLY — 48 items
ADH SKN CLS APL DERMABOND .7 (GAUZE/BANDAGES/DRESSINGS) ×1
APPLIER CLIP 9.375 MED OPEN (MISCELLANEOUS) ×6
APR CLP MED 9.3 20 MLT OPN (MISCELLANEOUS) ×2
BLADE SURG 15 STRL LF DISP TIS (BLADE) ×1 IMPLANT
BLADE SURG 15 STRL SS (BLADE) ×2
CANISTER SUC SOCK COL 7IN (MISCELLANEOUS) IMPLANT
CANISTER SUCT 1200ML W/VALVE (MISCELLANEOUS) IMPLANT
CHLORAPREP W/TINT 26ML (MISCELLANEOUS) ×3 IMPLANT
CLIP APPLIE 9.375 MED OPEN (MISCELLANEOUS) ×2 IMPLANT
COVER BACK TABLE 60X90IN (DRAPES) ×3 IMPLANT
COVER MAYO STAND STRL (DRAPES) ×3 IMPLANT
COVER PROBE W GEL 5X96 (DRAPES) ×3 IMPLANT
DECANTER SPIKE VIAL GLASS SM (MISCELLANEOUS) IMPLANT
DERMABOND ADVANCED (GAUZE/BANDAGES/DRESSINGS) ×2
DERMABOND ADVANCED .7 DNX12 (GAUZE/BANDAGES/DRESSINGS) ×1 IMPLANT
DEVICE DUBIN W/COMP PLATE 8390 (MISCELLANEOUS) ×3 IMPLANT
DRAPE LAPAROSCOPIC ABDOMINAL (DRAPES) ×3 IMPLANT
DRAPE UTILITY XL STRL (DRAPES) ×3 IMPLANT
ELECT COATED BLADE 2.86 ST (ELECTRODE) ×3 IMPLANT
ELECT REM PT RETURN 9FT ADLT (ELECTROSURGICAL) ×3
ELECTRODE REM PT RTRN 9FT ADLT (ELECTROSURGICAL) ×1 IMPLANT
GLOVE BIO SURGEON STRL SZ7 (GLOVE) ×3 IMPLANT
GLOVE BIO SURGEON STRL SZ7.5 (GLOVE) ×3 IMPLANT
GLOVE EXAM NITRILE EXT CUFF MD (GLOVE) ×3 IMPLANT
GOWN STRL REUS W/ TWL LRG LVL3 (GOWN DISPOSABLE) ×1 IMPLANT
GOWN STRL REUS W/ TWL XL LVL3 (GOWN DISPOSABLE) ×1 IMPLANT
GOWN STRL REUS W/TWL LRG LVL3 (GOWN DISPOSABLE) ×3
GOWN STRL REUS W/TWL XL LVL3 (GOWN DISPOSABLE) ×3
ILLUMINATOR WAVEGUIDE N/F (MISCELLANEOUS) IMPLANT
KIT MARKER MARGIN INK (KITS) ×3 IMPLANT
LIGHT WAVEGUIDE WIDE FLAT (MISCELLANEOUS) IMPLANT
NDL SAFETY ECLIPSE 18X1.5 (NEEDLE) IMPLANT
NEEDLE HYPO 18GX1.5 SHARP (NEEDLE)
NEEDLE HYPO 25X1 1.5 SAFETY (NEEDLE) ×3 IMPLANT
NS IRRIG 1000ML POUR BTL (IV SOLUTION) ×3 IMPLANT
PACK BASIN DAY SURGERY FS (CUSTOM PROCEDURE TRAY) ×3 IMPLANT
PENCIL BUTTON HOLSTER BLD 10FT (ELECTRODE) ×3 IMPLANT
SLEEVE SCD COMPRESS KNEE MED (MISCELLANEOUS) ×3 IMPLANT
SPONGE LAP 18X18 RF (DISPOSABLE) ×3 IMPLANT
SUT MON AB 4-0 PC3 18 (SUTURE) ×6 IMPLANT
SUT SILK 2 0 SH (SUTURE) IMPLANT
SUT VICRYL 3-0 CR8 SH (SUTURE) ×3 IMPLANT
SYR CONTROL 10ML LL (SYRINGE) ×3 IMPLANT
TOWEL GREEN STERILE FF (TOWEL DISPOSABLE) ×3 IMPLANT
TOWEL OR NON WOVEN STRL DISP B (DISPOSABLE) IMPLANT
TUBE CONNECTING 20'X1/4 (TUBING) ×1
TUBE CONNECTING 20X1/4 (TUBING) ×2 IMPLANT
YANKAUER SUCT BULB TIP NO VENT (SUCTIONS) ×3 IMPLANT

## 2018-03-08 NOTE — Discharge Instructions (Signed)
° ° °  Post Anesthesia Home Care Instructions  Activity: Get plenty of rest for the remainder of the day. A responsible individual must stay with you for 24 hours following the procedure.  For the next 24 hours, DO NOT: -Drive a car -Paediatric nurse -Drink alcoholic beverages -Take any medication unless instructed by your physician -Make any legal decisions or sign important papers.  Meals: Start with liquid foods such as gelatin or soup. Progress to regular foods as tolerated. Avoid greasy, spicy, heavy foods. If nausea and/or vomiting occur, drink only clear liquids until the nausea and/or vomiting subsides. Call your physician if vomiting continues.  Special Instructions/Symptoms: Your throat may feel dry or sore from the anesthesia or the breathing tube placed in your throat during surgery. If this causes discomfort, gargle with warm salt water. The discomfort should disappear within 24 hours.  If you had a scopolamine patch placed behind your ear for the management of post- operative nausea and/or vomiting:  1. The medication in the patch is effective for 72 hours, after which it should be removed.  Wrap patch in a tissue and discard in the trash. Wash hands thoroughly with soap and water. 2. You may remove the patch earlier than 72 hours if you experience unpleasant side effects which may include dry mouth, dizziness or visual disturbances. 3. Avoid touching the patch. Wash your hands with soap and water after contact with the patch.    You had tylenol and celebrex at 8:00 am. You can have tylenol/ ibuprofen /motrin / or ibuprofen AFTER 2 pm if needed for pain. You may start your norco/vicodin prescription once you get home. Do not take norco/vicodin and tylenol at the same time as the norco/vicodin prescription has tylenol in it already.

## 2018-03-08 NOTE — Anesthesia Postprocedure Evaluation (Signed)
Anesthesia Post Note  Patient: Marie [name]  Procedure(s) Performed: BREAST LUMPECTOMY WITH RADIOACTIVE SEED AND SENTINEL LYMPH NODE BIOPSY (Right Breast)     Patient location during evaluation: PACU Anesthesia Type: General Level of consciousness: awake and alert and oriented Pain management: pain level controlled Vital Signs Assessment: post-procedure vital signs reviewed and stable Respiratory status: spontaneous breathing, nonlabored ventilation and respiratory function stable Cardiovascular status: blood pressure returned to baseline and stable Postop Assessment: no apparent nausea or vomiting Anesthetic complications: no    Last Vitals:  Vitals:   03/08/18 1145 03/08/18 1200  BP: 123/75 136/78  Pulse: 93 96  Resp: 11 16  Temp:    SpO2: 100% 100%    Last Pain:  Vitals:   03/08/18 1200  TempSrc:   PainSc: 0-No pain                 Claris Pech A.

## 2018-03-08 NOTE — Anesthesia Procedure Notes (Signed)
Procedure Name: LMA Insertion Date/Time: 03/08/2018 10:09 AM Performed by: Signe Colt, CRNA Pre-anesthesia Checklist: Patient identified, Emergency Drugs available, Suction available and Patient being monitored Patient Re-evaluated:Patient Re-evaluated prior to induction Oxygen Delivery Method: Circle system utilized Preoxygenation: Pre-oxygenation with 100% oxygen Induction Type: IV induction Ventilation: Mask ventilation without difficulty LMA: LMA inserted LMA Size: 4.0 Number of attempts: 1 Airway Equipment and Method: Bite block Placement Confirmation: positive ETCO2 Tube secured with: Tape Dental Injury: Teeth and Oropharynx as per pre-operative assessment

## 2018-03-08 NOTE — H&P (Signed)
Marie [name]  Location: [facility] Surgery Patient #: [id number] DOB: Jan 18, 1951 Undefined / Language: Cleophus Molt / Race: White Female   History of Present Illness  The patient is a 67 year old female who presents with breast cancer. We are asked to see the patient in consultation by Dr. Sondra Come to evaluate her for a new right breast cancer. The patient is a 67 year old white female who presents with 2 masses in the right breast. 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 it measured 53m. 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  Medication History  DULoxetine HCl (60MG Capsule DR Part, Oral daily) Active. Triamcinolone Acetonide (0.5% Ointment, External) Active. Zantac (300MG Tablet, Oral daily) Active. Medications Reconciled    Review of Systems  General Not Present- Appetite Loss, Chills, Fatigue, Fever, Night Sweats, Weight Gain and Weight Loss. Note: All other systems negative (unless as noted in HPI & included Review of Systems) Skin Not Present- Change in Wart/Mole, Dryness, Hives, Jaundice, New Lesions, Non-Healing Wounds, Rash and Ulcer. HEENT Not Present- Earache, Hearing Loss, Hoarseness, Nose Bleed, Oral Ulcers, Ringing in the Ears, Seasonal Allergies, Sinus Pain, Sore Throat, Visual Disturbances, Wears glasses/contact lenses and Yellow Eyes. Respiratory Not Present- Bloody sputum, Chronic Cough, Difficulty Breathing, Snoring and Wheezing. Breast Not Present- Breast Mass, Breast Pain, Nipple Discharge and Skin Changes. Cardiovascular Not Present- Chest Pain, Difficulty Breathing Lying Down, Leg Cramps, Palpitations, Rapid Heart Rate, Shortness of Breath and Swelling of Extremities. Gastrointestinal Not  Present- Abdominal Pain, Bloating, Bloody Stool, Change in Bowel Habits, Chronic diarrhea, Constipation, Difficulty Swallowing, Excessive gas, Gets full quickly at meals, Hemorrhoids, Indigestion, Nausea, Rectal Pain and Vomiting. Female Genitourinary Not Present- Frequency, Nocturia, Painful Urination, Pelvic Pain and Urgency. Musculoskeletal Not Present- Back Pain, Joint Pain, Joint Stiffness, Muscle Pain, Muscle Weakness and Swelling of Extremities. Neurological Present- Decreased Memory. Not Present- Fainting, Headaches, Numbness, Seizures, Tingling, Tremor, Trouble walking and Weakness. Psychiatric Not Present- Anxiety, Bipolar, Change in Sleep Pattern, Depression, Fearful and Frequent crying. Endocrine Not Present- Cold Intolerance, Excessive Hunger, Hair Changes, Heat Intolerance, Hot flashes and New Diabetes. Hematology Not Present- Easy Bruising, Excessive bleeding, Gland problems, HIV and Persistent Infections.   Physical Exam  General Mental Status-Alert. General Appearance-Consistent with stated age. Hydration-Well hydrated. Voice-Normal.  Head and Neck Head-normocephalic, atraumatic with no lesions or palpable masses. Trachea-midline. Thyroid Gland Characteristics - normal size and consistency.  Eye Eyeball - Bilateral-Extraocular movements intact. Sclera/Conjunctiva - Bilateral-No scleral icterus.  Chest and Lung Exam Chest and lung exam reveals -quiet, even and easy respiratory effort with no use of accessory muscles and on auscultation, normal breath sounds, no adventitious sounds and normal vocal resonance. Inspection Chest Wall - Normal. Back - normal.  Breast Note: There is a 1cm palpable mass superficially in the UIQ of the right breast. There is no other palpable mass in either breast. There is no palpable axillary, supraclavicular, or cervical lymphadenopathy   Cardiovascular Cardiovascular examination reveals -normal heart sounds,  regular rate and rhythm with no murmurs and normal pedal pulses bilaterally.  Abdomen Inspection Inspection of the abdomen reveals - No Hernias. Skin - Scar - no surgical scars. Palpation/Percussion Palpation and Percussion of the abdomen reveal - Soft,  Non Tender, No Rebound tenderness, No Rigidity (guarding) and No hepatosplenomegaly. Auscultation Auscultation of the abdomen reveals - Bowel sounds normal.  Neurologic Neurologic evaluation reveals -alert and oriented x 3 with no impairment of recent or remote memory. Mental Status-Normal.  Musculoskeletal Normal Exam - Left-Upper Extremity Strength Normal and Lower Extremity Strength Normal. Normal Exam - Right-Upper Extremity Strength Normal and Lower Extremity Strength Normal.  Lymphatic Head & Neck  General Head & Neck Lymphatics: Bilateral - Description - Normal. Axillary  General Axillary Region: Bilateral - Description - Normal. Tenderness - Non Tender. Femoral & Inguinal  Generalized Femoral & Inguinal Lymphatics: Bilateral - Description - Normal. Tenderness - Non Tender.    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 I have discussed with her the options for treatment and at this point she favors breast conservation and sentinel node mapping. I have discussed with her the risks and benefits of the surgery as well as some of the technical aspects and she understands and wishes to proceed

## 2018-03-08 NOTE — Op Note (Signed)
03/08/2018  11:25 AM  PATIENT:  Marie [name]  67 y.o. female  PRE-OPERATIVE DIAGNOSIS:  right breast cancer  POST-OPERATIVE DIAGNOSIS:  right breast cancer  PROCEDURE:  Procedure(s): RIGHT BREAST LUMPECTOMY WITH RADIOACTIVE SEED LOCALIZATION AND DEEP RIGHT AXILLARY SENTINEL LYMPH NODE BIOPSY (Right)  SURGEON:  Surgeon(s) and Role:    * [name], MD - Primary  PHYSICIAN ASSISTANT:   ASSISTANTS: none   ANESTHESIA:   local and general  EBL:  minimal   BLOOD ADMINISTERED:none  DRAINS: none   LOCAL MEDICATIONS USED:  MARCAINE     SPECIMEN:  Source of Specimen:  Right breast tissue and sentinel nodes X 4  DISPOSITION OF SPECIMEN:  PATHOLOGY  COUNTS:  YES  TOURNIQUET:  * No tourniquets in log *  DICTATION: .Dragon Dictation   After informed consent was obtained the patient was brought to the operating room and placed in the supine position on the operating table.  After adequate induction of general anesthesia the patient's right chest, breast, and axillary area were prepped with ChloraPrep, allowed to dry, and draped in usual sterile manner.  An appropriate timeout was performed.  Previously an I-125 seed was placed far in the upper outer quadrant of the right breast to mark an area of invasive breast cancer.  Earlier in the day the patient underwent injection of 1 mCi of technetium sulfur colloid in the subareolar position on the right.  The neoprobe was used to check both the radioactivity in the axilla and the radioactive seed and they were very close in proximity to each other.  Because of this I chose to make one curvilinear incision near the axilla with a 15 blade knife.  The incision was carried through the skin and subcutaneous tissue sharply with electrocautery.  This area was infiltrated with quarter percent Marcaine.  The neoprobe was set to technetium in the dissection was carried into the deep right axillary space under the direction of the neoprobe.  I was  able to identify 4 hot lymph nodes.  The each of these was excised sharply with electrocautery and the lymphatics and small vessels were controlled with clips.  Ex vivo counts on these nodes ranged from 200 to 2000.  These were sent to pathology as sentinel nodes numbers 1 through 4.  No other hot or palpable nodes were identified in the right axilla.  The neoprobe was then set to I-125.  The area of the radioactive seed was readily identified.  A circular portion of breast tissue was excised sharply around the radioactive seed while checking the area of radioactivity frequently.  Once the specimen was removed it was oriented with the appropriate paint colors.  A specimen radiograph was obtained that showed the clip and seed to be near the center of the specimen.  There were some calcifications identified near the inferior margin so an additional inferior margin was taken and marked appropriately.  Hemostasis was achieved using the Bovie electrocautery.  The wound was irrigated with saline and infiltrated with quarter percent Marcaine.  The cavity was marked with clips.  The deep layer of the wound was then closed with layers of interrupted 3-0 Vicryl stitches.  The skin was then closed with a running 4-0 Monocryl subcuticular stitch.  Dermabond dressings were applied.  The patient tolerated the procedure well.  At the end of the case all needle sponge and instrument counts were correct.  The patient was then awakened and taken to recovery in stable condition.  PLAN  OF CARE: Discharge to home after PACU  PATIENT DISPOSITION:  PACU - hemodynamically stable.   Delay start of Pharmacological VTE agent (>24hrs) due to surgical blood loss or risk of bleeding: not applicable

## 2018-03-08 NOTE — Interval H&P Note (Signed)
History and Physical Interval Note:  03/08/2018 9:55 AM  Marie [name]  has presented today for surgery, with the diagnosis of right breast cancer  The various methods of treatment have been discussed with the patient and family. After consideration of risks, benefits and other options for treatment, the patient has consented to  Procedure(s): BREAST LUMPECTOMY WITH RADIOACTIVE SEED AND SENTINEL LYMPH NODE BIOPSY (Right) as a surgical intervention .  The patient's history has been reviewed, patient examined, no change in status, stable for surgery.  I have reviewed the patient's chart and labs.  Questions were answered to the patient's satisfaction.     TOTH III,Hala Narula S

## 2018-03-08 NOTE — Progress Notes (Signed)
Assisted Dr. Foster with right, ultrasound guided, pectoralis block. Side rails up, monitors on throughout procedure. See vital signs in flow sheet. Tolerated Procedure well. 

## 2018-03-08 NOTE — Transfer of Care (Signed)
Immediate Anesthesia Transfer of Care Note  Patient: Marie [name]  Procedure(s) Performed: BREAST LUMPECTOMY WITH RADIOACTIVE SEED AND SENTINEL LYMPH NODE BIOPSY (Right Breast)  Patient Location: PACU  Anesthesia Type:GA combined with regional for post-op pain  Level of Consciousness: drowsy and patient cooperative  Airway & Oxygen Therapy: Patient Spontanous Breathing and Patient connected to face mask oxygen  Post-op Assessment: Report given to RN and Post -op Vital signs reviewed and stable  Post vital signs: Reviewed and stable  Last Vitals:  Vitals Value Taken Time  BP    Temp    Pulse 90 03/08/2018 11:25 AM  Resp 22 03/08/2018 11:25 AM  SpO2 100 % 03/08/2018 11:25 AM  Vitals shown include unvalidated device data.  Last Pain:  Vitals:   03/08/18 0741  TempSrc: Oral         Complications: No apparent anesthesia complications

## 2018-03-08 NOTE — Anesthesia Preprocedure Evaluation (Signed)
Anesthesia Evaluation  Patient identified by MRN, date of birth, ID band Patient awake    Reviewed: Allergy & Precautions, NPO status , Patient's Chart, lab work & pertinent test results  Airway Mallampati: II  TM Distance: >3 FB Neck ROM: Full    Dental no notable dental hx. (+) Teeth Intact   Pulmonary neg pulmonary ROS,    Pulmonary exam normal breath sounds clear to auscultation       Cardiovascular hypertension, Pt. on medications Normal cardiovascular exam Rhythm:Regular Rate:Normal     Neuro/Psych PSYCHIATRIC DISORDERS Anxiety Depression Dementia    GI/Hepatic negative GI ROS, Neg liver ROS,   Endo/Other  diabetes, Well Controlled, Type 2, Oral Hypoglycemic AgentsHyperlipidemia Right Breast Ca  Renal/GU Renal InsufficiencyRenal disease  negative genitourinary   Musculoskeletal negative musculoskeletal ROS (+)   Abdominal (+) + obese,   Peds  Hematology negative hematology ROS (+)   Anesthesia Other Findings   Reproductive/Obstetrics                             Anesthesia Physical Anesthesia Plan  ASA: III  Anesthesia Plan: General   Post-op Pain Management:  Regional for Post-op pain   Induction: Intravenous  PONV Risk Score and Plan: 4 or greater and Ondansetron, Dexamethasone and Treatment may vary due to age or medical condition  Airway Management Planned: LMA  Additional Equipment:   Intra-op Plan:   Post-operative Plan: Extubation in OR  Informed Consent: I have reviewed the patients History and Physical, chart, labs and discussed the procedure including the risks, benefits and alternatives for the proposed anesthesia with the patient or authorized representative who has indicated his/her understanding and acceptance.   Dental advisory given  Plan Discussed with: CRNA and Surgeon  Anesthesia Plan Comments: (No midazolam)        Anesthesia Quick  Evaluation

## 2018-03-08 NOTE — Anesthesia Procedure Notes (Signed)
Anesthesia Regional Block: Pectoralis block   Pre-Anesthetic Checklist: ,, timeout performed, Correct Patient, Correct Site, Correct Laterality, Correct Procedure, Correct Position, site marked, Risks and benefits discussed,  Surgical consent,  Pre-op evaluation,  At surgeon's request and post-op pain management  Laterality: Right  Prep: chloraprep       Needles:  Injection technique: Single-shot  Needle Type: Echogenic Stimulator Needle     Needle Length: 10cm  Needle Gauge: 21   Needle insertion depth: 7 cm   Additional Needles:   Procedures:,,,, ultrasound used (permanent image in chart),,,,  Narrative:  Start time: 03/08/2018 8:40 AM End time: 03/08/2018 8:45 AM Injection made incrementally with aspirations every 5 mL.  Performed by: Personally  Anesthesiologist: Josephine Igo, MD  Additional Notes: Timeout performed. Patient sedated. Relevant anatomy ID'd using [place]. Incremental 2-50ml injection of LA with frequent aspiration. Patient tolerated procedure well.        Right Pectoralis Block

## 2018-03-09 ENCOUNTER — Encounter (HOSPITAL_BASED_OUTPATIENT_CLINIC_OR_DEPARTMENT_OTHER): Payer: Self-pay | Admitting: General Surgery

## 2018-03-19 NOTE — Progress Notes (Signed)
Location of Breast Cancer: RIGHT breast, UOQ  Histology per Pathology Report: 03/08/18: 1. Lymph node, sentinel, biopsy, Right axilla #1 - ONE OF ONE LYMPH NODES NEGATIVE FOR CARCINOMA (0/1). 2. Lymph node, sentinel, biopsy, right axilla #2 - ONE OF ONE LYMPH NODES NEGATIVE FOR CARCINOMA (0/1). 3. Lymph node, sentinel, biopsy, right axilla #3 - ONE OF ONE LYMPH NODES NEGATIVE FOR CARCINOMA (0/1). 4. Lymph node, sentinel, biopsy, right axilla #4 - ONE OF ONE LYMPH NODES NEGATIVE FOR CARCINOMA (0/1). 5. 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 - INVASIVE CARICNOMA IS 0.3 CM OF THE POSTERIOR MARGIN FOCALLY. - IN SITU CARCINOMA COMES TO WITHIN 0.2 CM OF THE ANTERIOR MARGIN FOCALLY. - BIOPSY SITE. - SEE ONCOLOGY TABLE. 6. Breast, excision, Right additional inferior margin - BENIGN BREAST TISSUE.   Receptor Status: ER(100%), PR (80%), HER2 (negative), Ki-(10%)  Did patient present with symptoms (if so, please note symptoms) or was this found on screening mammography?: screening mammogram  Past/Anticipated interventions by surgeon, if any: 03/08/18: PROCEDURE:  Procedure(s): RIGHT BREAST LUMPECTOMY WITH RADIOACTIVE SEED LOCALIZATION AND DEEP RIGHT AXILLARY SENTINEL LYMPH NODE BIOPSY (Right)  SURGEON:  Surgeon(s) and Role:    Marie Kussmaul, MD - Primary   Past/Anticipated interventions by medical oncology, if any: Chemotherapy Per Dr. Jana [name] 02/21/18:  The overall plan then is to start with surgery, to be followed by adjuvant radiation, and then antiestrogens.  I will plan to see Marie [name] in about 2 to 3 months to discuss antiestrogen options.  Lymphedema issues, if any:  Pt denies  Pain issues, if any:  Pt c/o occasional "soreness" in axilla and at incision site.   SAFETY ISSUES:  Prior radiation? No  Pacemaker/ICD? No  Possible current pregnancy? N/A, pt is  post-menopausal  Is the patient on methotrexate? No  Current Complaints / other details:  Pt presents for consult with Dr. Sondra [name] for Radiation Oncology. Pt is accompanied by husband. Pt is most distressed with children and grandchildren living with her and husband. Pt finds most relaxation is with her dog.     Marie Sousa, RN 03/22/2018,2:56 PM

## 2018-03-22 ENCOUNTER — Ambulatory Visit
Admission: RE | Admit: 2018-03-22 | Discharge: 2018-03-22 | Disposition: A | Discharge status: Home / Self Care | Payer: Medicare Other | Source: Ambulatory Visit | Attending: Radiation Oncology | Admitting: Radiation Oncology

## 2018-03-22 ENCOUNTER — Other Ambulatory Visit: Payer: Self-pay

## 2018-03-22 ENCOUNTER — Encounter: Payer: Self-pay | Admitting: Radiation Oncology

## 2018-03-22 VITALS — BP 125/67 | HR 95 | Temp 98.1°F | Resp 18 | Ht 66.0 in | Wt 151.1 lb

## 2018-03-22 DIAGNOSIS — Z880 Allergy status to penicillin: Secondary | ICD-10-CM | POA: Insufficient documentation

## 2018-03-22 DIAGNOSIS — Z170 Estrogen receptor positive status [ER+]: Secondary | ICD-10-CM | POA: Insufficient documentation

## 2018-03-22 DIAGNOSIS — C50411 Malignant neoplasm of upper-outer quadrant of right female breast: Secondary | ICD-10-CM | POA: Diagnosis not present

## 2018-03-22 DIAGNOSIS — Z79899 Other long term (current) drug therapy: Secondary | ICD-10-CM | POA: Insufficient documentation

## 2018-03-22 NOTE — Progress Notes (Signed)
Radiation Oncology         [phone] ________________________________  Name: Marie [name] MRN: [id number]  Date: 03/22/2018  DOB: 03/18/1951  Re-Evaluation Note  CC: [name], MD  Autumn Messing III, MD    ICD-10-CM   1. Malignant neoplasm of upper-outer quadrant of right breast in female, estrogen receptor positive ([place]) C50.411    Z17.0     Diagnosis:   67 y.o. female with Clinical Stage 1, cT1b Nx Mx, Right Breast, UOQ, Invasive Ductal Carcinoma, ER (+), PR (+), HER2 (-), grade 2, Pathologic Stage pT1bN0  Narrative:  The patient returns today with her husband for further discussion of adjuvant radiotherapy after undergoing right breast lumpectomy and sentinel lymph node biopsy with Dr. Marlou Starks on 03/08/18. Final pathology revealed grade 2 invasive ductal carcinoma, spanning 0.9 cm, ER positive/PR positive/Her2 negative/Ki-67 10%, and intermediate grade DCIS with necrosis and calcifications. Margins were negative; invasive carcinoma was 0.3 cm of the posterior margin focally, and in situ carcinoma came to within 0.2 cm of the anterior margin focally. All right axillary lymph nodes were negative (0/4).    On review of systems, the patient reports occasional soreness in her right axilla and at the incision site. She denies taking any pain medication. She states that she has been stretching her right arm above her head to help relieve the pain. She denies any lymphedema issues.                     ALLERGIES:  is allergic to penicillins.  Meds: Current Outpatient Medications  Medication Sig Dispense Refill  . atorvastatin (LIPITOR) 80 MG tablet TAKE ONE TABLET BY MOUTH AT BEDTIME FOR CHOLESTEROL 90 tablet 0  . Blood Glucose Monitoring Suppl (ONETOUCH VERIO) w/Device KIT 1 kit by Does not apply route as needed. Use to check glucose QD DX-E11.22 1 kit 0  . cetirizine (ZYRTEC) 10 MG tablet Take 10 mg by mouth at bedtime.    . cholecalciferol (VITAMIN D) 1000 units tablet Take 1,000  Units by mouth daily.    . diphenhydrAMINE (BENADRYL) 25 MG tablet Take by mouth.    . DULoxetine (CYMBALTA) 60 MG capsule Take 1 capsule (60 mg total) by mouth daily. 90 capsule 3  . glucose blood test strip CHECK BLOOD SUGAR 1 TIME DAILY-DX e11.22 100 each 12  . HYDROcodone-acetaminophen (NORCO/VICODIN) 5-325 MG tablet Take 1-2 tablets by mouth every 6 (six) hours as needed for moderate pain or severe pain. 15 tablet 0  . memantine (NAMENDA XR) 28 MG CP24 24 hr capsule Take 28 mg by mouth.    . Multiple Vitamin (MULTIVITAMIN) tablet Take 1 tablet by mouth daily.    . ONE TOUCH LANCETS MISC Use as directed to check glucose daily. DX-E11.22 100 each 1  . traZODone (DESYREL) 50 MG tablet     . ondansetron (ZOFRAN) 8 MG tablet Take 1/2 to 1 tablet 2 to 3 x / day if needed for nausea / vomiting (Patient not taking: Reported on 02/21/2018) 30 tablet 2   No current facility-administered medications for this encounter.     Physical Findings: The patient is in no acute distress. Patient is alert and oriented.  height is _0  (1.676 m) and weight is 151 lb 2 oz (68.5 kg). Her oral temperature is 98.1 F (36.7 C). Her blood pressure is 125/67 and her pulse is 95. Her respiration is 18 and oxygen saturation is 100%.   Lungs are clear to auscultation bilaterally. Heart  has regular rate and rhythm. No palpable cervical, supraclavicular, or axillary adenopathy. Abdomen soft, non-tender, normal bowel sounds.   Right Breast: Well-healing scar in the upper outer quadrant without signs of drainage or infection. No separate scar is noted from her axillary node procedure.  Left Breast not examined.  Lab Findings: Lab Results  Component Value Date   WBC 6.8 02/21/2018   HGB 12.5 02/21/2018   HCT 36.2 02/21/2018   MCV 89.7 02/21/2018   PLT 279 02/21/2018    Radiographic Findings: Nm Sentinel Node Inj-no Rpt (breast)  Result Date: 03/08/2018 Sulfur colloid was injected by the nuclear medicine  technologist for melanoma sentinel node.   Mm Breast Surgical Specimen  Result Date: 03/08/2018 CLINICAL DATA:  67 year old patient had ultrasound-guided localization with a radioactive seed on 03/07/2018 prior to lumpectomy. EXAM: SPECIMEN RADIOGRAPH OF THE RIGHT BREAST COMPARISON:  Previous exam(s). FINDINGS: Status post excision of the right breast. The radioactive seed and ribbon biopsy marker clip are present, completely intact, and were marked for pathology. Additionally, I asked the staff in the operating room to mark microcalcifications seen in the specimen for pathology. IMPRESSION: Specimen radiograph of the right breast. Electronically Signed   By: Curlene Dolphin M.D.   On: 03/08/2018 10:56   [place] Rt Radioactive Seed Loc  Result Date: 03/07/2018 CLINICAL DATA:  67 year old female for radioactive seed localization of RIGHT breast cancer prior to lumpectomy EXAM: ULTRASOUND GUIDED RADIOACTIVE SEED LOCALIZATION OF THE RIGHT BREAST DIAGNOSTIC RIGHT MAMMOGRAM POST ULTRASOUND-GUIDED RADIOACTIVE SEED PLACEMENT COMPARISON:  Previous exam(s). FINDINGS: Patient presents for radioactive seed localization prior to RIGHT lumpectomy. I met with the patient and we discussed the procedure of seed localization including benefits and alternatives. We discussed the high likelihood of a successful procedure. We discussed the risks of the procedure including infection, bleeding, tissue injury and further surgery. We discussed the low dose of radioactivity involved in the procedure. Informed, written consent was given. The usual time-out protocol was performed immediately prior to the procedure. Using ultrasound guidance, sterile technique, 1% lidocaine and an I-125 radioactive seed, the 6 mm mass at the 10 o'clock position of the RIGHT breast was localized using a LATERAL approach. The follow-up mammogram images confirm the seed in the expected location adjacent to the RIBBON clip and were marked for Dr. Marlou Starks. Follow-up  survey of the patient confirms presence of the radioactive seed. Order number of I-125 seed:  [id number]. Total activity:  1.749 millicuries.  Reference Date: 02/28/2018 The patient tolerated the procedure well and was released from the [facility]. She was given instructions regarding seed removal. IMPRESSION: Radioactive seed localization RIGHT breast with appropriate positioning of the seed adjacent to the RIBBON clip. No apparent complications. Electronically Signed   By: Margarette Canada M.D.   On: 03/07/2018 15:58   Mm Clip Placement Left  Result Date: 03/07/2018 CLINICAL DATA:  67 year old female for radioactive seed localization of RIGHT breast cancer prior to lumpectomy EXAM: ULTRASOUND GUIDED RADIOACTIVE SEED LOCALIZATION OF THE RIGHT BREAST DIAGNOSTIC RIGHT MAMMOGRAM POST ULTRASOUND-GUIDED RADIOACTIVE SEED PLACEMENT COMPARISON:  Previous exam(s). FINDINGS: Patient presents for radioactive seed localization prior to RIGHT lumpectomy. I met with the patient and we discussed the procedure of seed localization including benefits and alternatives. We discussed the high likelihood of a successful procedure. We discussed the risks of the procedure including infection, bleeding, tissue injury and further surgery. We discussed the low dose of radioactivity involved in the procedure. Informed, written consent was given. The usual time-out protocol was performed  immediately prior to the procedure. Using ultrasound guidance, sterile technique, 1% lidocaine and an I-125 radioactive seed, the 6 mm mass at the 10 o'clock position of the RIGHT breast was localized using a LATERAL approach. The follow-up mammogram images confirm the seed in the expected location adjacent to the RIBBON clip and were marked for Dr. Marlou Starks. Follow-up survey of the patient confirms presence of the radioactive seed. Order number of I-125 seed:  [id number]. Total activity:  2.035 millicuries.  Reference Date: 02/28/2018 The patient tolerated the  procedure well and was released from the [facility]. She was given instructions regarding seed removal. IMPRESSION: Radioactive seed localization RIGHT breast with appropriate positioning of the seed adjacent to the RIBBON clip. No apparent complications. Electronically Signed   By: Margarette Canada M.D.   On: 03/07/2018 15:58    Impression:  Clinical Stage 1, cT1b Nx Mx, Right Breast, UOQ, Invasive Ductal Carcinoma, ER (+), PR (+), HER2 (-), grade 2, Pathologic Stage pT1bN0. Patient would be a good candidate for breast conservation with radiation directed at the right breast. Today, I talked to the patient and her husband about the findings and work-up thus far.  We discussed the natural history of breast cancer and general treatment, highlighting the role of radiotherapy in the management.  We discussed the available radiation techniques, and focused on the details of logistics and delivery.  We reviewed the anticipated acute and late sequelae associated with radiation in this setting.  The patient was encouraged to ask questions that I answered to the best of my ability.  A patient consent form was discussed and signed.  We retained a copy for our records.  The patient would like to proceed with radiation and will be scheduled for CT simulation.  Medical oncology is not planning on any adjuvant chemotherapy given her good prognosis. She will proceed with adjuvant hormonal therapy after completion of her radiation treatments.  Plan:  Patient is scheduled for CT simulation on 04/05/18 with treatment to begin approximately one week later. Will plan on using hypo-fractionated accelerated radiation therapy if technically possible.  ____________________________________  Blair Promise, PhD, MD  This document serves as a record of services personally performed by Gery Pray, MD. It was created on his behalf by Rae Lips, a trained medical scribe. The creation of this record is based on the scribe's  personal observations and the provider's statements to them. This document has been checked and approved by the attending provider.

## 2018-03-25 ENCOUNTER — Other Ambulatory Visit: Payer: Self-pay | Admitting: Oncology

## 2018-03-29 ENCOUNTER — Other Ambulatory Visit: Payer: Self-pay

## 2018-03-29 ENCOUNTER — Ambulatory Visit: Payer: Medicare Other | Attending: General Surgery | Admitting: Physical Therapy

## 2018-03-29 ENCOUNTER — Encounter: Payer: Self-pay | Admitting: Physical Therapy

## 2018-03-29 DIAGNOSIS — Z170 Estrogen receptor positive status [ER+]: Secondary | ICD-10-CM | POA: Diagnosis present

## 2018-03-29 DIAGNOSIS — C50411 Malignant neoplasm of upper-outer quadrant of right female breast: Secondary | ICD-10-CM | POA: Diagnosis present

## 2018-03-29 DIAGNOSIS — Z483 Aftercare following surgery for neoplasm: Secondary | ICD-10-CM | POA: Diagnosis present

## 2018-03-29 DIAGNOSIS — R293 Abnormal posture: Secondary | ICD-10-CM | POA: Insufficient documentation

## 2018-03-29 NOTE — Therapy (Signed)
[place], [place], [address] Phone: [phone]   Fax:  [phone]  Physical Therapy Treatment  Patient Details  Name: Marie [name] MRN: [id number] Date of Birth: 07-01-1950 Referring Provider (PT): Dr. Autumn Messing   Encounter Date: 03/29/2018  PT End of Session - 03/29/18 1139    Visit Number  2    Number of Visits  2    PT Start Time  6659    PT Stop Time  1140    PT Time Calculation (min)  35 min    Activity Tolerance  Patient tolerated treatment well    Behavior During Therapy  [facility] for tasks assessed/performed       Past Medical History:  Diagnosis Date  . Dementia ([place])   . Depression   . Diabetes mellitus   . Hypertension   . Thyroid disease   . Type II or unspecified type diabetes mellitus without mention of complication, not stated as uncontrolled    stopped metformin due to diarrhea, no meds now  . Vitamin D deficiency     Past Surgical History:  Procedure Laterality Date  . BREAST LUMPECTOMY WITH RADIOACTIVE SEED AND SENTINEL LYMPH NODE BIOPSY Right 03/08/2018   Procedure: BREAST LUMPECTOMY WITH RADIOACTIVE SEED AND SENTINEL LYMPH NODE BIOPSY;  Surgeon: [name], MD;  Location: [facility];  Service: General;  Laterality: Right;  . FRACTURE SURGERY  2009   right arm; rod  . TOTAL KNEE ARTHROPLASTY  june 2013   left    There were no vitals filed for this visit.  Subjective Assessment - 03/29/18 1110    Subjective  Patient has her husband present to answer questions as her memory is poor. He reports she underwent a right lumpectomy and sentinel node biopsy (0/4 nodes positive) on 03/08/18. She will not have chemotherapy but will begin radiation sometime mid October.     Pertinent History  Patient was diagnosed on 02/01/18 with right grade II invasive ductal carcinoma breast cancer. She underwent a right lumpectomy and sentinel node biopsy (0/4 nodes positive) on  03/08/18. It is ER/PR positive and HER2 negative with a Ki67 of 10%. Her husband reports she after her knee surgery 2 years ago, she has no short term memory.     Patient Stated Goals  See if my arm is ok    Currently in Pain?  No/denies         [facility] PT Assessment - 03/29/18 0001      Assessment   Medical Diagnosis  Right breast cancer s/p right lumpectomy and SLNB    Referring Provider (PT)  Dr. Autumn Messing    Onset Date/Surgical Date  03/08/18    Hand Dominance  Right    Prior Therapy  Baseline assessment      Precautions   Precautions  Other (comment)    Precaution Comments  recent breast surgery      Restrictions   Weight Bearing Restrictions  No      Balance Screen   Has the patient fallen in the past 6 months  No    Has the patient had a decrease in activity level because of a fear of falling?   No    Is the patient reluctant to leave their home because of a fear of falling?   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      Posture/Postural Control   Posture/Postural Control  Postural limitations    Postural Limitations  Rounded Shoulders;Forward head      ROM / Strength   AROM / PROM / Strength  AROM      AROM   AROM Assessment Site  Shoulder    Right/Left Shoulder  Right    Right Shoulder Extension  56  Degrees    Right Shoulder Flexion  139 Degrees    Right Shoulder ABduction  157 Degrees    Right Shoulder Internal Rotation  81 Degrees    Right Shoulder External Rotation  88 Degrees        LYMPHEDEMA/ONCOLOGY QUESTIONNAIRE - 03/29/18 1118      Type   Cancer Type  Right breast      Surgeries   Lumpectomy Date  03/08/18    Sentinel Lymph Node Biopsy Date  03/08/18    Number Lymph Nodes Removed  4      Treatment   Active Chemotherapy Treatment  No    Past Chemotherapy Treatment  No    Active Radiation Treatment  No    Past Radiation Treatment  No    Current Hormone Treatment  No    Past Hormone Therapy  No      What other symptoms do you have   Are you Having Heaviness or Tightness  No    Are you having Pain  No    Are you having pitting edema  No    Is it Hard or Difficult finding clothes that fit  No    Do you have infections  No    Is there Decreased scar mobility  No    Stemmer Sign  No      Lymphedema Assessments   Lymphedema Assessments  Upper extremities      Right Upper Extremity Lymphedema   10 cm Proximal to Olecranon Process  23.5 cm    Olecranon Process  22.2 cm    10 cm Proximal to Ulnar Styloid Process  19.4 cm    Just Proximal to Ulnar Styloid Process  14.7 cm    Across Hand at [employer]  17.9 cm    At [place] of 2nd Digit  6.2 cm      Left Upper Extremity Lymphedema   10 cm Proximal to Olecranon Process  24 cm    Olecranon Process  22 cm    10 cm Proximal to Ulnar Styloid Process  18.7 cm    Just Proximal to Ulnar Styloid Process  14.9 cm    Across Hand at [employer]  17.5 cm    At [place] of 2nd Digit  6 cm        Quick Dash - 03/29/18 0001    Open a tight or  new jar  No difficulty    Do heavy household chores (wash walls, wash floors)  No difficulty    Carry a shopping bag or briefcase  No difficulty    Wash your back  No difficulty    Use a knife to cut food  No difficulty    Recreational activities in which you take some force or  impact through your arm, shoulder, or hand (golf, hammering, tennis)  No difficulty    During the past week, to what extent has your arm, shoulder or hand problem interfered with your normal social activities with family, friends, neighbors, or groups?  Not at all    During the past week, to what extent has your arm, shoulder or hand problem limited your work or other regular daily activities  Not at all    Arm, shoulder, or hand pain.  None    Tingling (pins and needles) in your arm, shoulder, or hand  None    Difficulty Sleeping  No difficulty    DASH Score  0 %                     PT Education - 03/29/18 1138    Education Details  Educated pt and her husband on getting a lymphedema alert bracelet due to her memory issues; gave him a website for a place that sells them    Person(s) Educated  Patient;Spouse    Methods  Explanation    Comprehension  Verbalized understanding          PT Long Term Goals - 03/29/18 1141      PT LONG TERM GOAL #1   Title  Patient will demonstrate she has regained full shoulder ROM and function post operatively compared with baseline.    Time  8    Period  Weeks    Status  Achieved            Plan - 03/29/18 1139    Clinical Impression Statement  Patient appears to be back to baseline following her right lumpectomy and sentinel node biopsy on 03/08/18. Shoulder ROM and function is back to baseline and pt reports no limitation. Her husband was present during sessio as she has cognitive deficits and memory impairments. There is no sign of lymphedema and her incision is well healed. No need for PT at this time.    PT Treatment/Interventions  ADLs/Self Care Home Management;Therapeutic exercise;Patient/family education    PT Next Visit Plan  D/C    PT Home Exercise Plan  Post op shoulder ROM HEP    Consulted and Agree with Plan of Care  Patient;Family member/caregiver    Family Member Consulted  Husband       Patient will benefit from  skilled therapeutic intervention in order to improve the following deficits and impairments:  Decreased knowledge of precautions, Impaired UE functional use, Postural dysfunction, Decreased range of motion  Visit Diagnosis: Malignant neoplasm of upper-outer quadrant of right breast in female, estrogen receptor positive ([place])  Abnormal posture  Aftercare following surgery for neoplasm     Problem List Patient Active Problem List   Diagnosis Date Noted  . Malignant neoplasm of upper-outer quadrant of right breast in female, estrogen receptor positive ([place]) 02/13/2018  . CKD stage 3 due to type 2 diabetes mellitus ([place]) 10/04/2017  . Body mass index (BMI) of 24.0-24.9 in adult 05/05/2015  . Generalized anxiety disorder 08/04/2014  . Essential hypertension 04/28/2014  . Medication management 04/28/2014  .  Vitamin D deficiency 10/04/2013  . Type 2 diabetes mellitus treated without insulin ([place])   . Depression, controlled   . SDAT    . Hyperlipidemia 07/28/2007  . Allergic rhinitis 07/28/2007     PHYSICAL THERAPY DISCHARGE SUMMARY  Visits from Start of Care: 2  Current functional level related to goals / functional outcomes: See above; goals met.   Remaining deficits: None   Education / Equipment: HEP and lymphedema education Plan: Patient agrees to discharge.  Patient goals were met. Patient is being discharged due to meeting the stated rehab goals.  ?????         Annia Friendly, [place] 03/29/18 11:42 AM   [place], [place], [address] Phone: [phone]   Fax:  [phone]  Name: Marie [name] MRN: [id number] Date of Birth: 01-10-1951

## 2018-04-05 ENCOUNTER — Ambulatory Visit
Admission: RE | Admit: 2018-04-05 | Discharge: 2018-04-05 | Disposition: A | Discharge status: Home / Self Care | Payer: Medicare Other | Source: Ambulatory Visit | Attending: Radiation Oncology | Admitting: Radiation Oncology

## 2018-04-05 DIAGNOSIS — Z510 Encounter for antineoplastic radiation therapy: Secondary | ICD-10-CM | POA: Insufficient documentation

## 2018-04-05 DIAGNOSIS — C50411 Malignant neoplasm of upper-outer quadrant of right female breast: Secondary | ICD-10-CM | POA: Insufficient documentation

## 2018-04-05 DIAGNOSIS — Z170 Estrogen receptor positive status [ER+]: Secondary | ICD-10-CM | POA: Insufficient documentation

## 2018-04-05 NOTE — Progress Notes (Signed)
  Radiation Oncology         [phone] ________________________________  Name: Marie [name] MRN: [id number]  Date: 04/05/2018  DOB: 02-26-1951  SIMULATION AND TREATMENT PLANNING NOTE    ICD-10-CM   1. Malignant neoplasm of upper-outer quadrant of right breast in female, estrogen receptor positive ([place]) C50.411    Z17.0     DIAGNOSIS:  ClinicalStage1,cT1b Nx Mx,RightBreast, UOQ, Invasive Ductal Carcinoma, ER(+), PR (+), HER2(-), grade2, Pathologic Stage pT1bN0  NARRATIVE:  The patient was brought to the [facility].  Identity was confirmed.  All relevant records and images related to the planned course of therapy were reviewed.  The patient freely provided informed written consent to proceed with treatment after reviewing the details related to the planned course of therapy. The consent form was witnessed and verified by the simulation staff.  Then, the patient was set-up in a stable reproducible  supine position for radiation therapy.  CT images were obtained.  Surface markings were placed.  The CT images were loaded into the planning software.  Then the target and avoidance structures were contoured.  Treatment planning then occurred.  The radiation prescription was entered and confirmed.  Then, I designed and supervised the construction of a total of 3 medically necessary complex treatment devices.  I have requested : 3D Simulation  I have requested a DVH of the following structures: heart, lungs, lumpectomy cavity.  I have ordered:dose calc.  PLAN:  The patient will receive 40.05 Gy in 15 fractions followed by a boost to the lumpectomy cavity of 10 gray in 5 fractions unless dosimetric planning does not allow for hypofractionated treatment.   Optical Surface Tracking Plan:  Since intensity modulated radiotherapy (IMRT) and 3D conformal radiation treatment methods are predicated on accurate and precise positioning for treatment, intrafraction motion monitoring  is medically necessary to ensure accurate and safe treatment delivery.  The ability to quantify intrafraction motion without excessive ionizing radiation dose can only be performed with optical surface tracking. Accordingly, surface imaging offers the opportunity to obtain 3D measurements of patient position throughout IMRT and 3D treatments without excessive radiation exposure.  I am ordering optical surface tracking for this patient's upcoming course of radiotherapy. ________________________________  -----------------------------------  Blair Promise, PhD, MD  This document serves as a record of services personally performed by Gery Pray, MD. It was created on his behalf by Wilburn Mylar, a trained medical scribe. The creation of this record is based on the scribe's personal observations and the provider's statements to them. This document has been checked and approved by the attending provider.

## 2018-04-06 ENCOUNTER — Telehealth: Payer: Self-pay

## 2018-04-06 NOTE — Telephone Encounter (Signed)
Contacted pt's husband to convey prescription for bra fitting had been faxed to [facility], with successful transmission. Husband verbalized understanding and agreement. Loma Sousa, RN BSN

## 2018-04-10 DIAGNOSIS — Z510 Encounter for antineoplastic radiation therapy: Secondary | ICD-10-CM | POA: Diagnosis not present

## 2018-04-12 ENCOUNTER — Ambulatory Visit
Admission: RE | Admit: 2018-04-12 | Discharge: 2018-04-12 | Disposition: A | Discharge status: Home / Self Care | Payer: Medicare Other | Source: Ambulatory Visit | Attending: Radiation Oncology | Admitting: Radiation Oncology

## 2018-04-12 DIAGNOSIS — Z170 Estrogen receptor positive status [ER+]: Principal | ICD-10-CM

## 2018-04-12 DIAGNOSIS — Z510 Encounter for antineoplastic radiation therapy: Secondary | ICD-10-CM | POA: Diagnosis not present

## 2018-04-12 DIAGNOSIS — C50411 Malignant neoplasm of upper-outer quadrant of right female breast: Secondary | ICD-10-CM

## 2018-04-12 NOTE — Progress Notes (Signed)
  Radiation Oncology         [phone] ________________________________  Name: Marie [name] MRN: [id number]  Date: 04/12/2018  DOB: 08-17-1950  Simulation Verification Note    ICD-10-CM   1. Malignant neoplasm of upper-outer quadrant of right breast in female, estrogen receptor positive ([name]) C50.411    Z17.0     Status: outpatient  NARRATIVE: The patient was brought to the treatment unit and placed in the planned treatment position. The clinical setup was verified. Then port films were obtained and uploaded to the radiation oncology medical record software.  The treatment beams were carefully compared against the planned radiation fields. The position location and shape of the radiation fields was reviewed. They targeted volume of tissue appears to be appropriately covered by the radiation beams. Organs at risk appear to be excluded as planned.  Based on my personal review, I approved the simulation verification. The patient's treatment will proceed as planned.  -----------------------------------  Blair Promise, PhD, MD

## 2018-04-16 ENCOUNTER — Ambulatory Visit
Admission: RE | Admit: 2018-04-16 | Discharge: 2018-04-16 | Disposition: A | Discharge status: Home / Self Care | Payer: Medicare Other | Source: Ambulatory Visit | Attending: Radiation Oncology | Admitting: Radiation Oncology

## 2018-04-16 DIAGNOSIS — Z510 Encounter for antineoplastic radiation therapy: Secondary | ICD-10-CM | POA: Diagnosis not present

## 2018-04-17 ENCOUNTER — Ambulatory Visit
Admission: RE | Admit: 2018-04-17 | Discharge: 2018-04-17 | Disposition: A | Discharge status: Home / Self Care | Payer: Medicare Other | Source: Ambulatory Visit | Attending: Radiation Oncology | Admitting: Radiation Oncology

## 2018-04-17 DIAGNOSIS — C50411 Malignant neoplasm of upper-outer quadrant of right female breast: Secondary | ICD-10-CM

## 2018-04-17 DIAGNOSIS — Z170 Estrogen receptor positive status [ER+]: Principal | ICD-10-CM

## 2018-04-17 DIAGNOSIS — Z510 Encounter for antineoplastic radiation therapy: Secondary | ICD-10-CM | POA: Diagnosis not present

## 2018-04-17 MED ORDER — RADIAPLEXRX EX GEL
Freq: Two times a day (BID) | CUTANEOUS | Status: DC
  Administered 2018-04-17: 14:00:00 via TOPICAL

## 2018-04-17 MED ORDER — ALRA NON-METALLIC DEODORANT (RAD-ONC)
1.0000 "application " | Freq: Once | TOPICAL | Status: AC
  Administered 2018-04-17: 1 via TOPICAL

## 2018-04-18 ENCOUNTER — Ambulatory Visit
Admission: RE | Admit: 2018-04-18 | Discharge: 2018-04-18 | Disposition: A | Discharge status: Home / Self Care | Payer: Medicare Other | Source: Ambulatory Visit | Attending: Radiation Oncology | Admitting: Radiation Oncology

## 2018-04-18 DIAGNOSIS — Z510 Encounter for antineoplastic radiation therapy: Secondary | ICD-10-CM | POA: Diagnosis not present

## 2018-04-19 ENCOUNTER — Ambulatory Visit
Admission: RE | Admit: 2018-04-19 | Discharge: 2018-04-19 | Disposition: A | Discharge status: Home / Self Care | Payer: Medicare Other | Source: Ambulatory Visit | Attending: Radiation Oncology | Admitting: Radiation Oncology

## 2018-04-19 DIAGNOSIS — Z510 Encounter for antineoplastic radiation therapy: Secondary | ICD-10-CM | POA: Diagnosis not present

## 2018-04-20 ENCOUNTER — Ambulatory Visit
Admission: RE | Admit: 2018-04-20 | Discharge: 2018-04-20 | Disposition: A | Discharge status: Home / Self Care | Payer: Medicare Other | Source: Ambulatory Visit | Attending: Radiation Oncology | Admitting: Radiation Oncology

## 2018-04-20 DIAGNOSIS — Z510 Encounter for antineoplastic radiation therapy: Secondary | ICD-10-CM | POA: Diagnosis not present

## 2018-04-23 ENCOUNTER — Encounter: Payer: Self-pay | Admitting: Physician Assistant

## 2018-04-23 ENCOUNTER — Ambulatory Visit: Payer: Medicare Other | Admitting: Physician Assistant

## 2018-04-23 ENCOUNTER — Ambulatory Visit
Admission: RE | Admit: 2018-04-23 | Discharge: 2018-04-23 | Disposition: A | Discharge status: Home / Self Care | Payer: Medicare Other | Source: Ambulatory Visit | Attending: Radiation Oncology | Admitting: Radiation Oncology

## 2018-04-23 VITALS — BP 136/78 | HR 101 | Temp 98.6°F | Ht 65.0 in | Wt 151.4 lb

## 2018-04-23 DIAGNOSIS — I10 Essential (primary) hypertension: Secondary | ICD-10-CM | POA: Diagnosis not present

## 2018-04-23 DIAGNOSIS — E119 Type 2 diabetes mellitus without complications: Secondary | ICD-10-CM

## 2018-04-23 DIAGNOSIS — N183 Chronic kidney disease, stage 3 (moderate): Secondary | ICD-10-CM

## 2018-04-23 DIAGNOSIS — C50411 Malignant neoplasm of upper-outer quadrant of right female breast: Secondary | ICD-10-CM

## 2018-04-23 DIAGNOSIS — E782 Mixed hyperlipidemia: Secondary | ICD-10-CM | POA: Diagnosis not present

## 2018-04-23 DIAGNOSIS — Z79899 Other long term (current) drug therapy: Secondary | ICD-10-CM

## 2018-04-23 DIAGNOSIS — E1122 Type 2 diabetes mellitus with diabetic chronic kidney disease: Secondary | ICD-10-CM | POA: Diagnosis not present

## 2018-04-23 DIAGNOSIS — F3341 Major depressive disorder, recurrent, in partial remission: Secondary | ICD-10-CM

## 2018-04-23 DIAGNOSIS — Z170 Estrogen receptor positive status [ER+]: Secondary | ICD-10-CM

## 2018-04-23 DIAGNOSIS — Z510 Encounter for antineoplastic radiation therapy: Secondary | ICD-10-CM | POA: Diagnosis not present

## 2018-04-23 NOTE — Progress Notes (Signed)
FOLLOW UP  Assessment and Plan:   Hypertension -Continue medication, monitor blood pressure at home. Continue DASH diet.  Reminder to go to the ER if any CP, SOB, nausea, dizziness, severe HA, changes vision/speech, left arm numbness and tingling and jaw pain.  Cholesterol -Continue diet and exercise. Check cholesterol.   Vitamin D Def - check level and continue medications.   Type 2 diabetes mellitus treated without insulin (HCC) -     Hemoglobin A1c Discussed disease progression and risks Discussed diet/exercise, weight management and risk modification Diet controlled  CKD stage 3 due to type 2 diabetes mellitus ([place]) -     COMPLETE METABOLIC PANEL WITH GFR Discussed general issues about diabetes pathophysiology and management., Educational material distributed., Suggested low cholesterol diet., Encouraged aerobic exercise., Discussed foot care., Reminded to get yearly retinal exam.  Depression, major, recurrent, in partial remission ([place]) - continue medications, stress management techniques discussed, increase water, good sleep hygiene discussed, increase exercise, and increase veggies.   Medication management -     Magnesium  Malignant neoplasm of upper-outer quadrant of right breast in female, estrogen receptor positive ([place]) Continue follow up with Dr. Griffith Citron, tolerating radiation well.   Continue diet and meds as discussed. Further disposition pending results of labs. Discussed med's effects and SE's.   Over 30 minutes of exam, counseling, chart review, and critical decision making was performed Future Appointments  Date Time Provider [place]  04/23/2018  2:30 PM Vicie Mutters, PA-C [facility] None  04/24/2018 11:30 AM [facility] LINAC 3 [facility] None  04/25/2018 11:30 AM [facility] LINAC 3 [facility] None  04/26/2018 11:30 AM [facility] LINAC 3 [facility] None  04/27/2018 11:30 AM [facility] LINAC 3 [facility] None  04/30/2018 11:30 AM [facility]  LINAC 3 [facility] None  05/01/2018 11:30 AM [facility] LINAC 3 [facility] None  05/02/2018 11:30 AM [facility] LINAC 3 [facility] None  05/03/2018 11:30 AM [facility] LINAC 3 [facility] None  05/03/2018  1:00 PM Magrinat, Virgie Dad, MD [facility] None  05/04/2018 11:30 AM [facility] LINAC 3 [facility] None  05/07/2018 11:30 AM [facility] LINAC 3 [facility] None  05/08/2018 11:30 AM [facility] LINAC 3 [facility] None  05/09/2018 11:30 AM [facility] LINAC 3 [facility] None  05/10/2018 11:30 AM [facility] LINAC 3 [facility] None  05/11/2018 11:30 AM [facility] LINAC 3 [facility] None  07/25/2018  2:00 PM [name], MD [facility] None  10/15/2018  2:00 PM Liane Comber, NP [facility] None     HPI 67 y.o. female  presents for 3 month follow up on hypertension, cholesterol, diabetes and vitamin D deficiency.   Patient is being followed by Dr. Griffith Citron, for right breast upper outer quadrant found on overdue MGM, biopsy on 02/09/2018 for a clinical T1b N0, stage IA invasive ductal carcinoma, grade 2, estrogen and progesterone receptor positive, HER-2 not amplified, with an MIB-1 of 10%. She is going to pursue breast conserving therapy with breast lumpectomy and radiation.   Her blood pressure has been controlled at home, today their BP is BP: 136/78   She does workout, she is walking her dog. She denies chest pain, shortness of breath, dizziness.  BMI is Body mass index is 25.19 kg/m., she is working on diet and exercise. Wt Readings from Last 3 Encounters:  04/23/18 151 lb 6.4 oz (68.7 kg)  03/22/18 151 lb 2 oz (68.5 kg)  03/08/18 147 lb 11.3 oz (67 kg)    She is on cholesterol medication. She is on lipitor 80 mg and denies myalgias. Her cholesterol is not at goal. The  cholesterol last visit was:   Lab Results  Component Value Date   CHOL 198 01/09/2018   HDL 51 01/09/2018   LDLCALC 124 (H) 01/09/2018   TRIG 120 01/09/2018   CHOLHDL 3.9 01/09/2018   She has  been working on diet and exercise for Diabetes with diabetic chronic kidney disease, she is not on bASA, she is not on ACE/ARB, had eye exam with Dr. Delman Cheadle in June and denies paresthesia of the feet, polydipsia, polyuria and visual disturbances. Last A1C was:  Lab Results  Component Value Date   HGBA1C 5.9 (H) 01/09/2018   Patient is on Vitamin D supplement, she is on 2000 a day.   Lab Results  Component Value Date   VD25OH 27 (L) 01/09/2018      Current Medications:  Current Outpatient Medications on File Prior to Visit  Medication Sig  . atorvastatin (LIPITOR) 80 MG tablet TAKE ONE TABLET BY MOUTH AT BEDTIME FOR CHOLESTEROL  . Blood Glucose Monitoring Suppl (ONETOUCH VERIO) w/Device KIT 1 kit by Does not apply route as needed. Use to check glucose QD DX-E11.22  . cetirizine (ZYRTEC) 10 MG tablet Take 10 mg by mouth at bedtime.  . cholecalciferol (VITAMIN D) 1000 units tablet Take 1,000 Units by mouth daily.  . diphenhydrAMINE (BENADRYL) 25 MG tablet Take by mouth.  . DULoxetine (CYMBALTA) 60 MG capsule Take 1 capsule (60 mg total) by mouth daily.  Marland Kitchen glucose blood test strip CHECK BLOOD SUGAR 1 TIME DAILY-DX e11.22  . HYDROcodone-acetaminophen (NORCO/VICODIN) 5-325 MG tablet Take 1-2 tablets by mouth every 6 (six) hours as needed for moderate pain or severe pain.  . memantine (NAMENDA XR) 28 MG CP24 24 hr capsule Take 28 mg by mouth.  . Multiple Vitamin (MULTIVITAMIN) tablet Take 1 tablet by mouth daily.  . ondansetron (ZOFRAN) 8 MG tablet Take 1/2 to 1 tablet 2 to 3 x / day if needed for nausea / vomiting  . ONE TOUCH LANCETS MISC Use as directed to check glucose daily. [id number]  . traZODone (DESYREL) 50 MG tablet    No current facility-administered medications on file prior to visit.     Medical History:  Past Medical History:  Diagnosis Date  . Dementia ([place])   . Depression   . Diabetes mellitus   . Hypertension   . Thyroid disease   . Type II or unspecified type diabetes  mellitus without mention of complication, not stated as uncontrolled    stopped metformin due to diarrhea, no meds now  . Vitamin D deficiency    Allergies:  Allergies  Allergen Reactions  . Penicillins Rash    Swelling in face - last 10 years     Review of Systems:  Review of Systems  Constitutional: Negative.   HENT: Negative.   Eyes: Negative.   Respiratory: Negative.   Cardiovascular: Negative.   Gastrointestinal: Negative.   Genitourinary: Negative.   Musculoskeletal: Negative.   Skin: Negative.   Neurological: Negative.   Endo/Heme/Allergies: Negative.   Psychiatric/Behavioral: Negative.     Family history- Review and unchanged Social history- Review and unchanged Physical Exam: BP 136/78   Pulse (!) 101   Temp 98.6 F (37 C)   Ht 5' 5"  (1.651 m)   Wt 151 lb 6.4 oz (68.7 kg)   SpO2 98%   BMI 25.19 kg/m  Wt Readings from Last 3 Encounters:  04/23/18 151 lb 6.4 oz (68.7 kg)  03/22/18 151 lb 2 oz (68.5 kg)  03/08/18 147 lb 11.3 oz (  67 kg)   General Appearance: Well nourished, in no apparent distress. Eyes: PERRLA, EOMs, conjunctiva no swelling or erythema Sinuses: No Frontal/maxillary tenderness ENT/Mouth: Ext aud canals clear, TMs without erythema, bulging. No erythema, swelling, or exudate on post pharynx.  Tonsils not swollen or erythematous. Hearing normal.  Neck: Supple, thyroid normal.  Respiratory: Respiratory effort normal, BS equal bilaterally without rales, rhonchi, wheezing or stridor.  Cardio: RRR with no MRGs. Brisk peripheral pulses without edema.  Abdomen: Soft, + BS.  Non tender, no guarding, rebound, hernias, masses. Lymphatics: Non tender without lymphadenopathy.  Musculoskeletal: Full ROM, 5/5 strength, Normal gait Skin: Warm, dry without rashes, lesions, ecchymosis.  Neuro: Cranial nerves intact. No cerebellar symptoms.  Psych: Awake and oriented X 3, normal affect, Insight and Judgment appropriate.    Vicie Mutters, PA-C 1:54  PM [facility]

## 2018-04-23 NOTE — Patient Instructions (Addendum)
VITAMIN D IS IMPORTANT  Vitamin D goal is between 60-80  Please make sure that you are taking your Vitamin D as directed.   It is very important as a natural anti-inflammatory   helping hair, skin, and nails, as well as reducing stroke and heart attack risk.   It helps your bones and helps with mood.  We want you on at least 5000 IU daily  It also decreases numerous cancer risks so please take it as directed.   Low Vit D is associated with a 200-300% higher risk for CANCER   and 200-300% higher risk for HEART   ATTACK  &  STROKE.    .....................................Marland Kitchen  It is also associated with higher death rate at younger ages,   autoimmune diseases like Rheumatoid arthritis, Lupus, Multiple Sclerosis.     Also many other serious conditions, like depression, Alzheimer's  Dementia, infertility, muscle aches, fatigue, fibromyalgia - just to name a few.  +++++++++++++++++++  Can get liquid vitamin D from [place] here in [place] at  [facility] alternatives [address], [place], [place] [address] Or you can try earth fare   Go get your flu shot from a pharmacy     When it comes to diets, agreement about the perfect plan isn't easy to find, even among the experts. Experts at the [employer] developed an idea known as the Healthy Eating Plate. Just imagine a plate divided into logical, healthy portions.  The emphasis is on diet quality:  Load up on vegetables and fruits - one-half of your plate: Aim for color and variety, and remember that potatoes don't count.  Go for whole grains - one-quarter of your plate: Whole wheat, barley, wheat berries, quinoa, oats, brown rice, and foods made with them. If you want pasta, go with whole wheat pasta.  Protein power - one-quarter of your plate: Fish, chicken, beans, and nuts are all healthy, versatile protein sources. Limit red meat.  The diet, however, does go beyond the plate, offering a few other  suggestions.  Use healthy plant oils, such as olive, canola, soy, corn, sunflower and peanut. Check the labels, and avoid partially hydrogenated oil, which have unhealthy trans fats.  If you're thirsty, drink water. Coffee and tea are good in moderation, but skip sugary drinks and limit milk and dairy products to one or two daily servings.  The type of carbohydrate in the diet is more important than the amount. Some sources of carbohydrates, such as vegetables, fruits, whole grains, and beans-are healthier than others.  Finally, stay active.

## 2018-04-24 ENCOUNTER — Other Ambulatory Visit: Payer: Self-pay | Admitting: Physician Assistant

## 2018-04-24 ENCOUNTER — Ambulatory Visit
Admission: RE | Admit: 2018-04-24 | Discharge: 2018-04-24 | Disposition: A | Discharge status: Home / Self Care | Payer: Medicare Other | Source: Ambulatory Visit | Attending: Radiation Oncology | Admitting: Radiation Oncology

## 2018-04-24 DIAGNOSIS — Z510 Encounter for antineoplastic radiation therapy: Secondary | ICD-10-CM | POA: Diagnosis not present

## 2018-04-24 LAB — CBC WITH DIFFERENTIAL/PLATELET
BASOS ABS: 43 {cells}/uL (ref 0–200)
Basophils Relative: 0.7 %
Eosinophils Absolute: 128 cells/uL (ref 15–500)
Eosinophils Relative: 2.1 %
HEMATOCRIT: 37.5 % (ref 35.0–45.0)
HEMOGLOBIN: 13.2 g/dL (ref 11.7–15.5)
LYMPHS ABS: 1092 {cells}/uL (ref 850–3900)
MCH: 31.9 pg (ref 27.0–33.0)
MCHC: 35.2 g/dL (ref 32.0–36.0)
MCV: 90.6 fL (ref 80.0–100.0)
MPV: 9.2 fL (ref 7.5–12.5)
Monocytes Relative: 5.1 %
NEUTROS PCT: 74.2 %
Neutro Abs: 4526 cells/uL (ref 1500–7800)
Platelets: 273 10*3/uL (ref 140–400)
RBC: 4.14 10*6/uL (ref 3.80–5.10)
RDW: 12.2 % (ref 11.0–15.0)
Total Lymphocyte: 17.9 %
WBC: 6.1 10*3/uL (ref 3.8–10.8)
WBCMIX: 311 {cells}/uL (ref 200–950)

## 2018-04-24 LAB — HEMOGLOBIN A1C
HEMOGLOBIN A1C: 6.2 %{Hb} — AB (ref <5.7)
Mean Plasma Glucose: 131 (calc)
eAG (mmol/L): 7.3 (calc)

## 2018-04-24 LAB — COMPLETE METABOLIC PANEL WITH GFR
AG Ratio: 1.9 (calc) (ref 1.0–2.5)
ALT: 12 U/L (ref 6–29)
AST: 15 U/L (ref 10–35)
Albumin: 4.2 g/dL (ref 3.6–5.1)
Alkaline phosphatase (APISO): 77 U/L (ref 33–130)
BILIRUBIN TOTAL: 0.7 mg/dL (ref 0.2–1.2)
BUN/Creatinine Ratio: 12 (calc) (ref 6–22)
BUN: 15 mg/dL (ref 7–25)
CHLORIDE: 103 mmol/L (ref 98–110)
CO2: 31 mmol/L (ref 20–32)
Calcium: 9.7 mg/dL (ref 8.6–10.4)
Creat: 1.24 mg/dL — ABNORMAL HIGH (ref 0.50–0.99)
GFR, EST AFRICAN AMERICAN: 52 mL/min/{1.73_m2} — AB (ref >=60)
GFR, Est Non African American: 45 mL/min/{1.73_m2} — ABNORMAL LOW (ref >=60)
GLUCOSE: 269 mg/dL — AB (ref 65–99)
Globulin: 2.2 g/dL (calc) (ref 1.9–3.7)
Potassium: 4.1 mmol/L (ref 3.5–5.3)
Sodium: 142 mmol/L (ref 135–146)
TOTAL PROTEIN: 6.4 g/dL (ref 6.1–8.1)

## 2018-04-24 LAB — MAGNESIUM: Magnesium: 1.8 mg/dL (ref 1.5–2.5)

## 2018-04-24 LAB — LIPID PANEL
CHOLESTEROL: 231 mg/dL — AB (ref <200)
HDL: 54 mg/dL (ref >50)
LDL CHOLESTEROL (CALC): 146 mg/dL — AB
Non-HDL Cholesterol (Calc): 177 mg/dL (calc) — ABNORMAL HIGH (ref <130)
TRIGLYCERIDES: 174 mg/dL — AB (ref <150)
Total CHOL/HDL Ratio: 4.3 (calc) (ref <5.0)

## 2018-04-24 LAB — TSH: TSH: 2.08 mIU/L (ref 0.40–4.50)

## 2018-04-24 MED ORDER — ROSUVASTATIN CALCIUM 20 MG PO TABS: 20.0000 mg | ORAL_TABLET | Freq: Every day | ORAL | 1 refills | Status: DC

## 2018-04-25 ENCOUNTER — Ambulatory Visit
Admission: RE | Admit: 2018-04-25 | Discharge: 2018-04-25 | Disposition: A | Discharge status: Home / Self Care | Payer: Medicare Other | Source: Ambulatory Visit | Attending: Radiation Oncology | Admitting: Radiation Oncology

## 2018-04-25 DIAGNOSIS — Z510 Encounter for antineoplastic radiation therapy: Secondary | ICD-10-CM | POA: Diagnosis not present

## 2018-04-26 ENCOUNTER — Ambulatory Visit
Admission: RE | Admit: 2018-04-26 | Discharge: 2018-04-26 | Disposition: A | Discharge status: Home / Self Care | Payer: Medicare Other | Source: Ambulatory Visit | Attending: Radiation Oncology | Admitting: Radiation Oncology

## 2018-04-26 DIAGNOSIS — Z510 Encounter for antineoplastic radiation therapy: Secondary | ICD-10-CM | POA: Diagnosis not present

## 2018-04-27 ENCOUNTER — Ambulatory Visit
Admission: RE | Admit: 2018-04-27 | Discharge: 2018-04-27 | Disposition: A | Discharge status: Home / Self Care | Payer: Medicare Other | Source: Ambulatory Visit | Attending: Radiation Oncology | Admitting: Radiation Oncology

## 2018-04-27 DIAGNOSIS — Z170 Estrogen receptor positive status [ER+]: Secondary | ICD-10-CM | POA: Diagnosis present

## 2018-04-27 DIAGNOSIS — C50411 Malignant neoplasm of upper-outer quadrant of right female breast: Secondary | ICD-10-CM | POA: Diagnosis not present

## 2018-04-30 ENCOUNTER — Ambulatory Visit
Admission: RE | Admit: 2018-04-30 | Discharge: 2018-04-30 | Disposition: A | Discharge status: Home / Self Care | Payer: Medicare Other | Source: Ambulatory Visit | Attending: Radiation Oncology | Admitting: Radiation Oncology

## 2018-04-30 DIAGNOSIS — C50411 Malignant neoplasm of upper-outer quadrant of right female breast: Secondary | ICD-10-CM | POA: Diagnosis not present

## 2018-05-01 ENCOUNTER — Ambulatory Visit
Admission: RE | Admit: 2018-05-01 | Discharge: 2018-05-01 | Disposition: A | Discharge status: Home / Self Care | Payer: Medicare Other | Source: Ambulatory Visit | Attending: Radiation Oncology | Admitting: Radiation Oncology

## 2018-05-01 DIAGNOSIS — C50411 Malignant neoplasm of upper-outer quadrant of right female breast: Secondary | ICD-10-CM | POA: Diagnosis not present

## 2018-05-02 ENCOUNTER — Ambulatory Visit
Admission: RE | Admit: 2018-05-02 | Discharge: 2018-05-02 | Disposition: A | Discharge status: Home / Self Care | Payer: Medicare Other | Source: Ambulatory Visit | Attending: Radiation Oncology | Admitting: Radiation Oncology

## 2018-05-02 DIAGNOSIS — C50411 Malignant neoplasm of upper-outer quadrant of right female breast: Secondary | ICD-10-CM | POA: Diagnosis not present

## 2018-05-02 NOTE — Progress Notes (Signed)
[facility]  [phone] [phone]     ID: Marie [name] DOB: [birth year]  MR#: [id number]  HGD#:[id number]  Patient Care Team: Marie Pinto, MD as PCP - General (Internal Medicine) Marie May, MD as Consulting Physician (Psychiatry) Marie [name], Marie Dad, MD as Consulting Physician (Oncology) Marie Kussmaul, MD as Consulting Physician (General Surgery) Marie Pray, MD as Consulting Physician (Radiation Oncology) OTHER MD:  CHIEF COMPLAINT: Estrogen receptor positive breast cancer  CURRENT TREATMENT: Adjuvant radiation   HISTORY OF CURRENT ILLNESS: From the original intake note:  Marie [name] had routine screening mammography on 02/01/2018 showing a possible abnormality in the right breast. She underwent unilateral right diagnostic mammography with tomography and right breast ultrasonography at [facility] on 02/09/2018 showing: breast density category C. There was a suspicious mass at the 1 o'clock upper inner quadrant of the right breast measuring 1.0 x 0.6 x 1.0 cm. Additionally there is a suspicious nodule at the 10 o'clock upper outer quadrant measuring 0.6 cm. There is no sonographic evidence of right axillary lymphadenopathy.  Accordingly on 02/09/2018 she proceeded to biopsy of the right breast area in question. The pathology from this procedure showed ([id number]) : At the 1 o' clock position there was benign breast tissue with fibrocystic change.  This was felt to be concordant by radiology.  At the 10 o'clock: Invasive ductal carcinoma, grade 2. Prognostic indicators significant for: estrogen receptor, 100% positive and progesterone receptor, 80% positive, both with strong staining intensity. Proliferation marker Ki67 at 10%. HER2 negative by IHC (0) .  The patient's subsequent history is as detailed below.  INTERVAL HISTORY: Marie [name] presents to the office today for follow up of her estrogen receptor positive breast  cancer. She is accompanied by her husband today.   Since her last visit to the office, she underwent a right lumpectomy and sentinel node biopsy ([id number]) on 03/08/2018 that showed: Lymph node, sentinel, biopsy, Right axilla #1 with one of one lymph nodes negative for carcinoma (0/1). Lymph node, sentinel, biopsy, right axilla #2 with one of one lymph nodes negative for carcinoma (0/1). Lymph node, sentinel, biopsy, right axilla #3 with one of one lymph nodes negative for carcinoma (0/1). Lymph node, sentinel, biopsy, right axilla #4 with one of one lymph nodes negative for carcinoma (0/1). Breast, lumpectomy, Right with invasive ductal carcinoma, grade 2, spanning 0.9 cm. 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 Invasive carcinoma is 0.3 cm of the posterior margin focally. In situ carcinoma comes to within 0.2 cm of the anterior margin focally. Biopsy site. Breast, excision, Right additional inferior margin with benign breast tissue.    She started adjuvant radiation on 04/16/2018 with anticipated end date of 05/11/2018. She notes that she is doing well overall with her radiation treatments. Her skin is doing fairly well at this time, however, she has started to have mild redness to the treatment site. She denies fatigue.    REVIEW OF SYSTEMS: Marie [name] reports that for exercise, she walks her dog and helps to bring in the trash. She had her pravastatin changed to crestor recently by her PCP due to her recent cholesterol labs. She denies unusual headaches, visual changes, nausea, vomiting, or dizziness. There has been no unusual cough, phlegm production, or pleurisy. This been no change in bowel or bladder habits. She denies unexplained fatigue or unexplained weight loss, bleeding, rash, or fever. A detailed review of systems was otherwise stable.      PAST MEDICAL HISTORY:  Past Medical History:  Diagnosis Date  . Dementia ([place])   . Depression   . Diabetes  mellitus   . Hypertension   . Thyroid disease   . Type II or unspecified type diabetes mellitus without mention of complication, not stated as uncontrolled    stopped metformin due to diarrhea, no meds now  . Vitamin D deficiency   Diabetes controlled with diet (previously not tolerated metformin due to diarrhea)  PAST SURGICAL HISTORY: Past Surgical History:  Procedure Laterality Date  . BREAST LUMPECTOMY WITH RADIOACTIVE SEED AND SENTINEL LYMPH NODE BIOPSY Right 03/08/2018   Procedure: BREAST LUMPECTOMY WITH RADIOACTIVE SEED AND SENTINEL LYMPH NODE BIOPSY;  Surgeon: Marie Kussmaul, MD;  Location: [facility];  Service: General;  Laterality: Right;  . FRACTURE SURGERY  2009   right arm; rod  . TOTAL KNEE ARTHROPLASTY  june 2013   left  Tonsillectomy  FAMILY HISTORY Family History  Problem Relation Age of Onset  . Colon cancer Neg Hx   . Stomach cancer Neg Hx   The patient's father died at age 37 due to dementia and kidney failure. The patient's mother died at age 30 due to emphysema. The patient had 1 brother and no sisters. The patient's mother was diagnosed with breast cancer in the 101's. She denies any other breast cancer history or any history of ovarian or prostate cancer in the family.   GYNECOLOGIC HISTORY:  No LMP recorded. Patient is postmenopausal. Menarche: 67 years old Age at first live birth: 67 years old She is GXP2.  Her LMP was in 1975. She did not use contraception or HRT.    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 Her husband Marie [name], " used to be a Pension scheme manager. The patient's daughter Marie [name] lives in [place] and is disabled due to a car accident and back issues. The patient's son, Marie [name] lives in [place] as a gas truck driver. The patient has 5 grandchildren and 1 great grandchild. She is not currently a church at [employer]     ADVANCED DIRECTIVES: The patient's husband is her 47.    HEALTH MAINTENANCE: Social  History   Tobacco Use  . Smoking status: Never Smoker  . Smokeless tobacco: Never Used  Substance Use Topics  . Alcohol use: No  . Drug use: No     Colonoscopy: October 2013, Marie [name]  PAP:  Bone density: 02/01/2018 showed a T score of -1.5 (osteopenia)   Allergies  Allergen Reactions  . Penicillins Rash    Swelling in face - last 10 years    Current Outpatient Medications  Medication Sig Dispense Refill  . Blood Glucose Monitoring Suppl (ONETOUCH VERIO) w/Device KIT 1 kit by Does not apply route as needed. Use to check glucose QD DX-E11.22 1 kit 0  . cetirizine (ZYRTEC) 10 MG tablet Take 10 mg by mouth at bedtime.    . cholecalciferol (VITAMIN D) 1000 units tablet Take 1,000 Units by mouth daily.    . diphenhydrAMINE (BENADRYL) 25 MG tablet Take by mouth.    . DULoxetine (CYMBALTA) 60 MG capsule Take 1 capsule (60 mg total) by mouth daily. 90 capsule 3  . glucose blood test strip CHECK BLOOD SUGAR 1 TIME DAILY-DX e11.22 100 each 12  . HYDROcodone-acetaminophen (NORCO/VICODIN) 5-325 MG tablet Take 1-2 tablets by mouth every 6 (six) hours as needed for moderate pain or severe pain. 15 tablet 0  . memantine (NAMENDA XR) 28 MG CP24 24 hr capsule  Take 28 mg by mouth.    . Multiple Vitamin (MULTIVITAMIN) tablet Take 1 tablet by mouth daily.    . ondansetron (ZOFRAN) 8 MG tablet Take 1/2 to 1 tablet 2 to 3 x / day if needed for nausea / vomiting 30 tablet 2  . ONE TOUCH LANCETS MISC Use as directed to check glucose daily. DX-E11.22 100 each 1  . rosuvastatin (CRESTOR) 20 MG tablet Take 1 tablet (20 mg total) by mouth at bedtime. 90 tablet 1  . tamoxifen (NOLVADEX) 20 MG tablet Take 1 tablet (20 mg total) by mouth daily. 90 tablet 12  . traZODone (DESYREL) 50 MG tablet      No current facility-administered medications for this visit.     OBJECTIVE: Middle-aged white [name] in no acute distress  Vitals:   05/03/18 1154  BP: 137/85  Pulse: 94  Resp: 18  Temp: 97.8 F (36.6  C)  SpO2: 100%     Body mass index is 24.65 kg/m.   Wt Readings from Last 3 Encounters:  05/03/18 148 lb 1.6 oz (67.2 kg)  04/23/18 151 lb 6.4 oz (68.7 kg)  03/22/18 151 lb 2 oz (68.5 kg)      ECOG FS:1 - Symptomatic but completely ambulatory  Sclerae unicteric, EOMs intact No cervical or supraclavicular adenopathy Lungs no rales or rhonchi Heart regular rate and rhythm Abd soft, nontender, positive bowel sounds MSK no focal spinal tenderness, no upper extremity lymphedema Neuro: nonfocal, mild memory deficits, positive affect Breasts: The right breast is status post lumpectomy and is currently receiving radiation.  There is erythema but no desquamation.  There is no evidence of residual or recurrent disease.  Left breast is unremarkable.  Both axillae are benign.  LAB RESULTS:  CMP     Component Value Date/Time   NA 142 04/23/2018 1401   K 4.1 04/23/2018 1401   CL 103 04/23/2018 1401   CO2 31 04/23/2018 1401   GLUCOSE 269 (H) 04/23/2018 1401   BUN 15 04/23/2018 1401   CREATININE 1.24 (H) 04/23/2018 1401   CALCIUM 9.7 04/23/2018 1401   PROT 6.4 04/23/2018 1401   ALBUMIN 3.6 02/21/2018 1117   AST 15 04/23/2018 1401   AST 20 02/21/2018 1117   ALT 12 04/23/2018 1401   ALT 17 02/21/2018 1117   ALKPHOS 73 02/21/2018 1117   BILITOT 0.7 04/23/2018 1401   BILITOT 0.9 02/21/2018 1117   GFRNONAA 45 (L) 04/23/2018 1401   GFRAA 52 (L) 04/23/2018 1401    No results found for: TOTALPROTELP, ALBUMINELP, A1GS, A2GS, BETS, BETA2SER, GAMS, MSPIKE, SPEI  No results found for: KPAFRELGTCHN, LAMBDASER, KAPLAMBRATIO  Lab Results  Component Value Date   WBC 6.1 04/23/2018   NEUTROABS 4,526 04/23/2018   HGB 13.2 04/23/2018   HCT 37.5 04/23/2018   MCV 90.6 04/23/2018   PLT 273 04/23/2018    _0 @  No results found for: LABCA2  No components found for: AJOINO676  No results for input(s): INR in the last 168 hours.  No results found for: LABCA2  No results found  for: HMC947  No results found for: SJG283  No results found for: MOQ947  No results found for: CA2729  No components found for: HGQUANT  No results found for: CEA1 / No results found for: CEA1   No results found for: AFPTUMOR  No results found for: CHROMOGRNA  No results found for: PSA1  No visits with results within 3 Day(s) from this visit.  Latest known visit with results is:  Office Visit on 04/23/2018  Component Date Value Ref Range Status  . HM Diabetic Eye Exam 10/04/2017 No Retinopathy  No Retinopathy Final  . WBC 04/23/2018 6.1  3.8 - 10.8 Thousand/uL Final  . RBC 04/23/2018 4.14  3.80 - 5.10 Million/uL Final  . Hemoglobin 04/23/2018 13.2  11.7 - 15.5 g/dL Final  . HCT 04/23/2018 37.5  35.0 - 45.0 % Final  . MCV 04/23/2018 90.6  80.0 - 100.0 fL Final  . MCH 04/23/2018 31.9  27.0 - 33.0 pg Final  . MCHC 04/23/2018 35.2  32.0 - 36.0 g/dL Final  . RDW 04/23/2018 12.2  11.0 - 15.0 % Final  . Platelets 04/23/2018 273  140 - 400 Thousand/uL Final  . MPV 04/23/2018 9.2  7.5 - 12.5 fL Final  . Neutro Abs 04/23/2018 4,526  1,500 - 7,800 cells/uL Final  . Lymphs Abs 04/23/2018 1,092  850 - 3,900 cells/uL Final  . WBC mixed population 04/23/2018 311  200 - 950 cells/uL Final  . Eosinophils Absolute 04/23/2018 128  15 - 500 cells/uL Final  . Basophils Absolute 04/23/2018 43  0 - 200 cells/uL Final  . Neutrophils Relative % 04/23/2018 74.2  % Final  . Total Lymphocyte 04/23/2018 17.9  % Final  . Monocytes Relative 04/23/2018 5.1  % Final  . Eosinophils Relative 04/23/2018 2.1  % Final  . Basophils Relative 04/23/2018 0.7  % Final  . Glucose, Bld 04/23/2018 269* 65 - 99 mg/dL Final   Comment: .            Fasting reference interval . For someone without known diabetes, a glucose value >125 mg/dL indicates that they [name] have diabetes and this should be confirmed with a follow-up test. .   . BUN 04/23/2018 15  7 - 25 mg/dL Final  . Creat 04/23/2018 1.24* 0.50 - 0.99  mg/dL Final   Comment: For patients >45 years of age, the reference limit for Creatinine is approximately 13% higher for people identified as African-American. .   . GFR, Est Non African American 04/23/2018 45* > OR = 60 mL/min/1.26m Final  . GFR, Est African American 04/23/2018 52* > OR = 60 mL/min/1.721mFinal  . BUN/Creatinine Ratio 04/23/2018 12  6 - 22 (calc) Final  . Sodium 04/23/2018 142  135 - 146 mmol/L Final  . Potassium 04/23/2018 4.1  3.5 - 5.3 mmol/L Final  . Chloride 04/23/2018 103  98 - 110 mmol/L Final  . CO2 04/23/2018 31  20 - 32 mmol/L Final  . Calcium 04/23/2018 9.7  8.6 - 10.4 mg/dL Final  . Total Protein 04/23/2018 6.4  6.1 - 8.1 g/dL Final  . Albumin 04/23/2018 4.2  3.6 - 5.1 g/dL Final  . Globulin 04/23/2018 2.2  1.9 - 3.7 g/dL (calc) Final  . AG Ratio 04/23/2018 1.9  1.0 - 2.5 (calc) Final  . Total Bilirubin 04/23/2018 0.7  0.2 - 1.2 mg/dL Final  . Alkaline phosphatase (APISO) 04/23/2018 77  33 - 130 U/L Final  . AST 04/23/2018 15  10 - 35 U/L Final  . ALT 04/23/2018 12  6 - 29 U/L Final  . TSH 04/23/2018 2.08  0.40 - 4.50 mIU/L Final  . Cholesterol 04/23/2018 231* <200 mg/dL Final  . HDL 04/23/2018 54  >50 mg/dL Final  . Triglycerides 04/23/2018 174* <150 mg/dL Final  . LDL Cholesterol (Calc) 04/23/2018 146* mg/dL (calc) Final   Comment: Reference range: <100 . Desirable range <100 mg/dL for primary prevention;   <70 mg/dL for  patients with CHD or diabetic patients  with > or = 2 CHD risk factors. Marland Kitchen LDL-C is now calculated using the Martin-Hopkins  calculation, which is a validated novel method providing  better accuracy than the Friedewald equation in the  estimation of LDL-C.  Cresenciano Genre et al. Marie [name]. 4580;998(33): 2061-2068  (http://education.[facility].com/faq/FAQ164)   . Total CHOL/HDL Ratio 04/23/2018 4.3  <5.0 (calc) Final  . Non-HDL Cholesterol (Calc) 04/23/2018 177* <130 mg/dL (calc) Final   Comment: For patients with diabetes plus 1  major ASCVD risk  factor, treating to a non-HDL-C goal of <100 mg/dL  (LDL-C of <70 mg/dL) is considered a therapeutic  option.   . Hgb A1c MFr Bld 04/23/2018 6.2* <5.7 % of total Hgb Final   Comment: For someone without known diabetes, a hemoglobin  A1c value between 5.7% and 6.4% is consistent with prediabetes and should be confirmed with a  follow-up test. . For someone with known diabetes, a value <7% indicates that their diabetes is well controlled. A1c targets should be individualized based on duration of diabetes, age, comorbid conditions, and other considerations. . This assay result is consistent with an increased risk of diabetes. . Currently, no consensus exists regarding use of hemoglobin A1c for diagnosis of diabetes for children. .   . Mean Plasma Glucose 04/23/2018 131  (calc) Final  . eAG (mmol/L) 04/23/2018 7.3  (calc) Final  . Magnesium 04/23/2018 1.8  1.5 - 2.5 mg/dL Final    (this displays the last labs from the last 3 days)  No results found for: TOTALPROTELP, ALBUMINELP, A1GS, A2GS, BETS, BETA2SER, GAMS, MSPIKE, SPEI (this displays SPEP labs)  No results found for: KPAFRELGTCHN, LAMBDASER, KAPLAMBRATIO (kappa/lambda light chains)  No results found for: HGBA, HGBA2QUANT, HGBFQUANT, HGBSQUAN (Hemoglobinopathy evaluation)   No results found for: LDH  No results found for: IRON, TIBC, IRONPCTSAT (Iron and TIBC)  No results found for: FERRITIN  Urinalysis    Component Value Date/Time   COLORURINE YELLOW 07/06/2017 [place] 07/06/2017 1514   LABSPEC 1.007 07/06/2017 1514   PHURINE 6.0 07/06/2017 [place] 07/06/2017 [place] 07/06/2017 [place] 06/08/2016 [place] 07/06/2017 [place] 07/06/2017 1514   UROBILINOGEN 0.2 07/16/2014 1212   NITRITE NEGATIVE 07/06/2017 1514   LEUKOCYTESUR NEGATIVE 07/06/2017 1514     STUDIES: No results  found.  ELIGIBLE FOR AVAILABLE RESEARCH PROTOCOL:  no ASSESSMENT: 67 y.o. Marie [name], Marie [name] status post right breast upper outer quadrant biopsy 02/09/2018 for a clinical T1b N0, stage IA invasive ductal carcinoma, grade 2, estrogen and progesterone receptor positive, HER-2 not amplified, with an MIB-1 of 10%  (1) status post right lumpectomy and sentinel lymph node sampling 03/08/2018 for a pT1b pN0, stage IA invasive ductal carcinoma, grade 2, with negative margins  (a) a total of 4 right axillary sentinel lymph nodes were removed  (b) given overall prognosis, no Oncotype obtained  (2) adjuvant radiation in process  (3) tamoxifen to start 06/27/2018  (a) Bone density: 02/01/2018 showed a T score of -1.5 (osteopenia)  PLAN: Marie [name] will be completing her local treatment next week as she completes her radiation.  She is now ready to start systemic therapy.  We had reviewed her prognosis earlier and decided against Oncotype so she did not receive chemotherapy.  She has an excellent prognosis without chemotherapy and likely would get very little benefit from it on clinical grounds  Accordingly she is  now ready to consider antiestrogens. We discussed the difference between tamoxifen and anastrozole in detail. She understands that anastrozole and the aromatase inhibitors in general work by blocking estrogen production. Accordingly vaginal dryness, decrease in bone density, and of course hot flashes can result. The aromatase inhibitors can also negatively affect the cholesterol profile, although that is a minor effect. One out of 5 women on aromatase inhibitors we will feel "old and achy". This arthralgia/myalgia syndrome, which resembles fibromyalgia clinically, does resolve with stopping the medications. Accordingly this is not a reason to not try an aromatase inhibitor but it is a frequent reason to stop it (in other words 20% of women will not be able to tolerate these  medications).  Tamoxifen on the other hand does not block estrogen production. It does not "take away a [name]'s estrogen". It blocks the estrogen receptor in breast cells. Like anastrozole, it can also cause hot flashes. As opposed to anastrozole, tamoxifen has many estrogen-like effects. It is technically an estrogen receptor modulator. This means that in some tissues tamoxifen works like estrogen-- for example it helps strengthen the bones. It tends to improve the cholesterol profile. It can cause thickening of the endometrial lining, and even endometrial polyps or rarely cancer of the uterus.(The risk of uterine cancer due to tamoxifen is one additional cancer per thousand women year). It can cause vaginal wetness or stickiness. It can cause blood clots through this estrogen-like effect--the risk of blood clots with tamoxifen is exactly the same as with birth control pills or hormone replacement.  Neither of these agents causes mood changes or weight gain, despite the popular belief that they can have these side effects. We have data from studies comparing either of these drugs with placebo, and in those cases the control group had the same amount of weight gain and depression as the group that took the drug.  After much discussion and with the patient's husband active participation we decided to go with tamoxifen, chiefly because of her prior osteopenia.  She will start this 06/27/2018.  I will see her 2 to 3 months later.  If she is tolerating it well the plan will be to continue for a total of 5 years, otherwise we will switch to anastrozole.  They know to call for any other issues that [name] develop before the next visit here.  Karmina Zufall, Marie Dad, MD  05/03/18 12:58 PM Medical Oncology and Hematology [facility] [address] [place], [place] [address] Tel. [phone]    Fax. [phone]    I, Soijett Blue am acting as scribe for Dr. Sarajane Jews C. Stasha Naraine.  I, Lurline Del MD, have reviewed the above documentation for accuracy and completeness, and I agree with the above.

## 2018-05-03 ENCOUNTER — Inpatient Hospital Stay: Payer: Medicare Other | Attending: Oncology | Admitting: Oncology

## 2018-05-03 ENCOUNTER — Telehealth: Payer: Self-pay | Admitting: Oncology

## 2018-05-03 ENCOUNTER — Ambulatory Visit
Admission: RE | Admit: 2018-05-03 | Discharge: 2018-05-03 | Disposition: A | Discharge status: Home / Self Care | Payer: Medicare Other | Source: Ambulatory Visit | Attending: Radiation Oncology | Admitting: Radiation Oncology

## 2018-05-03 VITALS — BP 137/85 | HR 94 | Temp 97.8°F | Resp 18 | Ht 65.0 in | Wt 148.1 lb

## 2018-05-03 DIAGNOSIS — M8580 Other specified disorders of bone density and structure, unspecified site: Secondary | ICD-10-CM

## 2018-05-03 DIAGNOSIS — C50411 Malignant neoplasm of upper-outer quadrant of right female breast: Secondary | ICD-10-CM | POA: Diagnosis not present

## 2018-05-03 DIAGNOSIS — Z79899 Other long term (current) drug therapy: Secondary | ICD-10-CM | POA: Diagnosis not present

## 2018-05-03 DIAGNOSIS — Z170 Estrogen receptor positive status [ER+]: Secondary | ICD-10-CM | POA: Diagnosis not present

## 2018-05-03 DIAGNOSIS — Z923 Personal history of irradiation: Secondary | ICD-10-CM | POA: Diagnosis not present

## 2018-05-03 DIAGNOSIS — I10 Essential (primary) hypertension: Secondary | ICD-10-CM

## 2018-05-03 MED ORDER — TAMOXIFEN CITRATE 20 MG PO TABS: 20.0000 mg | ORAL_TABLET | Freq: Every day | ORAL | 12 refills | Status: AC

## 2018-05-03 NOTE — Telephone Encounter (Signed)
Verified patient appointment with spouse.  Requested to mail calendar.

## 2018-05-04 ENCOUNTER — Ambulatory Visit
Admission: RE | Admit: 2018-05-04 | Discharge: 2018-05-04 | Disposition: A | Discharge status: Home / Self Care | Payer: Medicare Other | Source: Ambulatory Visit | Attending: Radiation Oncology | Admitting: Radiation Oncology

## 2018-05-04 DIAGNOSIS — C50411 Malignant neoplasm of upper-outer quadrant of right female breast: Secondary | ICD-10-CM | POA: Diagnosis not present

## 2018-05-07 ENCOUNTER — Ambulatory Visit
Admission: RE | Admit: 2018-05-07 | Discharge: 2018-05-07 | Disposition: A | Discharge status: Home / Self Care | Payer: Medicare Other | Source: Ambulatory Visit | Attending: Radiation Oncology | Admitting: Radiation Oncology

## 2018-05-07 DIAGNOSIS — C50411 Malignant neoplasm of upper-outer quadrant of right female breast: Secondary | ICD-10-CM | POA: Diagnosis not present

## 2018-05-08 ENCOUNTER — Ambulatory Visit
Admission: RE | Admit: 2018-05-08 | Discharge: 2018-05-08 | Disposition: A | Discharge status: Home / Self Care | Payer: Medicare Other | Source: Ambulatory Visit | Attending: Radiation Oncology | Admitting: Radiation Oncology

## 2018-05-08 DIAGNOSIS — Z170 Estrogen receptor positive status [ER+]: Principal | ICD-10-CM

## 2018-05-08 DIAGNOSIS — C50411 Malignant neoplasm of upper-outer quadrant of right female breast: Secondary | ICD-10-CM

## 2018-05-08 MED ORDER — SONAFINE EX EMUL
1.0000 "application " | Freq: Two times a day (BID) | CUTANEOUS | Status: DC
  Administered 2018-05-08: 1 via TOPICAL

## 2018-05-09 ENCOUNTER — Ambulatory Visit
Admission: RE | Admit: 2018-05-09 | Discharge: 2018-05-09 | Disposition: A | Discharge status: Home / Self Care | Payer: Medicare Other | Source: Ambulatory Visit | Attending: Radiation Oncology | Admitting: Radiation Oncology

## 2018-05-09 DIAGNOSIS — C50411 Malignant neoplasm of upper-outer quadrant of right female breast: Secondary | ICD-10-CM | POA: Diagnosis not present

## 2018-05-10 ENCOUNTER — Ambulatory Visit
Admission: RE | Admit: 2018-05-10 | Discharge: 2018-05-10 | Disposition: A | Discharge status: Home / Self Care | Payer: Medicare Other | Source: Ambulatory Visit | Attending: Radiation Oncology | Admitting: Radiation Oncology

## 2018-05-10 DIAGNOSIS — C50411 Malignant neoplasm of upper-outer quadrant of right female breast: Secondary | ICD-10-CM | POA: Diagnosis not present

## 2018-05-11 ENCOUNTER — Ambulatory Visit
Admission: RE | Admit: 2018-05-11 | Discharge: 2018-05-11 | Disposition: A | Discharge status: Home / Self Care | Payer: Medicare Other | Source: Ambulatory Visit | Attending: Radiation Oncology | Admitting: Radiation Oncology

## 2018-05-11 ENCOUNTER — Encounter: Payer: Self-pay | Admitting: *Deleted

## 2018-05-11 ENCOUNTER — Telehealth: Payer: Self-pay | Admitting: Oncology

## 2018-05-11 DIAGNOSIS — C50411 Malignant neoplasm of upper-outer quadrant of right female breast: Secondary | ICD-10-CM | POA: Diagnosis not present

## 2018-05-11 NOTE — Telephone Encounter (Signed)
Scheduled appt per 11/15 sch message - sent reminder letter in the mail with appt date and time

## 2018-06-11 ENCOUNTER — Encounter: Payer: Self-pay | Admitting: Radiation Oncology

## 2018-06-11 ENCOUNTER — Other Ambulatory Visit: Payer: Self-pay

## 2018-06-11 ENCOUNTER — Ambulatory Visit
Admission: RE | Admit: 2018-06-11 | Discharge: 2018-06-11 | Disposition: A | Discharge status: Home / Self Care | Payer: Medicare Other | Source: Ambulatory Visit | Attending: Radiation Oncology | Admitting: Radiation Oncology

## 2018-06-11 VITALS — BP 144/79 | HR 72 | Temp 97.6°F | Resp 18 | Ht 66.0 in | Wt 143.1 lb

## 2018-06-11 DIAGNOSIS — Z170 Estrogen receptor positive status [ER+]: Secondary | ICD-10-CM | POA: Diagnosis not present

## 2018-06-11 DIAGNOSIS — C50411 Malignant neoplasm of upper-outer quadrant of right female breast: Secondary | ICD-10-CM | POA: Insufficient documentation

## 2018-06-11 DIAGNOSIS — Z79899 Other long term (current) drug therapy: Secondary | ICD-10-CM | POA: Diagnosis not present

## 2018-06-11 NOTE — Progress Notes (Signed)
Marie [name] is here for a follow-up appointment today with Dr. Sondra Come.Patient denies any pain or fatigue. Patient skin is slightly hyperpigmented.Patient states that she has sharp shooting pains sometime. Vitals:   06/11/18 1106  BP: (!) 144/79  Pulse: 72  Resp: 18  Temp: 97.6 F (36.4 C)  TempSrc: Oral  SpO2: 100%  Weight: 143 lb 2 oz (64.9 kg)  Height: 5\' 6"  (1.676 m)   Wt Readings from Last 3 Encounters:  06/11/18 143 lb 2 oz (64.9 kg)  05/03/18 148 lb 1.6 oz (67.2 kg)  04/23/18 151 lb 6.4 oz (68.7 kg)

## 2018-06-11 NOTE — Progress Notes (Signed)
  Radiation Oncology         [phone] ________________________________  Name: Marie [name] MRN: [id number]  Date: 06/11/2018  DOB: 09/05/1950  End of Treatment Note  Diagnosis:   Clinical Stage 1, cT1b Nx Mx, Right Breast, UOQ, Invasive Ductal Carcinoma, ER (+), PR (+), HER2 (neg), grade 2    Indication for treatment:  Curative       Radiation treatment dates:   04/16/18-05/11/18  Site/dose:   1. Right breast - 40.05 Gy in 15 fractions of 2.67 Gy 2. Boost - 10 Gy in 5 fractions of 2 Gy  Beams/energy:   1. 3D, 10X, 6X         2. 3D, 6X, 10X  Narrative: The patient tolerated radiation treatment relatively well.     At the beginning of treatment, pt reported pinching in the right breast as well as mild fatigue. Towards the end of treatment, pt reported itching on top of her breast. Pt developed hyperpigmentation and a small open area underneath the breast. She was given Radiaplex, but did not use because she said it caused burning. Overall the pt was without complaints.   Plan: The patient has completed radiation treatment. The patient will return to radiation oncology clinic for routine followup in one month. I advised them to call or return sooner if they have any questions or concerns related to their recovery or treatment.  -----------------------------------  Blair Promise, PhD, MD  This document serves as a record of services personally performed by Gery Pray, MD. It was created on his behalf by Mary-Margaret Loma Messing, a trained medical scribe. The creation of this record is based on the scribe's personal observations and the provider's statements to them. This document has been checked and approved by the attending provider.

## 2018-06-11 NOTE — Progress Notes (Signed)
Radiation Oncology         [phone] ________________________________  Name: Marie [name] MRN: [id number]  Date: 06/11/2018  DOB: 28-Sep-1950  Follow-Up Visit Note  CC: [name], MD  [name], MD    ICD-10-CM   1. 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 Right breast - 40.05 Gy in 15 fractions of 2.67 Gy 2. Boost - 10 Gy in 5 fractions of 2 Gy  Narrative:  The patient returns today for routine follow-up.  she is doing well overall. She is accompanied by her husband.                 On review of systems, she reports associated right breast itching and swelling soon after tx completed, better now. She has occasional sharp pains in her breast. she denies constant breast pain and any other symptoms. Pertinent positives are listed and detailed within the above HPI.                 ALLERGIES:  is allergic to penicillins.  Meds: Current Outpatient Medications  Medication Sig Dispense Refill  . Blood Glucose Monitoring Suppl (ONETOUCH VERIO) w/Device KIT 1 kit by Does not apply route as needed. Use to check glucose QD DX-E11.22 1 kit 0  . cholecalciferol (VITAMIN D) 1000 units tablet Take 1,000 Units by mouth daily.    . DULoxetine (CYMBALTA) 60 MG capsule Take 1 capsule (60 mg total) by mouth daily. 90 capsule 3  . glucose blood test strip CHECK BLOOD SUGAR 1 TIME DAILY-DX e11.22 100 each 12  . memantine (NAMENDA XR) 28 MG CP24 24 hr capsule Take 28 mg by mouth.    . Multiple Vitamin (MULTIVITAMIN) tablet Take 1 tablet by mouth daily.    . ondansetron (ZOFRAN) 8 MG tablet Take 1/2 to 1 tablet 2 to 3 x / day if needed for nausea / vomiting 30 tablet 2  . ONE TOUCH LANCETS MISC Use as directed  to check glucose daily. DX-E11.22 100 each 1  . traZODone (DESYREL) 50 MG tablet     . cetirizine (ZYRTEC) 10 MG tablet Take 10 mg by mouth at bedtime.    . diphenhydrAMINE (BENADRYL) 25 MG tablet Take by mouth.    Marland Kitchen HYDROcodone-acetaminophen (NORCO/VICODIN) 5-325 MG tablet Take 1-2 tablets by mouth every 6 (six) hours as needed for moderate pain or severe pain. (Patient not taking: Reported on 06/11/2018) 15 tablet 0  . rosuvastatin (CRESTOR) 20 MG tablet Take 1 tablet (20 mg total) by mouth at bedtime. (Patient not taking: Reported on 06/11/2018) 90 tablet 1   No current facility-administered medications for this encounter.     Physical Findings: The patient is in no acute distress. Patient is alert and oriented.  height is 5' 6"  (1.676 m) and weight is 143 lb 2 oz (64.9 kg). Her oral temperature is 97.6 F (36.4 C). Her blood pressure is 144/79 (abnormal) and her pulse is 72. Her respiration is 18 and oxygen saturation is 100%. .  No significant changes. Lungs are clear to auscultation bilaterally. Heart has regular rate and rhythm. No palpable cervical, supraclavicular, or axillary adenopathy. Abdomen soft, non-tender, normal bowel sounds. Left breast with  no palpable mass, nipple discharge, or bleeding. Right breast pt has some hyperpigmentation changes throughout. Skin is well-healed. Some mild to moderate edema remaining   Lab Findings: Lab Results  Component Value Date   WBC 6.1 04/23/2018   HGB 13.2 04/23/2018   HCT 37.5 04/23/2018   MCV 90.6 04/23/2018   PLT 273 04/23/2018    Radiographic Findings: No results found.  Impression: Clinically stable status post-radiation therapy No evidence of recurrence on clinical exam.   Plan:  PRN follow-up in radiation oncology. We have requested follow-up with Dr. Jana Hakim to initiate adjuvant hormonal therapy.   ____________________________________   Blair Promise, PhD, MD This document serves as a record of services personally  performed by Gery Pray, MD. It was created on his behalf by Mary-Margaret Loma Messing, a trained medical scribe. The creation of this record is based on the scribe's personal observations and the provider's statements to them. This document has been checked and approved by the attending provider.

## 2018-07-24 ENCOUNTER — Encounter: Payer: Self-pay | Admitting: Internal Medicine

## 2018-07-24 NOTE — Progress Notes (Signed)
[facility] [name], M.D.     Marie [name]. Silverio Lay, P.A.-C Liane Comber, [place] [address] [place] [address] Telephone [phone] Telefax [phone] Annual Screening/Preventative Visit & Comprehensive Evaluation &  Examination     This very nice 68 y.o. MWF presents for a Screening /Preventative Visit & comprehensive evaluation and management of multiple medical co-morbidities.  Patient has been followed for HTN, HLD, T2_NIDDM  Prediabetes  and Vitamin D Deficiency.     Patient is followed by Dr Toy Care for Depression & she's on SS Disability for Depression & Dementia since 2015. She was evaluated by Dr Leta Baptist in 2012 for her Dementia & she requires moderate supervision by her caretaker husband.      HTN predates since 2000. Patient's BP has been controlled at home and patient denies any cardiac symptoms as chest pain, palpitations, shortness of breath, dizziness or ankle swelling. Today's BP is at goal - 130/76.      Patient's hyperlipidemia is controlled with diet and she has been non-compliant with taking prescribed Rosuvastatin. Patient denies myalgias or other medication SE's. Current  lipids are not at goal: Lab Results  Component Value Date   CHOL 283 (H) 07/25/2018   HDL 53 07/25/2018   LDLCALC 194 (H) 07/25/2018   TRIG 182 (H) 07/25/2018   CHOLHDL 5.3 (H) 07/25/2018      Patient has hx/o T2_NIDDM predating circa 2000 and she had been treated with Metformin stopped in fall 2018 because of worsening kidney functions (GFR 25) and with better fluid intake her GFR improved  (CKD 3 / GFR 45). With weight loss her A1c's have improved & ar near normal.  She denies reactive hypoglycemic symptoms, visual blurring, diabetic polys or paresthesias. Last A1c was not at goal:  Lab Results  Component Value Date   HGBA1C 5.9 (H) 07/25/2018      Patient has been on Thyroid replacement since  2010.     Finally, patient has history of Vitamin D Deficiency  ("29" / 2008)  and her compliance taking recommended doses of  Vitamin D is poor and her level is still low:  Lab Results  Component Value Date   VD25OH 40 07/25/2018   Current Outpatient Medications on File Prior to Visit  Medication Sig  . Blood Glucose Monitoring Suppl (ONETOUCH VERIO) w/Device KIT 1 kit by Does not apply route as needed. Use to check glucose QD DX-E11.22  . cetirizine (ZYRTEC) 10 MG tablet Take 10 mg by mouth at bedtime.  . cholecalciferol (VITAMIN D) 1000 units tablet Take 1,000 Units by mouth daily.  . diphenhydrAMINE (BENADRYL) 25 MG tablet Take by mouth as needed.   . DULoxetine (CYMBALTA) 60 MG capsule Take 1 capsule (60 mg total) by mouth daily.  Marland Kitchen glucose blood test strip CHECK BLOOD SUGAR 1 TIME DAILY-DX e11.22  . HYDROcodone-acetaminophen (NORCO/VICODIN) 5-325 MG tablet Take 1-2 tablets by mouth every 6 (six) hours as needed for moderate pain or severe pain.  . memantine (NAMENDA XR) 28 MG CP24 24 hr capsule Take 28 mg by mouth.  . Multiple Vitamin (MULTIVITAMIN) tablet Take 1 tablet by mouth daily.  . ondansetron (ZOFRAN) 8 MG tablet Take 1/2 to 1 tablet 2 to 3 x / day if needed for nausea / vomiting  . ONE TOUCH LANCETS MISC Use as directed to check glucose daily. DX-E11.22  . rosuvastatin (CRESTOR) 20 MG tablet Take 1 tablet (20 mg total) by mouth at bedtime.  Marland Kitchen  traZODone (DESYREL) 50 MG tablet as needed.    No current facility-administered medications on file prior to visit.    Allergies  Allergen Reactions  . Penicillins Rash    Swelling in face - last 10 years   Past Medical History:  Diagnosis Date  . Dementia ([place])   . Depression   . Diabetes mellitus   . Hypertension   . Thyroid disease   . Type II or unspecified type diabetes mellitus without mention of complication, not stated as uncontrolled    stopped metformin due to diarrhea, no meds now  . Vitamin D deficiency     Health Maintenance  Topic Date Due  . OPHTHALMOLOGY EXAM  10/05/2018  . HEMOGLOBIN A1C  01/23/2019  . COLONOSCOPY  04/26/2019  . FOOT EXAM  07/25/2019  . URINE MICROALBUMIN  07/26/2019  . MAMMOGRAM  02/02/2020  . TETANUS/TDAP  08/10/2025  . INFLUENZA VACCINE  Completed  . DEXA SCAN  Completed  . Hepatitis C Screening  Completed  . PNA vac Low Risk Adult  Completed   Immunization History  Administered Date(s) Administered  . DT 11/25/2005, 08/11/2015  . Influenza, High Dose Seasonal PF 03/28/2017, 07/25/2018  . Pneumococcal Conjugate-13 08/04/2014  . Pneumococcal Polysaccharide-23 03/28/1999, 03/28/2017  . Zoster 04/28/2014   Last Colon - 04/25/2012 - Dr Deatra Ina - recc 7 yr f/u due Nov 2020  Last [facility]  & dexa[facility] - 02/01/2018   Past Surgical History:  Procedure Laterality Date  . BREAST LUMPECTOMY WITH RADIOACTIVE SEED AND SENTINEL LYMPH NODE BIOPSY Right 03/08/2018   Procedure: BREAST LUMPECTOMY WITH RADIOACTIVE SEED AND SENTINEL LYMPH NODE BIOPSY;  Surgeon: [name], MD;  Location: [facility];  Service: General;  Laterality: Right;  . FRACTURE SURGERY  2009   right arm; rod  . TOTAL KNEE ARTHROPLASTY  june 2013   left   Family History  Problem Relation Age of Onset  . Colon cancer Neg Hx   . Stomach cancer Neg Hx    Social History   Tobacco Use  . Smoking status: Never Smoker  . Smokeless tobacco: Never Used  Substance Use Topics  . Alcohol use: No  . Drug use: No    ROS Constitutional: Denies fever, chills, weight loss/gain, headaches, insomnia,  night sweats, and change in appetite. Does c/o fatigue. Eyes: Denies redness, blurred vision, diplopia, discharge, itchy, watery eyes.  ENT: Denies discharge, congestion, post nasal drip, epistaxis, sore throat, earache, hearing loss, dental pain, Tinnitus, Vertigo, Sinus pain, snoring.  Cardio: Denies chest pain, palpitations, irregular heartbeat, syncope, dyspnea, diaphoresis, orthopnea, PND,  claudication, edema Respiratory: denies cough, dyspnea, DOE, pleurisy, hoarseness, laryngitis, wheezing.  Gastrointestinal: Denies dysphagia, heartburn, reflux, water brash, pain, cramps, nausea, vomiting, bloating, diarrhea, constipation, hematemesis, melena, hematochezia, jaundice, hemorrhoids Genitourinary: Denies dysuria, frequency, urgency, nocturia, hesitancy, discharge, hematuria, flank pain Breast: Breast lumps, nipple discharge, bleeding.  Musculoskeletal: Denies arthralgia, myalgia, stiffness, Jt. Swelling, pain, limp, and strain/sprain. Denies falls. Skin: Denies puritis, rash, hives, warts, acne, eczema, changing in skin lesion Neuro: No weakness, tremor, incoordination, spasms, paresthesia, pain Psychiatric: Denies confusion, memory loss, sensory loss. Denies Depression. Endocrine: Denies change in weight, skin, hair change, nocturia, and paresthesia, diabetic polys, visual blurring, hyper / hypo glycemic episodes.  Heme/Lymph: No excessive bleeding, bruising, enlarged lymph nodes.  Physical Exam  BP 130/76   Pulse 72   Temp 97.7 F (36.5 C)   Ht 5' 4.5" (1.638 m)   Wt 144 lb (65.3 kg)   SpO2 98%  BMI 24.34 kg/m   General Appearance: Well nourished, well groomed and in no apparent distress.  Eyes: PERRLA, EOMs, conjunctiva no swelling or erythema, normal fundi and vessels. Sinuses: No frontal/maxillary tenderness ENT/Mouth: EACs patent / TMs  nl. Nares clear without erythema, swelling, mucoid exudates. Oral hygiene is good. No erythema, swelling, or exudate. Tongue normal, non-obstructing. Tonsils not swollen or erythematous. Hearing normal.  Neck: Supple, thyroid not palpable. No bruits, nodes or JVD. Respiratory: Respiratory effort normal.  BS equal and clear bilateral without rales, rhonci, wheezing or stridor. Cardio: Heart sounds are normal with regular rate and rhythm and no murmurs, rubs or gallops. Peripheral pulses are normal and equal bilaterally without edema.  No aortic or femoral bruits. Chest: symmetric with normal excursions and percussion. Breasts: Symmetric, without lumps, nipple discharge, retractions, or fibrocystic changes.  Abdomen: Flat, soft with bowel sounds active. Nontender, no guarding, rebound, hernias, masses, or organomegaly.  Lymphatics: Non tender without lymphadenopathy.  Genitourinary:  Musculoskeletal: Full ROM all peripheral extremities, joint stability, 5/5 strength, and normal gait. Skin: Warm and dry without rashes, lesions, cyanosis, clubbing or  ecchymosis.  Neuro: Cranial nerves intact, reflexes equal bilaterally. Normal muscle tone, no cerebellar symptoms. Sensation intact to touch, vibratory and Monofilament to the toes bilaterally. Pysch: Alert and oriented X 3, normal affect, Insight and Judgment appropriate.   Assessment and Plan  1. Annual Preventative Screening Examination  2. Essential hypertension  - EKG 12-Lead - Urinalysis, Routine w reflex microscopic - Microalbumin / creatinine urine ratio - CBC with Differential/Platelet - COMPLETE METABOLIC PANEL WITH GFR - Magnesium - TSH  3. Hyperlipidemia, mixed  - EKG 12-Lead - Lipid panel - TSH  4. Type 2 diabetes mellitus with stage 3 chronic kidney disease, without long-term current use of insulin (HCC)  - EKG 12-Lead - Urinalysis, Routine w reflex microscopic - Microalbumin / creatinine urine ratio - HM DIABETES FOOT EXAM - LOW EXTREMITY NEUR EXAM DOCUM - Hemoglobin A1c - Insulin, random  5. Vitamin D deficiency  - VITAMIN D 25 Hydroxyl  6. Hypothyroidism  - TSH  7. SDAT    8. Depression, major, recurrent, in partial remission ([place])  - TSH  9. Screening for colorectal cancer  - POC Hemoccult Bld/Stl  10. Screening for ischemic heart disease  - EKG 12-Lead  11. Medication management  - EKG 12-Lead - Urinalysis, Routine w reflex microscopic - Microalbumin / creatinine urine ratio - CBC with Differential/Platelet -  COMPLETE METABOLIC PANEL WITH GFR - Magnesium - Lipid panel - TSH - Hemoglobin A1c - Insulin, random - VITAMIN D 25 Hydroxyl  12. Need for immunization against influenza  - Flu vaccine HIGH DOSE PF - MICROSCOPIC MESSAGE        Patient was counseled in prudent diet to achieve/maintain BMI less than 25 for weight control, BP monitoring, regular exercise and medications. Discussed med's effects and SE's. Screening labs and tests as requested with regular follow-up as recommended. Over 40 minutes of exam, counseling, chart review and high complex critical decision making was performed.

## 2018-07-24 NOTE — Patient Instructions (Signed)

## 2018-07-25 ENCOUNTER — Ambulatory Visit: Payer: Medicare Other | Admitting: Internal Medicine

## 2018-07-25 ENCOUNTER — Encounter: Payer: Self-pay | Admitting: Internal Medicine

## 2018-07-25 VITALS — BP 130/76 | HR 72 | Temp 97.7°F | Ht 64.5 in | Wt 144.0 lb

## 2018-07-25 DIAGNOSIS — Z1212 Encounter for screening for malignant neoplasm of rectum: Secondary | ICD-10-CM

## 2018-07-25 DIAGNOSIS — E039 Hypothyroidism, unspecified: Secondary | ICD-10-CM

## 2018-07-25 DIAGNOSIS — E559 Vitamin D deficiency, unspecified: Secondary | ICD-10-CM

## 2018-07-25 DIAGNOSIS — Z136 Encounter for screening for cardiovascular disorders: Secondary | ICD-10-CM

## 2018-07-25 DIAGNOSIS — E1122 Type 2 diabetes mellitus with diabetic chronic kidney disease: Secondary | ICD-10-CM

## 2018-07-25 DIAGNOSIS — G301 Alzheimer's disease with late onset: Secondary | ICD-10-CM

## 2018-07-25 DIAGNOSIS — Z0000 Encounter for general adult medical examination without abnormal findings: Secondary | ICD-10-CM

## 2018-07-25 DIAGNOSIS — N183 Chronic kidney disease, stage 3 unspecified: Secondary | ICD-10-CM

## 2018-07-25 DIAGNOSIS — I10 Essential (primary) hypertension: Secondary | ICD-10-CM | POA: Diagnosis not present

## 2018-07-25 DIAGNOSIS — Z79899 Other long term (current) drug therapy: Secondary | ICD-10-CM

## 2018-07-25 DIAGNOSIS — Z0001 Encounter for general adult medical examination with abnormal findings: Secondary | ICD-10-CM

## 2018-07-25 DIAGNOSIS — F0280 Dementia in other diseases classified elsewhere without behavioral disturbance: Secondary | ICD-10-CM

## 2018-07-25 DIAGNOSIS — Z23 Encounter for immunization: Secondary | ICD-10-CM | POA: Diagnosis not present

## 2018-07-25 DIAGNOSIS — F3341 Major depressive disorder, recurrent, in partial remission: Secondary | ICD-10-CM

## 2018-07-25 DIAGNOSIS — Z1211 Encounter for screening for malignant neoplasm of colon: Secondary | ICD-10-CM

## 2018-07-25 DIAGNOSIS — E782 Mixed hyperlipidemia: Secondary | ICD-10-CM

## 2018-07-26 LAB — URINALYSIS, ROUTINE W REFLEX MICROSCOPIC
BILIRUBIN URINE: NEGATIVE
Bacteria, UA: NONE SEEN /HPF
GLUCOSE, UA: NEGATIVE
Hgb urine dipstick: NEGATIVE
Hyaline Cast: NONE SEEN /LPF
NITRITE: NEGATIVE
PROTEIN: NEGATIVE
Specific Gravity, Urine: 1.016 (ref 1.001–1.03)
pH: <=5 (ref 5.0–8.0)

## 2018-07-26 LAB — COMPLETE METABOLIC PANEL WITH GFR
AG Ratio: 1.8 (calc) (ref 1.0–2.5)
ALBUMIN MSPROF: 4.2 g/dL (ref 3.6–5.1)
ALT: 7 U/L (ref 6–29)
AST: 16 U/L (ref 10–35)
Alkaline phosphatase (APISO): 65 U/L (ref 33–130)
BILIRUBIN TOTAL: 0.8 mg/dL (ref 0.2–1.2)
BUN / CREAT RATIO: 12 (calc) (ref 6–22)
BUN: 16 mg/dL (ref 7–25)
CO2: 25 mmol/L (ref 20–32)
CREATININE: 1.31 mg/dL — AB (ref 0.50–0.99)
Calcium: 9.7 mg/dL (ref 8.6–10.4)
Chloride: 106 mmol/L (ref 98–110)
GFR, EST AFRICAN AMERICAN: 49 mL/min/{1.73_m2} — AB (ref >=60)
GFR, Est Non African American: 42 mL/min/{1.73_m2} — ABNORMAL LOW (ref >=60)
GLUCOSE: 121 mg/dL — AB (ref 65–99)
Globulin: 2.3 g/dL (calc) (ref 1.9–3.7)
Potassium: 3.8 mmol/L (ref 3.5–5.3)
Sodium: 143 mmol/L (ref 135–146)
TOTAL PROTEIN: 6.5 g/dL (ref 6.1–8.1)

## 2018-07-26 LAB — TSH: TSH: 3.79 mIU/L (ref 0.40–4.50)

## 2018-07-26 LAB — INSULIN, RANDOM: Insulin: 10.3 u[IU]/mL (ref 2.0–19.6)

## 2018-07-26 LAB — CBC WITH DIFFERENTIAL/PLATELET
ABSOLUTE MONOCYTES: 442 {cells}/uL (ref 200–950)
Basophils Absolute: 78 cells/uL (ref 0–200)
Basophils Relative: 1.2 %
Eosinophils Absolute: 124 cells/uL (ref 15–500)
Eosinophils Relative: 1.9 %
HEMATOCRIT: 37.1 % (ref 35.0–45.0)
Hemoglobin: 12.9 g/dL (ref 11.7–15.5)
LYMPHS ABS: 754 {cells}/uL — AB (ref 850–3900)
MCH: 31.4 pg (ref 27.0–33.0)
MCHC: 34.8 g/dL (ref 32.0–36.0)
MCV: 90.3 fL (ref 80.0–100.0)
MPV: 9.3 fL (ref 7.5–12.5)
Monocytes Relative: 6.8 %
NEUTROS PCT: 78.5 %
Neutro Abs: 5103 cells/uL (ref 1500–7800)
PLATELETS: 248 10*3/uL (ref 140–400)
RBC: 4.11 10*6/uL (ref 3.80–5.10)
RDW: 12.3 % (ref 11.0–15.0)
TOTAL LYMPHOCYTE: 11.6 %
WBC: 6.5 10*3/uL (ref 3.8–10.8)

## 2018-07-26 LAB — MICROALBUMIN / CREATININE URINE RATIO
Creatinine, Urine: 172 mg/dL (ref 20–275)
Microalb Creat Ratio: 7 mcg/mg creat (ref <30)
Microalb, Ur: 1.2 mg/dL

## 2018-07-26 LAB — LIPID PANEL
Cholesterol: 283 mg/dL — ABNORMAL HIGH (ref <200)
HDL: 53 mg/dL (ref >50)
LDL CHOLESTEROL (CALC): 194 mg/dL — AB
NON-HDL CHOLESTEROL (CALC): 230 mg/dL — AB (ref <130)
Total CHOL/HDL Ratio: 5.3 (calc) — ABNORMAL HIGH (ref <5.0)
Triglycerides: 182 mg/dL — ABNORMAL HIGH (ref <150)

## 2018-07-26 LAB — HEMOGLOBIN A1C
HEMOGLOBIN A1C: 5.9 %{Hb} — AB (ref <5.7)
Mean Plasma Glucose: 123 (calc)
eAG (mmol/L): 6.8 (calc)

## 2018-07-26 LAB — VITAMIN D 25 HYDROXY (VIT D DEFICIENCY, FRACTURES): Vit D, 25-Hydroxy: 40 ng/mL (ref 30–100)

## 2018-07-26 LAB — MAGNESIUM: Magnesium: 1.7 mg/dL (ref 1.5–2.5)

## 2018-07-29 ENCOUNTER — Encounter: Payer: Self-pay | Admitting: Internal Medicine

## 2018-07-30 ENCOUNTER — Other Ambulatory Visit: Payer: Self-pay

## 2018-07-30 MED ORDER — ROSUVASTATIN CALCIUM 20 MG PO TABS: ORAL_TABLET | ORAL | 1 refills | Status: DC

## 2018-08-26 NOTE — Progress Notes (Addendum)
No show

## 2018-08-27 ENCOUNTER — Inpatient Hospital Stay: Payer: Medicare Other | Attending: Oncology

## 2018-08-27 ENCOUNTER — Encounter: Payer: Self-pay | Admitting: Oncology

## 2018-08-27 ENCOUNTER — Inpatient Hospital Stay (HOSPITAL_BASED_OUTPATIENT_CLINIC_OR_DEPARTMENT_OTHER): Payer: Medicare Other | Admitting: Oncology

## 2018-08-27 DIAGNOSIS — I10 Essential (primary) hypertension: Secondary | ICD-10-CM | POA: Insufficient documentation

## 2018-08-27 DIAGNOSIS — Z170 Estrogen receptor positive status [ER+]: Secondary | ICD-10-CM

## 2018-08-27 DIAGNOSIS — E119 Type 2 diabetes mellitus without complications: Secondary | ICD-10-CM | POA: Insufficient documentation

## 2018-08-27 DIAGNOSIS — Z79810 Long term (current) use of selective estrogen receptor modulators (SERMs): Secondary | ICD-10-CM | POA: Insufficient documentation

## 2018-08-27 DIAGNOSIS — C50411 Malignant neoplasm of upper-outer quadrant of right female breast: Secondary | ICD-10-CM

## 2018-08-27 DIAGNOSIS — F329 Major depressive disorder, single episode, unspecified: Secondary | ICD-10-CM | POA: Insufficient documentation

## 2018-09-10 ENCOUNTER — Encounter: Payer: Self-pay | Admitting: Radiation Oncology

## 2018-09-10 ENCOUNTER — Ambulatory Visit
Admission: RE | Admit: 2018-09-10 | Discharge: 2018-09-10 | Disposition: A | Discharge status: Home / Self Care | Payer: Medicare Other | Source: Ambulatory Visit | Attending: Radiation Oncology | Admitting: Radiation Oncology

## 2018-09-10 ENCOUNTER — Other Ambulatory Visit: Payer: Self-pay

## 2018-09-10 ENCOUNTER — Telehealth: Payer: Self-pay | Admitting: *Deleted

## 2018-09-10 VITALS — BP 133/81 | HR 70 | Temp 98.8°F | Resp 20 | Ht 64.5 in | Wt 144.8 lb

## 2018-09-10 DIAGNOSIS — Z79899 Other long term (current) drug therapy: Secondary | ICD-10-CM | POA: Diagnosis not present

## 2018-09-10 DIAGNOSIS — C50411 Malignant neoplasm of upper-outer quadrant of right female breast: Secondary | ICD-10-CM | POA: Diagnosis present

## 2018-09-10 DIAGNOSIS — Z170 Estrogen receptor positive status [ER+]: Secondary | ICD-10-CM | POA: Diagnosis not present

## 2018-09-10 DIAGNOSIS — Z923 Personal history of irradiation: Secondary | ICD-10-CM | POA: Diagnosis not present

## 2018-09-10 NOTE — Progress Notes (Signed)
Radiation Oncology         [phone] ________________________________  Name: Marie [name] MRN: [id number]  Date: 09/10/2018  DOB: 1951/02/08  Follow-Up Visit Note  CC: [name], MD  [name], MD    ICD-10-CM   1. 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 Right breast - 40.05 Gy in 15 fractions of 2.67 Gy 2. Boost - 10 Gy in 5 fractions of 2 Gy  Narrative:  The patient returns today for routine follow-up.  she is doing well overall. She is accompanied by her husband. They report they have been increasing efforts to eat healthy.  On review of systems, she reports occasional right breast itching. she denies breast pain and any other symptoms. Pertinent positives are listed and detailed within the above HPI.                 ALLERGIES:  is allergic to penicillins.  Meds: Current Outpatient Medications  Medication Sig Dispense Refill  . Blood Glucose Monitoring Suppl (ONETOUCH VERIO) w/Device KIT 1 kit by Does not apply route as needed. Use to check glucose QD DX-E11.22 1 kit 0  . cetirizine (ZYRTEC) 10 MG tablet Take 10 mg by mouth at bedtime.    . cholecalciferol (VITAMIN D) 1000 units tablet Take 1,000 Units by mouth daily.    . diphenhydrAMINE (BENADRYL) 25 MG tablet Take by mouth as needed.     . DULoxetine (CYMBALTA) 60 MG capsule Take 1 capsule (60 mg total) by mouth daily. 90 capsule 3  . glucose blood test strip CHECK BLOOD SUGAR 1 TIME DAILY-DX e11.22 100 each 12  . HYDROcodone-acetaminophen (NORCO/VICODIN) 5-325 MG tablet Take 1-2 tablets by mouth every 6 (six) hours as needed for moderate pain or severe pain. 15 tablet 0  . memantine (NAMENDA XR) 28 MG CP24 24 hr capsule Take 28  mg by mouth.    . Multiple Vitamin (MULTIVITAMIN) tablet Take 1 tablet by mouth daily.    . ondansetron (ZOFRAN) 8 MG tablet Take 1/2 to 1 tablet 2 to 3 x / day if needed for nausea / vomiting 30 tablet 2  . ONE TOUCH LANCETS MISC Use as directed to check glucose daily. DX-E11.22 100 each 1  . rosuvastatin (CRESTOR) 20 MG tablet Take one tablet daily for cholesterol 90 tablet 1  . traZODone (DESYREL) 50 MG tablet as needed.      No current facility-administered medications for this encounter.     Physical Findings: The patient is in no acute distress. Patient is alert and oriented.  height is 5' 4.5" (1.638 m) and weight is 144 lb 12.8 oz (65.7 kg). Her oral temperature is 98.8 F (37.1 C). Her blood pressure is 133/81 and her pulse is 70. Her respiration is 20 and oxygen saturation is 100%. .  No significant changes. Lungs are clear to auscultation bilaterally. Heart has regular rate and rhythm. No palpable cervical, supraclavicular, or axillary adenopathy. Abdomen soft, non-tender, normal bowel sounds. Left breast with no palpable mass, nipple discharge, or bleeding. Right breast pt has some hyperpigmentation changes throughout. Skin is well-healed. Some mild to moderate edema remaining   Lab Findings: Lab Results  Component Value Date  WBC 6.5 07/25/2018   HGB 12.9 07/25/2018   HCT 37.1 07/25/2018   MCV 90.3 07/25/2018   PLT 248 07/25/2018    Radiographic Findings: No results found.  Impression: Clinically stable status post-radiation therapy. No evidence of recurrence on clinical exam.   Plan:  She will continue to follow-up with Dr. Jana Hakim and PRN follow-up in radiation oncology. she has missed 2 follow-ups with Dr. Jana Hakim to  discuss adjuvant hormonal therapy. We have requested a follow-up as soon as possible concerning this issue. ____________________________________   Blair Promise, PhD, MD This document serves as a record of services personally performed by Gery Pray, MD. It was created on his behalf by Mary-Margaret Loma Messing, a trained medical scribe. The creation of this record is based on the scribe's personal observations and the provider's statements to them. This document has been checked and approved by the attending provider.

## 2018-09-10 NOTE — Progress Notes (Signed)
Marie [name] is here is here for a follow-up appointment today. Patient denies any pain or fatigue. Patient states that she has some itching to her breast. Patient states that she has some swelling in her breast. Patient denies any other skin issues.  . Vitals:   09/10/18 0949  BP: 133/81  Pulse: 70  Resp: 20  Temp: 98.8 F (37.1 C)  TempSrc: Oral  SpO2: 100%  Weight: 144 lb 12.8 oz (65.7 kg)  Height: 5' 4.5" (1.638 m)   Wt Readings from Last 3 Encounters:  09/10/18 144 lb 12.8 oz (65.7 kg)  07/25/18 144 lb (65.3 kg)  06/11/18 143 lb 2 oz (64.9 kg)

## 2018-09-10 NOTE — Telephone Encounter (Signed)
Called patient to inform that I called Dr. Jana Hakim and spoke with nurse Val and they will get her scheduled for fu with Dr. Jana Hakim, lvm for a return call

## 2018-09-21 ENCOUNTER — Telehealth: Payer: Self-pay | Admitting: Oncology

## 2018-09-21 NOTE — Telephone Encounter (Signed)
Scheduled appt per 3/26 sch message - pt aware of appt date and time  

## 2018-09-21 NOTE — Progress Notes (Signed)
[place]  [phone] [phone]     ID: Marie [name] DOB: 28-Jan-1951  MR#: [id number]  WNI#:[id number]  Patient Care Team: [name], MD as PCP - General (Internal Medicine) Chucky May, MD as Consulting Physician (Psychiatry) , Virgie Dad, MD as Consulting Physician (Oncology) [name], MD as Consulting Physician (General Surgery) Gery Pray, MD as Consulting Physician (Radiation Oncology) OTHER MD:  CHIEF COMPLAINT: Estrogen receptor positive breast cancer  CURRENT TREATMENT: tamoxifen   HISTORY OF CURRENT ILLNESS: From the original intake note:  Marie [name] had routine screening mammography on 02/01/2018 showing a possible abnormality in the right breast. She underwent unilateral right diagnostic mammography with tomography and right breast ultrasonography at [facility] on 02/09/2018 showing: breast density category C. There was a suspicious mass at the 1 o'clock upper inner quadrant of the right breast measuring 1.0 x 0.6 x 1.0 cm. Additionally there is a suspicious nodule at the 10 o'clock upper outer quadrant measuring 0.6 cm. There is no sonographic evidence of right axillary lymphadenopathy.  Accordingly on 02/09/2018 she proceeded to biopsy of the right breast area in question. The pathology from this procedure showed ([id number]) : At the 1 o' clock position there was benign breast tissue with fibrocystic change.  This was felt to be concordant by radiology.  At the 10 o'clock: Invasive ductal carcinoma, grade 2. Prognostic indicators significant for: estrogen receptor, 100% positive and progesterone receptor, 80% positive, both with strong staining intensity. Proliferation marker Ki67 at 10%. HER2 negative by IHC (0) .  The patient's subsequent history is as detailed below.  INTERVAL HISTORY: [name] presents to the office today for follow up of her estrogen receptor positive breast cancer. She is  accompanied by her husband today.   [name] did not show for her previous appointment and has not started on tamoxifen.  Since her last visit, she has not undergone any additional studies. She has followed up with Dr. Sondra Come since completing radiation therapy on 05/11/2018.   REVIEW OF SYSTEMS: [name] reports some itching to her right breast. She states she walks her dog for exercise. The patient denies unusual headaches, visual changes, nausea, vomiting, stiff neck, dizziness, or gait imbalance. There has been no cough, phlegm production, or pleurisy, no chest pain or pressure, and no change in bowel or bladder habits. The patient denies fever, rash, bleeding, unexplained fatigue or unexplained weight loss. A detailed review of systems was otherwise entirely negative.   PAST MEDICAL HISTORY: Past Medical History:  Diagnosis Date  . Dementia ([place])   . Depression   . Diabetes mellitus   . Hypertension   . Thyroid disease   . Type II or unspecified type diabetes mellitus without mention of complication, not stated as uncontrolled    stopped metformin due to diarrhea, no meds now  . Vitamin D deficiency   Diabetes controlled with diet (previously not tolerated metformin due to diarrhea)  PAST SURGICAL HISTORY: Past Surgical History:  Procedure Laterality Date  . BREAST LUMPECTOMY WITH RADIOACTIVE SEED AND SENTINEL LYMPH NODE BIOPSY Right 03/08/2018   Procedure: BREAST LUMPECTOMY WITH RADIOACTIVE SEED AND SENTINEL LYMPH NODE BIOPSY;  Surgeon: [name], MD;  Location: [facility];  Service: General;  Laterality: Right;  . FRACTURE SURGERY  2009   right arm; rod  . TOTAL KNEE ARTHROPLASTY  june 2013   left  Tonsillectomy  FAMILY HISTORY Family History  Problem Relation Age of Onset  . Emphysema Mother   .  Breast cancer Mother   . Dementia Father   . Kidney failure Father   . Colon cancer Neg Hx   . Stomach cancer Neg Hx   . Ovarian cancer Neg Hx   . Prostate  cancer Neg Hx   The patient's father died at age 67 due to dementia and kidney failure. The patient's mother died at age 67 due to emphysema. The patient had 1 brother and no sisters. The patient's mother was diagnosed with breast cancer in the 29's. She denies any other breast cancer history or any history of ovarian or prostate cancer in the family.   GYNECOLOGIC HISTORY:  No LMP recorded. Patient is postmenopausal. Menarche: 68 years old Age at first live birth: 68 years old years old She is GXP2.  Her LMP was in 1975. She did not use contraception or HRT.    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 Her husband Marie [name], " used to be a Pension scheme manager. The patient's daughter Marie [name] lives in [place] and is disabled due to a car accident and back issues. The patient's son, Marie [name] lives in [place] as a gas truck driver. The patient has 5 grandchildren and 1 great grandchild. She is not currently a church attender.     ADVANCED DIRECTIVES: The patient's husband is her 68.    HEALTH MAINTENANCE: Social History   Tobacco Use  . Smoking status: Never Smoker  . Smokeless tobacco: Never Used  Substance Use Topics  . Alcohol use: No  . Drug use: No     Colonoscopy: October 2013, Erskine Emery  PAP:  Bone density: 02/01/2018 showed a T score of -1.5 (osteopenia)   Allergies  Allergen Reactions  . Penicillins Rash    Swelling in face - last 10 years    Current Outpatient Medications  Medication Sig Dispense Refill  . Blood Glucose Monitoring Suppl (ONETOUCH VERIO) w/Device KIT 1 kit by Does not apply route as needed. Use to check glucose QD DX-E11.22 1 kit 0  . cetirizine (ZYRTEC) 10 MG tablet Take 10 mg by mouth at bedtime.    . cholecalciferol (VITAMIN D) 1000 units tablet Take 1,000 Units by mouth daily.    . diphenhydrAMINE (BENADRYL) 25 MG tablet Take by mouth as needed.     . DULoxetine (CYMBALTA) 60 MG capsule Take 1 capsule (60 mg total) by mouth daily. 90  capsule 3  . glucose blood test strip CHECK BLOOD SUGAR 1 TIME DAILY-DX e11.22 100 each 12  . memantine (NAMENDA XR) 28 MG CP24 24 hr capsule Take 28 mg by mouth.    . Multiple Vitamin (MULTIVITAMIN) tablet Take 1 tablet by mouth daily.    . ondansetron (ZOFRAN) 8 MG tablet Take 1/2 to 1 tablet 2 to 3 x / day if needed for nausea / vomiting 30 tablet 2  . ONE TOUCH LANCETS MISC Use as directed to check glucose daily. DX-E11.22 100 each 1  . rosuvastatin (CRESTOR) 20 MG tablet Take one tablet daily for cholesterol 90 tablet 1  . tamoxifen (NOLVADEX) 20 MG tablet Take 1 tablet (20 mg total) by mouth daily for 30 days. 90 tablet 12  . traZODone (DESYREL) 50 MG tablet as needed.      No current facility-administered medications for this visit.     OBJECTIVE: Middle-aged white woman who appears stated age  68:   09/24/18 1135  BP: 121/62  Pulse: 86  Resp: 18  Temp: 97.8 F (36.6 C)  SpO2: 100%  Body mass index is 24.99 kg/m.   Wt Readings from Last 3 Encounters:  09/24/18 147 lb 14.4 oz (67.1 kg)  09/10/18 144 lb 12.8 oz (65.7 kg)  07/25/18 144 lb (65.3 kg)      ECOG FS:1 - Symptomatic but completely ambulatory  Sclerae unicteric, pupils round and equal No cervical or supraclavicular adenopathy Lungs no rales or rhonchi Heart regular rate and rhythm Abd soft, nontender, positive bowel sounds MSK no focal spinal tenderness, no upper extremity lymphedema Neuro: nonfocal, well oriented, appropriate affect Breasts: The right breast is status post lumpectomy and radiation.  It is slightly larger and fuller than the left.  There is skin coarseness and hyperpigmentation as expected.  There is no evidence of local recurrence.  The left breast is unremarkable.  Both axillae are benign.  LAB RESULTS:  CMP     Component Value Date/Time   NA 143 07/25/2018 1320   K 3.8 07/25/2018 1320   CL 106 07/25/2018 1320   CO2 25 07/25/2018 1320   GLUCOSE 121 (H) 07/25/2018 1320   BUN  16 07/25/2018 1320   CREATININE 1.31 (H) 07/25/2018 1320   CALCIUM 9.7 07/25/2018 1320   PROT 6.5 07/25/2018 1320   ALBUMIN 3.6 02/21/2018 1117   AST 16 07/25/2018 1320   AST 20 02/21/2018 1117   ALT 7 07/25/2018 1320   ALT 17 02/21/2018 1117   ALKPHOS 73 02/21/2018 1117   BILITOT 0.8 07/25/2018 1320   BILITOT 0.9 02/21/2018 1117   GFRNONAA 42 (L) 07/25/2018 1320   GFRAA 49 (L) 07/25/2018 1320    No results found for: TOTALPROTELP, ALBUMINELP, A1GS, A2GS, BETS, BETA2SER, GAMS, MSPIKE, SPEI  No results found for: KPAFRELGTCHN, LAMBDASER, KAPLAMBRATIO  Lab Results  Component Value Date   WBC 6.5 07/25/2018   NEUTROABS 5,103 07/25/2018   HGB 12.9 07/25/2018   HCT 37.1 07/25/2018   MCV 90.3 07/25/2018   PLT 248 07/25/2018    @LASTCHEMISTRY @  No results found for: LABCA2  No components found for: TFTDDU202  No results for input(s): INR in the last 168 hours.  No results found for: LABCA2  No results found for: RKY706  No results found for: CBJ628  No results found for: BTD176  No results found for: CA2729  No components found for: HGQUANT  No results found for: CEA1 / No results found for: CEA1   No results found for: AFPTUMOR  No results found for: CHROMOGRNA  No results found for: PSA1  No visits with results within 3 Day(s) from this visit.  Latest known visit with results is:  Office Visit on 07/25/2018  Component Date Value Ref Range Status  . Color, Urine 07/25/2018 YELLOW  YELLOW Final  . APPearance 07/25/2018 CLOUDY* CLEAR Final  . Specific Gravity, Urine 07/25/2018 1.016  1.001 - 1.03 Final  . pH 07/25/2018 < OR = 5.0  5.0 - 8.0 Final  . Glucose, UA 07/25/2018 NEGATIVE  NEGATIVE Final  . Bilirubin Urine 07/25/2018 NEGATIVE  NEGATIVE Final  . Ketones, ur 07/25/2018 TRACE* NEGATIVE Final  . Hgb urine dipstick 07/25/2018 NEGATIVE  NEGATIVE Final  . Protein, ur 07/25/2018 NEGATIVE  NEGATIVE Final  . Nitrite 07/25/2018 NEGATIVE  NEGATIVE Final  .  Leukocytes, UA 07/25/2018 2+* NEGATIVE Final  . WBC, UA 07/25/2018 6-10* 0 - 5 /HPF Final  . RBC / HPF 07/25/2018 0-2  0 - 2 /HPF Final  . Squamous Epithelial / LPF 07/25/2018 0-5  < OR = 5 /HPF Final  . Bacteria,  UA 07/25/2018 NONE SEEN  NONE SEEN /HPF Final  . Calcium Oxalate Crystal 07/25/2018 MANY* NONE OR FE /HPF Final  . Hyaline Cast 07/25/2018 NONE SEEN  NONE SEEN /LPF Final  . Creatinine, Urine 07/25/2018 172  20 - 275 mg/dL Final  . Microalb, Ur 07/25/2018 1.2  mg/dL Final   Comment: Reference Range Not established   . Microalb Creat Ratio 07/25/2018 7  <30 mcg/mg creat Final   Comment: . The ADA defines abnormalities in albumin excretion as follows: Marland Kitchen Category         Result (mcg/mg creatinine) . Normal                    <30 Microalbuminuria         30-299  Clinical albuminuria   > OR = 300 . The ADA recommends that at least two of three specimens collected within a 3-6 month period be abnormal before considering a patient to be within a diagnostic category.   . WBC 07/25/2018 6.5  3.8 - 10.8 Thousand/uL Final  . RBC 07/25/2018 4.11  3.80 - 5.10 Million/uL Final  . Hemoglobin 07/25/2018 12.9  11.7 - 15.5 g/dL Final  . HCT 07/25/2018 37.1  35.0 - 45.0 % Final  . MCV 07/25/2018 90.3  80.0 - 100.0 fL Final  . MCH 07/25/2018 31.4  27.0 - 33.0 pg Final  . MCHC 07/25/2018 34.8  32.0 - 36.0 g/dL Final  . RDW 07/25/2018 12.3  11.0 - 15.0 % Final  . Platelets 07/25/2018 248  140 - 400 Thousand/uL Final  . MPV 07/25/2018 9.3  7.5 - 12.5 fL Final  . Neutro Abs 07/25/2018 5,103  1,500 - 7,800 cells/uL Final  . Lymphs Abs 07/25/2018 754* 850 - 3,900 cells/uL Final  . Absolute Monocytes 07/25/2018 442  200 - 950 cells/uL Final  . Eosinophils Absolute 07/25/2018 124  15 - 500 cells/uL Final  . Basophils Absolute 07/25/2018 78  0 - 200 cells/uL Final  . Neutrophils Relative % 07/25/2018 78.5  % Final  . Total Lymphocyte 07/25/2018 11.6  % Final  . Monocytes Relative 07/25/2018  6.8  % Final  . Eosinophils Relative 07/25/2018 1.9  % Final  . Basophils Relative 07/25/2018 1.2  % Final  . Glucose, Bld 07/25/2018 121* 65 - 99 mg/dL Final   Comment: .            Fasting reference interval . For someone without known diabetes, a glucose value between 100 and 125 mg/dL is consistent with prediabetes and should be confirmed with a follow-up test. .   . BUN 07/25/2018 16  7 - 25 mg/dL Final  . Creat 07/25/2018 1.31* 0.50 - 0.99 mg/dL Final   Comment: For patients >13 years of age, the reference limit for Creatinine is approximately 13% higher for people identified as African-American. .   . GFR, Est Non African American 07/25/2018 42* > OR = 60 mL/min/1.31m Final  . GFR, Est African American 07/25/2018 49* > OR = 60 mL/min/1.715mFinal  . BUN/Creatinine Ratio 07/25/2018 12  6 - 22 (calc) Final  . Sodium 07/25/2018 143  135 - 146 mmol/L Final  . Potassium 07/25/2018 3.8  3.5 - 5.3 mmol/L Final  . Chloride 07/25/2018 106  98 - 110 mmol/L Final  . CO2 07/25/2018 25  20 - 32 mmol/L Final  . Calcium 07/25/2018 9.7  8.6 - 10.4 mg/dL Final  . Total Protein 07/25/2018 6.5  6.1 - 8.1 g/dL Final  .  Albumin 07/25/2018 4.2  3.6 - 5.1 g/dL Final  . Globulin 07/25/2018 2.3  1.9 - 3.7 g/dL (calc) Final  . AG Ratio 07/25/2018 1.8  1.0 - 2.5 (calc) Final  . Total Bilirubin 07/25/2018 0.8  0.2 - 1.2 mg/dL Final  . Alkaline phosphatase (APISO) 07/25/2018 65  33 - 130 U/L Final  . AST 07/25/2018 16  10 - 35 U/L Final  . ALT 07/25/2018 7  6 - 29 U/L Final  . Magnesium 07/25/2018 1.7  1.5 - 2.5 mg/dL Final  . Cholesterol 07/25/2018 283* <200 mg/dL Final  . HDL 07/25/2018 53  >50 mg/dL Final  . Triglycerides 07/25/2018 182* <150 mg/dL Final  . LDL Cholesterol (Calc) 07/25/2018 194* mg/dL (calc) Final   Comment: LDL-C levels > or = 190 mg/dL may indicate familial  hypercholesterolemia (FH). Clinical assessment and  measurement of blood lipid levels should be  considered for all  first degree relatives of  patients with an FH diagnosis.  For questions about testing for familial hypercholesterolemia, please call Insurance risk surveyor at [email or web address]. Duncan Dull, et al. J National Lipid Association  Recommendations for Patient-Centered Management of  Dyslipidemia: Part 1 Journal of Clinical Lipidology  2015;9(2), 129-169. Reference range: <100 . Desirable range <100 mg/dL for primary prevention;   <70 mg/dL for patients with CHD or diabetic patients  with > or = 2 CHD risk factors. Marland Kitchen LDL-C is now calculated using the Martin-Hopkins  calculation, which is a validated novel method providing  better accuracy than the Friedewald equation in the  estimation of LDL-C.  Cresenciano Genre et al. Annamaria Helling. 7829;562(13): 2061-2068  (http://education.[facility].com/faq/FAQ164)   . Total CHOL/HDL Ratio 07/25/2018 5.3* <5.0 (calc) Final  . Non-HDL Cholesterol (Calc) 07/25/2018 230* <130 mg/dL (calc) Final   Comment: Non-HDL level > or = 220 is very high and may indicate  genetic familial hypercholesterolemia (FH). Clinical  assessment and measurement of blood lipid levels  should be considered for all first-degree relatives  of patients with an FH diagnosis. . For patients with diabetes plus 1 major ASCVD risk  factor, treating to a non-HDL-C goal of <100 mg/dL  (LDL-C of <70 mg/dL) is considered a therapeutic  option.   . TSH 07/25/2018 3.79  0.40 - 4.50 mIU/L Final  . Hgb A1c MFr Bld 07/25/2018 5.9* <5.7 % of total Hgb Final   Comment: For someone without known diabetes, a hemoglobin  A1c value between 5.7% and 6.4% is consistent with prediabetes and should be confirmed with a  follow-up test. . For someone with known diabetes, a value <7% indicates that their diabetes is well controlled. A1c targets should be individualized based on duration of diabetes, age, comorbid conditions, and other considerations. . This assay result is consistent with an  increased risk of diabetes. . Currently, no consensus exists regarding use of hemoglobin A1c for diagnosis of diabetes for children. .   . Mean Plasma Glucose 07/25/2018 123  (calc) Final  . eAG (mmol/L) 07/25/2018 6.8  (calc) Final  . Insulin 07/25/2018 10.3  2.0 - 19.6 uIU/mL Final   Comment: This insulin assay shows strong cross-reactivity for some insulin analogs (lispro, aspart, and glargine) and much lower cross-reactivity with others (detemir, glulisine).   . Vit D, 25-Hydroxy 07/25/2018 40  30 - 100 ng/mL Final   Comment: Vitamin D Status         25-OH Vitamin D: . Deficiency:                    <  20 ng/mL Insufficiency:             20 - 29 ng/mL Optimal:                 > or = 30 ng/mL . For 25-OH Vitamin D testing on patients on  D2-supplementation and patients for whom quantitation  of D2 and D3 fractions is required, the QuestAssureD(TM) 25-OH VIT D, (D2,D3), LC/MS/MS is recommended: order  code [phone] (patients >49yr). . For more information on this test, go to: http://education.quest[facility].com/faq/FAQ163 (This link is being provided for  informational/educational purposes only.)     (this displays the last labs from the last 3 days)  No results found for: TOTALPROTELP, ALBUMINELP, A1GS, A2GS, BETS, BETA2SER, GAMS, MSPIKE, SPEI (this displays SPEP labs)  No results found for: KPAFRELGTCHN, LAMBDASER, KAPLAMBRATIO (kappa/lambda light chains)  No results found for: HGBA, HGBA2QUANT, HGBFQUANT, HGBSQUAN (Hemoglobinopathy evaluation)   No results found for: LDH  No results found for: IRON, TIBC, IRONPCTSAT (Iron and TIBC)  No results found for: FERRITIN  Urinalysis    Component Value Date/Time   COLORURINE YELLOW 07/25/2018 1625   APPEARANCEUR CLOUDY (A) 07/25/2018 1625   LABSPEC 1.016 07/25/2018 1625   PHURINE < OR = 5.0 07/25/2018 1625   GLUCOSEU NEGATIVE 07/25/2018 1625   HGBUR NEGATIVE 07/25/2018 1625   BILIRUBINUR NEGATIVE 06/08/2016 1459    KETONESUR TRACE (A) 07/25/2018 1625   PROTEINUR NEGATIVE 07/25/2018 1625   UROBILINOGEN 0.2 07/16/2014 1212   NITRITE NEGATIVE 07/25/2018 1625   LEUKOCYTESUR 2+ (A) 07/25/2018 1625     STUDIES: No results found.  ELIGIBLE FOR AVAILABLE RESEARCH PROTOCOL:  no ASSESSMENT: 68y.o. [place] status post right breast upper outer quadrant biopsy 02/09/2018 for a clinical T1b N0, stage IA invasive ductal carcinoma, grade 2, estrogen and progesterone receptor positive, HER-2 not amplified, with an MIB-1 of 10%  (1) status post right lumpectomy and sentinel lymph node sampling 03/08/2018 for a pT1b pN0, stage IA invasive ductal carcinoma, grade 2, with negative margins  (a) a total of 4 right axillary sentinel lymph nodes were removed  (b) given overall prognosis, no Oncotype obtained  (2) adjuvant radiation 04/16/18-05/11/18 Site/dose:    1. Right breast - 40.05 Gy in 15 fractions of 2.67 Gy   2. Boost - 10 Gy in 5 fractions of 2 Gy  (3) tamoxifen to start 06/27/2018  (a) Bone density: 02/01/2018 showed a T score of -1.5 (osteopenia)  PLAN: [place] have [place] tamoxifen early in January but for reasons that are not clear to me that did not happen.  She is here today and we again discussed that medication in detail.  I am hoping she will be able to tolerated well but did caution her that hot flashes and vaginal wetness are common symptoms particularly in the early stages.  They are taking appropriate precautions regarding the current pandemic.  I have scheduled her to see me again in 3 months or so.  If we are still in the middle of the viral situation we might change that to a WebEx appointment.  Otherwise I will see her at that time and assuming she tolerates the tamoxifen well we will start broadening the follow-up interval  I encouraged them to call [place] any other issue that may develop before the next visit.    , GVirgie Dad MD  09/24/18 12:05 PM  Medical Oncology and Hematology [facility] StreetAHolladay Westgate [address]Tel. [phone]  Fax. [phone]    I, Wilburn Mylar, am acting as scribe for Dr. Virgie Dad. .  I, Lurline Del MD, have reviewed the above documentation for accuracy and completeness, and I agree with the above.

## 2018-09-24 ENCOUNTER — Other Ambulatory Visit: Payer: Self-pay

## 2018-09-24 ENCOUNTER — Encounter: Payer: Self-pay | Admitting: Oncology

## 2018-09-24 ENCOUNTER — Inpatient Hospital Stay: Payer: Medicare Other | Admitting: Oncology

## 2018-09-24 VITALS — BP 121/62 | HR 86 | Temp 97.8°F | Resp 18 | Ht 64.5 in | Wt 147.9 lb

## 2018-09-24 DIAGNOSIS — I10 Essential (primary) hypertension: Secondary | ICD-10-CM

## 2018-09-24 DIAGNOSIS — Z170 Estrogen receptor positive status [ER+]: Secondary | ICD-10-CM

## 2018-09-24 DIAGNOSIS — F329 Major depressive disorder, single episode, unspecified: Secondary | ICD-10-CM | POA: Diagnosis not present

## 2018-09-24 DIAGNOSIS — Z79810 Long term (current) use of selective estrogen receptor modulators (SERMs): Secondary | ICD-10-CM

## 2018-09-24 DIAGNOSIS — E119 Type 2 diabetes mellitus without complications: Secondary | ICD-10-CM

## 2018-09-24 DIAGNOSIS — C50411 Malignant neoplasm of upper-outer quadrant of right female breast: Secondary | ICD-10-CM | POA: Diagnosis not present

## 2018-09-24 DIAGNOSIS — E079 Disorder of thyroid, unspecified: Secondary | ICD-10-CM

## 2018-09-24 MED ORDER — TAMOXIFEN CITRATE 20 MG PO TABS: 20.0000 mg | ORAL_TABLET | Freq: Every day | ORAL | 12 refills | Status: AC

## 2018-10-15 ENCOUNTER — Ambulatory Visit: Payer: Self-pay | Admitting: Adult Health

## 2018-10-25 NOTE — Progress Notes (Signed)
MEDICARE ANNUAL WELLNESS VISIT AND FOLLOW UP  Assessment:   Diagnoses and all orders for this visit:  Encounter for Medicare annual wellness exam  Essential hypertension  Currently at goal off of medications Monitor blood pressure at home; call if consistently over 130/80 Continue DASH diet.   Reminder to go to the ER if any CP, SOB, nausea, dizziness, severe HA, changes vision/speech, left arm numbness and tingling and jaw pain.  Non-seasonal allergic rhinitis, unspecified trigger Continue OTC allergy pills  Type 2 diabetes mellitus with stage 3 chronic kidney disease, without long-term current use of insulin (HCC) Currently off of metformin secondary to acute kidney injury; recent A1Cs well controlled by diet in prediabetic range Education: Reviewed 'ABCs' of diabetes management (respective goals in parentheses):  A1C (<7), blood pressure (<130/80), and cholesterol (LDL <70) Eye Exam yearly and Dental Exam every 6 months - reminded to schedule diabetic eye exam with Dr.  Annette Stable recommendations Physical Activity recommendations Reminded to check feet daily  CKD stage 3 due to type 2 diabetes mellitus (HCC) Increase fluids, avoid NSAIDS, monitor sugars, will monitor Check CMP/GFR Refer to nephrology if insufficient recovery from recent acute decline/injury -  Poorly adherent to recommendations for fluid intake  SDAT  Continue namenda Managed at this office Doing well with ADLs; family members are around to help monitor at home Doesn't drive by herself Needs to be reminded to drink sufficient fluids; strategies discussed  Vitamin D deficiency She is newly on vitamin D 5000 IU daily; titrate for goal of 60-100 Check vitamin D  Medication management CBC, CMP/GFR  Hyperlipidemia Poorly controlled; ?adherence, emphasized risks and need for compliance Titrate statin as tolerated Continue medications: atorvastatin Continue low cholesterol diet and exercise.  Check lipid  panel.   Generalized anxiety disorder Managed by Dr. Toy Care  Depression, controlled Managed by Dr. Toy Care  Body mass index (BMI) of 24.0-24.9 in adult Continue to recommend diet heavy in fruits and veggies and low in animal meats, cheeses, and dairy products, appropriate calorie intake Discuss exercise recommendations routinely Continue to monitor weight at each visit  Estrogen sensitive R breast cancer ([place]) On tamoxifen, followed by Dr. Jana Hakim   Osteopenia - get dexa 2021, continue Vit D and Ca, weight bearing exercises   Over 40 minutes of exam, counseling, chart review and critical decision making was performed Future Appointments  Date Time Provider [place]  12/10/2018  2:00 PM Gardenia Phlegm, NP [place] None  12/25/2018 11:30 AM [facility] None  12/25/2018 12:00 PM Magrinat, Virgie Dad, MD [facility] None  01/31/2019 10:30 AM [name], MD [facility] None  08/19/2019  2:00 PM [name], MD [facility] None     Plan:   During the course of the visit the patient was educated and counseled about appropriate screening and preventive services including:    Pneumococcal vaccine   Prevnar 13  Influenza vaccine  Td vaccine  Screening electrocardiogram  Bone densitometry screening  Colorectal cancer screening  Diabetes screening  Glaucoma screening  Nutrition counseling   Advanced directives: requested   Subjective:  Marie [name] is a 68 y.o. female who presents for Medicare Annual Wellness Visit and 3 month follow up.   Patient has been on SS Disability since 2015 for Depression & Dementia (on namenda) and is followed by Dr Toy Care.  She was evaluated by Dr Leta Baptist in 2012 for her Dementia & she requires moderate supervision by her caretaker husband; he drives, manages meds and finances but she is  able to complete all other ADLs independently.   She has newly diagnosed R breast cancer in 2019, clinical  T1b N0, stage IA, estrogen sensitive, s/p R lumpectomy and radiation followed by Dr. Jana Hakim and recently initiated on tamoxifen and doing well with good prognosis.   BMI is Body mass index is 24.84 kg/m., she has been working on diet and exercise, avoiding white foods and walking daily with dog near home.  Wt Readings from Last 3 Encounters:  10/29/18 147 lb (66.7 kg)  09/24/18 147 lb 14.4 oz (67.1 kg)  09/10/18 144 lb 12.8 oz (65.7 kg)    Her blood pressure has been controlled at home, today their BP is BP: 110/66 She does workout. She denies chest pain, shortness of breath, dizziness.   She is on cholesterol medication (rosuvastatin 20 mg daily - reports was off of medication at last visit but has restarted) and denies myalgias. Her cholesterol is not at goal. The cholesterol last visit was:   Lab Results  Component Value Date   CHOL 283 (H) 07/25/2018   HDL 53 07/25/2018   LDLCALC 194 (H) 07/25/2018   TRIG 182 (H) 07/25/2018   CHOLHDL 5.3 (H) 07/25/2018   . She has been working on diet and exercise for T2DM controlled by diet/lifestyle (formerly on metformin, was d/c'd after renal function decline with some improvement since then), and denies foot ulcerations, increased appetite, nausea, paresthesia of the feet, polydipsia, polyuria, visual disturbances, vomiting and weight loss. Reports fasting sugars running 130s. Last A1C in the office was:  Lab Results  Component Value Date   HGBA1C 5.9 (H) 07/25/2018   Last GFR: Lab Results  Component Value Date   GFRNONAA 42 (L) 07/25/2018  She reports currently drinking around 16 fluid ounces x 2  Patient is reportedly on vitamin D supplement but remains well below goal at recent check:  Lab Results  Component Value Date   VD25OH 40 07/25/2018      Medication Review: Current Outpatient Medications on File Prior to Visit  Medication Sig Dispense Refill  . Blood Glucose Monitoring Suppl (ONETOUCH VERIO) w/Device KIT 1 kit by Does  not apply route as needed. Use to check glucose QD DX-E11.22 1 kit 0  . cholecalciferol (VITAMIN D) 1000 units tablet Take 5,000 Units by mouth daily.     . diphenhydrAMINE (BENADRYL) 25 MG tablet Take by mouth as needed.     . DULoxetine (CYMBALTA) 60 MG capsule Take 1 capsule (60 mg total) by mouth daily. 90 capsule 3  . glucose blood test strip CHECK BLOOD SUGAR 1 TIME DAILY-DX e11.22 100 each 12  . memantine (NAMENDA XR) 28 MG CP24 24 hr capsule Take 28 mg by mouth.    . Multiple Vitamin (MULTIVITAMIN) tablet Take 1 tablet by mouth daily.    . ondansetron (ZOFRAN) 8 MG tablet Take 1/2 to 1 tablet 2 to 3 x / day if needed for nausea / vomiting 30 tablet 2  . ONE TOUCH LANCETS MISC Use as directed to check glucose daily. DX-E11.22 100 each 1  . rosuvastatin (CRESTOR) 20 MG tablet Take one tablet daily for cholesterol 90 tablet 1  . tamoxifen (NOLVADEX) 20 MG tablet Take 20 mg by mouth daily.    . traZODone (DESYREL) 50 MG tablet as needed.     . cetirizine (ZYRTEC) 10 MG tablet Take 10 mg by mouth at bedtime.     No current facility-administered medications on file prior to visit.     Allergies  Allergen Reactions  . Penicillins Rash    Swelling in face - last 10 years    Current Problems (verified) Patient Active Problem List   Diagnosis Date Noted  . Malignant neoplasm of upper-outer quadrant of right breast in female, estrogen receptor positive ([place]) 02/13/2018  . CKD stage 3 due to type 2 diabetes mellitus ([place]) 10/04/2017  . Body mass index (BMI) of 24.0-24.9 in adult 05/05/2015  . Generalized anxiety disorder 08/04/2014  . Essential hypertension 04/28/2014  . Medication management 04/28/2014  . Vitamin D deficiency 10/04/2013  . Type 2 diabetes mellitus treated without insulin ([place])   . Depression, major, recurrent, in partial remission ([place])   . SDAT    . Hyperlipidemia associated with type 2 diabetes mellitus ([place]) 07/28/2007  . Allergic rhinitis 07/28/2007     Screening Tests Immunization History  Administered Date(s) Administered  . DT 11/25/2005, 08/11/2015  . Influenza, High Dose Seasonal PF 03/28/2017, 07/25/2018  . Pneumococcal Conjugate-13 08/04/2014  . Pneumococcal Polysaccharide-23 03/28/1999, 03/28/2017  . Zoster 04/28/2014   Preventative care: Last colonoscopy: 2013 due 03/2019 Mammogram: 01/2018 - R breast mass  Last pap smear/pelvic exam: 2013? Declines further DEXA:  02/01/2018 - L fem T -1.5  Prior vaccinations: TD or Tdap: 2017  Influenza: 06/2018 Pneumococcal: 2000, 2018 Prevnar13: 2016 Shingles/Zostavax: 2015  Names of Other Physician/Practitioners you currently use: 1. [facility] here for primary care 2. Dr. Delman Cheadle, eye doctor, last visit 09/2017, - reminded diabetic eye, will schedule after covid  3. [place], dentist, last visit 2019, q69m Patient Care Team: MUnk Pinto MD as PCP - General (Internal Medicine) KChucky May MD as Consulting Physician (Psychiatry) Magrinat, GVirgie Dad MD as Consulting Physician (Oncology) TJovita Kussmaul MD as Consulting Physician (General Surgery) KGery Pray MD as Consulting Physician (Radiation Oncology)  SURGICAL HISTORY She  has a past surgical history that includes Total knee arthroplasty (june 2013); Fracture surgery (2009); and Breast lumpectomy with radioactive seed and sentinel lymph node biopsy (Right, 03/08/2018). FAMILY HISTORY Her family history includes Breast cancer in her mother; Dementia in her father; Emphysema in her mother; Kidney failure in her father. SOCIAL HISTORY She  reports that she has never smoked. She has never used smokeless tobacco. She reports that she does not drink alcohol or use drugs.   MEDICARE WELLNESS OBJECTIVES: Physical activity:   Cardiac risk factors:   Depression/mood screen:   [facility]/9 10/29/2018  Decreased Interest 0  Down, Depressed, Hopeless 1  PHQ  - 2 Score 1  Altered sleeping -  Tired, decreased energy -  Change in appetite -  Feeling bad or failure about yourself  -  Trouble concentrating -  Moving slowly or fidgety/restless -  Suicidal thoughts -  PHQ-9 Score -  Difficult doing work/chores -    ADLs:  In your present state of health, do you have any difficulty performing the following activities: 07/24/2018 01/09/2018  Hearing? N N  Vision? N N  Difficulty concentrating or making decisions? N N  Walking or climbing stairs? N N  Dressing or bathing? N N  Doing errands, shopping? N N  Some recent data might be hidden     Cognitive Testing   Alert? Yes  Normal Appearance?Yes  Oriented to person? Yes  Place? Yes   Time? Yes  Recall of three objects?  2/3  Can perform simple calculations? Yes  Displays appropriate judgment?Yes  Can read the correct time from a watch face?Yes  EOL planning: Does Patient Have a Medical Advance Directive?: No Would patient like information on creating a medical advance directive?: Yes (MAU/Ambulatory/Procedural Areas - Information given)  Review of Systems  Constitutional: Negative for malaise/fatigue and weight loss.  HENT: Negative for hearing loss and tinnitus.   Eyes: Negative for blurred vision and double vision.  Respiratory: Negative for cough, sputum production, shortness of breath and wheezing.   Cardiovascular: Negative for chest pain, palpitations, orthopnea, claudication, leg swelling and PND.  Gastrointestinal: Negative for abdominal pain, blood in stool, constipation, diarrhea, heartburn, melena, nausea and vomiting.  Genitourinary: Negative.   Musculoskeletal: Negative for falls, joint pain and myalgias.  Skin: Negative for rash.  Neurological: Negative for dizziness, tingling, sensory change, weakness and headaches.  Endo/Heme/Allergies: Negative for polydipsia.  Psychiatric/Behavioral: Negative.  Negative for depression, memory loss, substance abuse and suicidal ideas.  The patient is not nervous/anxious and does not have insomnia.   All other systems reviewed and are negative.    Objective:     Today's Vitals   10/29/18 0943  BP: 110/66  Pulse: 89  Temp: 97.7 F (36.5 C)  SpO2: 98%  Weight: 147 lb (66.7 kg)   Body mass index is 24.84 kg/m.  General appearance: alert, no distress, WD/WN, female HEENT: normocephalic, sclerae anicteric, TMs pearly, nares patent, no discharge or erythema, pharynx normal Oral cavity: MMM, no lesions Neck: supple, no lymphadenopathy, no thyromegaly, no masses Heart: RRR, normal S1, S2, no murmurs Lungs: CTA bilaterally, no wheezes, rhonchi, or rales Abdomen: +bs, soft, non tender, non distended, no masses, no hepatomegaly, no splenomegaly Musculoskeletal: nontender, no swelling, no obvious deformity Extremities: no edema, no cyanosis, no clubbing Pulses: 2+ symmetric, upper and lower extremities, normal cap refill Neurological: alert, oriented x 3, CN2-12 intact, strength normal upper extremities and lower extremities, sensation normal throughout, DTRs 2+ throughout, no cerebellar signs, gait normal Psychiatric: normal affect, behavior normal, pleasant   Medicare Attestation I have personally reviewed: The patient's medical and social history Their use of alcohol, tobacco or illicit drugs Their current medications and supplements The patient's functional ability including ADLs,fall risks, home safety risks, cognitive, and hearing and visual impairment Diet and physical activities Evidence for depression or mood disorders  The patient's weight, height, BMI, and visual acuity have been recorded in the chart.  I have made referrals, counseling, and provided education to the patient based on review of the above and I have provided the patient with a written personalized care plan for preventive services.     Izora Ribas, NP   10/29/2018

## 2018-10-29 ENCOUNTER — Ambulatory Visit: Payer: Medicare Other | Admitting: Adult Health

## 2018-10-29 ENCOUNTER — Encounter: Payer: Self-pay | Admitting: Adult Health

## 2018-10-29 ENCOUNTER — Other Ambulatory Visit: Payer: Self-pay

## 2018-10-29 VITALS — BP 110/66 | HR 89 | Temp 97.7°F | Wt 147.0 lb

## 2018-10-29 DIAGNOSIS — E1122 Type 2 diabetes mellitus with diabetic chronic kidney disease: Secondary | ICD-10-CM | POA: Diagnosis not present

## 2018-10-29 DIAGNOSIS — E785 Hyperlipidemia, unspecified: Secondary | ICD-10-CM

## 2018-10-29 DIAGNOSIS — E119 Type 2 diabetes mellitus without complications: Secondary | ICD-10-CM

## 2018-10-29 DIAGNOSIS — J3089 Other allergic rhinitis: Secondary | ICD-10-CM

## 2018-10-29 DIAGNOSIS — Z6824 Body mass index (BMI) 24.0-24.9, adult: Secondary | ICD-10-CM

## 2018-10-29 DIAGNOSIS — Z0001 Encounter for general adult medical examination with abnormal findings: Secondary | ICD-10-CM

## 2018-10-29 DIAGNOSIS — Z170 Estrogen receptor positive status [ER+]: Secondary | ICD-10-CM

## 2018-10-29 DIAGNOSIS — F411 Generalized anxiety disorder: Secondary | ICD-10-CM

## 2018-10-29 DIAGNOSIS — I10 Essential (primary) hypertension: Secondary | ICD-10-CM

## 2018-10-29 DIAGNOSIS — Z0000 Encounter for general adult medical examination without abnormal findings: Secondary | ICD-10-CM

## 2018-10-29 DIAGNOSIS — G301 Alzheimer's disease with late onset: Secondary | ICD-10-CM

## 2018-10-29 DIAGNOSIS — E559 Vitamin D deficiency, unspecified: Secondary | ICD-10-CM

## 2018-10-29 DIAGNOSIS — F3341 Major depressive disorder, recurrent, in partial remission: Secondary | ICD-10-CM

## 2018-10-29 DIAGNOSIS — E1169 Type 2 diabetes mellitus with other specified complication: Secondary | ICD-10-CM

## 2018-10-29 DIAGNOSIS — Z79899 Other long term (current) drug therapy: Secondary | ICD-10-CM

## 2018-10-29 DIAGNOSIS — F0280 Dementia in other diseases classified elsewhere without behavioral disturbance: Secondary | ICD-10-CM

## 2018-10-29 DIAGNOSIS — R6889 Other general symptoms and signs: Secondary | ICD-10-CM | POA: Diagnosis not present

## 2018-10-29 DIAGNOSIS — N183 Chronic kidney disease, stage 3 (moderate): Secondary | ICD-10-CM

## 2018-10-29 DIAGNOSIS — C50411 Malignant neoplasm of upper-outer quadrant of right female breast: Secondary | ICD-10-CM

## 2018-10-29 NOTE — Patient Instructions (Addendum)
Marie [name] , Thank you for taking time to come for your Medicare Wellness Visit. I appreciate your ongoing commitment to your health goals. Please review the following plan we discussed and let me know if I can assist you in the future.   These are the goals we discussed: Goals    . DIET - INCREASE WATER INTAKE     Aim for 4-5 large glasses or bottles of water or clear fluids daily     . Exercise 150 min/wk Moderate Activity       This is a list of the screening recommended for you and due dates:  Health Maintenance  Topic Date Due  . Eye exam for diabetics  10/05/2018  . Hemoglobin A1C  01/23/2019  . Flu Shot  01/26/2019  . Colon Cancer Screening  04/26/2019  . Complete foot exam   07/25/2019  . Urine Protein Check  07/26/2019  . Mammogram  02/02/2020  . Tetanus Vaccine  08/10/2025  . DEXA scan (bone density measurement)  Completed  .  Hepatitis C: One time screening is recommended by Center for Disease Control  (CDC) for  adults born from [birth year] through 1964.   Completed  . Pneumonia vaccines  Completed    Please schedule your diabetic eye exam with Dr. Delman Cheadle later this year    Tamoxifen oral tablet What is this medicine? TAMOXIFEN (ta MOX i fen) blocks the effects of estrogen. It is commonly used to treat breast cancer. It is also used to decrease the chance of breast cancer coming back in women who have received treatment for the disease. It may also help prevent breast cancer in women who have a high risk of developing breast cancer. This medicine may be used for other purposes; ask your health care provider or pharmacist if you have questions. COMMON BRAND NAME(S): Nolvadex What should I tell my health care provider before I take this medicine? They need to know if you have any of these conditions: -blood clots -blood disease -cataracts or impaired eyesight -endometriosis -high calcium levels -high cholesterol -irregular menstrual cycles -liver  disease -stroke -uterine fibroids -an unusual reaction to tamoxifen, other medicines, foods, dyes, or preservatives -pregnant or trying to get pregnant -breast-feeding How should I use this medicine? Take this medicine by mouth with a glass of water. Follow the directions on the prescription label. You can take it with or without food. Take your medicine at regular intervals. Do not take your medicine more often than directed. Do not stop taking except on your doctor's advice. A special MedGuide will be given to you by the pharmacist with each prescription and refill. Be sure to read this information carefully each time. Talk to your pediatrician regarding the use of this medicine in children. While this drug may be prescribed for selected conditions, precautions do apply. Overdosage: If you think you have taken too much of this medicine contact a poison control center or emergency room at once. NOTE: This medicine is only for you. Do not share this medicine with others. What if I miss a dose? If you miss a dose, take it as soon as you can. If it is almost time for your next dose, take only that dose. Do not take double or extra doses. What may interact with this medicine? Do not take this medicine with any of the following medications: -cisapride -certain medicines for irregular heart beat like dofetilide, dronedarone, quinidine -certain medicines for fungal infection like fluconazole, posaconazole -pimozide -saquinavir -thioridazine This medicine  may also interact with the following medications: -aminoglutethimide -anastrozole -bromocriptine -chemotherapy drugs -female hormones, like estrogens and birth control pills -letrozole -medroxyprogesterone -phenobarbital -rifampin -warfarin This list may not describe all possible interactions. Give your health care provider a list of all the medicines, herbs, non-prescription drugs, or dietary supplements you use. Also tell them if you  smoke, drink alcohol, or use illegal drugs. Some items may interact with your medicine. What should I watch for while using this medicine? Visit your doctor or health care professional for regular checks on your progress. You will need regular pelvic exams, breast exams, and mammograms. If you are taking this medicine to reduce your risk of getting breast cancer, you should know that this medicine does not prevent all types of breast cancer. If breast cancer or other problems occur, there is no guarantee that it will be found at an early stage. Do not become pregnant while taking this medicine or for 2 months after stopping it. Women should inform their doctor if they wish to become pregnant or think they might be pregnant. There is a potential for serious side effects to an unborn child. Talk to your health care professional or pharmacist for more information. Do not breast-feed an infant while taking this medicine or for 3 months after stopping it. This medicine may interfere with the ability to have a child. Talk with your doctor or health care professional if you are concerned about your fertility. What side effects may I notice from receiving this medicine? Side effects that you should report to your doctor or health care professional as soon as possible: -allergic reactions like skin rash, itching or hives, swelling of the face, lips, or tongue -changes in vision -changes in your menstrual cycle -difficulty walking or talking -new breast lumps -numbness -pelvic pain or pressure -redness, blistering, peeling or loosening of the skin, including inside the mouth -signs and symptoms of a dangerous change in heartbeat or heart rhythm like chest pain, dizziness, fast or irregular heartbeat, palpitations, feeling faint or lightheaded, falls, breathing problems -sudden chest pain -swelling, pain or tenderness in your calf or leg -unusual bruising or bleeding -vaginal discharge that is bloody, brown,  or rust -weakness -yellowing of the whites of the eyes or skin Side effects that usually do not require medical attention (report to your doctor or health care professional if they continue or are bothersome): -fatigue -hair loss, although uncommon and is usually mild -headache -hot flashes -impotence (in men) -nausea, vomiting (mild) -vaginal discharge (white or clear) This list may not describe all possible side effects. Call your doctor for medical advice about side effects. You may report side effects to FDA at 1-800-FDA-1088. Where should I keep my medicine? Keep out of the reach of children. Store at room temperature between 20 and 25 degrees C (68 and 77 degrees F). Protect from light. Keep container tightly closed. Throw away any unused medicine after the expiration date. NOTE: This sheet is a summary. It may not cover all possible information. If you have questions about this medicine, talk to your doctor, pharmacist, or health care provider.  2019 Elsevier/Gold Standard (2017-10-04 16:49:57)

## 2018-10-30 ENCOUNTER — Other Ambulatory Visit: Payer: Self-pay | Admitting: Adult Health

## 2018-10-30 DIAGNOSIS — E039 Hypothyroidism, unspecified: Secondary | ICD-10-CM

## 2018-10-30 LAB — COMPLETE METABOLIC PANEL WITH GFR
AG Ratio: 1.6 (calc) (ref 1.0–2.5)
ALT: 7 U/L (ref 6–29)
AST: 14 U/L (ref 10–35)
Albumin: 4.1 g/dL (ref 3.6–5.1)
Alkaline phosphatase (APISO): 65 U/L (ref 37–153)
BUN/Creatinine Ratio: 11 (calc) (ref 6–22)
BUN: 14 mg/dL (ref 7–25)
CO2: 27 mmol/L (ref 20–32)
Calcium: 9.5 mg/dL (ref 8.6–10.4)
Chloride: 106 mmol/L (ref 98–110)
Creat: 1.3 mg/dL — ABNORMAL HIGH (ref 0.50–0.99)
GFR, Est African American: 50 mL/min/{1.73_m2} — ABNORMAL LOW (ref >=60)
GFR, Est Non African American: 43 mL/min/{1.73_m2} — ABNORMAL LOW (ref >=60)
Globulin: 2.6 g/dL (calc) (ref 1.9–3.7)
Glucose, Bld: 132 mg/dL — ABNORMAL HIGH (ref 65–99)
Potassium: 4.2 mmol/L (ref 3.5–5.3)
Sodium: 141 mmol/L (ref 135–146)
Total Bilirubin: 0.7 mg/dL (ref 0.2–1.2)
Total Protein: 6.7 g/dL (ref 6.1–8.1)

## 2018-10-30 LAB — CBC WITH DIFFERENTIAL/PLATELET
Absolute Monocytes: 554 cells/uL (ref 200–950)
Basophils Absolute: 59 cells/uL (ref 0–200)
Basophils Relative: 0.7 %
Eosinophils Absolute: 126 cells/uL (ref 15–500)
Eosinophils Relative: 1.5 %
HCT: 39 % (ref 35.0–45.0)
Hemoglobin: 13.2 g/dL (ref 11.7–15.5)
Lymphs Abs: 647 cells/uL — ABNORMAL LOW (ref 850–3900)
MCH: 30.3 pg (ref 27.0–33.0)
MCHC: 33.8 g/dL (ref 32.0–36.0)
MCV: 89.4 fL (ref 80.0–100.0)
MPV: 9.4 fL (ref 7.5–12.5)
Monocytes Relative: 6.6 %
Neutro Abs: 7014 cells/uL (ref 1500–7800)
Neutrophils Relative %: 83.5 %
Platelets: 218 10*3/uL (ref 140–400)
RBC: 4.36 10*6/uL (ref 3.80–5.10)
RDW: 12.1 % (ref 11.0–15.0)
Total Lymphocyte: 7.7 %
WBC: 8.4 10*3/uL (ref 3.8–10.8)

## 2018-10-30 LAB — LIPID PANEL
Cholesterol: 139 mg/dL (ref <200)
HDL: 54 mg/dL (ref >=50)
LDL Cholesterol (Calc): 71 mg/dL (calc)
Non-HDL Cholesterol (Calc): 85 mg/dL (calc) (ref <130)
Total CHOL/HDL Ratio: 2.6 (calc) (ref <5.0)
Triglycerides: 65 mg/dL (ref <150)

## 2018-10-30 LAB — HEMOGLOBIN A1C
Hgb A1c MFr Bld: 6.1 % of total Hgb — ABNORMAL HIGH (ref <5.7)
Mean Plasma Glucose: 128 (calc)
eAG (mmol/L): 7.1 (calc)

## 2018-10-30 LAB — TSH: TSH: 5.3 mIU/L — ABNORMAL HIGH (ref 0.40–4.50)

## 2018-10-30 LAB — VITAMIN D 25 HYDROXY (VIT D DEFICIENCY, FRACTURES): Vit D, 25-Hydroxy: 38 ng/mL (ref 30–100)

## 2018-12-07 ENCOUNTER — Telehealth: Payer: Self-pay | Admitting: Oncology

## 2018-12-07 ENCOUNTER — Telehealth: Payer: Self-pay | Admitting: Adult Health

## 2018-12-07 NOTE — Telephone Encounter (Signed)
Called patient regarding upcoming [facility] appointment, per patient's husband's request this will be a telephone visit.

## 2018-12-07 NOTE — Telephone Encounter (Signed)
Contacted pt to verify telephone visit for pre reg °

## 2018-12-10 ENCOUNTER — Encounter: Payer: Self-pay | Admitting: Adult Health

## 2018-12-10 ENCOUNTER — Inpatient Hospital Stay: Payer: Medicare Other | Attending: Oncology | Admitting: Adult Health

## 2018-12-10 DIAGNOSIS — Z170 Estrogen receptor positive status [ER+]: Secondary | ICD-10-CM

## 2018-12-10 DIAGNOSIS — C50411 Malignant neoplasm of upper-outer quadrant of right female breast: Secondary | ICD-10-CM

## 2018-12-10 DIAGNOSIS — I10 Essential (primary) hypertension: Secondary | ICD-10-CM

## 2018-12-10 DIAGNOSIS — Z79810 Long term (current) use of selective estrogen receptor modulators (SERMs): Secondary | ICD-10-CM

## 2018-12-10 DIAGNOSIS — Z79899 Other long term (current) drug therapy: Secondary | ICD-10-CM

## 2018-12-10 DIAGNOSIS — Z923 Personal history of irradiation: Secondary | ICD-10-CM | POA: Diagnosis not present

## 2018-12-10 DIAGNOSIS — E119 Type 2 diabetes mellitus without complications: Secondary | ICD-10-CM

## 2018-12-10 NOTE — Progress Notes (Signed)
SURVIVORSHIP VIRTUAL VISIT:  I connected with [name] via her husband, [name] on 12/10/18 at  2:00 PM EDT by telephone and verified that I am speaking with the correct person using two identifiers.   I discussed the limitations, risks, security and privacy concerns of performing an evaluation and management service by telephone and the availability of in person appointments. I also discussed with the patient that there may be a patient responsible charge related to this service. The patient expressed understanding and agreed to proceed.   BRIEF ONCOLOGIC HISTORY:  Oncology History  Malignant neoplasm of upper-outer quadrant of right breast in female, estrogen receptor positive ([place])  02/09/2018 Initial Diagnosis   Status post right breast upper outer quadrant biopsy for a clinical T1b N0, stage IA invasive ductal carcinoma, grade 2, estrogen and progesterone receptor positive, HER-2 not amplified, with an MIB-1 of 10%       03/08/2018 Surgery   (1) status post right lumpectomy and sentinel lymph node sampling for a pT1b pN0, stage IA invasive ductal carcinoma, grade 2, with negative margins             (a) a total of 4 right axillary sentinel lymph nodes were removed             (b) given overall prognosis, no Oncotype obtained   04/16/2018 - 05/11/2018 Radiation Therapy   adjuvant radiation Site/dose: 1. Right breast - 40.05 Gy in 15 fractions of 2.67 Gy 2. Boost - 10 Gy in 5 fractions of 2 Gy   06/2018 -  Anti-estrogen oral therapy   Tamoxifen daily             (a) Bone density: 02/01/2018 showed a T score of -1.5 (osteopenia)     INTERVAL HISTORY:  Marie [name] to review her survivorship care plan detailing her treatment course for breast cancer, as well as monitoring long-term side effects of that treatment, education regarding health maintenance, screening, and overall wellness and health promotion.     Overall, Marie [name] reports feeling quite well.  Her husband  [name] is speaking for her due to her h/o dementia.  He notes that her memory is worse since starting the Tamoxifen.  He wonders if that may be related.  He notes that her mood may be worse as well.    REVIEW OF SYSTEMS:  Review of Systems - Oncology Breast: Denies any new nodularity, masses, tenderness, nipple changes, or nipple discharge.      ONCOLOGY TREATMENT TEAM:  1. Surgeon:  Dr. Marlou Starks at [facility] 2. Medical Oncologist: Dr. Jana Hakim  3. Radiation Oncologist: Dr. Sondra Come    PAST MEDICAL/SURGICAL HISTORY:  Past Medical History:  Diagnosis Date  . Dementia ([place])   . Depression   . Diabetes mellitus   . Hypertension   . Thyroid disease   . Type II or unspecified type diabetes mellitus without mention of complication, not stated as uncontrolled    stopped metformin due to diarrhea, no meds now  . Vitamin D deficiency    Past Surgical History:  Procedure Laterality Date  . BREAST LUMPECTOMY WITH RADIOACTIVE SEED AND SENTINEL LYMPH NODE BIOPSY Right 03/08/2018   Procedure: BREAST LUMPECTOMY WITH RADIOACTIVE SEED AND SENTINEL LYMPH NODE BIOPSY;  Surgeon: [name], MD;  Location: [facility];  Service: General;  Laterality: Right;  . FRACTURE SURGERY  2009   right arm; rod  . TOTAL KNEE ARTHROPLASTY  june 2013   left  ALLERGIES:  Allergies  Allergen Reactions  . Penicillins Rash    Swelling in face - last 10 years     CURRENT MEDICATIONS:  Outpatient Encounter Medications as of 12/10/2018  Medication Sig  . Blood Glucose Monitoring Suppl (ONETOUCH VERIO) w/Device KIT 1 kit by Does not apply route as needed. Use to check glucose QD [id number]  . cetirizine (ZYRTEC) 10 MG tablet Take 10 mg by mouth at bedtime.  . cholecalciferol (VITAMIN D) 1000 units tablet Take 10,000 Units by mouth daily.  . diphenhydrAMINE (BENADRYL) 25 MG tablet Take by mouth as needed.   . DULoxetine (CYMBALTA) 60 MG capsule Take 1 capsule (60 mg total) by  mouth daily.  Marland Kitchen glucose blood test strip CHECK BLOOD SUGAR 1 TIME DAILY-DX e11.22  . memantine (NAMENDA XR) 28 MG CP24 24 hr capsule Take 28 mg by mouth.  . Multiple Vitamin (MULTIVITAMIN) tablet Take 1 tablet by mouth daily.  . ondansetron (ZOFRAN) 8 MG tablet Take 1/2 to 1 tablet 2 to 3 x / day if needed for nausea / vomiting  . ONE TOUCH LANCETS MISC Use as directed to check glucose daily. [id number]  . rosuvastatin (CRESTOR) 20 MG tablet Take one tablet daily for cholesterol  . tamoxifen (NOLVADEX) 20 MG tablet Take 20 mg by mouth daily.  . traZODone (DESYREL) 50 MG tablet as needed.    No facility-administered encounter medications on file as of 12/10/2018.      ONCOLOGIC FAMILY HISTORY:  Family History  Problem Relation Age of Onset  . Emphysema Mother   . Breast cancer Mother   . Dementia Father   . Kidney failure Father   . Colon cancer Neg Hx   . Stomach cancer Neg Hx   . Ovarian cancer Neg Hx   . Prostate cancer Neg Hx      GENETIC COUNSELING/TESTING: Not at this time  SOCIAL HISTORY:  Social History   Socioeconomic History  . Marital status: Married    Spouse name: Ronalee Belts  . Number of children: 2  . Years of education: Not on file  . Highest education level: Not on file  Occupational History  . Not on file  Social Needs  . Financial resource strain: Not on file  . Food insecurity    Worry: Not on file    Inability: Not on file  . Transportation needs    Medical: Not on file    Non-medical: Not on file  Tobacco Use  . Smoking status: Never Smoker  . Smokeless tobacco: Never Used  Substance and Sexual Activity  . Alcohol use: No  . Drug use: No  . Sexual activity: Not on file  Lifestyle  . Physical activity    Days per week: Not on file    Minutes per session: Not on file  . Stress: Not on file  Relationships  . Social Herbalist on phone: Not on file    Gets together: Not on file    Attends religious service: Not on file    Active  member of club or organization: Not on file    Attends meetings of clubs or organizations: Not on file    Relationship status: Not on file  . Intimate partner violence    Fear of current or ex partner: Not on file    Emotionally abused: Not on file    Physically abused: Not on file    Forced sexual activity: Not on file  Other Topics Concern  .  Not on file  Social History Narrative  . Not on file     OBSERVATIONS/OBJECTIVE:  Patient is in no apparent distress, speech non pressured, breathing unlabored, mood and behavior are normal  LABORATORY DATA:  None for this visit.  DIAGNOSTIC IMAGING:  None for this visit.      ASSESSMENT AND PLAN:  Ms.. Cutler is a pleasant 68 y.o. female with Stage IA right breast invasive ductal carcinoma, ER+/PR+/HER2-, diagnosed in 01/2018, treated with lumpectomy, adjuvant radiation therapy, and anti-estrogen therapy with Tamoxifen beginning in 06/2018.  She presents to the Survivorship [facility] for our initial meeting and routine follow-up post-completion of treatment for breast cancer.    1. Stage IA right breast cancer:  Ms. Desrosier is continuing to recover from definitive treatment for breast cancer. She will follow-up with her medical oncologist, Dr. 01/2019 with history and physical exam per surveillance protocol.  She will continue her anti-estrogen therapy with Tamoxifen. Thus far, she is tolerating the Tamoxifen well, with minimal side effects.   Her mammogram is due 01/2019; orders placed today.  Her breast density is category C. Today, a comprehensive survivorship care plan and treatment summary was reviewed with the patient today detailing her breast cancer diagnosis, treatment course, potential late/long-term effects of treatment, appropriate follow-up care with recommendations for the future, and patient education resources.  A copy of this summary, along with a letter will be sent to the patient's primary care provider via mail/fax/In Basket message  after today's visit.    2. Dementia: I counseled [name], that there is no way to know for sure if the forgetfulness is related to Tamoxifen, without trialing her off of the tamoxifen.  [name] is going to wait to see how her memory goes, and will call us if he thinks it is worse.    3. Bone health:  Given Ms. Mysliwiec age/history of breast cancer, she is at risk for bone demineralization.  Her last DEXA scan was 01/2018, which showed osteopenia with a t score of -1.5 in the left femur.  She was given education on specific activities to promote bone health.  4. Cancer screening:  Due to Ms. Steedman history and her age, she should receive screening for skin cancers, colon cancer, and gynecologic cancers.  The information and recommendations are listed on the patient's comprehensive care plan/treatment summary and were reviewed in detail with the patient.    5. Health maintenance and wellness promotion: Ms. Gallery was encouraged to consume 5-7 servings of fruits and vegetables per day. We reviewed the "Nutrition Rainbow" handout, as well as the handout "Take Control of Your Health and Reduce Your Cancer Risk" from the [place].  She was also encouraged to engage in moderate to vigorous exercise for 30 minutes per day most days of the week. We discussed the [employer] fitness program, which is designed for cancer survivors to help them become more physically fit after cancer treatments.  She was instructed to limit her alcohol consumption and continue to abstain from tobacco use.     6. Support services/counseling: It is not uncommon for this period of the patient's cancer care trajectory to be one of many emotions and stressors.  We discussed how this can be increasingly difficult during the times of quarantine and social distancing due to the COVID-19 pandemic.   She was given information regarding our available services and encouraged to contact me with any questions or for help  enrolling in any of our support group/programs.  Follow up instructions:    -Return to cancer center in 01/2019 for f/u with Dr. Jana Hakim  -Mammogram due in 01/2019 -She is welcome to return back to the Survivorship Clinic at any time; no additional follow-up needed at this time.  -Consider referral back to survivorship as a long-term survivor for continued surveillance  The patient was provided an opportunity to ask questions and all were answered. The patient agreed with the plan and demonstrated an understanding of the instructions.   The patient was advised to call back or seek an in-person evaluation if the symptoms worsen or if the condition fails to improve as anticipated.   I provided 18 minutes of non face-to-face telephone visit time during this encounter, and > 50% was spent counseling as documented under my assessment & plan.  Scot Dock, NP

## 2018-12-12 ENCOUNTER — Other Ambulatory Visit: Payer: Self-pay

## 2018-12-12 ENCOUNTER — Ambulatory Visit: Payer: Medicare Other | Admitting: *Deleted

## 2018-12-12 DIAGNOSIS — E039 Hypothyroidism, unspecified: Secondary | ICD-10-CM

## 2018-12-12 LAB — TSH: TSH: 3.83 mIU/L (ref 0.40–4.50)

## 2018-12-12 NOTE — Progress Notes (Signed)
Patient here for a NV to recheck her TSH.  She is not taking thyroid medication.  She complained of a a dry, itchy rash on her right leg.  Per Dale Ignacio, the patient was advised to apply Cortaid cream and try an antihistamine for the itching.  She was advised to contact oncology, if rash persist.

## 2018-12-24 ENCOUNTER — Telehealth: Payer: Self-pay

## 2018-12-24 NOTE — Telephone Encounter (Signed)
Called and left voicemail regarding pre-screening questions for appt on 6/30 

## 2018-12-25 ENCOUNTER — Inpatient Hospital Stay: Payer: Medicare Other

## 2018-12-25 ENCOUNTER — Inpatient Hospital Stay: Payer: Medicare Other | Admitting: Oncology

## 2018-12-31 ENCOUNTER — Ambulatory Visit: Payer: Medicare Other | Admitting: Internal Medicine

## 2018-12-31 ENCOUNTER — Encounter: Payer: Self-pay | Admitting: Internal Medicine

## 2018-12-31 ENCOUNTER — Other Ambulatory Visit: Payer: Self-pay

## 2018-12-31 VITALS — BP 122/68 | HR 80 | Temp 97.2°F | Resp 16 | Ht 64.5 in | Wt 144.6 lb

## 2018-12-31 DIAGNOSIS — L309 Dermatitis, unspecified: Secondary | ICD-10-CM

## 2018-12-31 MED ORDER — PREDNISONE 10 MG PO TABS: ORAL_TABLET | ORAL | 0 refills | Status: DC

## 2018-12-31 MED ORDER — TRIAMCINOLONE ACETONIDE 0.1 % EX OINT: 1.0000 "application " | TOPICAL_OINTMENT | Freq: Two times a day (BID) | CUTANEOUS | 1 refills | Status: DC

## 2018-12-31 NOTE — Patient Instructions (Signed)
Nummular Eczema Nummular eczema is a common disease that causes red, circular, crusted (plaque) lesions that may be itchy. It most commonly affects the lower legs and the backs of hands. Men tend to get their first outbreak between 24 and 67 years of age, and women tend to get their first outbreak during their teen or young adult years. What are the causes? The cause of this condition is not known. It may be related to certain skin sensitivities, such as sensitivity to:  Metals such as nickel and, rarely, mercury.  Formaldehyde.  Antibiotic medicine that is applied to the skin. What increases the risk? You are more likely to develop this condition if:  You have very dry skin.  You live in a place with dry and cold weather.  You have a personal or family history of eczema, asthma, or allergies.  You drink alcohol.  You have poor blood flow (circulation). What are the signs or symptoms? Symptoms most commonly affect the lower legs, but may also affect the hands, torso, arms, or feet. Symptoms include:  Groups of tiny, red spots.  Blister-like sores that leak fluid. These sores may grow together and form circular patches. After a long time, they may become crusty and then scaly.  Well-defined patches of pink, red, or brown skin.  Itchiness and burning, ranging from mild to severe. Itchiness may be worse at night and may cause trouble sleeping. Scratching lesions can cause bleeding. How is this diagnosed? This condition is diagnosed based on a physical exam and your medical history. Usually more tests are not needed, but you may need a swab test to check for skin infection. This test involves swabbing an affected area and testing the sample for bacteria (culture). You may work with a health care provider who specializes in the skin (dermatologist) to help diagnose and treat this condition. How is this treated? There is no cure for this condition, but treatment can help relieve  symptoms. Depending on how severe your symptoms are, your healthcare provider may suggest:  Medicine applied to the skin to reduce swelling and irritation (topical corticosteroids).  Medicine taken by mouth to reduce itching (oral antihistamines).  Antibiotic medicine to take by mouth (oral antibiotic) or to apply to your skin (topical antibiotic), if you have a skin infection.  Light therapy (phototherapy). This involves shining ultraviolet (UV) light on affected skin to reduce itchiness and inflammation.  Soaking in a bath that contains a type of salt that dries out blisters (potassium permanganate soaks). Follow these instructions at home: Medicines  Take over-the-counter and prescription medicines only as told by your health care provider.  If you were prescribed an antibiotic, take or apply it as told by your health care provider. Do not stop using the antibiotic even if you start to feel better. Skin Care   Keep your fingernails short to avoid breaking open the skin when you scratch.  Wash your hands with mild soap and water to avoid infection.  Pat your skin dry after bathing or washing your hands. Avoid rubbing your skin.  Keep your skin hydrated. To do this: ? Avoid very hot water. Take lukewarm baths or showers. ? Apply moisturizer within three minutes of bathing. This locks in moisture. ? Use a humidifier when you have the heating or air conditioning on. This will add moisture to the air.  Identify and avoid things that trigger symptoms or irritate your skin. Triggers may include taking long, hot showers or baths, or not using creams  or ointments to moisturize. Certain soaps may also trigger this condition. General instructions  Dress in clothes made of cotton or cotton blends. Avoid wearing clothes with wool fabric.  Avoid activities that may cause skin injury. Wear protective clothing when doing outdoor activities such as gardening or hiking. Cuts, scrapes, and insect  bites can make symptoms worse.  Keep all follow-up visits as told by your health care provider. This is important. Contact a health care provider if:  You develop a yellowish crust on an area of affected skin.  You have symptoms that do not go away with treatment or home care methods. Get help right away if:  You have more redness, pain, pus, or swelling. Summary  Nummular eczema is a common disease that causes red, circular, crusted (plaque) lesions that may be itchy.  The cause of this condition is not known. It may be related to certain skin sensitivities.  Treatments may include medicines to reduce swelling and irritation, avoiding triggers, and keeping your skin hydrated. This information is not intended to replace advice given to you by your health care provider. Make sure you discuss any questions you have with your health care provider. Document Released: 10/27/2016 Document Revised: 08/09/2018 Document Reviewed: 10/27/2016 Elsevier Patient Education  [place].

## 2018-12-31 NOTE — Progress Notes (Signed)
  History of Present Illness:    Patient is a very nice 68 yo MWF with HTN, HLD, Vascular Dementia   and Vitamin D Deficiency  Who is brought in by her custodial husband for her c/o pruritic rash of her shins & lower abdomen. Denies any new med , lotions, soaps or laundry detergents.   Medications  .  rosuvastatin (CRESTOR) 20 MG tablet, Take one tablet daily for cholesterol .  diphenhydrAMINE (BENADRYL) 25 MG tablet, Take by mouth as needed.  .  cetirizine (ZYRTEC) 10 MG tablet, Take 10 mg by mouth at bedtime. .  cholecalciferol (VITAMIN D) 1000 units tablet, Take 10,000 Units by mouth daily. .  DULoxetine (CYMBALTA) 60 MG capsule, Take 1 capsule (60 mg total) by mouth daily. .  memantine (NAMENDA XR) 28 MG CP24 24 hr capsule, Take 28 mg by mouth. .  Multiple Vitamin (MULTIVITAMIN) tablet, Take 1 tablet by mouth daily. .  ondansetron (ZOFRAN) 8 MG tablet, Take 1/2 to 1 tablet 2 to 3 x / day if needed for nausea / vomiting .  tamoxifen (NOLVADEX) 20 MG tablet, Take 20 mg by mouth daily. .  traZODone (DESYREL) 50 MG tablet, as needed.   Problem list She has Hyperlipidemia associated with type 2 diabetes mellitus ([place]); Allergic rhinitis; Type 2 diabetes mellitus treated without insulin ([place]); Depression, major, recurrent, in partial remission ([place]); SDAT ; Vitamin D deficiency; Essential hypertension; Medication management; Generalized anxiety disorder; Body mass index (BMI) of 24.0-24.9 in adult; CKD stage 3 due to type 2 diabetes mellitus ([place]); and Malignant neoplasm of upper-outer quadrant of right breast in female, estrogen receptor positive ([place]) on their problem list.   Observations/Objective:   BP 122/68   Pulse 80   Temp (!) 97.2 F (36.2 C)   Resp 16   Ht 5' 4.5" (1.638 m)   Wt 144 lb 9.6 oz (65.6 kg)   BMI 24.44 kg/m   HEENT - WNL. Neck - supple.  Chest - Clear equal BS. Cor - Nl HS. RRR w/o sig MGR. PP 1(+). No edema. MS- FROM w/o deformities.  Gait Nl. Neuro -  Nl  w/o focal abnormalities. Skin  - appears typical nummular eczema of the shins - Rt >Lt and fer scattered excorated lesions of her Abdomen.  Assessment and Plan:  1. Eczema, Nummular   - predniSONE (DELTASONE) 10 MG tablet; 1 tab 3 x day for 5 days, then  2 x day for 5 days, then 1 x day for 5 days, then every other day  Dispense: 40 tablet  - triamcinolone ointment (KENALOG) 0.1 %; Apply 1 application topically 2 (two) times daily. Apply sparingly to Rash of legs 2 x /day & cover with Saran Wrap  Dispense: 453.6 g; Refill: 1  Follow Up Instructions: Discussed ointment applications & occlusive wraps with saran wrap.      I discussed the assessment and treatment plan with the patient. The patient was provided an opportunity to ask questions and all were answered. The patient agreed with the plan and demonstrated an understanding of the instructions. The patient was advised to call back or seek an in-person evaluation if the symptoms worsen or if the condition fails to improve as anticipated.   Kirtland Bouchard, MD

## 2019-01-20 ENCOUNTER — Encounter (HOSPITAL_COMMUNITY): Payer: Self-pay | Admitting: Emergency Medicine

## 2019-01-20 ENCOUNTER — Other Ambulatory Visit: Payer: Self-pay

## 2019-01-20 ENCOUNTER — Emergency Department (HOSPITAL_COMMUNITY)
Admission: EM | Admit: 2019-01-20 | Discharge: 2019-01-20 | Disposition: A | Discharge status: Home / Self Care | Payer: Medicare Other | Attending: Emergency Medicine | Admitting: Emergency Medicine

## 2019-01-20 ENCOUNTER — Emergency Department (HOSPITAL_COMMUNITY): Payer: Medicare Other

## 2019-01-20 DIAGNOSIS — R1013 Epigastric pain: Secondary | ICD-10-CM | POA: Insufficient documentation

## 2019-01-20 DIAGNOSIS — R112 Nausea with vomiting, unspecified: Secondary | ICD-10-CM | POA: Diagnosis not present

## 2019-01-20 DIAGNOSIS — Z79899 Other long term (current) drug therapy: Secondary | ICD-10-CM | POA: Diagnosis not present

## 2019-01-20 DIAGNOSIS — N183 Chronic kidney disease, stage 3 (moderate): Secondary | ICD-10-CM | POA: Insufficient documentation

## 2019-01-20 DIAGNOSIS — E1122 Type 2 diabetes mellitus with diabetic chronic kidney disease: Secondary | ICD-10-CM | POA: Diagnosis not present

## 2019-01-20 DIAGNOSIS — Z853 Personal history of malignant neoplasm of breast: Secondary | ICD-10-CM | POA: Diagnosis not present

## 2019-01-20 DIAGNOSIS — I129 Hypertensive chronic kidney disease with stage 1 through stage 4 chronic kidney disease, or unspecified chronic kidney disease: Secondary | ICD-10-CM | POA: Diagnosis not present

## 2019-01-20 DIAGNOSIS — Z96652 Presence of left artificial knee joint: Secondary | ICD-10-CM | POA: Diagnosis not present

## 2019-01-20 LAB — URINALYSIS, ROUTINE W REFLEX MICROSCOPIC
Bacteria, UA: NONE SEEN
Glucose, UA: NEGATIVE mg/dL
Hgb urine dipstick: NEGATIVE
Ketones, ur: 5 mg/dL — AB
Leukocytes,Ua: NEGATIVE
Nitrite: NEGATIVE
Protein, ur: 30 mg/dL — AB
Specific Gravity, Urine: 1.025 (ref 1.005–1.030)
pH: 5 (ref 5.0–8.0)

## 2019-01-20 LAB — CBC
HCT: 39.4 % (ref 36.0–46.0)
Hemoglobin: 13.8 g/dL (ref 12.0–15.0)
MCH: 32.1 pg (ref 26.0–34.0)
MCHC: 35 g/dL (ref 30.0–36.0)
MCV: 91.6 fL (ref 80.0–100.0)
Platelets: 178 10*3/uL (ref 150–400)
RBC: 4.3 MIL/uL (ref 3.87–5.11)
RDW: 12.6 % (ref 11.5–15.5)
WBC: 12.4 10*3/uL — ABNORMAL HIGH (ref 4.0–10.5)
nRBC: 0 % (ref 0.0–0.2)

## 2019-01-20 LAB — COMPREHENSIVE METABOLIC PANEL
ALT: 13 U/L (ref 0–44)
AST: 19 U/L (ref 15–41)
Albumin: 3.7 g/dL (ref 3.5–5.0)
Alkaline Phosphatase: 44 U/L (ref 38–126)
Anion gap: 10 (ref 5–15)
BUN: 17 mg/dL (ref 8–23)
CO2: 23 mmol/L (ref 22–32)
Calcium: 9.3 mg/dL (ref 8.9–10.3)
Chloride: 106 mmol/L (ref 98–111)
Creatinine, Ser: 1.29 mg/dL — ABNORMAL HIGH (ref 0.44–1.00)
GFR calc Af Amer: 50 mL/min — ABNORMAL LOW (ref >60)
GFR calc non Af Amer: 43 mL/min — ABNORMAL LOW (ref >60)
Glucose, Bld: 184 mg/dL — ABNORMAL HIGH (ref 70–99)
Potassium: 3.9 mmol/L (ref 3.5–5.1)
Sodium: 139 mmol/L (ref 135–145)
Total Bilirubin: 1.7 mg/dL — ABNORMAL HIGH (ref 0.3–1.2)
Total Protein: 6.6 g/dL (ref 6.5–8.1)

## 2019-01-20 LAB — LIPASE, BLOOD: Lipase: 31 U/L (ref 11–51)

## 2019-01-20 MED ORDER — SODIUM CHLORIDE 0.9% FLUSH: 3.0000 mL | Freq: Once | INTRAVENOUS | Status: DC

## 2019-01-20 MED ORDER — FAMOTIDINE IN NACL 20-0.9 MG/50ML-% IV SOLN
20.0000 mg | Freq: Once | INTRAVENOUS | Status: AC
  Administered 2019-01-20: 16:00:00 20 mg via INTRAVENOUS
  Filled 2019-01-20: qty 50

## 2019-01-20 MED ORDER — ONDANSETRON HCL 4 MG/2ML IJ SOLN
4.0000 mg | Freq: Once | INTRAMUSCULAR | Status: AC
  Administered 2019-01-20: 4 mg via INTRAVENOUS
  Filled 2019-01-20: qty 2

## 2019-01-20 MED ORDER — SODIUM CHLORIDE 0.9 % IV BOLUS
1000.0000 mL | Freq: Once | INTRAVENOUS | Status: AC
  Administered 2019-01-20: 16:00:00 1000 mL via INTRAVENOUS

## 2019-01-20 MED ORDER — METOCLOPRAMIDE HCL 10 MG PO TABS: 10.0000 mg | ORAL_TABLET | Freq: Four times a day (QID) | ORAL | 0 refills | Status: DC | PRN

## 2019-01-20 NOTE — ED Notes (Signed)
Pt st's she feels much better after meds and fluids.

## 2019-01-20 NOTE — ED Provider Notes (Signed)
[facility] EMERGENCY DEPARTMENT Provider Note   CSN: [id number] Arrival date & time: 01/20/19  1409    History   Chief Complaint Chief Complaint  Patient presents with  . Emesis    HPI Marie [name] is a 68 y.o. female history of dementia, depression, diabetes, here presenting with nausea and vomiting and epigastric pain.  Patient states that she has been nauseated and epigastric pain for the last 2 to 3 days.  She states that she lost power yesterday and has been vomiting since this morning.  She may have taken some ODT Zofran with no relief.  Denies any fevers or chills or diarrhea or eating bad food or recent travel.  Denies any sick contacts.     The history is provided by the patient.    Past Medical History:  Diagnosis Date  . Dementia ([place])   . Depression   . Diabetes mellitus   . Hypertension   . Thyroid disease   . Type II or unspecified type diabetes mellitus without mention of complication, not stated as uncontrolled    stopped metformin due to diarrhea, no meds now  . Vitamin D deficiency     Patient Active Problem List   Diagnosis Date Noted  . Malignant neoplasm of upper-outer quadrant of right breast in female, estrogen receptor positive ([place]) 02/13/2018  . CKD stage 3 due to type 2 diabetes mellitus ([place]) 10/04/2017  . Body mass index (BMI) of 24.0-24.9 in adult 05/05/2015  . Generalized anxiety disorder 08/04/2014  . Essential hypertension 04/28/2014  . Medication management 04/28/2014  . Vitamin D deficiency 10/04/2013  . Type 2 diabetes mellitus treated without insulin ([place])   . Depression, major, recurrent, in partial remission ([place])   . SDAT    . Hyperlipidemia associated with type 2 diabetes mellitus ([place]) 07/28/2007  . Allergic rhinitis 07/28/2007    Past Surgical History:  Procedure Laterality Date  . BREAST LUMPECTOMY WITH RADIOACTIVE SEED AND SENTINEL LYMPH NODE BIOPSY Right 03/08/2018   Procedure: BREAST LUMPECTOMY  WITH RADIOACTIVE SEED AND SENTINEL LYMPH NODE BIOPSY;  Surgeon: [name], MD;  Location: [facility];  Service: General;  Laterality: Right;  . FRACTURE SURGERY  2009   right arm; rod  . TOTAL KNEE ARTHROPLASTY  june 2013   left     OB History   No obstetric history on file.      Home Medications    Prior to Admission medications   Medication Sig Start Date End Date Taking? Authorizing Provider  Blood Glucose Monitoring Suppl (ONETOUCH VERIO) w/Device KIT 1 kit by Does not apply route as needed. Use to check glucose QD DX-E11.22 05/25/17   [name], MD  cetirizine (ZYRTEC) 10 MG tablet Take 10 mg by mouth at bedtime.    [provider]  cholecalciferol (VITAMIN D) 1000 units tablet Take 10,000 Units by mouth daily.    [provider]  diphenhydrAMINE (BENADRYL) 25 MG tablet Take by mouth as needed.     [provider]  DULoxetine (CYMBALTA) 60 MG capsule Take 1 capsule (60 mg total) by mouth daily. 01/09/18   [name], MD  glucose blood test strip CHECK BLOOD SUGAR 1 TIME DAILY-DX e11.22 06/13/17   Liane Comber, NP  memantine (NAMENDA XR) 28 MG CP24 24 hr capsule Take 28 mg by mouth.    [provider]  Multiple Vitamin (MULTIVITAMIN) tablet Take 1 tablet by mouth daily.    [provider]  ondansetron (ZOFRAN) 8 MG tablet Take 1/2 to 1 tablet 2 to 3 x / day if needed for nausea / vomiting 06/06/17   [name], MD  ONE [facility] LANCETS MISC Use as directed to check glucose daily. KG-Y18.56 06/13/17   Liane Comber, NP  predniSONE (DELTASONE) 10 MG tablet 1 tab 3 x day for 5 days, then  2 x day for 5 days, then 1 x day for 5 days, then every other day 12/31/18   [name], MD  rosuvastatin (CRESTOR) 20 MG tablet Take one tablet daily for cholesterol 07/30/18   [name], MD  tamoxifen (NOLVADEX) 20 MG tablet Take 20 mg by mouth daily.    [provider]  traZODone (DESYREL) 50 MG  tablet as needed.  03/05/18   [provider]  triamcinolone ointment (KENALOG) 0.1 % Apply 1 application topically 2 (two) times daily. Apply sparingly to Rash of legs 2 x /day & cover with [place] Mule 12/31/18   [name], MD    Family History Family History  Problem Relation Age of Onset  . Emphysema Mother   . Breast cancer Mother   . Dementia Father   . Kidney failure Father   . Colon cancer Neg Hx   . Stomach cancer Neg Hx   . Ovarian cancer Neg Hx   . Prostate cancer Neg Hx     Social History Social History   Tobacco Use  . Smoking status: Never Smoker  . Smokeless tobacco: Never Used  Substance Use Topics  . Alcohol use: No  . Drug use: No     Allergies   Penicillins   Review of Systems Review of Systems  Gastrointestinal: Positive for vomiting.  All other systems reviewed and are negative.    Physical Exam Updated Vital Signs BP (!) 141/90 (BP Location: Right Arm)   Pulse (!) 103   Temp 98 F (36.7 C) (Oral)   Resp 15   SpO2 96%   Physical Exam Vitals signs and nursing note reviewed.  HENT:     Head: Normocephalic.     Nose: Nose normal.     Mouth/Throat:     Mouth: Mucous membranes are dry.     Comments: MM slightly dry  Eyes:     Extraocular Movements: Extraocular movements intact.     Pupils: Pupils are equal, round, and reactive to light.  Neck:     Musculoskeletal: Normal range of motion.  Cardiovascular:     Rate and Rhythm: Normal rate and regular rhythm.     Pulses: Normal pulses.     Heart sounds: Normal heart sounds.  Pulmonary:     Effort: Pulmonary effort is normal.     Breath sounds: Normal breath sounds.  Abdominal:     General: Abdomen is flat.     Palpations: Abdomen is soft.     Comments: Mild epigastric tenderness   Skin:    General: Skin is warm.     Capillary Refill: Capillary refill takes less than 2 seconds.  Neurological:     General: No focal deficit present.     Mental Status: She is alert and  oriented to person, place, and time.  Psychiatric:        Mood and Affect: Mood normal.        Behavior: Behavior normal.      ED Treatments / Results  Labs (all labs ordered are listed, but only abnormal results are displayed) Labs Reviewed  COMPREHENSIVE METABOLIC PANEL - Abnormal;  Notable for the following components:      Result Value   Glucose, Bld 184 (*)    Creatinine, Ser 1.29 (*)    Total Bilirubin 1.7 (*)    GFR calc non Af Amer 43 (*)    GFR calc Af Amer 50 (*)    All other components within normal limits  CBC - Abnormal; Notable for the following components:   WBC 12.4 (*)    All other components within normal limits  URINALYSIS, ROUTINE W REFLEX MICROSCOPIC - Abnormal; Notable for the following components:   Bilirubin Urine SMALL (*)    Ketones, ur 5 (*)    Protein, ur 30 (*)    All other components within normal limits  LIPASE, BLOOD    EKG EKG Interpretation  Date/Time:  Sunday January 20 2019 17:20:35 EDT Ventricular Rate:  71 PR Interval:    QRS Duration: 94 QT Interval:  416 QTC Calculation: 453 R Axis:   56 Text Interpretation:  Sinus rhythm No significant change since last tracing Confirmed by Wandra Arthurs [phone]) on 01/20/2019 6:10:22 PM   Radiology Dg Chest Port 1 View  Result Date: 01/20/2019 CLINICAL DATA:  Altered mental status, dementia, fever, and nausea for 4 days, hypertension, type II diabetes mellitus EXAM: PORTABLE CHEST 1 VIEW COMPARISON:  Portable exam 1512 hours compared to 11/14/2015 FINDINGS: Normal heart size, mediastinal contours, and pulmonary vascularity. Lungs clear. No infiltrate, pleural effusion or pneumothorax. Bones unremarkable. IMPRESSION: No acute abnormalities. Electronically Signed   By: Lavonia Dana M.D.   On: 01/20/2019 15:32   Dg Abd Portable 1 View  Result Date: 01/20/2019 CLINICAL DATA:  Vomiting, dementia, fever, diabetes mellitus, hypertension EXAM: PORTABLE ABDOMEN - 1 VIEW COMPARISON:  Portable exam 1514 hours  without priors for comparison FINDINGS: Nonobstructive bowel gas pattern. No bowel dilatation or bowel wall thickening. Bones demineralized. No urinary tract calcification. IMPRESSION: Normal bowel gas pattern. Electronically Signed   By: Lavonia Dana M.D.   On: 01/20/2019 15:33    Procedures Procedures (including critical care time)  Medications Ordered in ED Medications  sodium chloride flush (NS) 0.9 % injection 3 mL (has no administration in time range)  ondansetron (ZOFRAN) injection 4 mg (4 mg Intravenous Given 01/20/19 1546)  sodium chloride 0.9 % bolus 1,000 mL (0 mLs Intravenous Stopped 01/20/19 1723)  famotidine (PEPCID) IVPB 20 mg premix (0 mg Intravenous Stopped 01/20/19 1723)     Initial Impression / Assessment and Plan / ED Course  I have reviewed the triage vital signs and the nursing notes.  Pertinent labs & imaging results that were available during my care of the patient were reviewed by me and considered in my medical decision making (see chart for details).       Marie [name] is a 68 y.o. female here with epigastric pain, vomiting. Likely viral gastro. Consider pancreatitis and gastritis as well. Will get labs, lipase. Will hydrate and reassess.   6:11 PM Patient's labs were unremarkable. Some ketones in her urine. Given IVF and zofran and tolerated PO. Stable for discharge. Likely viral gastro    Final Clinical Impressions(s) / ED Diagnoses   Final diagnoses:  None    ED Discharge Orders    None       Drenda Freeze, MD 01/20/19 [id number]

## 2019-01-20 NOTE — ED Triage Notes (Signed)
Pt has dementia. Oriented to self and situation, disoriented to time/date. Pt husband and pt report she has been experiencing nausea with emesis X 5 for 4 days. Denies fevers. Denies diarrhea.

## 2019-01-20 NOTE — Discharge Instructions (Signed)
Stay hydrated   Take zofran or reglan for nausea., You can use the zofran that you have at home   See your doctor  Return to ER if you have severe abdominal pain, vomiting, fever, dehydration

## 2019-01-28 NOTE — Progress Notes (Signed)
No show

## 2019-01-29 ENCOUNTER — Encounter: Payer: Self-pay | Admitting: Oncology

## 2019-01-29 ENCOUNTER — Inpatient Hospital Stay: Payer: Medicare Other | Attending: Oncology

## 2019-01-29 ENCOUNTER — Inpatient Hospital Stay (HOSPITAL_BASED_OUTPATIENT_CLINIC_OR_DEPARTMENT_OTHER): Payer: Medicare Other | Admitting: Oncology

## 2019-01-29 DIAGNOSIS — Z170 Estrogen receptor positive status [ER+]: Secondary | ICD-10-CM

## 2019-01-29 DIAGNOSIS — C50411 Malignant neoplasm of upper-outer quadrant of right female breast: Secondary | ICD-10-CM

## 2019-01-31 ENCOUNTER — Encounter: Payer: Self-pay | Admitting: Internal Medicine

## 2019-01-31 ENCOUNTER — Ambulatory Visit (INDEPENDENT_AMBULATORY_CARE_PROVIDER_SITE_OTHER): Payer: Self-pay | Admitting: Internal Medicine

## 2019-01-31 DIAGNOSIS — R69 Illness, unspecified: Secondary | ICD-10-CM

## 2019-01-31 NOTE — Progress Notes (Signed)
       R  E  S  C  H  E D  U  L  E  D                                                                                                                                                                                                                                 This very nice 68 y.o.female presents for 3 month follow up with HTN, HLD, Pre-Diabetes and Vitamin D Deficiency.       Patient is treated for HTN & BP has been controlled at home. Today's  . Patient has had no complaints of any cardiac type chest pain, palpitations, dyspnea / orthopnea / PND, dizziness, claudication, or dependent edema.      Hyperlipidemia is controlled with diet & meds. Patient denies myalgias or other med SE's. Last Lipids were  Lab Results  Component Value Date   CHOL 139 10/29/2018   HDL 54 10/29/2018   LDLCALC 71 10/29/2018   TRIG 65 10/29/2018   CHOLHDL 2.6 10/29/2018    Also, the patient has history of T2_NIDDM PreDiabetes and has had no symptoms of reactive hypoglycemia, diabetic polys, paresthesias or visual blurring.  Last A1c was  Lab Results  Component Value Date   HGBA1C 6.1 (H) 10/29/2018       Further, the patient also has history of Vitamin D Deficiency and supplements vitamin D without any suspected side-effects. Last vitamin D was Lab Results  Component Value Date   VD25OH 38 10/29/2018

## 2019-03-27 NOTE — Progress Notes (Signed)
Assessment and Plan:  Deania was seen today for skin problem.  Diagnoses and all orders for this visit:  Skin lesion of chest wall Persistent chest lesion suspicious for possible basal cell; discussed with Dr. Melford Aase and will schedule longer appointment for removal in procedure room  Need for immunization against influenza -     Flu vaccine HIGH DOSE PF  Further disposition pending results of labs. Discussed med's effects and SE's.   Over 15 minutes of exam, counseling, chart review, and critical decision making was performed.   Future Appointments  Date Time Provider [place]  08/19/2019  2:00 PM [name], MD [facility] None  11/19/2019 10:00 AM Liane Comber, NP [facility] None    ------------------------------------------------------------------------------------------------------------------   HPI  BP 104/62   Pulse 88   Temp (!) 97.5 F (36.4 C)   Ht 5' 4.5" (1.638 m)   Wt 146 lb 9.6 oz (66.5 kg)   SpO2 98%   BMI 24.78 kg/m   68 y.o.female presents for evaluation of lesion to chest wall x 3 weeks that is not resolving; she is also requesting flu vaccine today.   Past Medical History:  Diagnosis Date  . Dementia ([place])   . Depression   . Diabetes mellitus   . Hypertension   . Thyroid disease   . Type II or unspecified type diabetes mellitus without mention of complication, not stated as uncontrolled    stopped metformin due to diarrhea, no meds now  . Vitamin D deficiency      Allergies  Allergen Reactions  . Penicillins Rash    Swelling in face - last 10 years    Current Outpatient Medications on File Prior to Visit  Medication Sig  . Blood Glucose Monitoring Suppl (ONETOUCH VERIO) w/Device KIT 1 kit by Does not apply route as needed. Use to check glucose QD DX-E11.22  . cholecalciferol (VITAMIN D) 1000 units tablet Take 10,000 Units by mouth daily.  . diphenhydrAMINE (BENADRYL) 25 MG tablet Take by mouth as needed.   . DULoxetine  (CYMBALTA) 60 MG capsule Take 1 capsule (60 mg total) by mouth daily.  Marland Kitchen glucose blood test strip CHECK BLOOD SUGAR 1 TIME DAILY-DX e11.22  . memantine (NAMENDA XR) 28 MG CP24 24 hr capsule Take 28 mg by mouth.  . metoCLOPramide (REGLAN) 10 MG tablet Take 1 tablet (10 mg total) by mouth every 6 (six) hours as needed for nausea (nausea/headache).  . Multiple Vitamin (MULTIVITAMIN) tablet Take 1 tablet by mouth daily.  . ondansetron (ZOFRAN) 8 MG tablet Take 1/2 to 1 tablet 2 to 3 x / day if needed for nausea / vomiting (Patient taking differently: as needed. Take 1/2 to 1 tablet 2 to 3 x / day if needed for nausea / vomiting)  . ONE TOUCH LANCETS MISC Use as directed to check glucose daily. DX-E11.22  . rosuvastatin (CRESTOR) 20 MG tablet Take one tablet daily for cholesterol  . tamoxifen (NOLVADEX) 20 MG tablet Take 20 mg by mouth daily.  . traZODone (DESYREL) 50 MG tablet as needed.   . triamcinolone ointment (KENALOG) 0.1 % Apply 1 application topically 2 (two) times daily. Apply sparingly to Rash of legs 2 x /day & cover with Saran Wrap  . cetirizine (ZYRTEC) 10 MG tablet Take 10 mg by mouth at bedtime.  . predniSONE (DELTASONE) 10 MG tablet 1 tab 3 x day for 5 days, then  2 x day for 5 days, then 1 x day for 5 days, then every other  day   No current facility-administered medications on file prior to visit.     ROS: all negative except above.   Physical Exam:  BP 104/62   Pulse 88   Temp (!) 97.5 F (36.4 C)   Ht 5' 4.5" (1.638 m)   Wt 146 lb 9.6 oz (66.5 kg)   SpO2 98%   BMI 24.78 kg/m   General Appearance: Well nourished, in no apparent distress. Eyes: conjunctiva no swelling or erythema ENT/Mouth: Hearing normal.  Neck: Supple Respiratory: Respiratory effort normal, BS equal bilaterally without rales, rhonchi, wheezing or stridor.  Cardio: RRR with no MRGs. Brisk peripheral pulses without edema.  Abdomen: Soft, + BS.  Non tender Lymphatics: Non tender without  lymphadenopathy.  Musculoskeletal: normal gait.  Skin: Warm, dry; she has fleshy lesion approx 8 mm, raised with ulcerated center Neuro:  Sensation intact.  Psych: Awake and oriented X 3, normal affect, Insight and Judgment appropriate.     Izora Ribas, NP 5:17 PM [facility]

## 2019-03-28 ENCOUNTER — Encounter: Payer: Self-pay | Admitting: Adult Health

## 2019-03-28 ENCOUNTER — Ambulatory Visit (INDEPENDENT_AMBULATORY_CARE_PROVIDER_SITE_OTHER): Payer: Medicare Other | Admitting: Adult Health

## 2019-03-28 ENCOUNTER — Other Ambulatory Visit: Payer: Self-pay

## 2019-03-28 VITALS — BP 104/62 | HR 88 | Temp 97.5°F | Ht 64.5 in | Wt 146.6 lb

## 2019-03-28 DIAGNOSIS — L989 Disorder of the skin and subcutaneous tissue, unspecified: Secondary | ICD-10-CM | POA: Diagnosis not present

## 2019-03-28 DIAGNOSIS — Z23 Encounter for immunization: Secondary | ICD-10-CM | POA: Diagnosis not present

## 2019-03-28 NOTE — Patient Instructions (Signed)
Basal Cell Carcinoma Basal cell carcinoma is the most common form of skin cancer. It begins in the basal cells, which are at the bottom of the outer skin layer (epidermis). Basal cell carcinoma can almost always be cured. It rarely spreads to other areas of the body (metastasizes). It may come back at the same location (recur), but it can be treated again if this happens. Basal cell carcinoma occurs most often on parts of the body that are frequently exposed to the sun, such as:  Parts of the head, including the scalp or face.  Ears.  Neck.  Arms or legs.  Backs of the hands. What are the causes? This condition is usually caused by exposure to ultraviolet (UV) light. UV light may come from the sun or from tanning beds. Other causes include:  Exposure to a highly poisonous metal (arsenic).  Exposure to high-energy X-rays (radiation).  Exposure to toxic tars and oils.  Certain genetic conditions, such as a condition that makes a person sensitive to sunlight (xeroderma pigmentosum). What increases the risk? You are more likely to develop this condition if:  You are older than 67 years of age.  You have: ? Fair skin (light complexion). ? Blond or red hair. ? Blue, green, or gray eyes. ? Childhood freckling. ? Had sun exposure over long periods of time, especially during childhood. ? Had repeated sunburns. ? A weakened immune system. ? Been exposed to certain chemicals, such as tar, soot, and arsenic. ? Chronic inflammatory conditions. ? Chronic infections.  You use tanning beds. What are the signs or symptoms? The main symptom of this condition is a growth or lesion on the skin.  The shape and color of the growth or lesion may vary. The main types include: ? An open sore that may remain open for 3 weeks or longer. The sore may bleed or crust. This type of lesion can be an early sign of basal cell carcinoma. Basal cell carcinoma often shows up as a sore that does not  heal. ? A reddish area that may crust, itch, or cause discomfort. This may occur on areas that are exposed to the sun. These patches might be easier to feel than to see. ? A shiny or clear bump that is red, white, or pink. In people who have dark hair, the bump is often tan, black, or brown. These bumps can look like moles. ? A pink growth with a raised border. The growth will have a crusted and indented area in the center. Small blood vessels may appear on the surface of the growth as it gets bigger. ? A scar-like area that looks like shiny, stretched skin. The area may be white, yellow, or waxy. It often has irregular borders. This may be a sign of more aggressive basal cell carcinoma. How is this diagnosed? This condition may be diagnosed with:  A physical exam.  Removal of a tissue sample to be examined under a microscope (biopsy). How is this treated? Treatment for this condition involves removing the cancerous tissue. The method that is used for this depends on the type, size, location, and number of tumors. Possible treatments include:  Mohs surgery. In this procedure, the cancerous skin cells are removed layer by layer until all of the tumor has been removed.  Surgical removal (excision) of the tumor. This involves removing the entire tumor and a small amount of normal skin that surrounds it.  Cryosurgery. This involves freezing the tumor with liquid nitrogen.  Plastic surgery.   Plastic surgery. The tumor is removed, and healthy skin from another part of the body is used to cover the wound. This may be done for large tumors that are in areas where it is not possible to stretch the nearby skin to sew the edges of the wound together.  Radiation. This may be used for tumors on the face.  Photodynamic therapy. A chemical cream is applied to the skin, and light exposure is used to activate the chemical.  Electrodesiccation and curettage. This involves alternately scraping and burning the tumor while using  an electric current to control bleeding.  Chemical treatments, such as imiquimod cream and interferon injections. These may be used to remove superficial tumors with minimal scarring. Follow these instructions at home:  Avoid direct exposure to the sun.  Do self-exams as told by your health care provider. Look for new spots or changes in your skin.  Keep all follow-up visits as told by your health care provider. This is important. How is this prevented?   Avoid the sun when it is the strongest. This is usually between 10 a.m. and 4 p.m.  When you are out in the sun, use a sunscreen that has a sun protection factor (SPF) of at least 48.  Apply sunscreen at least 30 minutes before exposure to the sun.  Reapply sunscreen every 2-4 hours while you are outside. Also reapply it after swimming and after excessive sweating.  Always wear hats, protective clothing, and UV-blocking sunglasses when you are outdoors.  Do not use tanning beds. Contact a health care provider if you:  Notice any new spots or any changes in your skin.  Have had a basal cell carcinoma tumor removed, and you notice a new growth in the same location. Get help right away if you have a spot that:  Is sore and does not heal.  Bleeds easily with minor injury. Summary  Basal cell carcinoma is the most common form of skin cancer. It begins in the bottom of the outer skin layer (epidermis). Basal cell carcinoma can almost always be cured.  This condition is usually caused by exposure to ultraviolet (UV) light. It mostly affects the face, scalp, neck, ears, arms, legs, or backs of the hands.  The main symptom of this condition is a growth or lesion on the skin that can vary in shape and color.  You can prevent this cancer by avoiding direct exposure to the sun, applying sunscreen of at least 30 SPF, and wearing protective clothing.  Apply sunscreen 30 minutes before you go out into the sun, and reapply every 2-4  hours while you are outside. This information is not intended to replace advice given to you by your health care provider. Make sure you discuss any questions you have with your health care provider. Document Released: 12/18/2002 Document Revised: 11/01/2017 Document Reviewed: 11/01/2017 Elsevier Patient Education  2020 [employer].

## 2019-04-01 ENCOUNTER — Encounter: Payer: Self-pay | Admitting: Internal Medicine

## 2019-04-01 NOTE — Progress Notes (Signed)
Subjective:    Patient ID: Marie [name], female    DOB: 1950/10/03, 68 y.o.   MRN: [id number]  HPI                 This very nice 68 y.o. MWF followed for HTN, HLD, T2_NIDDM, Depression, Dementia,  Prediabetes  and Vitamin D Deficiency.She denies any c/o HA, dizziness, CP, palpitations, dizziness or dependent edema. She also presents with concerns re: recent growth of a pink raised fleshy lesion of the upper anterior mid chest.   Outpatient Medications Prior to Visit  Medication Sig  . cetirizine  10 MG Take  at bedtime.  Marland Kitchen VITAMIN D 1000 units Take 10,000 Units  daily.  . diphenhydrAMINE  25 MG tab Take by mouth as needed.   . DULoxetine 60 MG cap Take 1 capsule  daily.  . memantine-XR 28 MG 24 hr cap Take Daily  . metoCLOPramide  10 MG tab Take 1 tablet every 6  hours as needed for nausea  . Multiple Vitamin  tab Take 1 tablet daily.  . ondansetron  8 MG tablet Take 1/2 to 1 tablet 2 to 3 x / day if needed for nausea  . rosuvastatin 20 MG tablet Take one tablet daily for cholesterol  . tamoxifen 20 MG tablet Take 20 mg by mouth daily.  . traZODone  50 MG tablet as needed.   . triamcinolone oint  0.1 % Apply 1 application topically 2 (two) times daily   Allergies  Allergen Reactions  . Penicillins Rash    Swelling in face - last 10 years   Past Medical History:  Diagnosis Date  . Dementia ([place])   . Depression   . Diabetes mellitus   . Hypertension   . Thyroid disease   . Type II or unspecified type diabetes mellitus without mention of complication, not stated as uncontrolled    stopped metformin due to diarrhea, no meds now  . Vitamin D deficiency    Past Surgical History:  Procedure Laterality Date  . BREAST LUMPECTOMY WITH RADIOACTIVE SEED AND SENTINEL LYMPH NODE BIOPSY Right 03/08/2018   Procedure: BREAST LUMPECTOMY WITH RADIOACTIVE SEED AND SENTINEL LYMPH NODE BIOPSY;  Surgeon: [name], MD;  Location: [facility];  Service: General;  Laterality:  Right;  . FRACTURE SURGERY  2009   right arm; rod  . TOTAL KNEE ARTHROPLASTY  june 2013   left   Review of Systems   10 point systems review negative except as above.    Objective:   Physical Exam  BP 122/60   Pulse 76   Temp (!) 97 F (36.1 C)   Resp 16   Ht 5' 4.5" (1.638 m)   Wt 148 lb 6.4 oz (67.3 kg)   BMI 25.08 kg/m   HEENT - WNL. Neck - supple.  Chest - Clear equal BS. Cor - Nl HS. RRR w/o sig MGR. PP 1(+). No edema. MS- FROM w/o deformities.  Gait Nl. Neuro -  Nl w/o focal abnormalities. Skin - there is a 8-9 mm raised pink fleshy lesion with central ulceration of the upper middle chest at level 2sd IC space sl rightward of midline.   Procedure  (CPT:  U2115493)    After informed consent from patient & her caretaker husband the lesion was prepped with alcohol amd anesthetized locally with 2 ml  Marcaine 0.5% [place] & intradermal. Then with a #10 scalpel the lesion was excised in an elliptical fashion oriented transverse  deep full thickness to the Subcut. Then the opposing skin edges were aligned and everted and secured with # 5 vertical mattress sutures of Nylon 3-0.  Wound was cleaned with soap & water& antibiotic ung applied and covered with a 4" x 8" Tegaderm dressing. Patient & spouse were instructed in Post op care. Recommended to ROV in 12-14 days.     Assessment & Plan:   1. Essential hypertension  2. Skin cancer of anterior chest  - Dermatology pathology

## 2019-04-02 ENCOUNTER — Other Ambulatory Visit: Payer: Self-pay | Admitting: Internal Medicine

## 2019-04-02 ENCOUNTER — Other Ambulatory Visit: Payer: Self-pay

## 2019-04-02 ENCOUNTER — Ambulatory Visit (INDEPENDENT_AMBULATORY_CARE_PROVIDER_SITE_OTHER): Payer: Medicare Other | Admitting: Internal Medicine

## 2019-04-02 VITALS — BP 122/60 | HR 76 | Temp 97.0°F | Resp 16 | Ht 64.5 in | Wt 148.4 lb

## 2019-04-02 DIAGNOSIS — I10 Essential (primary) hypertension: Secondary | ICD-10-CM | POA: Diagnosis not present

## 2019-04-02 DIAGNOSIS — C44509 Unspecified malignant neoplasm of skin of other part of trunk: Secondary | ICD-10-CM | POA: Diagnosis not present

## 2019-04-10 ENCOUNTER — Telehealth: Payer: Self-pay | Admitting: *Deleted

## 2019-04-10 NOTE — Telephone Encounter (Signed)
Spouse called and requested pain medication for the patient , due to pain at her excision site. Per Dr Melford Aase, the patient can take Tylenol 500 mg 2 tablets 4 times a day. A message was left to inform the spouse.

## 2019-04-14 NOTE — Progress Notes (Signed)
   Subjective:    Patient ID: Marie [name], female    DOB: 02-21-1951, 68 y.o.   MRN: [id number]  HPI   Patient is a very nice 68 yo MWF who returns 10 days post excision of a SSC of the chest with clear margins .  Medication Sig  . cetirizine  10 MG  Take 10 mg by mouth at bedtime.  Marland Kitchen VITAMIN D 1000 units Take 10,000 Units by mouth daily.  . diphenhydrAMINE  25 MG  Take by mouth as needed.   . DULoxetine 60 MG  Take 1 capsule (60 mg total) by mouth daily.  . memantine-XR 28 MG 24 hr cap Take 28 mg by mouth.  . metoCLOPramide 10 MG  Take 1 tablet (10 mg total) by mouth every 6 (six) hours as needed for nausea (nausea/headache).  . Multiple Vitamin Take 1 tablet by mouth daily.  . ondansetron  8 MG Take 1/2 to 1 tablet 2 to 3 x / day if needed for nausea / vomiting   . rosuvastatin  20 MG Take one tablet daily for cholesterol  . tamoxifen 20 MG  Take 20 mg by mouth daily.  . traZODone50 MG as needed.   . triamcinolone oint 0.1 % Apply 1 application topically 2 (two) times daily   Allergies  Allergen Reactions  . Penicillins Rash    Swelling in face - last 10 years   Review of Systems   10 point systems review negative except as above.    Objective:   Physical Exam  BP 130/80   Pulse 72   Temp (!) 97.5 F (36.4 C)   Resp 16   Ht 5' 4.5" (1.638 m)   Wt 143 lb 3.2 oz (65 kg)   BMI 24.20 kg/m   Wound of upper anterior chest well healed w/o signs of infection. Sutures x 5 removed w/o difficulty and Neosporin& band-aid applied.     Assessment & Plan:   1. Skin cancer of anterior chest

## 2019-04-15 ENCOUNTER — Ambulatory Visit: Payer: Medicare Other | Admitting: Internal Medicine

## 2019-04-15 ENCOUNTER — Other Ambulatory Visit: Payer: Self-pay

## 2019-04-15 VITALS — BP 130/80 | HR 72 | Temp 97.5°F | Resp 16 | Ht 64.5 in | Wt 143.2 lb

## 2019-04-15 DIAGNOSIS — C44509 Unspecified malignant neoplasm of skin of other part of trunk: Secondary | ICD-10-CM

## 2019-04-17 ENCOUNTER — Encounter: Payer: Self-pay | Admitting: Gastroenterology

## 2019-05-21 ENCOUNTER — Ambulatory Visit: Payer: Medicare Other | Admitting: Internal Medicine

## 2019-05-21 DIAGNOSIS — F0150 Vascular dementia without behavioral disturbance: Secondary | ICD-10-CM | POA: Insufficient documentation

## 2019-05-21 NOTE — Progress Notes (Signed)
     R  E  S  C  H  E  D  U  L  E  D                                                                                                                                                                                                                                                This very nice 68 y.o. MWF presents for 3 month follow up with HTN, HLD, Pre-Diabetes and Vitamin D Deficiency.       Patient is treated for HTN (2000) & BP has been controlled at home. Today's  . Patient has had no complaints of any cardiac type chest pain, palpitations, dyspnea / orthopnea / PND, dizziness, claudication, or dependent edema.      Hyperlipidemia is controlled with diet & meds. Patient denies myalgias or other med SE's. Last Lipids were at goal:  Lab Results  Component Value Date   CHOL 139 10/29/2018   HDL 54 10/29/2018   LDLCALC 71 10/29/2018   TRIG 65 10/29/2018   CHOLHDL 2.6 10/29/2018        Also, the patient has history of T2_NIDDM (2000)  And she was tyreated with Metformin til 2018 when her kidney functions worsened. As she hydrated betterand lost weight she has been able to stay off of Diabetic meds.  She  has had no symptoms of reactive hypoglycemia, diabetic polys, paresthesias or visual blurring.  Last A1c was not at goal: Lab Results  Component Value Date   HGBA1C 6.1 (H) 10/29/2018        Further, the patient also has history of Vitamin D Deficiency and supplements vitamin D without any suspected side-effects. Last vitamin D was still low :  Lab Results  Component Value Date   VD25OH 38 10/29/2018

## 2019-06-25 ENCOUNTER — Other Ambulatory Visit: Payer: Self-pay

## 2019-06-25 ENCOUNTER — Encounter: Payer: Self-pay | Admitting: Adult Health Nurse Practitioner

## 2019-06-25 ENCOUNTER — Ambulatory Visit: Payer: Medicare Other | Admitting: Adult Health Nurse Practitioner

## 2019-06-25 VITALS — BP 118/68 | HR 100 | Temp 97.7°F | Wt 153.2 lb

## 2019-06-25 DIAGNOSIS — L2089 Other atopic dermatitis: Secondary | ICD-10-CM | POA: Diagnosis not present

## 2019-06-25 DIAGNOSIS — L409 Psoriasis, unspecified: Secondary | ICD-10-CM

## 2019-06-25 DIAGNOSIS — L309 Dermatitis, unspecified: Secondary | ICD-10-CM

## 2019-06-25 MED ORDER — PREDNISONE 10 MG (21) PO TBPK: ORAL_TABLET | Freq: Every day | ORAL | 0 refills | Status: DC

## 2019-06-25 MED ORDER — CLOBETASOL PROPIONATE 0.05 % EX OINT: 1.0000 "application " | TOPICAL_OINTMENT | Freq: Two times a day (BID) | CUTANEOUS | 0 refills | Status: DC

## 2019-06-25 NOTE — Progress Notes (Signed)
Assessment and Plan:  Marie [name] was seen today for acute visit.  Diagnoses and all orders for this visit:  Eczema, unspecified type Discussed selsun blue for hair Use non scented lotion for moisture  Other atopic dermatitis -     predniSONE (STERAPRED UNI-PAK 21 TAB) 10 MG (21) TBPK tablet; Take by mouth daily. Follow directions on Taper pack Discussed medication and side effects.  Take with food and increase water intake.  Psoriasis -     clobetasol ointment (TEMOVATE) 0.05 %; Apply 1 application topically 2 (two) times daily to bilateral lower extremities. Continue to monitor  Defer any labs today  Further disposition pending results of labs. Discussed med's effects and SE's.   Over 30 minutes of  face to face interview exam, counseling, chart review, and critical decision making was performed.   Future Appointments  Date Time Provider [place]  07/16/2019  3:45 PM Garnet Sierras, NP [facility] None  10/14/2019 11:00 AM [name], MD [facility] None  01/21/2020  2:00 PM Liane Comber, NP [facility] None    ------------------------------------------------------------------------------------------------------------------   HPI 68 y.o.female presents for evaluation of white patches and rash bilateral lower legs, arms, neck and lower back.  Reports this has been ongoing and they have been using triamcinolone topical and wrapping with saran wrap.  Reports this has been making it worse so they backed off to every other day.  She also has single raised lesions to bilateral fore arms.  Right side of neck to hairline and her lumbar back, bilaterally.  She is accompanied by her husband today.  He reports she if very forgetful and has dementia.  She denies any itching although there area areas of excoriation and her husband reports she is always scratching.  She has tried hot baths to help but did not provide any relief.    Past Medical History:  Diagnosis Date  . Dementia  ([place])   . Depression   . Diabetes mellitus   . Hypertension   . Thyroid disease   . Type II or unspecified type diabetes mellitus without mention of complication, not stated as uncontrolled    stopped metformin due to diarrhea, no meds now  . Vitamin D deficiency      Allergies  Allergen Reactions  . Penicillins Rash    Swelling in face - last 10 years    Current Outpatient Medications on File Prior to Visit  Medication Sig  . Blood Glucose Monitoring Suppl (ONETOUCH VERIO) w/Device KIT 1 kit by Does not apply route as needed. Use to check glucose QD DX-E11.22  . cetirizine (ZYRTEC) 10 MG tablet Take 10 mg by mouth at bedtime.  . cholecalciferol (VITAMIN D) 1000 units tablet Take 10,000 Units by mouth daily.  . diphenhydrAMINE (BENADRYL) 25 MG tablet Take by mouth as needed.   . DULoxetine (CYMBALTA) 60 MG capsule Take 1 capsule (60 mg total) by mouth daily.  Marland Kitchen glucose blood test strip CHECK BLOOD SUGAR 1 TIME DAILY-DX e11.22  . memantine (NAMENDA XR) 28 MG CP24 24 hr capsule Take 28 mg by mouth.  . metoCLOPramide (REGLAN) 10 MG tablet Take 1 tablet (10 mg total) by mouth every 6 (six) hours as needed for nausea (nausea/headache).  . Multiple Vitamin (MULTIVITAMIN) tablet Take 1 tablet by mouth daily.  . ondansetron (ZOFRAN) 8 MG tablet Take 1/2 to 1 tablet 2 to 3 x / day if needed for nausea / vomiting (Patient taking differently: as needed. Take 1/2 to 1 tablet 2 to 3 x /  day if needed for nausea / vomiting)  . ONE TOUCH LANCETS MISC Use as directed to check glucose daily. DX-E11.22  . rosuvastatin (CRESTOR) 20 MG tablet Take one tablet daily for cholesterol  . tamoxifen (NOLVADEX) 20 MG tablet Take 20 mg by mouth daily.  . traZODone (DESYREL) 50 MG tablet as needed.   . triamcinolone ointment (KENALOG) 0.1 % Apply 1 application topically 2 (two) times daily. Apply sparingly to Rash of legs 2 x /day & cover with Saran Wrap   No current facility-administered medications on file  prior to visit.    ROS: all negative except above.   Physical Exam:  BP 118/68   Pulse 100   Temp 97.7 F (36.5 C)   Wt 153 lb 3.2 oz (69.5 kg)   SpO2 98%   BMI 25.89 kg/m   General Appearance: Well nourished, in no apparent distress. Eyes: PERRLA, EOMs, conjunctiva no swelling or erythema Sinuses: No Frontal/maxillary tenderness ENT/Mouth: Ext aud canals clear, TMs without erythema, bulging. Hearing normal. Wearing mask. Neck: Supple, thyroid normal.  Respiratory: Respiratory effort normal, BS equal bilaterally without rales, rhonchi, wheezing or stridor.  Cardio: RRR with no MRGs. Brisk peripheral pulses without edema. Musculoskeletal: Full ROM, 5/5 strength, normal gait.  Skin: Warm, dry.  Bilateral mid shin white patches/pearly with distinct boarders, raised.  Excoriation noted.  Bilateral arms and upper chest single red raised papules localized.  Neuro: Cranial nerves intact. Normal muscle tone, no cerebellar symptoms. Sensation intact.  Psych: Awake and oriented X 2, normal affect, some insight and judgment impairment.    Garnet Sierras, NP 15:10 PM [facility]

## 2019-06-25 NOTE — Patient Instructions (Addendum)
   We are going to send in a different cream for you to use.  Clobesatsol  Apply this twice a day to the affected areas.  Start taking Zyrtec (Cetirazine) at night.  This will help with the itching.  Increase your water intake.  Try to drink 8 glasses of water a day.  That is 64-80oz.  We will also send in a prednisone for you to take.  Start taking this tomorrow.  Take the medication with a meal.  Get some thick moisturizing cream like Eucerine or vaseline in between applications of clobetasol.  Avoid hot showers or baths.  This will dry out skin more. You can apply cold wash cloth to the area to help cool it down Avoid itching to avoid scratching opwn skin and causing secondary infection.   If this does not improve please let us know.

## 2019-07-09 ENCOUNTER — Ambulatory Visit: Payer: Medicare Other | Admitting: Adult Health Nurse Practitioner

## 2019-07-16 ENCOUNTER — Ambulatory Visit: Payer: Medicare Other | Admitting: Adult Health Nurse Practitioner

## 2019-08-19 ENCOUNTER — Encounter: Payer: Self-pay | Admitting: Internal Medicine

## 2019-09-25 ENCOUNTER — Telehealth: Payer: Self-pay | Admitting: Physician Assistant

## 2019-09-25 DIAGNOSIS — R21 Rash and other nonspecific skin eruption: Secondary | ICD-10-CM

## 2019-09-25 NOTE — Telephone Encounter (Signed)
Patient was seen in Dec for chronic rash, daughter states rash is getting worse, no approval. Since rash has not improved with conservative measure will refer to Derm for evaluation.   Please go to the ER if any fever, chills, severe pain in her leg, SOB, CP.

## 2019-10-14 ENCOUNTER — Ambulatory Visit: Payer: Self-pay | Admitting: Internal Medicine

## 2019-10-14 ENCOUNTER — Encounter: Payer: Self-pay | Admitting: Internal Medicine

## 2019-10-14 NOTE — Progress Notes (Signed)
       C  A  N  C  E  L  L  E  D    D  A Y      Of        A  P  P  T                                                                                                                                                                                                    This very nice 70 y.o. MWF  presents for a Screening /Preventative Visit & comprehensive evaluation and management of multiple medical co-morbidities.  Patient has been followed for HTN, HLD, T2_NIDDM  Prediabetes  and Vitamin D Deficiency.    Patient is on SS Disability for Depression & Dementia since 2015 and is followed by Dr Toy Care. She was evaluated by Dr Leta Baptist in 2012 for her Dementia &  requires moderate supervision by her caretaker husband.       HTN predates circa 2000. Patient's BP has been controlled at home and patient denies any cardiac symptoms as chest pain, palpitations, shortness of breath, dizziness or ankle swelling. Today's        Patient's hyperlipidemia is controlled with diet and medications. Patient denies myalgias or other medication SE's. 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 With weight loss her A1c's have improved, but are still not at goal:  Lab Results  Component Value Date   HGBA1C 6.1 (H) 10/29/2018      Patient was dx'd Hypothyroid in 2010 and has been on replacement therapy since.      Finally, patient has history of Vitamin D Deficiency("29" / 2008)  and last Vitamin D was  Still low:  Lab Results   Component Value Date   VD25OH 38 10/29/2018

## 2019-10-14 NOTE — Patient Instructions (Signed)

## 2019-10-29 DIAGNOSIS — Z85828 Personal history of other malignant neoplasm of skin: Secondary | ICD-10-CM | POA: Diagnosis not present

## 2019-10-29 DIAGNOSIS — D485 Neoplasm of uncertain behavior of skin: Secondary | ICD-10-CM | POA: Diagnosis not present

## 2019-10-29 DIAGNOSIS — L821 Other seborrheic keratosis: Secondary | ICD-10-CM | POA: Diagnosis not present

## 2019-10-29 DIAGNOSIS — L280 Lichen simplex chronicus: Secondary | ICD-10-CM | POA: Diagnosis not present

## 2019-10-29 DIAGNOSIS — L905 Scar conditions and fibrosis of skin: Secondary | ICD-10-CM | POA: Diagnosis not present

## 2019-11-04 ENCOUNTER — Emergency Department (HOSPITAL_COMMUNITY): Payer: Medicare PPO

## 2019-11-04 ENCOUNTER — Encounter (HOSPITAL_COMMUNITY): Payer: Self-pay | Admitting: Emergency Medicine

## 2019-11-04 ENCOUNTER — Observation Stay (HOSPITAL_COMMUNITY)
Admission: EM | Admit: 2019-11-04 | Discharge: 2019-11-06 | Disposition: A | Discharge status: Home / Self Care | Payer: Medicare PPO | Attending: Cardiology | Admitting: Cardiology

## 2019-11-04 DIAGNOSIS — Z880 Allergy status to penicillin: Secondary | ICD-10-CM | POA: Insufficient documentation

## 2019-11-04 DIAGNOSIS — N281 Cyst of kidney, acquired: Secondary | ICD-10-CM | POA: Diagnosis not present

## 2019-11-04 DIAGNOSIS — E1169 Type 2 diabetes mellitus with other specified complication: Secondary | ICD-10-CM | POA: Diagnosis not present

## 2019-11-04 DIAGNOSIS — Z96652 Presence of left artificial knee joint: Secondary | ICD-10-CM | POA: Diagnosis not present

## 2019-11-04 DIAGNOSIS — I700 Atherosclerosis of aorta: Secondary | ICD-10-CM | POA: Insufficient documentation

## 2019-11-04 DIAGNOSIS — N1831 Chronic kidney disease, stage 3a: Secondary | ICD-10-CM | POA: Insufficient documentation

## 2019-11-04 DIAGNOSIS — F0150 Vascular dementia without behavioral disturbance: Secondary | ICD-10-CM | POA: Insufficient documentation

## 2019-11-04 DIAGNOSIS — I2510 Atherosclerotic heart disease of native coronary artery without angina pectoris: Secondary | ICD-10-CM | POA: Diagnosis not present

## 2019-11-04 DIAGNOSIS — Z20822 Contact with and (suspected) exposure to covid-19: Secondary | ICD-10-CM | POA: Diagnosis not present

## 2019-11-04 DIAGNOSIS — F329 Major depressive disorder, single episode, unspecified: Secondary | ICD-10-CM | POA: Diagnosis not present

## 2019-11-04 DIAGNOSIS — E1122 Type 2 diabetes mellitus with diabetic chronic kidney disease: Secondary | ICD-10-CM | POA: Insufficient documentation

## 2019-11-04 DIAGNOSIS — E782 Mixed hyperlipidemia: Secondary | ICD-10-CM | POA: Insufficient documentation

## 2019-11-04 DIAGNOSIS — M438X4 Other specified deforming dorsopathies, thoracic region: Secondary | ICD-10-CM | POA: Diagnosis not present

## 2019-11-04 DIAGNOSIS — R778 Other specified abnormalities of plasma proteins: Secondary | ICD-10-CM | POA: Diagnosis not present

## 2019-11-04 DIAGNOSIS — R0602 Shortness of breath: Secondary | ICD-10-CM | POA: Diagnosis not present

## 2019-11-04 DIAGNOSIS — E785 Hyperlipidemia, unspecified: Secondary | ICD-10-CM | POA: Diagnosis present

## 2019-11-04 DIAGNOSIS — I214 Non-ST elevation (NSTEMI) myocardial infarction: Secondary | ICD-10-CM | POA: Diagnosis not present

## 2019-11-04 DIAGNOSIS — Z7952 Long term (current) use of systemic steroids: Secondary | ICD-10-CM | POA: Insufficient documentation

## 2019-11-04 DIAGNOSIS — I129 Hypertensive chronic kidney disease with stage 1 through stage 4 chronic kidney disease, or unspecified chronic kidney disease: Secondary | ICD-10-CM | POA: Diagnosis not present

## 2019-11-04 DIAGNOSIS — I252 Old myocardial infarction: Secondary | ICD-10-CM | POA: Diagnosis present

## 2019-11-04 DIAGNOSIS — Z79899 Other long term (current) drug therapy: Secondary | ICD-10-CM | POA: Insufficient documentation

## 2019-11-04 DIAGNOSIS — I10 Essential (primary) hypertension: Secondary | ICD-10-CM

## 2019-11-04 DIAGNOSIS — R0789 Other chest pain: Secondary | ICD-10-CM | POA: Diagnosis not present

## 2019-11-04 DIAGNOSIS — E079 Disorder of thyroid, unspecified: Secondary | ICD-10-CM | POA: Insufficient documentation

## 2019-11-04 DIAGNOSIS — R079 Chest pain, unspecified: Secondary | ICD-10-CM | POA: Diagnosis not present

## 2019-11-04 LAB — CBC
HCT: 38.9 % (ref 36.0–46.0)
Hemoglobin: 12.9 g/dL (ref 12.0–15.0)
MCH: 31 pg (ref 26.0–34.0)
MCHC: 33.2 g/dL (ref 30.0–36.0)
MCV: 93.5 fL (ref 80.0–100.0)
Platelets: 233 10*3/uL (ref 150–400)
RBC: 4.16 MIL/uL (ref 3.87–5.11)
RDW: 12.3 % (ref 11.5–15.5)
WBC: 13.1 10*3/uL — ABNORMAL HIGH (ref 4.0–10.5)
nRBC: 0 % (ref 0.0–0.2)

## 2019-11-04 LAB — BASIC METABOLIC PANEL
Anion gap: 12 (ref 5–15)
BUN: 17 mg/dL (ref 8–23)
CO2: 25 mmol/L (ref 22–32)
Calcium: 8.9 mg/dL (ref 8.9–10.3)
Chloride: 105 mmol/L (ref 98–111)
Creatinine, Ser: 1.14 mg/dL — ABNORMAL HIGH (ref 0.44–1.00)
GFR calc Af Amer: 58 mL/min — ABNORMAL LOW (ref >60)
GFR calc non Af Amer: 50 mL/min — ABNORMAL LOW (ref >60)
Glucose, Bld: 115 mg/dL — ABNORMAL HIGH (ref 70–99)
Potassium: 3.8 mmol/L (ref 3.5–5.1)
Sodium: 142 mmol/L (ref 135–145)

## 2019-11-04 LAB — TROPONIN I (HIGH SENSITIVITY)
Troponin I (High Sensitivity): 157 ng/L (ref <18)
Troponin I (High Sensitivity): 455 ng/L (ref <18)

## 2019-11-04 LAB — SARS CORONAVIRUS 2 BY RT PCR (HOSPITAL ORDER, PERFORMED IN ~~LOC~~ HOSPITAL LAB): SARS Coronavirus 2: NEGATIVE

## 2019-11-04 MED ORDER — SODIUM CHLORIDE 0.9 % WEIGHT BASED INFUSION
3.0000 mL/kg/h | INTRAVENOUS | Status: DC
  Administered 2019-11-05: 04:00:00 3 mL/kg/h via INTRAVENOUS

## 2019-11-04 MED ORDER — SODIUM CHLORIDE 0.9% FLUSH: 3.0000 mL | INTRAVENOUS | Status: DC | PRN

## 2019-11-04 MED ORDER — ASPIRIN 81 MG PO CHEW: 324.0000 mg | CHEWABLE_TABLET | ORAL | Status: AC

## 2019-11-04 MED ORDER — ASPIRIN 300 MG RE SUPP: 300.0000 mg | RECTAL | Status: AC

## 2019-11-04 MED ORDER — HEPARIN (PORCINE) 25000 UT/250ML-% IV SOLN
1000.0000 [IU]/h | INTRAVENOUS | Status: DC
  Administered 2019-11-04: 20:00:00 850 [IU]/h via INTRAVENOUS
  Filled 2019-11-04: qty 250

## 2019-11-04 MED ORDER — ROSUVASTATIN CALCIUM 20 MG PO TABS
20.0000 mg | ORAL_TABLET | Freq: Every day | ORAL | Status: DC
  Administered 2019-11-04 – 2019-11-06 (×3): 20 mg via ORAL
  Filled 2019-11-04: qty 4
  Filled 2019-11-04 (×2): qty 1

## 2019-11-04 MED ORDER — SODIUM CHLORIDE 0.9% FLUSH
3.0000 mL | Freq: Once | INTRAVENOUS | Status: AC
  Administered 2019-11-04: 20:00:00 3 mL via INTRAVENOUS

## 2019-11-04 MED ORDER — TAMOXIFEN CITRATE 10 MG PO TABS
20.0000 mg | ORAL_TABLET | Freq: Every day | ORAL | Status: DC
  Administered 2019-11-04 – 2019-11-06 (×3): 20 mg via ORAL
  Filled 2019-11-04 (×3): qty 2

## 2019-11-04 MED ORDER — ONDANSETRON HCL 4 MG/2ML IJ SOLN: 4.0000 mg | Freq: Four times a day (QID) | INTRAMUSCULAR | Status: DC | PRN

## 2019-11-04 MED ORDER — SODIUM CHLORIDE 0.9 % IV SOLN: 250.0000 mL | INTRAVENOUS | Status: DC | PRN

## 2019-11-04 MED ORDER — NITROGLYCERIN 0.4 MG SL SUBL: 0.4000 mg | SUBLINGUAL_TABLET | SUBLINGUAL | Status: DC | PRN

## 2019-11-04 MED ORDER — ASPIRIN 81 MG PO CHEW
324.0000 mg | CHEWABLE_TABLET | Freq: Once | ORAL | Status: AC
  Administered 2019-11-04: 18:00:00 324 mg via ORAL
  Filled 2019-11-04: qty 4

## 2019-11-04 MED ORDER — ASPIRIN 81 MG PO CHEW
81.0000 mg | CHEWABLE_TABLET | ORAL | Status: AC
  Administered 2019-11-05: 06:00:00 81 mg via ORAL
  Filled 2019-11-04: qty 1

## 2019-11-04 MED ORDER — DULOXETINE HCL 60 MG PO CPEP
60.0000 mg | ORAL_CAPSULE | Freq: Every day | ORAL | Status: DC
  Administered 2019-11-04 – 2019-11-06 (×3): 60 mg via ORAL
  Filled 2019-11-04 (×3): qty 1

## 2019-11-04 MED ORDER — MEMANTINE HCL ER 28 MG PO CP24
28.0000 mg | ORAL_CAPSULE | Freq: Every day | ORAL | Status: DC
  Administered 2019-11-04 – 2019-11-06 (×3): 28 mg via ORAL
  Filled 2019-11-04 (×4): qty 1

## 2019-11-04 MED ORDER — ASPIRIN EC 81 MG PO TBEC
81.0000 mg | DELAYED_RELEASE_TABLET | Freq: Every day | ORAL | Status: DC
  Administered 2019-11-05 – 2019-11-06 (×2): 81 mg via ORAL
  Filled 2019-11-04 (×2): qty 1

## 2019-11-04 MED ORDER — IOHEXOL 350 MG/ML SOLN
75.0000 mL | Freq: Once | INTRAVENOUS | Status: AC | PRN
  Administered 2019-11-04: 19:00:00 59 mL via INTRAVENOUS

## 2019-11-04 MED ORDER — ACETAMINOPHEN 325 MG PO TABS: 650.0000 mg | ORAL_TABLET | ORAL | Status: DC | PRN

## 2019-11-04 MED ORDER — HEPARIN BOLUS VIA INFUSION
4000.0000 [IU] | Freq: Once | INTRAVENOUS | Status: AC
  Administered 2019-11-04: 20:00:00 4000 [IU] via INTRAVENOUS
  Filled 2019-11-04: qty 4000

## 2019-11-04 MED ORDER — SODIUM CHLORIDE 0.9% FLUSH
3.0000 mL | Freq: Two times a day (BID) | INTRAVENOUS | Status: DC
  Administered 2019-11-04 – 2019-11-05 (×2): 3 mL via INTRAVENOUS

## 2019-11-04 MED ORDER — SODIUM CHLORIDE 0.9 % WEIGHT BASED INFUSION
1.0000 mL/kg/h | INTRAVENOUS | Status: DC
  Administered 2019-11-05: 05:00:00 1 mL/kg/h via INTRAVENOUS

## 2019-11-04 NOTE — ED Notes (Addendum)
Note error

## 2019-11-04 NOTE — ED Provider Notes (Signed)
I saw and evaluated the patient, reviewed the resident's note and I agree with the findings and plan.  Pertinent History: This very pleasant 69 year old female with history of breast cancer presents with complaints of breast pain and some shortness of breath.  According to the medical history the patient has not had any prior cardiac evaluations, she does take hypercholesterol medications on a statin, she takes tamoxifen but takes no medications for diabetes or hypertension.  Complains of nothing at this time  Pertinent Exam findings: Currently the heart rate is about 85, the blood pressure is 123/79, respirations of 18 and oxygen of 98% on room air.  She appears comfortable, has a soft abdomen and no peripheral edema or JVD.  This patient has an elevated troponin, second troponin went up to 455 consistent with some type of cardiac ischemia.  We will need to rule out pulmonary embolism though this could be primary cardiac ischemia as well.  The patient is agreeable to the plan  I was personally present and directly supervised the following procedures:  Medical evaluation for chest pain shortness of breath EKG interpretation, chest x-ray, lab interpretation  This patient has an elevated troponin which seems to be rising this could be consistent with a non-ST elevation MI however with the patient's history of breast cancer and her shortness of breath she will need to have a CT angiogram to further investigate the cause of this elevation in troponin as it could very well be pulmonary embolism.   EKG Interpretation  Date/Time:  Monday Nov 04 2019 13:48:25 EDT Ventricular Rate:  112 PR Interval:  130 QRS Duration: 74 QT Interval:  322 QTC Calculation: 439 R Axis:   69 Text Interpretation: Sinus tachycardia Otherwise normal ECG Since last tracing rate faster Confirmed by Noemi Chapel [phone]) on 11/04/2019 6:04:05 PM      .Critical Care Performed by: Noemi Chapel, MD Authorized by: Noemi Chapel, MD   Critical care provider statement:    Critical care time (minutes):  35   Critical care time was exclusive of:  Separately billable procedures and treating other patients and teaching time   Critical care was necessary to treat or prevent imminent or life-threatening deterioration of the following conditions:  Cardiac failure   Critical care was time spent personally by me on the following activities:  Blood draw for specimens, development of treatment plan with patient or surrogate, discussions with consultants, evaluation of patient's response to treatment, examination of patient, obtaining history from patient or surrogate, ordering and performing treatments and interventions, ordering and review of laboratory studies, ordering and review of radiographic studies, pulse oximetry, re-evaluation of patient's condition and review of old charts     I personally interpreted the EKG as well as the resident and agree with the interpretation on the resident's chart.  Final diagnoses:  NSTEMI (non-ST elevated myocardial infarction) ([place])      Noemi Chapel, MD 11/06/19 [phone]

## 2019-11-04 NOTE — ED Notes (Signed)
Pt ambulates well to bathroom, independently

## 2019-11-04 NOTE — ED Notes (Signed)
Food given per MD

## 2019-11-04 NOTE — H&P (Addendum)
Cardiology Admission History and Physical:   Patient ID: Marie [name] MRN: [id number]; DOB: August 09, 1950   Admission date: 11/04/2019  Primary Care Provider: [name], MD Primary Cardiologist: [facility] to [facility]   Chief Complaint:  Chest pain   Patient Profile:   Marie [name] is a 69 y.o. female with history of right breast CA, diet-controlled DM 2, and dementia who presented to the ED on 11/04/2019 with left lower chest/breast pain and shortness of breath found to have an elevated troponin.  History of Present Illness:   Marie [name] is a 69 year old female with a history stated above who presented to [facility] on 11/04/2019 with complaints of left chest/breast pain and shortness of breath.  HPI obtained from husband who is at bedside secondary to dementia.  Husband reports that around 108 yesterday morning wife reported left side/chest pain reported as a nagging, constant feeling.  He states that she initially thought she slept on her side wrong and therefore went on about her day.  Later on in the afternoon, she continued to have complaints.  Husband then reports that last night about 11 PM woke up gasping for air with similar complaints.  He asked her to turn onto her right side and she eventually went back to sleep.  Symptoms were still present upon waking today therefore they came to the ED for further evaluation.  He reports that she has no history of CAD.  No history of tobacco or alcohol use.  Has baseline dementia and is followed by her PCP for care.  Husband is her primary caregiver at this time.  She currently denies chest pain, palpitations, shortness of breath, LE edema, palpitations or syncope.  In the ED, HST found to be elevated at 157 with a repeat of 455. CTA with atherosclerotic calcifications of the thoracic aorta without aneurysmal dilatation along with coronary calcifications and no pulmonary emboli are identified.  Creatinine 1.14. CXR with no acute cardiopulmonary disease.   EKG with sinus tachycardia with HR 112bpm and no acute ischemic changes.  Past Medical History:  Diagnosis Date   Dementia ([place])    Depression    Diabetes mellitus    Hypertension    Thyroid disease    Type II or unspecified type diabetes mellitus without mention of complication, not stated as uncontrolled    stopped metformin due to diarrhea, no meds now   Vitamin D deficiency     Past Surgical History:  Procedure Laterality Date   BREAST LUMPECTOMY WITH RADIOACTIVE SEED AND SENTINEL LYMPH NODE BIOPSY Right 03/08/2018   Procedure: BREAST LUMPECTOMY WITH RADIOACTIVE SEED AND SENTINEL LYMPH NODE BIOPSY;  Surgeon: [name], MD;  Location: [facility];  Service: General;  Laterality: Right;   FRACTURE SURGERY  2009   right arm; rod   TOTAL KNEE ARTHROPLASTY  june 2013   left     Medications Prior to Admission: Prior to Admission medications   Medication Sig Start Date End Date Taking? Authorizing Provider  Blood Glucose Monitoring Suppl (ONETOUCH VERIO) w/Device KIT 1 kit by Does not apply route as needed. Use to check glucose QD DX-E11.22 05/25/17   [name], MD  cetirizine (ZYRTEC) 10 MG tablet Take 10 mg by mouth at bedtime.    [provider]  cholecalciferol (VITAMIN D) 1000 units tablet Take 10,000 Units by mouth daily.    [provider]  clobetasol ointment (TEMOVATE) 9.56 % Apply 1 application topically 2 (two) times daily. 06/25/19   Garnet Sierras,  NP  diphenhydrAMINE (BENADRYL) 25 MG tablet Take by mouth as needed.     [provider]  DULoxetine (CYMBALTA) 60 MG capsule Take 1 capsule (60 mg total) by mouth daily. 01/09/18   [name], MD  glucose blood test strip CHECK BLOOD SUGAR 1 TIME DAILY-DX e11.22 06/13/17   Liane Comber, NP  memantine (NAMENDA XR) 28 MG CP24 24 hr capsule Take 28 mg by mouth.    [provider]  metoCLOPramide (REGLAN) 10 MG tablet Take 1 tablet (10 mg total) by  mouth every 6 (six) hours as needed for nausea (nausea/headache). 01/20/19   Drenda Freeze, MD  Multiple Vitamin (MULTIVITAMIN) tablet Take 1 tablet by mouth daily.    [provider]  ondansetron (ZOFRAN) 8 MG tablet Take 1/2 to 1 tablet 2 to 3 x / day if needed for nausea / vomiting Patient taking differently: as needed. Take 1/2 to 1 tablet 2 to 3 x / day if needed for nausea / vomiting 06/06/17   [name], MD  ONE [facility] LANCETS MISC Use as directed to check glucose daily. GP-Q98.26 06/13/17   Liane Comber, NP  predniSONE (STERAPRED UNI-PAK 21 TAB) 10 MG (21) TBPK tablet Take by mouth daily. Follow directions on Taper pack 06/25/19   Garnet Sierras, NP  rosuvastatin (CRESTOR) 20 MG tablet Take one tablet daily for cholesterol 07/30/18   [name], MD  tamoxifen (NOLVADEX) 20 MG tablet Take 20 mg by mouth daily.    [provider]  traZODone (DESYREL) 50 MG tablet as needed.  03/05/18   [provider]  triamcinolone ointment (KENALOG) 0.1 % Apply 1 application topically 2 (two) times daily. Apply sparingly to Rash of legs 2 x /day & cover with [place] Mule 12/31/18   [name], MD     Allergies:    Allergies  Allergen Reactions   Penicillins Rash    Swelling in face - last 10 years    Social History:   Social History   Socioeconomic History   Marital status: Married    Spouse name: Ronalee Belts   Number of children: 2   Years of education: Not on file   Highest education level: Not on file  Occupational History   Not on file  Tobacco Use   Smoking status: Never Smoker   Smokeless tobacco: Never Used  Substance and Sexual Activity   Alcohol use: No   Drug use: No   Sexual activity: Not on file  Other Topics Concern   Not on file  Social History Narrative   Not on file   Social Determinants of Health   Financial Resource Strain:    Difficulty of Paying Living Expenses:   Food Insecurity:    Worried About Paediatric nurse in the Last Year:    Arboriculturist in the Last Year:   Transportation Needs:    Film/video editor (Medical):    Lack of Transportation (Non-Medical):   Physical Activity:    Days of Exercise per Week:    Minutes of Exercise per Session:   Stress:    Feeling of Stress :   Social Connections:    Frequency of Communication with Friends and Family:    Frequency of Social Gatherings with Friends and Family:    Attends Religious Services:    Active Member of Clubs or Organizations:    Attends Archivist Meetings:    Marital Status:   Intimate Partner Violence:  Fear of Current or Ex-Partner:    Emotionally Abused:    Physically Abused:    Sexually Abused:     Family History:  The patient's family history includes Breast cancer in her mother; Dementia in her father; Emphysema in her mother; Kidney failure in her father. There is no history of Colon cancer, Stomach cancer, Ovarian cancer, or Prostate cancer.    ROS:  Please see the history of present illness.  All other ROS reviewed and negative.     Physical Exam/Data:   Vitals:   11/04/19 1338  BP: 123/79  Pulse: (!) 116  Resp: 18  Temp: 98.2 F (36.8 C)  TempSrc: Oral  SpO2: 100%   No intake or output data in the 24 hours ending 11/04/19 2003 Last 3 Weights 06/25/2019 04/15/2019 04/02/2019  Weight (lbs) 153 lb 3.2 oz 143 lb 3.2 oz 148 lb 6.4 oz  Weight (kg) 69.491 kg 64.955 kg 67.314 kg     There is no height or weight on file to calculate BMI.   General: Well developed, well nourished, NAD Neck: Negative for carotid bruits. No JVD Lungs:Clear to ausculation bilaterally. No wheezes, rales, or rhonchi. Breathing is unlabored. Cardiovascular: RRR with S1 S2. No murmurs Abdomen: Soft, non-tender, non-distended. No obvious abdominal masses. Extremities: No edema.  Radial pulses 2+ bilaterally Neuro: Alert and oriented. No focal deficits. No facial asymmetry. MAE  spontaneously. Psych: Responds to questions appropriately with normal affect.     EKG:  The ECG that was done 11/04/2019 was personally reviewed and demonstrates sinus tachycardia, HR 112  Relevant CV Studies:  None  Laboratory Data:  High Sensitivity Troponin:   Recent Labs  Lab 11/04/19 1359 11/04/19 1640  TROPONINIHS 157* 455*      Chemistry Recent Labs  Lab 11/04/19 1359  NA 142  K 3.8  CL 105  CO2 25  GLUCOSE 115*  BUN 17  CREATININE 1.14*  CALCIUM 8.9  GFRNONAA 50*  GFRAA 58*  ANIONGAP 12    No results for input(s): PROT, ALBUMIN, AST, ALT, ALKPHOS, BILITOT in the last 168 hours. Hematology Recent Labs  Lab 11/04/19 1359  WBC 13.1*  RBC 4.16  HGB 12.9  HCT 38.9  MCV 93.5  MCH 31.0  MCHC 33.2  RDW 12.3  PLT 233   BNPNo results for input(s): BNP, PROBNP in the last 168 hours.  DDimer No results for input(s): DDIMER in the last 168 hours.   Radiology/Studies:  DG Chest 2 View  Result Date: 11/04/2019 CLINICAL DATA:  Altered mental status.  Left chest pain. EXAM: CHEST - 2 VIEW COMPARISON:  Single-view of the chest 01/20/2019. FINDINGS: The lungs are clear. Heart size is normal. No pneumothorax or pleural effusion. Two mild compression fracture deformities of the thoracic spine at approximately T6 and T7 cannot be definitively characterized but appear remote. Intramedullary nail in the proximal right humerus is noted. Surgical clips right axilla are seen. IMPRESSION: No acute disease. Electronically Signed   By: Inge Rise M.D.   On: 11/04/2019 14:28   CT Angio Chest PE W and/or Wo Contrast  Result Date: 11/04/2019 CLINICAL DATA:  Shortness of breath EXAM: CT ANGIOGRAPHY CHEST WITH CONTRAST TECHNIQUE: Multidetector CT imaging of the chest was performed using the standard protocol during bolus administration of intravenous contrast. Multiplanar CT image reconstructions and MIPs were obtained to evaluate the vascular anatomy. CONTRAST:  66m OMNIPAQUE  IOHEXOL 350 MG/ML SOLN COMPARISON:  Chest x-ray from earlier in the same day. FINDINGS: Cardiovascular: Atherosclerotic  calcifications of the thoracic aorta are noted without aneurysmal dilatation. Dissection cannot be evaluated due to timing of contrast bolus. No cardiac enlargement is seen. Coronary calcifications are noted. The pulmonary artery shows a normal branching pattern. No pulmonary emboli are identified. Mediastinum/Nodes: Thoracic inlet is within normal limits. No sizable hilar or mediastinal adenopathy is noted. The esophagus as visualized is within normal limits. Lungs/Pleura: Lungs are well aerated bilaterally. No focal infiltrate or sizable effusion is seen. Some minimal subpleural scarring is noted in the anterior aspect of the right upper and right likely related to prior radiation therapy. Upper Abdomen: Upper pole right renal cyst is noted measuring approximately 3.6 cm. No other focal abnormality is noted in the upper abdomen. Musculoskeletal: Postsurgical changes are noted in the right breast consistent with the given clinical history. Degenerative changes of the thoracic spine are seen. T6 and T7 compression deformities are noted similar to that seen on the prior plain film examination. No rib abnormality is noted. Review of the MIP images confirms the above findings. IMPRESSION: No evidence of pulmonary emboli. Postsurgical and post radiation changes in the right breast and right lung as described. Right renal cyst. Chronic appearing compression deformities in the thoracic spine. Aortic Atherosclerosis (ICD10-I70.0). Electronically Signed   By: Inez Catalina M.D.   On: 11/04/2019 19:19       HEAR Score (for undifferentiated chest pain):  HEAR Score: 3    Assessment and Plan:   1.  NSTEMI: -Patient presented with complaints of breast pain with shortness of breath which began yesterday morning.  Prior to that she was in her usual state of health with no baseline shortness of breath,  fatigue or chest pain.  She has no prior history of CAD.  No CRFs.  -In the ED, EKG with no concern for acute changes -hsT found to be elevated at 157>>> 455 -CTA performed to rule out PE which showed atherosclerotic calcifications of the thoracic aorta without aneurysmal dilatation along with coronary calcifications and no pulmonary emboli are identified.  -Creatinine 1.14.  -CXR with no acute cardiopulmonary disease. -Plan to pursue LHC for further ischemic evaluation -We will start ASA, beta-blocker -On PTA statin, Crestor 20 mg p.o. daily  2.  Dementia: -PTA Namenda 28 mg p.o. daily   Severity of Illness: The appropriate patient status for this patient is OBSERVATION. Observation status is judged to be reasonable and necessary in order to provide the required intensity of service to ensure the patient's safety. The patient's presenting symptoms, physical exam findings, and initial radiographic and laboratory data in the context of their medical condition is felt to place them at decreased risk for further clinical deterioration. Furthermore, it is anticipated that the patient will be medically stable for discharge from the hospital within 2 midnights of admission. The following factors support the patient status of observation.   " The patient's presenting symptoms include chest pain. " The physical exam findings include chest pain. " The initial radiographic and laboratory data are elevated troponin.     For questions or updates, please contact [facility] Please consult [email or web address] for contact info under   Signed, Kathyrn Drown, NP  11/04/2019 8:03 PM   The patient was seen, examined and discussed with Kathyrn Drown, NP  and I agree with the above.   This is a very pleasant 69 year old female with no significant prior medical history of heart disease, with no family history of myocardial infarction or sudden cardiac death, patient has never smoked, but history  of diabetes,  hypertension or hyperlipidemia.  The patient presented to the ER with episodes of left-sided chest pain associated with shortness of breath that are on and off all day and increasing in frequency.  Her husband noticed that she was short of breath last night.  Patient is very pleasant however has significantly impaired short-term memory.  Her physical exam reveals a pleasant patient in no distress, no JVDs, no carotid bruits, S1-S2 with no significant murmur, clear lungs, no peripheral edema and good pulses bilaterally.  Her EKG shows sinus tachycardia, labs reveal creatinine of 1.1 with GFR of 50, high-sensitivity troponin of 157 followed by 455.  Her most recent cholesterol LDL 71, HDL 54 and triglycerides 85.  Hemoglobin 12.9 and platelets 233.  We will admit for non-STEMI, start on heparin IV infusion overnight, continue trending troponins and EKGs, she is currently asymptomatic.  Pros and cons of different diagnostic approaches are discussed with the patient and her husband, they wish to proceed with cardiac catheterization tomorrow, they understand risk and benefits.  Ena Dawley, MD 11/04/2019'

## 2019-11-04 NOTE — ED Provider Notes (Signed)
[facility] EMERGENCY DEPARTMENT Provider Note   CSN: [id number] Arrival date & time: 11/04/19  1334     History Chief Complaint  Patient presents with  . Breast Pain  . Shortness of Breath    Marie [name] is a 69 y.o. female.  HPI Patient is a 69 year old female with medical history of dementia, diabetes, hypertension, who presents for onset of lower left chest pain yesterday.  Onset occurred around 10 AM.  It is nonexertional.  Patient complained to her husband, that she was hurting on her side.  Pain continued throughout the day.  During the night, patient awoke complaining of shortness of breath.  She was able to go back to sleep.  Earlier today, while patient's husband was at work, she stated that she had increasing shortness of breath again.  Upon arrival in the ED, patient denies any current symptoms.  She does not recall symptoms yesterday, due to dementia.  History is provided mostly by the patient's husband.  Husband states that the only pain location she was complaining about was lower left chest.  She did not complain about any back pain, epigastric pain, or nausea.  Patient has no known lung disease and no known history of ACS.  She does not take an aspirin. HPI: A 69 year old patient with a history of hypercholesterolemia presents for evaluation of chest pain. Initial onset of pain was more than 6 hours ago. The patient's chest pain is well-localized, is sharp and is not worse with exertion. The patient's chest pain is middle- or left-sided, is not described as heaviness/pressure/tightness and does not radiate to the arms/jaw/neck. The patient does not complain of nausea and denies diaphoresis. The patient has no history of stroke, has no history of peripheral artery disease, has not smoked in the past 90 days, denies any history of treated diabetes, has no relevant family history of coronary artery disease (first degree relative at less than age 26), is not  hypertensive and does not have an elevated BMI (>=30).   Past Medical History:  Diagnosis Date  . Dementia ([place])   . Depression   . Diabetes mellitus   . Hypertension   . Thyroid disease   . Type II or unspecified type diabetes mellitus without mention of complication, not stated as uncontrolled    stopped metformin due to diarrhea, no meds now  . Vitamin D deficiency     Patient Active Problem List   Diagnosis Date Noted  . Chest pain of uncertain etiology   . NSTEMI (non-ST elevated myocardial infarction) ([place]) 11/04/2019  . Mixed hyperlipidemia   . Vascular dementia without behavioral disturbance ([place]) 05/21/2019  . Malignant neoplasm of upper-outer quadrant of right breast in female, estrogen receptor positive ([place]) 02/13/2018  . CKD stage 3 due to type 2 diabetes mellitus ([place]) 10/04/2017  . Body mass index (BMI) of 24.0-24.9 in adult 05/05/2015  . Generalized anxiety disorder 08/04/2014  . Essential hypertension 04/28/2014  . Medication management 04/28/2014  . Vitamin D deficiency 10/04/2013  . Type 2 diabetes mellitus with stage 3a chronic kidney disease, without long-term current use of insulin ([place])   . Depression, major, recurrent, in partial remission ([place])   . SDAT    . Hyperlipidemia associated with type 2 diabetes mellitus ([place]) 07/28/2007  . Allergic rhinitis 07/28/2007    Past Surgical History:  Procedure Laterality Date  . BREAST LUMPECTOMY WITH RADIOACTIVE SEED AND SENTINEL LYMPH NODE BIOPSY Right 03/08/2018   Procedure: BREAST LUMPECTOMY WITH RADIOACTIVE  SEED AND SENTINEL LYMPH NODE BIOPSY;  Surgeon: [name], MD;  Location: [facility];  Service: General;  Laterality: Right;  . FRACTURE SURGERY  2009   right arm; rod  . TOTAL KNEE ARTHROPLASTY  june 2013   left     OB History   No obstetric history on file.     Family History  Problem Relation Age of Onset  . Emphysema Mother   . Breast cancer Mother   . Dementia Father   .  Kidney failure Father   . Colon cancer Neg Hx   . Stomach cancer Neg Hx   . Ovarian cancer Neg Hx   . Prostate cancer Neg Hx     Social History   Tobacco Use  . Smoking status: Never Smoker  . Smokeless tobacco: Never Used  Substance Use Topics  . Alcohol use: No  . Drug use: No    Home Medications Prior to Admission medications   Medication Sig Start Date End Date Taking? Authorizing Provider  ALPRAZolam [name]) 1 MG tablet Take 1 mg by mouth daily as needed for anxiety. 10/29/19  Yes [provider]  Blood Glucose Monitoring Suppl (ONETOUCH VERIO) w/Device KIT 1 kit by Does not apply route as needed. Use to check glucose QD DX-E11.22 Patient taking differently: 1 kit by Does not apply route See admin instructions. Use to check glucose every day DX-E11.22 05/25/17  Yes [name], MD  cetirizine (ZYRTEC) 10 MG tablet Take 10 mg by mouth as needed for allergies.    Yes [provider]  cholecalciferol (VITAMIN D) 1000 units tablet Take 10,000 Units by mouth daily.   Yes [provider]  clobetasol ointment (TEMOVATE) 7.11 % Apply 1 application topically 2 (two) times daily. 06/25/19  Yes McClanahan, Danton Sewer, NP  DULoxetine (CYMBALTA) 60 MG capsule Take 1 capsule (60 mg total) by mouth daily. 01/09/18  Yes [name], MD  DUOBRII 0.01-0.045 % LOTN Apply 1 application topically daily. 11/01/19  Yes [provider]  glucose blood test strip CHECK BLOOD SUGAR 1 TIME DAILY-DX e11.22 Patient taking differently: 1 each by Other route See admin instructions. CHECK BLOOD SUGAR 1 TIME DAILY-DX e11.22 06/13/17  Yes Liane Comber, NP  memantine (NAMENDA XR) 28 MG CP24 24 hr capsule Take 28 mg by mouth daily.   Yes [provider]  metoCLOPramide (REGLAN) 10 MG tablet Take 1 tablet (10 mg total) by mouth every 6 (six) hours as needed for nausea (nausea/headache). 01/20/19  Yes Drenda Freeze, MD  Multiple Vitamin (MULTIVITAMIN) tablet Take 1  tablet by mouth daily.   Yes [provider]  ONE TOUCH LANCETS MISC Use as directed to check glucose daily. AF-B90.38 Patient taking differently: 1 each by Other route See admin instructions. Use as directed to check glucose daily. BF-X83.29 06/13/17  Yes Liane Comber, NP  rosuvastatin (CRESTOR) 20 MG tablet Take one tablet daily for cholesterol Patient taking differently: Take 20 mg by mouth daily.  07/30/18  Yes [name], MD  tamoxifen (NOLVADEX) 20 MG tablet Take 20 mg by mouth daily.   Yes [provider]  traZODone (DESYREL) 50 MG tablet Take 50 mg by mouth as needed for sleep.  03/05/18  Yes [provider]  triamcinolone ointment (KENALOG) 0.1 % Apply 1 application topically 2 (two) times daily. Apply sparingly to Rash of legs 2 x /day & cover with [place] Mule 12/31/18  Yes [name], MD  diphenhydrAMINE (BENADRYL) 25 MG tablet Take  25 mg by mouth as needed for itching or allergies.     [provider]  ondansetron (ZOFRAN) 8 MG tablet Take 1/2 to 1 tablet 2 to 3 x / day if needed for nausea / vomiting Patient not taking: Reported on 11/04/2019 06/06/17   [name], MD  predniSONE (STERAPRED UNI-PAK 21 TAB) 10 MG (21) TBPK tablet Take by mouth daily. Follow directions on Taper pack Patient not taking: Reported on 11/04/2019 06/25/19   Garnet Sierras, NP    Allergies    Penicillins  Review of Systems   Review of Systems  Constitutional: Negative for activity change, appetite change, chills, fatigue and fever.  HENT: Negative.  Negative for ear pain and sore throat.   Eyes: Negative.  Negative for pain and visual disturbance.  Respiratory: Positive for shortness of breath (intermittent). Negative for cough and wheezing.   Cardiovascular: Positive for chest pain (yesterday, lower left anterior location). Negative for palpitations and leg swelling.  Gastrointestinal: Negative.  Negative for abdominal distention, abdominal pain, nausea  and vomiting.  Genitourinary: Negative.  Negative for dysuria and hematuria.  Musculoskeletal: Negative for arthralgias and back pain.  Skin: Negative for color change and rash.  Neurological: Negative for dizziness, seizures, syncope, weakness, light-headedness and numbness.  Hematological: Does not bruise/bleed easily.  Psychiatric/Behavioral: Positive for confusion (chronic).  All other systems reviewed and are negative.   Physical Exam Updated Vital Signs BP 126/77   Pulse 78   Temp 97.6 F (36.4 C) (Oral)   Resp 19   Ht 5' 4.5" (1.638 m)   Wt 70 kg   SpO2 99%   BMI 26.08 kg/m   Physical Exam Vitals and nursing note reviewed.  Constitutional:      General: She is not in acute distress.    Appearance: She is well-developed and normal weight. She is not ill-appearing, toxic-appearing or diaphoretic.  HENT:     Head: Normocephalic and atraumatic.     Mouth/Throat:     Mouth: Mucous membranes are moist.     Pharynx: Oropharynx is clear.  Eyes:     Extraocular Movements: Extraocular movements intact.     Conjunctiva/sclera: Conjunctivae normal.  Neck:     Vascular: No JVD.  Cardiovascular:     Rate and Rhythm: Normal rate and regular rhythm.     Heart sounds: No murmur. Gallop present.   Pulmonary:     Effort: Pulmonary effort is normal. No tachypnea, accessory muscle usage or respiratory distress.     Breath sounds: Normal breath sounds. No decreased breath sounds, wheezing, rhonchi or rales.  Chest:     Chest wall: No tenderness.  Abdominal:     Palpations: Abdomen is soft.     Tenderness: There is no abdominal tenderness. There is no guarding.  Musculoskeletal:     Cervical back: Normal range of motion and neck supple.     Right lower leg: No tenderness. No edema.     Left lower leg: No tenderness. No edema.  Skin:    General: Skin is warm and dry.     Coloration: Skin is not pale.  Neurological:     General: No focal deficit present.     Mental Status: She  is alert.     Cranial Nerves: No cranial nerve deficit, dysarthria or facial asymmetry.     Sensory: Sensation is intact. No sensory deficit.     Motor: Motor function is intact. No weakness or abnormal muscle tone.     Coordination: Coordination  is intact.  Psychiatric:        Attention and Perception: Attention normal.        Mood and Affect: Mood and affect normal.        Speech: Speech normal.        Behavior: Behavior is cooperative.        Cognition and Memory: She exhibits impaired recent memory.     ED Results / Procedures / Treatments   Labs (all labs ordered are listed, but only abnormal results are displayed) Labs Reviewed  BASIC METABOLIC PANEL - Abnormal; Notable for the following components:      Result Value   Glucose, Bld 115 (*)    Creatinine, Ser 1.14 (*)    GFR calc non Af Amer 50 (*)    GFR calc Af Amer 58 (*)    All other components within normal limits  CBC - Abnormal; Notable for the following components:   WBC 13.1 (*)    All other components within normal limits  HEPARIN LEVEL (UNFRACTIONATED) - Abnormal; Notable for the following components:   Heparin Unfractionated 0.13 (*)    All other components within normal limits  BASIC METABOLIC PANEL - Abnormal; Notable for the following components:   Glucose, Bld 134 (*)    Creatinine, Ser 1.27 (*)    Calcium 8.8 (*)    GFR calc non Af Amer 44 (*)    GFR calc Af Amer 51 (*)    All other components within normal limits  TROPONIN I (HIGH SENSITIVITY) - Abnormal; Notable for the following components:   Troponin I (High Sensitivity) 157 (*)    All other components within normal limits  TROPONIN I (HIGH SENSITIVITY) - Abnormal; Notable for the following components:   Troponin I (High Sensitivity) 455 (*)    All other components within normal limits  SARS CORONAVIRUS 2 BY RT PCR (HOSPITAL ORDER, [facility])  CBC  HIV ANTIBODY (ROUTINE TESTING W REFLEX)  LIPID PANEL  HEPARIN LEVEL  (UNFRACTIONATED)    EKG EKG Interpretation  Date/Time:  Tuesday Nov 05 2019 02:46:31 EDT Ventricular Rate:  74 PR Interval:  130 QRS Duration: 98 QT Interval:  414 QTC Calculation: 460 R Axis:   35 Text Interpretation: Sinus rhythm Rate improved from prior Confirmed by Pryor Curia [phone]) on 11/05/2019 3:12:43 AM   Radiology DG Chest 2 View  Result Date: 11/04/2019 CLINICAL DATA:  Altered mental status.  Left chest pain. EXAM: CHEST - 2 VIEW COMPARISON:  Single-view of the chest 01/20/2019. FINDINGS: The lungs are clear. Heart size is normal. No pneumothorax or pleural effusion. Two mild compression fracture deformities of the thoracic spine at approximately T6 and T7 cannot be definitively characterized but appear remote. Intramedullary nail in the proximal right humerus is noted. Surgical clips right axilla are seen. IMPRESSION: No acute disease. Electronically Signed   By: Inge Rise M.D.   On: 11/04/2019 14:28   CT Angio Chest PE W and/or Wo Contrast  Result Date: 11/04/2019 CLINICAL DATA:  Shortness of breath EXAM: CT ANGIOGRAPHY CHEST WITH CONTRAST TECHNIQUE: Multidetector CT imaging of the chest was performed using the standard protocol during bolus administration of intravenous contrast. Multiplanar CT image reconstructions and MIPs were obtained to evaluate the vascular anatomy. CONTRAST:  76m OMNIPAQUE IOHEXOL 350 MG/ML SOLN COMPARISON:  Chest x-ray from earlier in the same day. FINDINGS: Cardiovascular: Atherosclerotic calcifications of the thoracic aorta are noted without aneurysmal dilatation. Dissection cannot be evaluated due to timing  of contrast bolus. No cardiac enlargement is seen. Coronary calcifications are noted. The pulmonary artery shows a normal branching pattern. No pulmonary emboli are identified. Mediastinum/Nodes: Thoracic inlet is within normal limits. No sizable hilar or mediastinal adenopathy is noted. The esophagus as visualized is within normal limits.  Lungs/Pleura: Lungs are well aerated bilaterally. No focal infiltrate or sizable effusion is seen. Some minimal subpleural scarring is noted in the anterior aspect of the right upper and right likely related to prior radiation therapy. Upper Abdomen: Upper pole right renal cyst is noted measuring approximately 3.6 cm. No other focal abnormality is noted in the upper abdomen. Musculoskeletal: Postsurgical changes are noted in the right breast consistent with the given clinical history. Degenerative changes of the thoracic spine are seen. T6 and T7 compression deformities are noted similar to that seen on the prior plain film examination. No rib abnormality is noted. Review of the MIP images confirms the above findings. IMPRESSION: No evidence of pulmonary emboli. Postsurgical and post radiation changes in the right breast and right lung as described. Right renal cyst. Chronic appearing compression deformities in the thoracic spine. Aortic Atherosclerosis (ICD10-I70.0). Electronically Signed   By: Inez Catalina M.D.   On: 11/04/2019 19:19   CARDIAC CATHETERIZATION  Result Date: 11/05/2019  Mid RCA lesion is 30% stenosed.  Prox RCA lesion is 20% stenosed.  Prox LAD to Mid LAD lesion is 20% stenosed.  The left ventricular systolic function is normal.  LV end diastolic pressure is normal.  The left ventricular ejection fraction is greater than 65% by visual estimate.  There is no mitral valve regurgitation.  1. Mild non-obstructive CAD 2. Normal LV systolic function 3. Mild elevation troponin with unclear etiology No further ischemic workup. Medical management of CAD    Procedures Procedures (including critical care time)  Medications Ordered in ED Medications  tamoxifen (NOLVADEX) tablet 20 mg ( Oral MAR Un[facility] 11/05/19 1512)  rosuvastatin (CRESTOR) tablet 20 mg ( Oral MAR [facility] 11/05/19 1512)  DULoxetine (CYMBALTA) DR capsule 60 mg ( Oral MAR Un[facility] 11/05/19 1512)  memantine (NAMENDA XR) 24 hr  capsule 28 mg ( Oral MAR Un[facility] 11/05/19 1512)  aspirin chewable tablet 324 mg ( Oral MAR Un[facility] 11/05/19 1512)    Or  aspirin suppository 300 mg ( Rectal MAR Unhold 11/05/19 1512)  aspirin EC tablet 81 mg ( Oral MAR Unhold 11/05/19 1512)  nitroGLYCERIN (NITROSTAT) SL tablet 0.4 mg ( Sublingual MAR Unhold 11/05/19 1512)  acetaminophen (TYLENOL) tablet 650 mg ( Oral MAR Unhold 11/05/19 1512)  ondansetron (ZOFRAN) injection 4 mg ( Intravenous MAR Un[facility] 11/05/19 1512)  sodium chloride flush (NS) 0.9 % injection 3 mL ( Intravenous MAR Unhold 11/05/19 1512)  labetalol (NORMODYNE) injection 10 mg (has no administration in time range)  hydrALAZINE (APRESOLINE) injection 10 mg (has no administration in time range)  0.9 %  sodium chloride infusion (has no administration in time range)  sodium chloride flush (NS) 0.9 % injection 3 mL (has no administration in time range)  sodium chloride flush (NS) 0.9 % injection 3 mL (has no administration in time range)  0.9 %  sodium chloride infusion (has no administration in time range)  sodium chloride flush (NS) 0.9 % injection 3 mL (3 mLs Intravenous Given 11/04/19 1949)  aspirin chewable tablet 324 mg (324 mg Oral Given 11/04/19 1823)  iohexol (OMNIPAQUE) 350 MG/ML injection 75 mL (59 mLs Intravenous Contrast Given 11/04/19 1907)  heparin bolus via infusion 4,000 Units (4,000 Units Intravenous Bolus from Bag  11/04/19 2021)  aspirin chewable tablet 81 mg (81 mg Oral Given 11/05/19 0602)    ED Course  I have reviewed the triage vital signs and the nursing notes.  Pertinent labs & imaging results that were available during my care of the patient were reviewed by me and considered in my medical decision making (see chart for details).    MDM Rules/Calculators/A&P HEAR Score: 3                    Patient 69 year old female who has been experiencing intermittent left lower chest pain and episodes of dyspnea since yesterday.  She has no history of ACS or VTE.  She does  not take any antiplatelet or anticoagulation medications.  She stays active at home.  She has a remote history of breast cancer, that she still takes estrogen blocker medication for.  She has been diagnosed with dementia and appears to suffer from short-term memory loss.  History is provided mostly by her husband, who accompanies her at the bedside.  Upon initial exam, patient is comfortable and denies any acute symptoms.  She is normotensive with normal heart rate.  Prior to being bedded in the ED, she was slightly tachycardic in triage.  Labs, obtained in triage, showed elevated troponin.  First troponin was 157 at 2 PM.  Delta troponin was 455 at 4:40 PM.  EKG shows no evidence of ST segment changes.  Other notable lab results include white blood cell count of 13.1, a nonspecific finding.  Hemoglobin and electrolytes are normal.  Aspirin, 324 mg, was given.  Given her symptoms of intermittent chest pain and dyspnea, in addition to tachycardia, present when she was in triage, CTA of chest was ordered based on suspicion of PE.  Upon reassessment, patient continued to rest comfortably and denied any symptoms.  PE study was negative for any evidence of thromboembolic disease.  Patient currently has an NSTEMI.  Heparin was started.  Cardiology was consulted.  Patient was admitted to cardiology service.  Final Clinical Impression(s) / ED Diagnoses Final diagnoses:  NSTEMI (non-ST elevated myocardial infarction) [facility])    Rx / [place] Orders ED Discharge Orders    None       Godfrey Pick, MD 11/05/19 1530    Noemi Chapel, MD 11/06/19 [phone]

## 2019-11-04 NOTE — ED Triage Notes (Signed)
Pt arrives with husband- pt seems confused and husband states this is her baseline. Pt told me she's here due to "she doesn't like her right breast its to big." Per husband shed has been saying her left breast hurts and she been having shortness of breath. Pt denies any sob or cp.

## 2019-11-04 NOTE — ED Notes (Signed)
Darlen Round granddaughter [phone] looking for an update on pt

## 2019-11-04 NOTE — Progress Notes (Signed)
ANTICOAGULATION CONSULT NOTE - Initial Consult  Pharmacy Consult for heparin Indication: chest pain/ACS  Allergies  Allergen Reactions  . Penicillins Rash    Swelling in face - last 10 years    Patient Measurements:   Heparin Dosing Weight: 63 kg (per family member)  Vital Signs: Temp: 98.2 F (36.8 C) (05/10 1338) Temp Source: Oral (05/10 1338) BP: 123/79 (05/10 1338) Pulse Rate: 116 (05/10 1338)  Labs: Recent Labs    11/04/19 1359 11/04/19 1640  HGB 12.9  --   HCT 38.9  --   PLT 233  --   CREATININE 1.14*  --   TROPONINIHS 157* 455*    CrCl cannot be calculated (Unknown ideal weight.).   Medical History: Past Medical History:  Diagnosis Date  . Dementia ([place])   . Depression   . Diabetes mellitus   . Hypertension   . Thyroid disease   . Type II or unspecified type diabetes mellitus without mention of complication, not stated as uncontrolled    stopped metformin due to diarrhea, no meds now  . Vitamin D deficiency     Medications:  (Not in a hospital admission)   Assessment: 40 YOF with breast pain and SOB with elevated troponins concerning for ACS. Pharmacy consulted to start IV heparin. H/H and Plt wnl. SCr 1.14.   Goal of Therapy:  Heparin level 0.3-0.7 units/ml Monitor platelets by anticoagulation protocol: Yes   Plan:  -Heparin 4000 units IV bolus followed by IV heparin at 850 units/hr -F/u 6 hr HL -Monitor daily HL, CBC and s/s of bleeding   Albertina Parr, PharmD., BCPS, BCCCP Clinical Pharmacist Clinical phone for 11/04/19 until 11:30pm: [phone] If after 11:30pm, please refer to [facility] for unit-specific pharmacist

## 2019-11-05 ENCOUNTER — Encounter (HOSPITAL_COMMUNITY): Payer: Self-pay | Admitting: Cardiology

## 2019-11-05 ENCOUNTER — Encounter (HOSPITAL_COMMUNITY)
Admission: EM | Disposition: A | Discharge status: Home / Self Care | Payer: Self-pay | Source: Home / Self Care | Attending: Emergency Medicine

## 2019-11-05 ENCOUNTER — Other Ambulatory Visit: Payer: Self-pay

## 2019-11-05 DIAGNOSIS — I2510 Atherosclerotic heart disease of native coronary artery without angina pectoris: Secondary | ICD-10-CM | POA: Diagnosis not present

## 2019-11-05 HISTORY — PX: CARDIAC CATHETERIZATION: SHX172

## 2019-11-05 HISTORY — PX: LEFT HEART CATH AND CORONARY ANGIOGRAPHY: CATH118249

## 2019-11-05 LAB — BASIC METABOLIC PANEL
Anion gap: 8 (ref 5–15)
BUN: 18 mg/dL (ref 8–23)
CO2: 24 mmol/L (ref 22–32)
Calcium: 8.8 mg/dL — ABNORMAL LOW (ref 8.9–10.3)
Chloride: 107 mmol/L (ref 98–111)
Creatinine, Ser: 1.27 mg/dL — ABNORMAL HIGH (ref 0.44–1.00)
GFR calc Af Amer: 51 mL/min — ABNORMAL LOW (ref >60)
GFR calc non Af Amer: 44 mL/min — ABNORMAL LOW (ref >60)
Glucose, Bld: 134 mg/dL — ABNORMAL HIGH (ref 70–99)
Potassium: 3.9 mmol/L (ref 3.5–5.1)
Sodium: 139 mmol/L (ref 135–145)

## 2019-11-05 LAB — CBC
HCT: 36.9 % (ref 36.0–46.0)
Hemoglobin: 12.2 g/dL (ref 12.0–15.0)
MCH: 30.7 pg (ref 26.0–34.0)
MCHC: 33.1 g/dL (ref 30.0–36.0)
MCV: 92.9 fL (ref 80.0–100.0)
Platelets: 185 10*3/uL (ref 150–400)
RBC: 3.97 MIL/uL (ref 3.87–5.11)
RDW: 12.1 % (ref 11.5–15.5)
WBC: 10.5 10*3/uL (ref 4.0–10.5)
nRBC: 0 % (ref 0.0–0.2)

## 2019-11-05 LAB — LIPID PANEL
Cholesterol: 164 mg/dL (ref 0–200)
HDL: 50 mg/dL (ref >40)
LDL Cholesterol: 87 mg/dL (ref 0–99)
Total CHOL/HDL Ratio: 3.3 RATIO
Triglycerides: 133 mg/dL (ref <150)
VLDL: 27 mg/dL (ref 0–40)

## 2019-11-05 LAB — GLUCOSE, CAPILLARY: Glucose-Capillary: 99 mg/dL (ref 70–99)

## 2019-11-05 LAB — HEPARIN LEVEL (UNFRACTIONATED)
Heparin Unfractionated: 0.13 IU/mL — ABNORMAL LOW (ref 0.30–0.70)
Heparin Unfractionated: 0.49 IU/mL (ref 0.30–0.70)

## 2019-11-05 LAB — HIV ANTIBODY (ROUTINE TESTING W REFLEX): HIV Screen 4th Generation wRfx: NONREACTIVE

## 2019-11-05 SURGERY — LEFT HEART CATH AND CORONARY ANGIOGRAPHY
Anesthesia: LOCAL

## 2019-11-05 MED ORDER — LABETALOL HCL 5 MG/ML IV SOLN: 10.0000 mg | INTRAVENOUS | Status: AC | PRN

## 2019-11-05 MED ORDER — HEPARIN (PORCINE) IN NACL 1000-0.9 UT/500ML-% IV SOLN
INTRAVENOUS | Status: AC
  Filled 2019-11-05: qty 1000

## 2019-11-05 MED ORDER — SODIUM CHLORIDE 0.9 % IV SOLN: 250.0000 mL | INTRAVENOUS | Status: DC | PRN

## 2019-11-05 MED ORDER — HEPARIN SODIUM (PORCINE) 1000 UNIT/ML IJ SOLN
INTRAMUSCULAR | Status: DC | PRN
  Administered 2019-11-05: 3500 [IU] via INTRAVENOUS

## 2019-11-05 MED ORDER — SODIUM CHLORIDE 0.9% FLUSH
3.0000 mL | Freq: Two times a day (BID) | INTRAVENOUS | Status: DC
  Administered 2019-11-06: 09:00:00 3 mL via INTRAVENOUS

## 2019-11-05 MED ORDER — HEPARIN SODIUM (PORCINE) 1000 UNIT/ML IJ SOLN
INTRAMUSCULAR | Status: AC
  Filled 2019-11-05: qty 1

## 2019-11-05 MED ORDER — HYDRALAZINE HCL 20 MG/ML IJ SOLN: 10.0000 mg | INTRAMUSCULAR | Status: AC | PRN

## 2019-11-05 MED ORDER — LIDOCAINE HCL (PF) 1 % IJ SOLN
INTRAMUSCULAR | Status: AC
  Filled 2019-11-05: qty 30

## 2019-11-05 MED ORDER — SODIUM CHLORIDE 0.9% FLUSH: 3.0000 mL | INTRAVENOUS | Status: DC | PRN

## 2019-11-05 MED ORDER — LIDOCAINE HCL (PF) 1 % IJ SOLN
INTRAMUSCULAR | Status: DC | PRN
  Administered 2019-11-05: 2 mL

## 2019-11-05 MED ORDER — HEPARIN (PORCINE) IN NACL 1000-0.9 UT/500ML-% IV SOLN
INTRAVENOUS | Status: DC | PRN
  Administered 2019-11-05 (×2): 500 mL

## 2019-11-05 MED ORDER — IOHEXOL 350 MG/ML SOLN
INTRAVENOUS | Status: DC | PRN
  Administered 2019-11-05: 70 mL

## 2019-11-05 MED ORDER — SODIUM CHLORIDE 0.9 % IV SOLN: INTRAVENOUS | Status: AC

## 2019-11-05 MED ORDER — VERAPAMIL HCL 2.5 MG/ML IV SOLN
INTRAVENOUS | Status: DC | PRN
  Administered 2019-11-05: 10 mL via INTRA_ARTERIAL

## 2019-11-05 MED ORDER — VERAPAMIL HCL 2.5 MG/ML IV SOLN
INTRAVENOUS | Status: AC
  Filled 2019-11-05: qty 2

## 2019-11-05 SURGICAL SUPPLY — 10 items
CATH 5FR JL3.5 JR4 ANG PIG MP (CATHETERS) ×2 IMPLANT
DEVICE RAD COMP TR BAND LRG (VASCULAR PRODUCTS) ×2 IMPLANT
GLIDESHEATH SLEND SS 6F .021 (SHEATH) ×2 IMPLANT
GUIDEWIRE INQWIRE 1.5J.035X260 (WIRE) ×1 IMPLANT
INQWIRE 1.5J .035X260CM (WIRE) ×2
KIT HEART LEFT (KITS) ×2 IMPLANT
PACK CARDIAC CATHETERIZATION (CUSTOM PROCEDURE TRAY) ×2 IMPLANT
SYR MEDRAD MARK 7 150ML (SYRINGE) ×2 IMPLANT
TRANSDUCER W/STOPCOCK (MISCELLANEOUS) ×2 IMPLANT
TUBING CIL FLEX 10 FLL-RA (TUBING) ×2 IMPLANT

## 2019-11-05 NOTE — Progress Notes (Signed)
[place] for heparin Indication: chest pain/ACS  Allergies  Allergen Reactions  . Penicillins Rash    Swelling in face - last 10 years    Patient Measurements: Height: 5' 4.5" (163.8 cm) Weight: 70 kg (154 lb 5.2 oz) IBW/kg (Calculated) : 55.85 Heparin Dosing Weight: 63 kg (per family member)  Vital Signs: Temp: 97.8 F (36.6 C) (05/11 0959) Temp Source: Oral (05/11 0959) BP: 132/62 (05/11 1230) Pulse Rate: 80 (05/11 1230)  Labs: Recent Labs    11/04/19 1359 11/04/19 1640 11/05/19 0259 11/05/19 1140  HGB 12.9  --  12.2  --   HCT 38.9  --  36.9  --   PLT 233  --  185  --   HEPARINUNFRC  --   --  0.13* 0.49  CREATININE 1.14*  --  1.27*  --   TROPONINIHS 157* 455*  --   --     Estimated Creatinine Clearance: 41.7 mL/min (A) (by C-G formula based on SCr of 1.27 mg/dL (H)).   Assessment: 62 YOF with breast pain and SOB with elevated troponins concerning for ACS. Pharmacy consulted to start IV heparin. Repeat heparin level is therapeutic at 0.49. CBC is WNL. No bleeding noted.  Goal of Therapy:  Heparin level 0.3-0.7 units/ml Monitor platelets by anticoagulation protocol: Yes   Plan:  Continue heparin gtt 1000 units/hr Daily heparin level and CBC F/u cath  Salome Arnt, PharmD, BCPS Clinical Pharmacist Please see AMION for all pharmacy numbers 11/05/2019 12:50 PM

## 2019-11-05 NOTE — Progress Notes (Signed)
Progress Note  Patient Name: Marie [name] Date of Encounter: 11/05/2019  Primary Cardiologist: New to [place] for [facility] today. No recurrnet chest pain. Denies SOB    Inpatient Medications    Scheduled Meds: . aspirin  324 mg Oral NOW   Or  . aspirin  300 mg Rectal NOW  . aspirin EC  81 mg Oral Daily  . DULoxetine  60 mg Oral Daily  . memantine  28 mg Oral Daily  . rosuvastatin  20 mg Oral Daily  . sodium chloride flush  3 mL Intravenous Q12H  . tamoxifen  20 mg Oral Daily   Continuous Infusions: . sodium chloride    . sodium chloride 1 mL/kg/hr (11/05/19 0506)  . heparin 1,000 Units/hr (11/05/19 0404)   PRN Meds: sodium chloride, acetaminophen, nitroGLYCERIN, ondansetron (ZOFRAN) IV, sodium chloride flush   Vital Signs    Vitals:   11/05/19 0600 11/05/19 0700 11/05/19 0740 11/05/19 0740  BP: 113/69 126/66    Pulse: 80 86    Resp: 20 18    Temp:    97.7 F (36.5 C)  TempSrc:    Oral  SpO2: 99% 94%    Weight:   70 kg   Height:   5' 4.5" (1.638 m)    No intake or output data in the 24 hours ending 11/05/19 0833 Filed Weights   11/04/19 2300 11/05/19 0740  Weight: 70 kg 70 kg    Physical Exam   General: Well developed, well nourished, NAD Skin: Warm, dry, intact  Lungs:Clear to ausculation bilaterally. No wheezes, rales, or rhonchi. Breathing is unlabored. Cardiovascular: RRR with S1 S2. No murmurs Abdomen: Soft, non-tender, non-distended. No obvious abdominal masses. Extremities: No edema. Radial  pulses 2+ bilaterally Neuro: Alert and oriented. No focal deficits. No facial asymmetry. MAE spontaneously. Psych: Responds to questions appropriately with normal affect.    Labs    Chemistry Recent Labs  Lab 11/04/19 1359 11/05/19 0259  NA 142 139  K 3.8 3.9  CL 105 107  CO2 25 24  GLUCOSE 115* 134*  BUN 17 18  CREATININE 1.14* 1.27*  CALCIUM 8.9 8.8*  GFRNONAA 50* 44*  GFRAA 58* 51*  ANIONGAP 12 8      Hematology Recent Labs  Lab 11/04/19 1359 11/05/19 0259  WBC 13.1* 10.5  RBC 4.16 3.97  HGB 12.9 12.2  HCT 38.9 36.9  MCV 93.5 92.9  MCH 31.0 30.7  MCHC 33.2 33.1  RDW 12.3 12.1  PLT 233 185    Cardiac EnzymesNo results for input(s): TROPONINI in the last 168 hours. No results for input(s): TROPIPOC in the last 168 hours.   BNPNo results for input(s): BNP, PROBNP in the last 168 hours.   DDimer No results for input(s): DDIMER in the last 168 hours.   Radiology    DG Chest 2 View  Result Date: 11/04/2019 CLINICAL DATA:  Altered mental status.  Left chest pain. EXAM: CHEST - 2 VIEW COMPARISON:  Single-view of the chest 01/20/2019. FINDINGS: The lungs are clear. Heart size is normal. No pneumothorax or pleural effusion. Two mild compression fracture deformities of the thoracic spine at approximately T6 and T7 cannot be definitively characterized but appear remote. Intramedullary nail in the proximal right humerus is noted. Surgical clips right axilla are seen. IMPRESSION: No acute disease. Electronically Signed   By: Inge Rise M.D.   On: 11/04/2019 14:28   CT Angio Chest PE W and/or Wo  Contrast  Result Date: 11/04/2019 CLINICAL DATA:  Shortness of breath EXAM: CT ANGIOGRAPHY CHEST WITH CONTRAST TECHNIQUE: Multidetector CT imaging of the chest was performed using the standard protocol during bolus administration of intravenous contrast. Multiplanar CT image reconstructions and MIPs were obtained to evaluate the vascular anatomy. CONTRAST:  33mL OMNIPAQUE IOHEXOL 350 MG/ML SOLN COMPARISON:  Chest x-ray from earlier in the same day. FINDINGS: Cardiovascular: Atherosclerotic calcifications of the thoracic aorta are noted without aneurysmal dilatation. Dissection cannot be evaluated due to timing of contrast bolus. No cardiac enlargement is seen. Coronary calcifications are noted. The pulmonary artery shows a normal branching pattern. No pulmonary emboli are identified.  Mediastinum/Nodes: Thoracic inlet is within normal limits. No sizable hilar or mediastinal adenopathy is noted. The esophagus as visualized is within normal limits. Lungs/Pleura: Lungs are well aerated bilaterally. No focal infiltrate or sizable effusion is seen. Some minimal subpleural scarring is noted in the anterior aspect of the right upper and right likely related to prior radiation therapy. Upper Abdomen: Upper pole right renal cyst is noted measuring approximately 3.6 cm. No other focal abnormality is noted in the upper abdomen. Musculoskeletal: Postsurgical changes are noted in the right breast consistent with the given clinical history. Degenerative changes of the thoracic spine are seen. T6 and T7 compression deformities are noted similar to that seen on the prior plain film examination. No rib abnormality is noted. Review of the MIP images confirms the above findings. IMPRESSION: No evidence of pulmonary emboli. Postsurgical and post radiation changes in the right breast and right lung as described. Right renal cyst. Chronic appearing compression deformities in the thoracic spine. Aortic Atherosclerosis (ICD10-I70.0). Electronically Signed   By: Inez Catalina M.D.   On: 11/04/2019 19:19   Telemetry    11/05/19 NSR - Personally Reviewed  ECG    No new tracing as of 11/05/19- Personally Reviewed  Cardiac Studies   None>>LHC today   Patient Profile     69 y.o. female with history of right breast CA, diet-controlled DM 2, and dementia who presented to the ED on 11/04/2019 with left lower chest/breast pain and shortness of breath found to have an elevated troponin.  Assessment & Plan    1.  NSTEMI: -Patient presented with complaints of left chest/breast pain with shortness of breath which began yesterday morning. She has no prior history of CAD.  No CRFs.  -EKG with no concern for acute changes -hsT found to be elevated at 157>>> 455 -CTA performed to rule out PE which showed  atherosclerotic calcifications of the thoracic aorta without aneurysmal dilatation along with coronary calcifications and no pulmonary emboli are identified.   -Creatinine 1.14>>1.27 today  -Plan to pursue LHC for further ischemic evaluation -Continue ASA, beta-blocker, statin   2.  Dementia: -Continue PTA Namenda 28 mg p.o. daily -Husband at bedside who acts as her primary caretaker   3. Renal insufficiency: -Creatinine, 1.27 today with a baseline that appears to be in the 1.2-1.3 -Would limit contrast to avoid contrast induced nephrotoxicity  -Pre-cath hydration   Signed, Kathyrn Drown NP-C [place] Pager: [phone] 11/05/2019, 8:33 AM     For questions or updates, please contact   Please consult [email or web address] for contact info under Cardiology/STEMI.

## 2019-11-05 NOTE — ED Notes (Signed)
Informed consent at bedside. Signed by husband with consent of patient.

## 2019-11-05 NOTE — Interval H&P Note (Signed)
History and Physical Interval Note:  11/05/2019 2:02 PM  Marie [name]  has presented today for surgery, with the diagnosis of NSTEMI.  The various methods of treatment have been discussed with the patient and family. After consideration of risks, benefits and other options for treatment, the patient has consented to  Procedure(s): LEFT HEART CATH AND CORONARY ANGIOGRAPHY (N/A) as a surgical intervention.  The patient's history has been reviewed, patient examined, no change in status, stable for surgery.  I have reviewed the patient's chart and labs.  Questions were answered to the patient's satisfaction.    Cath Lab Visit (complete for each Cath Lab visit)  Clinical Evaluation Leading to the Procedure:   ACS: Yes.    Non-ACS:    Anginal Classification: CCS III  Anti-ischemic medical therapy: No Therapy  Non-Invasive Test Results: No non-invasive testing performed  Prior CABG: No previous CABG        Lauree Chandler

## 2019-11-05 NOTE — Progress Notes (Signed)
[place] for heparin Indication: chest pain/ACS  Allergies  Allergen Reactions  . Penicillins Rash    Swelling in face - last 10 years    Patient Measurements: Weight: 70 kg (154 lb 5.2 oz) Heparin Dosing Weight: 63 kg (per family member)  Vital Signs: BP: 122/84 (05/10 2100) Pulse Rate: 94 (05/10 2345)  Labs: Recent Labs    11/04/19 1359 11/04/19 1640 11/05/19 0259  HGB 12.9  --  12.2  HCT 38.9  --  36.9  PLT 233  --  185  HEPARINUNFRC  --   --  0.13*  CREATININE 1.14*  --  1.27*  TROPONINIHS 157* 455*  --     CrCl cannot be calculated (Unknown ideal weight.).   Medical History: Past Medical History:  Diagnosis Date  . Dementia ([place])   . Depression   . Diabetes mellitus   . Hypertension   . Thyroid disease   . Type II or unspecified type diabetes mellitus without mention of complication, not stated as uncontrolled    stopped metformin due to diarrhea, no meds now  . Vitamin D deficiency      Assessment: 35 YOF with breast pain and SOB with elevated troponins concerning for ACS. Pharmacy consulted to start IV heparin. H/H and Plt wnl. SCr 1.14.   5/11 AM update:  Heparin level low Mild inc in trop  Goal of Therapy:  Heparin level 0.3-0.7 units/ml Monitor platelets by anticoagulation protocol: Yes   Plan:  -Inc heparin to 1000 units/hr -1200 heparin level -Monitor daily HL, CBC and s/s of bleeding   Narda Bonds, PharmD, BCPS Clinical Pharmacist Phone: [phone]

## 2019-11-06 DIAGNOSIS — I2510 Atherosclerotic heart disease of native coronary artery without angina pectoris: Secondary | ICD-10-CM

## 2019-11-06 DIAGNOSIS — R079 Chest pain, unspecified: Secondary | ICD-10-CM | POA: Diagnosis not present

## 2019-11-06 LAB — CBC
HCT: 34.4 % — ABNORMAL LOW (ref 36.0–46.0)
Hemoglobin: 11.7 g/dL — ABNORMAL LOW (ref 12.0–15.0)
MCH: 31.1 pg (ref 26.0–34.0)
MCHC: 34 g/dL (ref 30.0–36.0)
MCV: 91.5 fL (ref 80.0–100.0)
Platelets: 198 10*3/uL (ref 150–400)
RBC: 3.76 MIL/uL — ABNORMAL LOW (ref 3.87–5.11)
RDW: 12 % (ref 11.5–15.5)
WBC: 8.3 10*3/uL (ref 4.0–10.5)
nRBC: 0 % (ref 0.0–0.2)

## 2019-11-06 LAB — BASIC METABOLIC PANEL
Anion gap: 6 (ref 5–15)
BUN: 18 mg/dL (ref 8–23)
CO2: 22 mmol/L (ref 22–32)
Calcium: 8.3 mg/dL — ABNORMAL LOW (ref 8.9–10.3)
Chloride: 107 mmol/L (ref 98–111)
Creatinine, Ser: 1.24 mg/dL — ABNORMAL HIGH (ref 0.44–1.00)
GFR calc Af Amer: 52 mL/min — ABNORMAL LOW (ref >60)
GFR calc non Af Amer: 45 mL/min — ABNORMAL LOW (ref >60)
Glucose, Bld: 153 mg/dL — ABNORMAL HIGH (ref 70–99)
Potassium: 4.2 mmol/L (ref 3.5–5.1)
Sodium: 135 mmol/L (ref 135–145)

## 2019-11-06 LAB — GLUCOSE, CAPILLARY: Glucose-Capillary: 151 mg/dL — ABNORMAL HIGH (ref 70–99)

## 2019-11-06 IMAGING — MG DIGITAL SCREENING BILATERAL MAMMOGRAM WITH TOMO AND CAD
8 series · 8 of 24 positions shown · non-contrast
Comparison: None

CLINICAL DATA: Screening.

EXAM:
DIGITAL SCREENING BILATERAL MAMMOGRAM WITH TOMO AND CAD

[L MLO synth-2D]
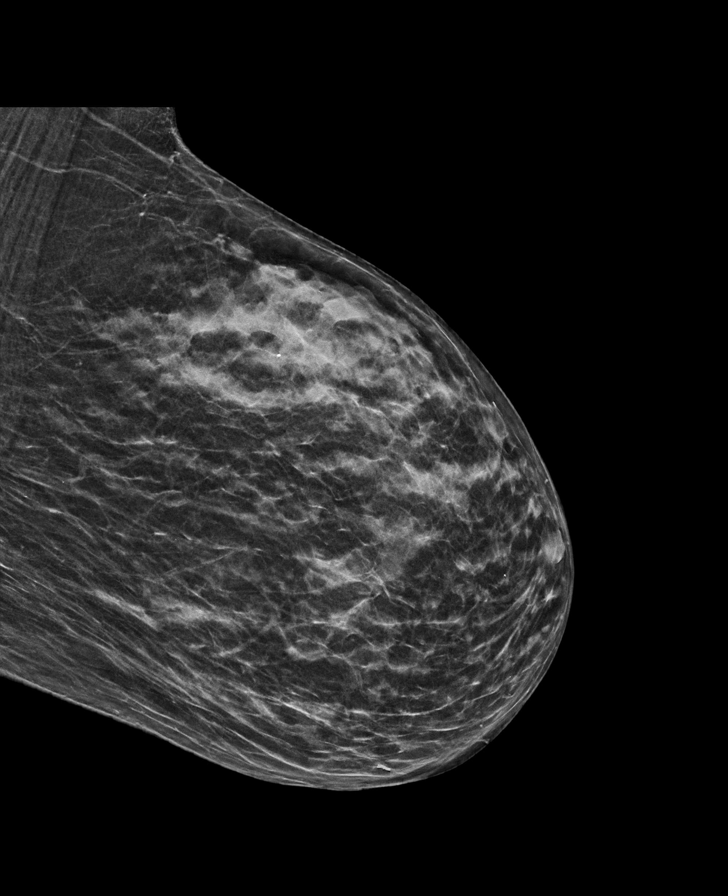

[R CC synth-2D]
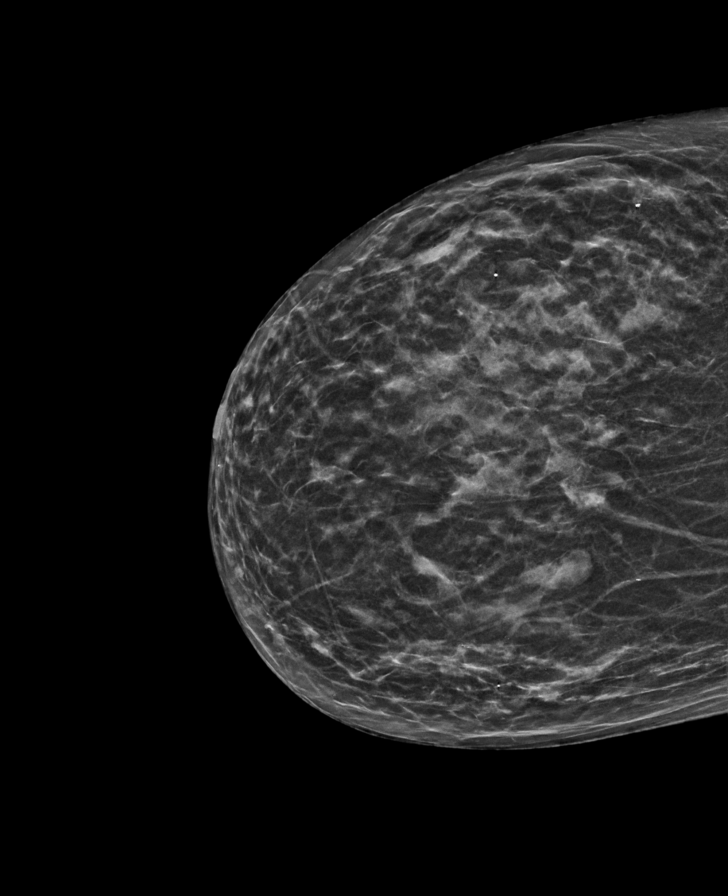

[L CC synth-2D]
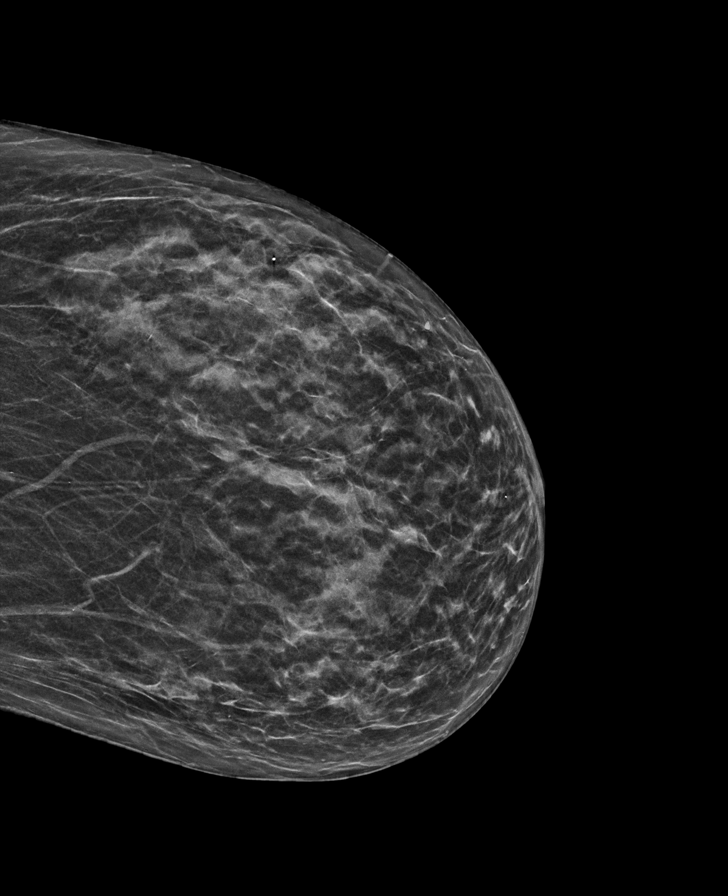

[R MLO synth-2D]
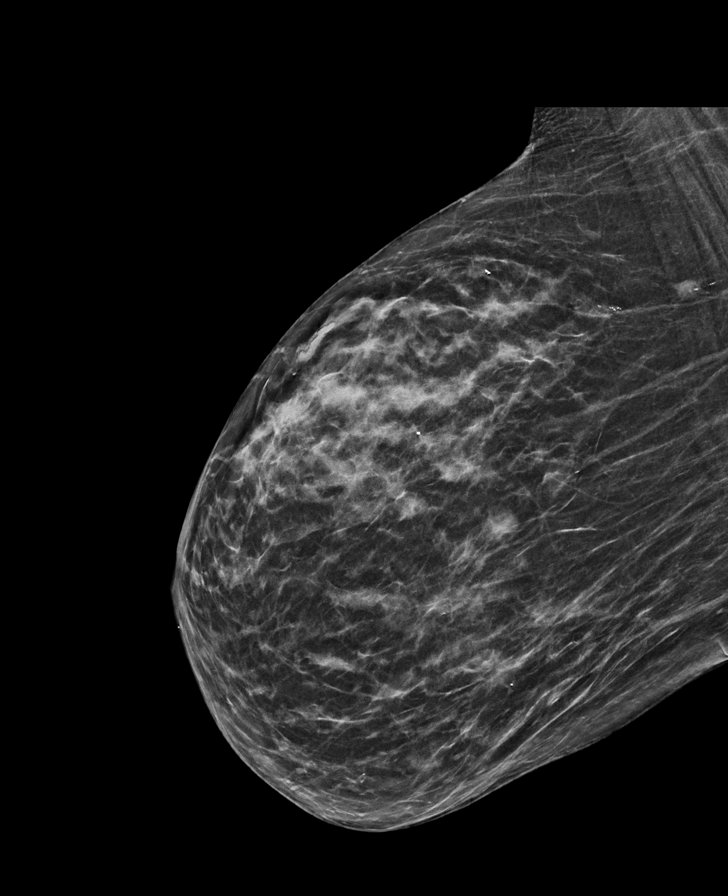

[L MLO tomo · tomo slice 25/49.0]
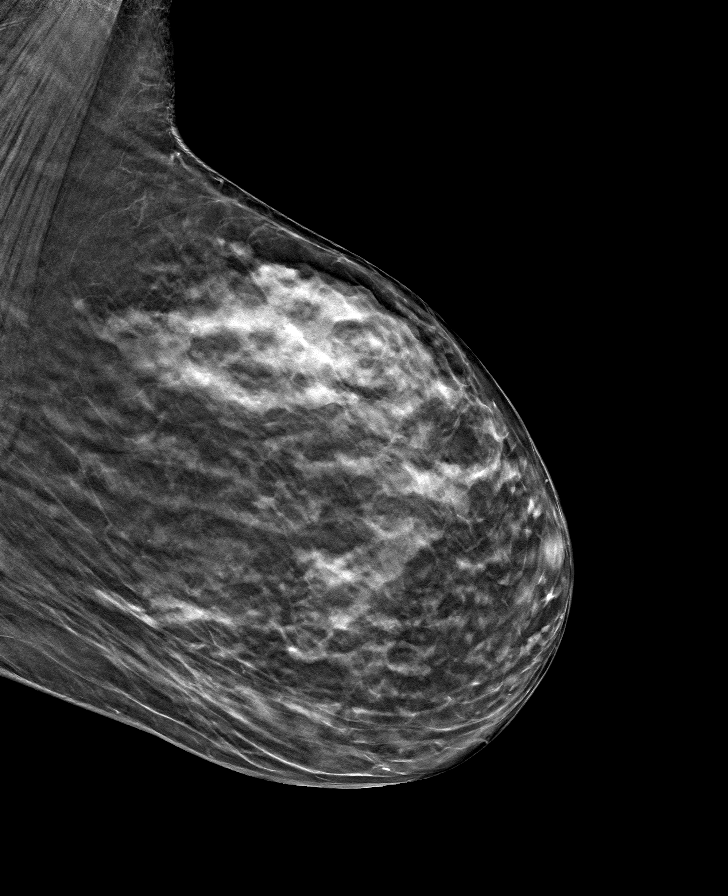

[R CC tomo · tomo slice 23/46.0]
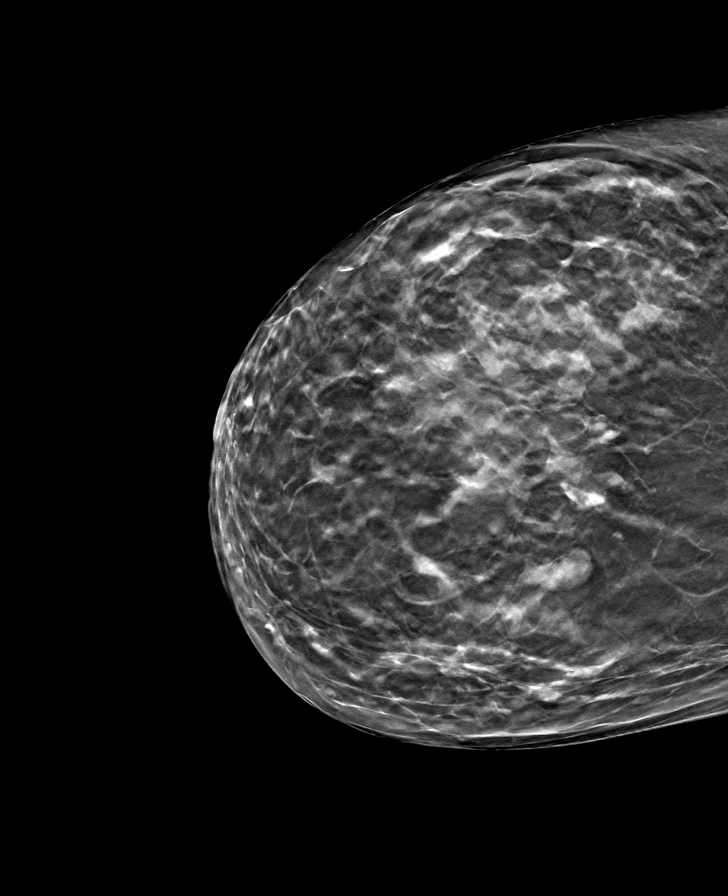

[L CC tomo · tomo slice 23/46.0]
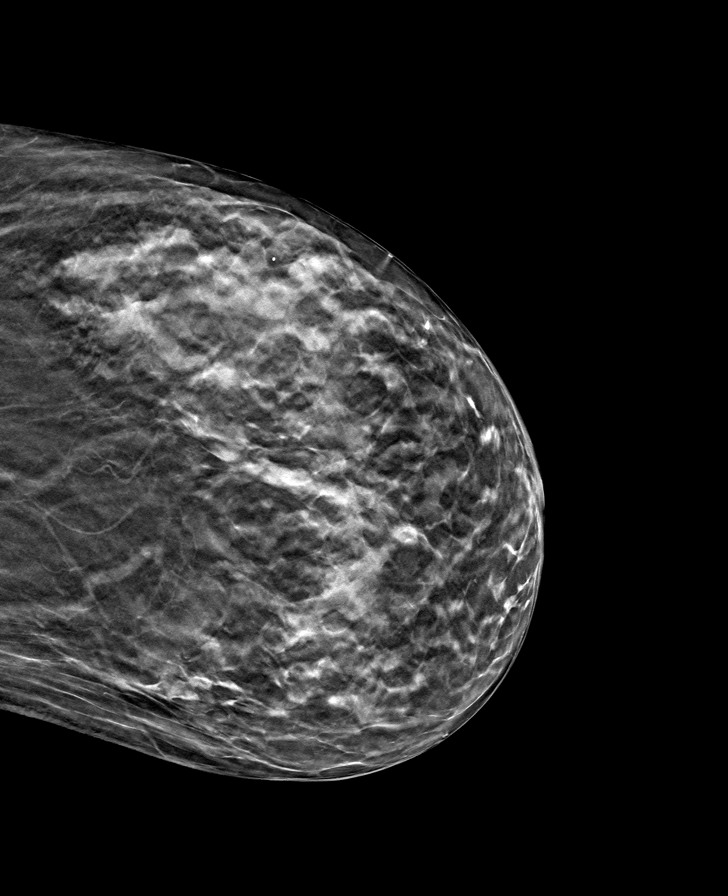

[R MLO tomo · tomo slice 27/52.0]
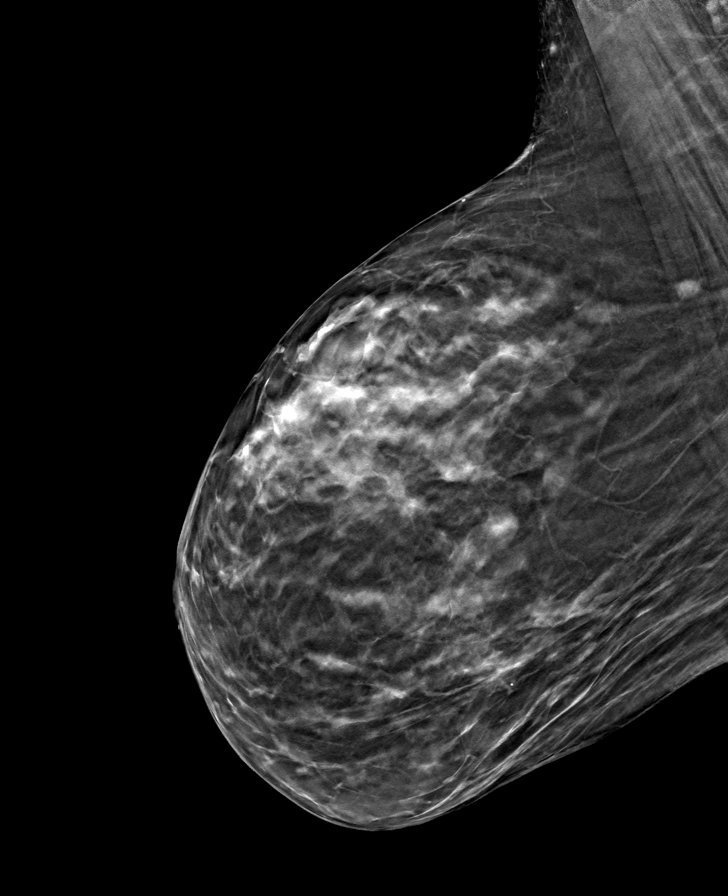

[8 of 24 positions shown; findings below may reference images not displayed]

ACR Breast Density Category c: The breast tissue is heterogeneously
dense, which may obscure small masses.
FINDINGS: In the right breast, a possible mass warrants further evaluation. In
the left breast, no findings suspicious for malignancy. Images were
processed with CAD.
IMPRESSION: Further evaluation is suggested for possible mass in the right
breast.

RECOMMENDATION:
Diagnostic mammogram and possibly ultrasound of the right breast.
(Code:[id number])

The patient will be contacted regarding the findings, and additional
imaging will be scheduled.

BI-RADS CATEGORY  0: Incomplete. Need additional imaging evaluation
and/or prior mammograms for comparison.

## 2019-11-06 MED ORDER — ASPIRIN 81 MG PO TBEC: 81.0000 mg | DELAYED_RELEASE_TABLET | Freq: Every day | ORAL | Status: DC

## 2019-11-06 MED ORDER — NITROGLYCERIN 0.4 MG SL SUBL: 0.4000 mg | SUBLINGUAL_TABLET | SUBLINGUAL | 0 refills | Status: DC | PRN

## 2019-11-06 MED ORDER — TRAZODONE HCL 50 MG PO TABS
50.0000 mg | ORAL_TABLET | Freq: Every evening | ORAL | Status: DC | PRN
  Administered 2019-11-06: 50 mg via ORAL
  Filled 2019-11-06: qty 1

## 2019-11-06 NOTE — Discharge Instructions (Signed)
Radial Site Care  This sheet gives you information about how to care for yourself after your procedure. Your health care provider may also give you more specific instructions. If you have problems or questions, contact your health care provider. What can I expect after the procedure? After the procedure, it is common to have:  Bruising and tenderness at the catheter insertion area. Follow these instructions at home: Medicines  Take over-the-counter and prescription medicines only as told by your health care provider. Insertion site care  Follow instructions from your health care provider about how to take care of your insertion site. Make sure you: ? Wash your hands with soap and water before you change your bandage (dressing). If soap and water are not available, use hand sanitizer. ? Change your dressing as told by your health care provider. ? Leave stitches (sutures), skin glue, or adhesive strips in place. These skin closures may need to stay in place for 2 weeks or longer. If adhesive strip edges start to loosen and curl up, you may trim the loose edges. Do not remove adhesive strips completely unless your health care provider tells you to do that.  Check your insertion site every day for signs of infection. Check for: ? Redness, swelling, or pain. ? Fluid or blood. ? Pus or a bad smell. ? Warmth.  Do not take baths, swim, or use a hot tub until your health care provider approves.  You may shower 24-48 hours after the procedure, or as directed by your health care provider. ? Remove the dressing and gently wash the site with plain soap and water. ? Pat the area dry with a clean towel. ? Do not rub the site. That could cause bleeding.  Do not apply powder or lotion to the site. Activity   For 24 hours after the procedure, or as directed by your health care provider: ? Do not flex or bend the affected arm. ? Do not push or pull heavy objects with the affected arm. ? Do not  drive yourself home from the hospital or clinic. You may drive 24 hours after the procedure unless your health care provider tells you not to. ? Do not operate machinery or power tools.  Do not lift anything that is heavier than 10 lb (4.5 kg), or the limit that you are told, until your health care provider says that it is safe.  Ask your health care provider when it is okay to: ? Return to work or school. ? Resume usual physical activities or sports. ? Resume sexual activity. General instructions  If the catheter site starts to bleed, raise your arm and put firm pressure on the site. If the bleeding does not stop, get help right away. This is a medical emergency.  If you went home on the same day as your procedure, a responsible adult should be with you for the first 24 hours after you arrive home.  Keep all follow-up visits as told by your health care provider. This is important. Contact a health care provider if:  You have a fever.  You have redness, swelling, or yellow drainage around your insertion site. Get help right away if:  You have unusual pain at the radial site.  The catheter insertion area swells very fast.  The insertion area is bleeding, and the bleeding does not stop when you hold steady pressure on the area.  Your arm or hand becomes pale, cool, tingly, or numb. These symptoms may represent a serious problem   that is an emergency. Do not wait to see if the symptoms will go away. Get medical help right away. Call your local emergency services (911 in the [place] Do not drive yourself to the hospital. Summary  After the procedure, it is common to have bruising and tenderness at the site.  Follow instructions from your health care provider about how to take care of your radial site wound. Check the wound every day for signs of infection.  Do not lift anything that is heavier than 10 lb (4.5 kg), or the limit that you are told, until your health care provider says  that it is safe. This information is not intended to replace advice given to you by your health care provider. Make sure you discuss any questions you have with your health care provider. Document Revised: 07/19/2017 Document Reviewed: 07/19/2017 Elsevier Patient Education  2020 [employer]    Heart-Healthy Eating Plan Heart-healthy meal planning includes:  Eating less unhealthy fats.  Eating more healthy fats.  Making other changes in your diet. Talk with your doctor or a diet specialist (dietitian) to create an eating plan that is right for you. What is my plan? Your doctor may recommend an eating plan that includes:  Total fat: ______% or less of total calories a day.  Saturated fat: ______% or less of total calories a day.  Cholesterol: less than _________mg a day. What are tips for following this plan? Cooking Avoid frying your food. Try to bake, boil, grill, or broil it instead. You can also reduce fat by:  Removing the skin from poultry.  Removing all visible fats from meats.  Steaming vegetables in water or broth. Meal planning   At meals, divide your plate into four equal parts: ? Fill one-half of your plate with vegetables and green salads. ? Fill one-fourth of your plate with whole grains. ? Fill one-fourth of your plate with lean protein foods.  Eat 4-5 servings of vegetables per day. A serving of vegetables is: ? 1 cup of raw or cooked vegetables. ? 2 cups of raw leafy greens.  Eat 4-5 servings of fruit per day. A serving of fruit is: ? 1 medium whole fruit. ?  cup of dried fruit. ?  cup of fresh, frozen, or canned fruit. ?  cup of 100% fruit juice.  Eat more foods that have soluble fiber. These are apples, broccoli, carrots, beans, peas, and barley. Try to get 20-30 g of fiber per day.  Eat 4-5 servings of nuts, legumes, and seeds per week: ? 1 serving of dried beans or legumes equals  cup after being cooked. ? 1 serving of nuts is   cup. ? 1 serving of seeds equals 1 tablespoon. General information  Eat more home-cooked food. Eat less restaurant, buffet, and fast food.  Limit or avoid alcohol.  Limit foods that are high in starch and sugar.  Avoid fried foods.  Lose weight if you are overweight.  Keep track of how much salt (sodium) you eat. This is important if you have high blood pressure. Ask your doctor to tell you more about this.  Try to add vegetarian meals each week. Fats  Choose healthy fats. These include olive oil and canola oil, flaxseeds, walnuts, almonds, and seeds.  Eat more omega-3 fats. These include salmon, mackerel, sardines, tuna, flaxseed oil, and ground flaxseeds. Try to eat fish at least 2 times each week.  Check food labels. Avoid foods with trans fats or high amounts of saturated fat.    Limit saturated fats. ? These are often found in animal products, such as meats, butter, and cream. ? These are also found in plant foods, such as palm oil, palm kernel oil, and coconut oil.  Avoid foods with partially hydrogenated oils in them. These have trans fats. Examples are stick margarine, some tub margarines, cookies, crackers, and other baked goods. What foods can I eat? Fruits All fresh, canned (in natural juice), or frozen fruits. Vegetables Fresh or frozen vegetables (raw, steamed, roasted, or grilled). Green salads. Grains Most grains. Choose whole wheat and whole grains most of the time. Rice and pasta, including brown rice and pastas made with whole wheat. Meats and other proteins Lean, well-trimmed beef, veal, pork, and lamb. Chicken and turkey without skin. All fish and shellfish. Wild duck, rabbit, pheasant, and venison. Egg whites or low-cholesterol egg substitutes. Dried beans, peas, lentils, and tofu. Seeds and most nuts. Dairy Low-fat or nonfat cheeses, including ricotta and mozzarella. Skim or 1% milk that is liquid, powdered, or evaporated. Buttermilk that is made with  low-fat milk. Nonfat or low-fat yogurt. Fats and oils Non-hydrogenated (trans-free) margarines. Vegetable oils, including soybean, sesame, sunflower, olive, peanut, safflower, corn, canola, and cottonseed. Salad dressings or mayonnaise made with a vegetable oil. Beverages Mineral water. Coffee and tea. Diet carbonated beverages. Sweets and desserts Sherbet, gelatin, and fruit ice. Small amounts of dark chocolate. Limit all sweets and desserts. Seasonings and condiments All seasonings and condiments. The items listed above may not be a complete list of foods and drinks you can eat. Contact a dietitian for more options. What foods should I avoid? Fruits Canned fruit in heavy syrup. Fruit in cream or butter sauce. Fried fruit. Limit coconut. Vegetables Vegetables cooked in cheese, cream, or butter sauce. Fried vegetables. Grains Breads that are made with saturated or trans fats, oils, or whole milk. Croissants. Sweet rolls. Donuts. High-fat crackers, such as cheese crackers. Meats and other proteins Fatty meats, such as hot dogs, ribs, sausage, bacon, rib-eye roast or steak. High-fat deli meats, such as salami and bologna. Caviar. Domestic duck and goose. Organ meats, such as liver. Dairy Cream, sour cream, cream cheese, and creamed cottage cheese. Whole-milk cheeses. Whole or 2% milk that is liquid, evaporated, or condensed. Whole buttermilk. Cream sauce or high-fat cheese sauce. Yogurt that is made from whole milk. Fats and oils Meat fat, or shortening. Cocoa butter, hydrogenated oils, palm oil, coconut oil, palm kernel oil. Solid fats and shortenings, including bacon fat, salt pork, lard, and butter. Nondairy cream substitutes. Salad dressings with cheese or sour cream. Beverages Regular sodas and juice drinks with added sugar. Sweets and desserts Frosting. Pudding. Cookies. Cakes. Pies. Milk chocolate or white chocolate. Buttered syrups. Full-fat ice cream or ice cream drinks. The items  listed above may not be a complete list of foods and drinks to avoid. Contact a dietitian for more information. Summary  Heart-healthy meal planning includes eating less unhealthy fats, eating more healthy fats, and making other changes in your diet.  Eat a balanced diet. This includes fruits and vegetables, low-fat or nonfat dairy, lean protein, nuts and legumes, whole grains, and heart-healthy oils and fats. This information is not intended to replace advice given to you by your health care provider. Make sure you discuss any questions you have with your health care provider. Document Revised: 08/17/2017 Document Reviewed: 07/21/2017 Elsevier Patient Education  2020 [employer] Inc.  

## 2019-11-06 NOTE — Progress Notes (Signed)
Progress Note  Patient Name: Marie [name] Date of Encounter: 11/06/2019  Primary Cardiologist: Dr Meda Coffee  Subjective   No CP or dyspnea; confused  Inpatient Medications    Scheduled Meds: . aspirin EC  81 mg Oral Daily  . DULoxetine  60 mg Oral Daily  . memantine  28 mg Oral Daily  . rosuvastatin  20 mg Oral Daily  . sodium chloride flush  3 mL Intravenous Q12H  . sodium chloride flush  3 mL Intravenous Q12H  . tamoxifen  20 mg Oral Daily   Continuous Infusions: . sodium chloride     PRN Meds: sodium chloride, acetaminophen, nitroGLYCERIN, ondansetron (ZOFRAN) IV, sodium chloride flush, traZODone   Vital Signs    Vitals:   11/05/19 1745 11/05/19 2142 11/06/19 0003 11/06/19 0700  BP: (!) 137/113 112/66 (!) 155/63 122/72  Pulse: 93 86 94 89  Resp: 17 16 (!) 24 (!) 32  Temp:  98.2 F (36.8 C) 98.2 F (36.8 C) 98.5 F (36.9 C)  TempSrc:  Oral Oral Oral  SpO2: 99% 99% 96% 96%  Weight:      Height:        Intake/Output Summary (Last 24 hours) at 11/06/2019 0835 Last data filed at 11/05/2019 2200 Gross per 24 hour  Intake 3 ml  Output --  Net 3 ml   Last 3 Weights 11/05/2019 11/04/2019 06/25/2019  Weight (lbs) 154 lb 5.2 oz 154 lb 5.2 oz 153 lb 3.2 oz  Weight (kg) 70 kg 70 kg 69.491 kg      Telemetry    Sinus- Personally Reviewed   Physical Exam   GEN: No acute distress.   Neck: No JVD Cardiac: RRR, no murmurs, rubs, or gallops.  Respiratory: Clear to auscultation bilaterally. GI: Soft, nontender, non-distended  MS: No edema Neuro:  Nonfocal  Psych: Normal affect   Labs    High Sensitivity Troponin:   Recent Labs  Lab 11/04/19 1359 11/04/19 1640  TROPONINIHS 157* 455*      Chemistry Recent Labs  Lab 11/04/19 1359 11/05/19 0259 11/06/19 0512  NA 142 139 135  K 3.8 3.9 4.2  CL 105 107 107  CO2 25 24 22   GLUCOSE 115* 134* 153*  BUN 17 18 18   CREATININE 1.14* 1.27* 1.24*  CALCIUM 8.9 8.8* 8.3*  GFRNONAA 50* 44* 45*  GFRAA 58*  51* 52*  ANIONGAP 12 8 6      Hematology Recent Labs  Lab 11/04/19 1359 11/05/19 0259 11/06/19 0512  WBC 13.1* 10.5 8.3  RBC 4.16 3.97 3.76*  HGB 12.9 12.2 11.7*  HCT 38.9 36.9 34.4*  MCV 93.5 92.9 91.5  MCH 31.0 30.7 31.1  MCHC 33.2 33.1 34.0  RDW 12.3 12.1 12.0  PLT 233 185 198    Radiology    DG Chest 2 View  Result Date: 11/04/2019 CLINICAL DATA:  Altered mental status.  Left chest pain. EXAM: CHEST - 2 VIEW COMPARISON:  Single-view of the chest 01/20/2019. FINDINGS: The lungs are clear. Heart size is normal. No pneumothorax or pleural effusion. Two mild compression fracture deformities of the thoracic spine at approximately T6 and T7 cannot be definitively characterized but appear remote. Intramedullary nail in the proximal right humerus is noted. Surgical clips right axilla are seen. IMPRESSION: No acute disease. Electronically Signed   By: Inge Rise M.D.   On: 11/04/2019 14:28   CT Angio Chest PE W and/or Wo Contrast  Result Date: 11/04/2019 CLINICAL DATA:  Shortness of breath EXAM: CT ANGIOGRAPHY  CHEST WITH CONTRAST TECHNIQUE: Multidetector CT imaging of the chest was performed using the standard protocol during bolus administration of intravenous contrast. Multiplanar CT image reconstructions and MIPs were obtained to evaluate the vascular anatomy. CONTRAST:  38mL OMNIPAQUE IOHEXOL 350 MG/ML SOLN COMPARISON:  Chest x-ray from earlier in the same day. FINDINGS: Cardiovascular: Atherosclerotic calcifications of the thoracic aorta are noted without aneurysmal dilatation. Dissection cannot be evaluated due to timing of contrast bolus. No cardiac enlargement is seen. Coronary calcifications are noted. The pulmonary artery shows a normal branching pattern. No pulmonary emboli are identified. Mediastinum/Nodes: Thoracic inlet is within normal limits. No sizable hilar or mediastinal adenopathy is noted. The esophagus as visualized is within normal limits. Lungs/Pleura: Lungs are  well aerated bilaterally. No focal infiltrate or sizable effusion is seen. Some minimal subpleural scarring is noted in the anterior aspect of the right upper and right likely related to prior radiation therapy. Upper Abdomen: Upper pole right renal cyst is noted measuring approximately 3.6 cm. No other focal abnormality is noted in the upper abdomen. Musculoskeletal: Postsurgical changes are noted in the right breast consistent with the given clinical history. Degenerative changes of the thoracic spine are seen. T6 and T7 compression deformities are noted similar to that seen on the prior plain film examination. No rib abnormality is noted. Review of the MIP images confirms the above findings. IMPRESSION: No evidence of pulmonary emboli. Postsurgical and post radiation changes in the right breast and right lung as described. Right renal cyst. Chronic appearing compression deformities in the thoracic spine. Aortic Atherosclerosis (ICD10-I70.0). Electronically Signed   By: Inez Catalina M.D.   On: 11/04/2019 19:19   CARDIAC CATHETERIZATION  Result Date: 11/05/2019  Mid RCA lesion is 30% stenosed.  Prox RCA lesion is 20% stenosed.  Prox LAD to Mid LAD lesion is 20% stenosed.  The left ventricular systolic function is normal.  LV end diastolic pressure is normal.  The left ventricular ejection fraction is greater than 65% by visual estimate.  There is no mitral valve regurgitation.  1. Mild non-obstructive CAD 2. Normal LV systolic function 3. Mild elevation troponin with unclear etiology No further ischemic workup. Medical management of CAD    Patient Profile     69 y.o. female with past medical history of breast cancer, diabetes mellitus, dementia admitted with chest pain and elevated troponin.  Cardiac catheterization revealed nonobstructive coronary disease and normal LV function.  CTA showed no pulmonary embolus.  Assessment & Plan    1 chest pain-troponin was elevated.  However cardiac  catheterization reveals nonobstructive coronary disease and normal LV function.  CTA showed no pulmonary embolus.  We will plan medical therapy.  Continue aspirin and statin.  2 dementia-continue preadmission medications at discharge.  3 chronic stage III kidney disease-creatinine minimally increased.  Unchanged today.  We will plan to follow as an outpatient.  Discharge today on present medications.  Follow-up Dr. Meda Coffee in 4 to 6 weeks. Greater than 30 minutes PA and physician time. D2  For questions or updates, please contact [facility] Please consult [email or web address] for contact info under        Signed, Kirk Ruths, MD  11/06/2019, 8:35 AM

## 2019-11-06 NOTE — Progress Notes (Signed)
   11/06/19 0700  Assess: MEWS Score  Temp 98.5 F (36.9 C)  BP 122/72  Pulse Rate 89  ECG Heart Rate 95  Resp (!) 32  Level of Consciousness Alert  SpO2 96 %  O2 Device Room Air  Assess: MEWS Score  MEWS Temp 0  MEWS Systolic 0  MEWS Pulse 0  MEWS RR 2  MEWS LOC 0  MEWS Score 2  MEWS Score Color Yellow  Assess: if the MEWS score is Yellow or Red  Were vital signs taken at a resting state? Yes  Focused Assessment Documented focused assessment  Early Detection of Sepsis Score *See Row Information* Low  MEWS guidelines implemented *See Row Information* Yes  Treat  MEWS Interventions Escalated (See documentation below)  Take Vital Signs  Increase Vital Sign Frequency  Yellow: Q 2hr X 2 then Q 4hr X 2, if remains yellow, continue Q 4hrs  Escalate  MEWS: Escalate Yellow: discuss with charge nurse/RN and consider discussing with provider and RRT  Notify: Charge Nurse/RN  Name of Charge Nurse/RN Notified Janett Billow  Date Charge Nurse/RN Notified 11/06/19  Time Charge Nurse/RN Notified 0745  Document  Patient Outcome Other (Comment) (Pt eating)  Progress note created (see row info) Yes

## 2019-11-06 NOTE — Discharge Summary (Signed)
Discharge Summary    Patient ID: Marie [name] MRN: [id number]; DOB: 07/04/1950  Admit date: 11/04/2019 Discharge date: 11/06/2019  Primary Care Provider: [name], MD  Primary Cardiologist: Ena Dawley, MD   Discharge Diagnoses    Active Problems:   Hyperlipidemia associated with type 2 diabetes mellitus [facility])   Essential hypertension   Vascular dementia without behavioral disturbance [facility])   NSTEMI (non-ST elevated myocardial infarction) [facility])   Chest pain of uncertain etiology   CAD (coronary artery disease)  Diagnostic Studies/Procedures    LHC 11/05/19:    Mid RCA lesion is 30% stenosed.  Prox RCA lesion is 20% stenosed.  Prox LAD to Mid LAD lesion is 20% stenosed.  The left ventricular systolic function is normal.  LV end diastolic pressure is normal.  The left ventricular ejection fraction is greater than 65% by visual estimate.  There is no mitral valve regurgitation.   1. Mild non-obstructive CAD 2. Normal LV systolic function 3. Mild elevation troponin with unclear etiology  No further ischemic workup. Medical management of CAD  History of Present Illness     Marie [name] is a 69 y.o. female with a history of right breast CA, diet-controlled DM 2, and dementia who presented to the ED on 11/04/2019 with left lower chest/breast pain and shortness of breath found to have an elevated troponin.  Hospital Course     Marie [name] is a 69 year old female with a history stated above who presented to [facility] on 11/04/2019 with complaints of left chest/breast pain and shortness of breath.  HPI obtained from husband who was at bedside secondary to dementia.  Husband reported that around 53 yesterday morning his wife reported left side/chest pain reported as a nagging, constant feeling.  He stated that she initially thought she slept on her side wrong and therefore went on about her day. Later on in the afternoon, she continued to have complaints.   Husband then reported that she was woken with gasping breathlessness. He asked her to turn onto her right side and she eventually went back to sleep.  Symptoms were still present upon waking today therefore they came to the ED for further evaluation.  He reports that she has no history of CAD.  No history of tobacco or alcohol use.  Has baseline dementia and is followed by her PCP for care. Husband is her primary caregiver at this time.   In the ED, HST found to be elevated at 157 with a repeat of 455. CTA with atherosclerotic calcifications of the thoracic aorta without aneurysmal dilatation along with coronary calcifications and no pulmonary emboli are identified.  Creatinine 1.14. CXR with no acute cardiopulmonary disease.  EKG with sinus tachycardia with HR 112bpm and no acute ischemic changes.  Plan was for [facility] for further ischemic evaluation.  This was performed 11/05/2019 which showed mild nonobstructive CAD with normal LV systolic function with no further ischemic work-up recommended and medical management of CAD.  Unclear etiology of elevated troponin. CTA performed with no pulmonary embolus.  Plan is for ASA and statin therapies.  Consultants: None  The patient was seen and examined by Dr. Stanford Breed he feels that she is stable and ready for discharge today, 11/06/2019.  Cath site stable.  Did the patient have an acute coronary syndrome (MI, NSTEMI, STEMI, etc) this admission?:  No.   The elevated Troponin was due to the acute medical illness (demand ischemia).  _____________  Discharge Vitals Blood pressure (!) 117/56, pulse 90, temperature 98.9  F (37.2 C), temperature source Oral, resp. rate 17, height 5' 4.5" (1.638 m), weight 70 kg, SpO2 97 %.  Filed Weights   11/04/19 2300 11/05/19 0740  Weight: 70 kg 70 kg   Labs & Radiologic Studies    CBC Recent Labs    11/05/19 0259 11/06/19 0512  WBC 10.5 8.3  HGB 12.2 11.7*  HCT 36.9 34.4*  MCV 92.9 91.5  PLT 185 423   Basic  Metabolic Panel Recent Labs    11/05/19 0259 11/06/19 0512  NA 139 135  K 3.9 4.2  CL 107 107  CO2 24 22  GLUCOSE 134* 153*  BUN 18 18  CREATININE 1.27* 1.24*  CALCIUM 8.8* 8.3*   Liver Function Tests No results for input(s): AST, ALT, ALKPHOS, BILITOT, PROT, ALBUMIN in the last 72 hours. No results for input(s): LIPASE, AMYLASE in the last 72 hours. High Sensitivity Troponin:   Recent Labs  Lab 11/04/19 1359 11/04/19 1640  TROPONINIHS 157* 455*    BNP Invalid input(s): POCBNP D-Dimer No results for input(s): DDIMER in the last 72 hours. Hemoglobin A1C No results for input(s): HGBA1C in the last 72 hours. Fasting Lipid Panel Recent Labs    11/05/19 0259  CHOL 164  HDL 50  LDLCALC 87  TRIG 133  CHOLHDL 3.3   Thyroid Function Tests No results for input(s): TSH, T4TOTAL, T3FREE, THYROIDAB in the last 72 hours.  Invalid input(s): FREET3 _____________  DG Chest 2 View  Result Date: 11/04/2019 CLINICAL DATA:  Altered mental status.  Left chest pain. EXAM: CHEST - 2 VIEW COMPARISON:  Single-view of the chest 01/20/2019. FINDINGS: The lungs are clear. Heart size is normal. No pneumothorax or pleural effusion. Two mild compression fracture deformities of the thoracic spine at approximately T6 and T7 cannot be definitively characterized but appear remote. Intramedullary nail in the proximal right humerus is noted. Surgical clips right axilla are seen. IMPRESSION: No acute disease. Electronically Signed   By: Inge Rise M.D.   On: 11/04/2019 14:28   CT Angio Chest PE W and/or Wo Contrast  Result Date: 11/04/2019 CLINICAL DATA:  Shortness of breath EXAM: CT ANGIOGRAPHY CHEST WITH CONTRAST TECHNIQUE: Multidetector CT imaging of the chest was performed using the standard protocol during bolus administration of intravenous contrast. Multiplanar CT image reconstructions and MIPs were obtained to evaluate the vascular anatomy. CONTRAST:  67m OMNIPAQUE IOHEXOL 350 MG/ML SOLN  COMPARISON:  Chest x-ray from earlier in the same day. FINDINGS: Cardiovascular: Atherosclerotic calcifications of the thoracic aorta are noted without aneurysmal dilatation. Dissection cannot be evaluated due to timing of contrast bolus. No cardiac enlargement is seen. Coronary calcifications are noted. The pulmonary artery shows a normal branching pattern. No pulmonary emboli are identified. Mediastinum/Nodes: Thoracic inlet is within normal limits. No sizable hilar or mediastinal adenopathy is noted. The esophagus as visualized is within normal limits. Lungs/Pleura: Lungs are well aerated bilaterally. No focal infiltrate or sizable effusion is seen. Some minimal subpleural scarring is noted in the anterior aspect of the right upper and right likely related to prior radiation therapy. Upper Abdomen: Upper pole right renal cyst is noted measuring approximately 3.6 cm. No other focal abnormality is noted in the upper abdomen. Musculoskeletal: Postsurgical changes are noted in the right breast consistent with the given clinical history. Degenerative changes of the thoracic spine are seen. T6 and T7 compression deformities are noted similar to that seen on the prior plain film examination. No rib abnormality is noted. Review of the MIP images  confirms the above findings. IMPRESSION: No evidence of pulmonary emboli. Postsurgical and post radiation changes in the right breast and right lung as described. Right renal cyst. Chronic appearing compression deformities in the thoracic spine. Aortic Atherosclerosis (ICD10-I70.0). Electronically Signed   By: Inez Catalina M.D.   On: 11/04/2019 19:19   CARDIAC CATHETERIZATION  Result Date: 11/05/2019  Mid RCA lesion is 30% stenosed.  Prox RCA lesion is 20% stenosed.  Prox LAD to Mid LAD lesion is 20% stenosed.  The left ventricular systolic function is normal.  LV end diastolic pressure is normal.  The left ventricular ejection fraction is greater than 65% by visual  estimate.  There is no mitral valve regurgitation.  1. Mild non-obstructive CAD 2. Normal LV systolic function 3. Mild elevation troponin with unclear etiology No further ischemic workup. Medical management of CAD   Disposition   Pt is being discharged home today in good condition.  Follow-up Plans & Appointments   Follow-up Information    Burtis Junes, NP Follow up on 11/27/2019.   Specialties: Nurse Practitioner, Interventional Cardiology, Cardiology, Radiology Why: At 9:45 AM Contact information: [employer] [address] [id number] [phone]          Discharge Instructions    Call MD for:  difficulty breathing, headache or visual disturbances   Complete by: As directed    Call MD for:  extreme fatigue   Complete by: As directed    Call MD for:  hives   Complete by: As directed    Call MD for:  persistant dizziness or light-headedness   Complete by: As directed    Call MD for:  persistant nausea and vomiting   Complete by: As directed    Call MD for:  redness, tenderness, or signs of infection (pain, swelling, redness, odor or green/yellow discharge around incision site)   Complete by: As directed    Call MD for:  severe uncontrolled pain   Complete by: As directed    Call MD for:  temperature >100.4   Complete by: As directed    Diet - low sodium heart healthy   Complete by: As directed    Discharge instructions   Complete by: As directed    No driving for 3-5 days. No lifting over 5 lbs for 1 week. No sexual activity for 1 week. Keep procedure site clean & dry. If you notice increased pain, swelling, bleeding or pus, call/return!  You may shower, but no soaking baths/hot tubs/pools for 1 week.   Increase activity slowly   Complete by: As directed      Discharge Medications   Allergies as of 11/06/2019      Reactions   Penicillins Rash   Swelling in face - last 10 years      Medication List    TAKE these medications   ALPRAZolam 1 MG  tablet Commonly known as: XANAX Take 1 mg by mouth daily as needed for anxiety.   aspirin 81 MG EC tablet Take 1 tablet (81 mg total) by mouth daily. Start taking on: Nov 07, 2019   cetirizine 10 MG tablet Commonly known as: ZYRTEC Take 10 mg by mouth as needed for allergies.   cholecalciferol 1000 units tablet Commonly known as: VITAMIN D Take 10,000 Units by mouth daily.   clobetasol ointment 0.05 % Commonly known as: TEMOVATE Apply 1 application topically 2 (two) times daily.   diphenhydrAMINE 25 MG tablet Commonly known as: BENADRYL Take 25 mg by  mouth as needed for itching or allergies.   DULoxetine 60 MG capsule Commonly known as: CYMBALTA Take 1 capsule (60 mg total) by mouth daily.   Duobrii 0.01-0.045 % Lotn Generic drug: Halobetasol Prop-Tazarotene Apply 1 application topically daily.   glucose blood test strip CHECK BLOOD SUGAR 1 TIME DAILY-DX e11.22 What changed:   how much to take  how to take this  when to take this   metoCLOPramide 10 MG tablet Commonly known as: Reglan Take 1 tablet (10 mg total) by mouth every 6 (six) hours as needed for nausea (nausea/headache).   multivitamin tablet Take 1 tablet by mouth daily.   Namenda XR 28 MG Cp24 24 hr capsule Generic drug: memantine Take 28 mg by mouth daily.   nitroGLYCERIN 0.4 MG SL tablet Commonly known as: NITROSTAT Place 1 tablet (0.4 mg total) under the tongue every 5 (five) minutes x 3 doses as needed for chest pain.   ondansetron 8 MG tablet Commonly known as: Zofran Take 1/2 to 1 tablet 2 to 3 x / day if needed for nausea / vomiting   ONE TOUCH LANCETS Misc Use as directed to check glucose daily. DX-E11.22 What changed:   how much to take  how to take this  when to take this   OneTouch Verio w/Device Kit 1 kit by Does not apply route as needed. Use to check glucose QD DX-E11.22 What changed:   when to take this  additional instructions   predniSONE 10 MG (21) Tbpk  tablet Commonly known as: STERAPRED UNI-PAK 21 TAB Take by mouth daily. Follow directions on Taper pack   rosuvastatin 20 MG tablet Commonly known as: Crestor Take one tablet daily for cholesterol What changed:   how much to take  how to take this  when to take this  additional instructions   tamoxifen 20 MG tablet Commonly known as: NOLVADEX Take 20 mg by mouth daily.   traZODone 50 MG tablet Commonly known as: DESYREL Take 50 mg by mouth as needed for sleep.   triamcinolone ointment 0.1 % Commonly known as: KENALOG Apply 1 application topically 2 (two) times daily. Apply sparingly to Rash of legs 2 x /day & cover with Saran Wrap       Outstanding Labs/Studies   None   Duration of Discharge Encounter   Greater than 30 minutes including physician time.  Signed, Kathyrn Drown, NP 11/06/2019, 11:30 AM

## 2019-11-13 ENCOUNTER — Telehealth: Payer: Self-pay | Admitting: *Deleted

## 2019-11-13 NOTE — Telephone Encounter (Signed)
Called patient on 11/13/2019 , 9:03 AM in an attempt to reach the patient for a hospital follow up. Spoke with the patient's spouse, who is the patient's caregiver.   Admit date: 11/04/19 Discharge: 11/06/19   She does not have any questions or concerns about medications from the hospital admission. The patient's medications were reviewed over the phone, they were counseled to bring in all current medications to the hospital follow up visit.   I advised the patient to call if any questions or concerns arise about the hospital admission or medications. Spouse states they filled the new medications and started them, but he is not sure of the names and had no questions.    Home health was not started in the hospital.  All questions were answered and a follow up appointment was made. Patient has an appointment with Vicie Mutters, PA, on 11/19/2019, for a hospital follow up visit.  Prior to Admission medications   Medication Sig Start Date End Date Taking? Authorizing Provider  ALPRAZolam [name]) 1 MG tablet Take 1 mg by mouth daily as needed for anxiety. 10/29/19   [provider]  aspirin EC 81 MG EC tablet Take 1 tablet (81 mg total) by mouth daily. 11/07/19   Kathyrn Drown D, NP  Blood Glucose Monitoring Suppl (ONETOUCH VERIO) w/Device KIT 1 kit by Does not apply route as needed. Use to check glucose QD DX-E11.22 Patient taking differently: 1 kit by Does not apply route See admin instructions. Use to check glucose every day DX-E11.22 05/25/17   [name], MD  cetirizine (ZYRTEC) 10 MG tablet Take 10 mg by mouth as needed for allergies.     [provider]  cholecalciferol (VITAMIN D) 1000 units tablet Take 10,000 Units by mouth daily.    [provider]  clobetasol ointment (TEMOVATE) 4.28 % Apply 1 application topically 2 (two) times daily. 06/25/19   Garnet Sierras, NP  diphenhydrAMINE (BENADRYL) 25 MG tablet Take 25 mg by mouth as needed for itching or  allergies.     [provider]  DULoxetine (CYMBALTA) 60 MG capsule Take 1 capsule (60 mg total) by mouth daily. 01/09/18   [name], MD  DUOBRII 0.01-0.045 % LOTN Apply 1 application topically daily. 11/01/19   [provider]  glucose blood test strip CHECK BLOOD SUGAR 1 TIME DAILY-DX e11.22 Patient taking differently: 1 each by Other route See admin instructions. CHECK BLOOD SUGAR 1 TIME DAILY-DX e11.22 06/13/17   Liane Comber, NP  memantine (NAMENDA XR) 28 MG CP24 24 hr capsule Take 28 mg by mouth daily.    [provider]  metoCLOPramide (REGLAN) 10 MG tablet Take 1 tablet (10 mg total) by mouth every 6 (six) hours as needed for nausea (nausea/headache). 01/20/19   Drenda Freeze, MD  Multiple Vitamin (MULTIVITAMIN) tablet Take 1 tablet by mouth daily.    [provider]  nitroGLYCERIN (NITROSTAT) 0.4 MG SL tablet Place 1 tablet (0.4 mg total) under the tongue every 5 (five) minutes x 3 doses as needed for chest pain. 11/06/19   [name], NP  ondansetron (ZOFRAN) 8 MG tablet Take 1/2 to 1 tablet 2 to 3 x / day if needed for nausea / vomiting Patient not taking: Reported on 11/04/2019 06/06/17   [name], MD  ONE [facility] LANCETS MISC Use as directed to check glucose daily. JG-O11.57 Patient taking differently: 1 each by Other route See admin instructions. Use as directed to check glucose daily. DX-E11.22 06/13/17  Liane Comber, NP  predniSONE (STERAPRED UNI-PAK 21 TAB) 10 MG (21) TBPK tablet Take by mouth daily. Follow directions on Taper pack Patient not taking: Reported on 11/04/2019 06/25/19   Garnet Sierras, NP  rosuvastatin (CRESTOR) 20 MG tablet Take one tablet daily for cholesterol Patient taking differently: Take 20 mg by mouth daily.  07/30/18   [name], MD  tamoxifen (NOLVADEX) 20 MG tablet Take 20 mg by mouth daily.    [provider]  traZODone (DESYREL) 50 MG tablet Take 50 mg by mouth as needed for  sleep.  03/05/18   [provider]  triamcinolone ointment (KENALOG) 0.1 % Apply 1 application topically 2 (two) times daily. Apply sparingly to Rash of legs 2 x /day & cover with Thornell Mule 12/31/18   [name], MD

## 2019-11-18 NOTE — Progress Notes (Signed)
Hospital follow up  Assessment and Plan:  NSTEMI (non-ST elevated myocardial infarction) ([place]) Will add on zetia Will continue bASA Monitor sugar when she is due Close follow up Increase exercise  SDAT  -     donepezil (ARICEPT) 10 MG tablet; Take 1 tablet (10 mg total) by mouth at bedtime. Worsening memory Will refer back to neuro Will get new MRI with new incontinence and abnormal vision test.   Urinary incontinence, unspecified type -     Urinalysis, Routine w reflex microscopic -     Urine Culture -     CBC with Differential/Platelet -     COMPLETE METABOLIC PANEL WITH GFR -     sulfamethoxazole-trimethoprim (BACTRIM DS) 800-160 MG tablet; Take 1 tablet by mouth 2 (two) times daily.  Dysarthria Progressive with abnormal vision/EOM test, will get MRI brain and refer to neuro'  Iron deficiency -     Ferritin -     Iron  B12 deficiency -     Vitamin B12  Encephalopathy -     Vitamin B12 -     Ferritin -     Iron -     Sedimentation rate -     ANA -     Anti-DNA antibody, double-stranded -     MR Brain W Wo Contrast; Future -     Ammonia -     Ammonia -     MICROSCOPIC MESSAGE ? Worsening dementia/infection/progressive neuro will get MRI and refer to neuro  Other symptoms and signs involving the nervous system -     CBC with Differential/Platelet -     MR Brain W Wo Contrast; Future     All medications were reviewed with patient and family and fully reconciled. All questions answered fully, and patient and family members were encouraged to call the office with any further questions or concerns. Discussed goal to avoid readmission related to this diagnosis.  Medications Discontinued During This Encounter  Medication Reason  . memantine (NAMENDA XR) 28 MG CP24 24 hr capsule   . metoCLOPramide (REGLAN) 10 MG tablet   . predniSONE (STERAPRED UNI-PAK 21 TAB) 10 MG (21) TBPK tablet   . DULoxetine (CYMBALTA) 60 MG capsule     CAN NOT DO FOR BCBS REGULAR OR  MEDICARE Over 40 minutes of exam, counseling, chart review, and complex, high/moderate level critical decision making was performed this visit.   Future Appointments  Date Time Provider [place]  11/27/2019  9:45 AM Burtis Junes, NP [facility]  12/16/2019  8:20 AM [facility] MR 2 [facility] [address] [facility]  01/21/2020  2:00 PM Liane Comber, NP [facility] None  10/19/2020 11:00 AM [name], MD [facility] None     HPI 68 y.o.female with history of has Hyperlipidemia associated with type 2 diabetes mellitus ([place]);Type 2 diabetes mellitus with stage 3a chronic kidney disease, without long-term current use of insulin ([place]); Depression, major, recurrent, in partial remission ([place]); SDAT ; Vitamin D deficiency; Essential hypertension; Medication management; Generalized anxiety disorder; Body mass index (BMI) of 24.0-24.9 in adult; CKD stage 3 due to type 2 diabetes mellitus ([place]); Malignant neoplasm of upper-outer quadrant of right breast in female, estrogen receptor positive ([place]); Vascular dementia without behavioral disturbance [facility]); NSTEMI (non-ST elevated myocardial infarction) ([place]); Mixed hyperlipidemia; presents for follow up for transition from recent hospitalization or SNIF stay. Admit date to the hospital was 11/04/19, patient was discharged from the hospital on 11/06/19 and our clinical staff contacted the office the day after  discharge to set up a follow up appointment. The discharge summary, medications, and diagnostic test results were reviewed before meeting with the patient. The patient was admitted for:  NSTEMI  Patient presented to ER with chest pain and elevated troponin, had negative CTA for PE, CXR normal, EKG did not show any ischemia. She had LHC on 11/05/2019 that showed mild non obstructive plaque and will be management medically.   She is on bASA, crestor 40 mg last LDL is 87, goal less than 70, she has preDM, and has NTG at home. She does not smoke.    She has history of dementia and her husband is here with her. She has been having worsening urinary frequency, nocturia 4-5 x at night and new incontinence since her hospital visit.     Her husband has been seeing a big change in her. States she could not figure how to get in the [place]. She is losing focus.  She is on Namenda but has not been on it for 30 days, has been on OTC that is not helping, does not see neuro, follows with Dr. Toy Care for depression/anxiety.   She is on trazodone 50 mg for sleep AS NEEDED- will dream Xanax 1 mg no longer on the medication- will keep on list as needed Cymbalta 60 mg NOT on daily- not really taking She is on tamoxifen daily Last CT head WO contrast was 12/06/2013 MRI brain 04/2013 for diplopia and ataxia- Minimal chronic white matter changes are stable.  Has seen Truitt Merle NP in 2015. RPR was negative, ammonia negative 2013.   Lab Results  Component Value Date   HGBA1C 6.1 (H) 10/29/2018   Lab Results  Component Value Date   CHOL 164 11/05/2019   HDL 50 11/05/2019   LDLCALC 87 11/05/2019   TRIG 133 11/05/2019   CHOLHDL 3.3 11/05/2019    Home health is not involved.   Images while in the hospital: DG Chest 2 View  Result Date: 11/04/2019 CLINICAL DATA:  Altered mental status.  Left chest pain. EXAM: CHEST - 2 VIEW COMPARISON:  Single-view of the chest 01/20/2019. FINDINGS: The lungs are clear. Heart size is normal. No pneumothorax or pleural effusion. Two mild compression fracture deformities of the thoracic spine at approximately T6 and T7 cannot be definitively characterized but appear remote. Intramedullary nail in the proximal right humerus is noted. Surgical clips right axilla are seen. IMPRESSION: No acute disease. Electronically Signed   By: Inge Rise M.D.   On: 11/04/2019 14:28   CT Angio Chest PE W and/or Wo Contrast  Result Date: 11/04/2019 CLINICAL DATA:  Shortness of breath EXAM: CT ANGIOGRAPHY CHEST WITH CONTRAST  TECHNIQUE: Multidetector CT imaging of the chest was performed using the standard protocol during bolus administration of intravenous contrast. Multiplanar CT image reconstructions and MIPs were obtained to evaluate the vascular anatomy. CONTRAST:  67m OMNIPAQUE IOHEXOL 350 MG/ML SOLN COMPARISON:  Chest x-ray from earlier in the same day. FINDINGS: Cardiovascular: Atherosclerotic calcifications of the thoracic aorta are noted without aneurysmal dilatation. Dissection cannot be evaluated due to timing of contrast bolus. No cardiac enlargement is seen. Coronary calcifications are noted. The pulmonary artery shows a normal branching pattern. No pulmonary emboli are identified. Mediastinum/Nodes: Thoracic inlet is within normal limits. No sizable hilar or mediastinal adenopathy is noted. The esophagus as visualized is within normal limits. Lungs/Pleura: Lungs are well aerated bilaterally. No focal infiltrate or sizable effusion is seen. Some minimal subpleural scarring is noted in the anterior aspect  of the right upper and right likely related to prior radiation therapy. Upper Abdomen: Upper pole right renal cyst is noted measuring approximately 3.6 cm. No other focal abnormality is noted in the upper abdomen. Musculoskeletal: Postsurgical changes are noted in the right breast consistent with the given clinical history. Degenerative changes of the thoracic spine are seen. T6 and T7 compression deformities are noted similar to that seen on the prior plain film examination. No rib abnormality is noted. Review of the MIP images confirms the above findings. IMPRESSION: No evidence of pulmonary emboli. Postsurgical and post radiation changes in the right breast and right lung as described. Right renal cyst. Chronic appearing compression deformities in the thoracic spine. Aortic Atherosclerosis (ICD10-I70.0). Electronically Signed   By: Inez Catalina M.D.   On: 11/04/2019 19:19   CARDIAC CATHETERIZATION  Result Date:  11/05/2019  Mid RCA lesion is 30% stenosed.  Prox RCA lesion is 20% stenosed.  Prox LAD to Mid LAD lesion is 20% stenosed.  The left ventricular systolic function is normal.  LV end diastolic pressure is normal.  The left ventricular ejection fraction is greater than 65% by visual estimate.  There is no mitral valve regurgitation.  1. Mild non-obstructive CAD 2. Normal LV systolic function 3. Mild elevation troponin with unclear etiology No further ischemic workup. Medical management of CAD      Current Outpatient Medications (Cardiovascular):  .  nitroGLYCERIN (NITROSTAT) 0.4 MG SL tablet, Place 1 tablet (0.4 mg total) under the tongue every 5 (five) minutes x 3 doses as needed for chest pain. .  rosuvastatin (CRESTOR) 20 MG tablet, Take one tablet daily for cholesterol (Patient taking differently: Take 20 mg by mouth daily. )  Current Outpatient Medications (Respiratory):  .  cetirizine (ZYRTEC) 10 MG tablet, Take 10 mg by mouth as needed for allergies.  .  diphenhydrAMINE (BENADRYL) 25 MG tablet, Take 25 mg by mouth as needed for itching or allergies.   Current Outpatient Medications (Analgesics):  .  aspirin EC 81 MG EC tablet, Take 1 tablet (81 mg total) by mouth daily.   Current Outpatient Medications (Other):  Marland Kitchen  ALPRAZolam (XANAX) 1 MG tablet, Take 1 mg by mouth daily as needed for anxiety. .  Blood Glucose Monitoring Suppl (ONETOUCH VERIO) w/Device KIT, 1 kit by Does not apply route as needed. Use to check glucose QD DX-E11.22 (Patient taking differently: 1 kit by Does not apply route See admin instructions. Use to check glucose every day DX-E11.22) .  cholecalciferol (VITAMIN D) 1000 units tablet, Take 10,000 Units by mouth daily. .  clobetasol ointment (TEMOVATE) 6.38 %, Apply 1 application topically 2 (two) times daily. .  DUOBRII 0.01-0.045 % LOTN, Apply 1 application topically daily. Marland Kitchen  glucose blood test strip, CHECK BLOOD SUGAR 1 TIME DAILY-DX e11.22 (Patient taking  differently: 1 each by Other route See admin instructions. CHECK BLOOD SUGAR 1 TIME DAILY-DX e11.22) .  Multiple Vitamin (MULTIVITAMIN) tablet, Take 1 tablet by mouth daily. .  ondansetron (ZOFRAN) 8 MG tablet, Take 1/2 to 1 tablet 2 to 3 x / day if needed for nausea / vomiting .  ONE TOUCH LANCETS MISC, Use as directed to check glucose daily. GT-X64.68 (Patient taking differently: 1 each by Other route See admin instructions. Use as directed to check glucose daily. DX-E11.22) .  tamoxifen (NOLVADEX) 20 MG tablet, Take 20 mg by mouth daily. .  traZODone (DESYREL) 50 MG tablet, Take 50 mg by mouth as needed for sleep.  Marland Kitchen  triamcinolone  ointment (KENALOG) 0.1 %, Apply 1 application topically 2 (two) times daily. Apply sparingly to Rash of legs 2 x /day & cover with Saran Wrap .  donepezil (ARICEPT) 10 MG tablet, Take 1 tablet (10 mg total) by mouth at bedtime. .  sulfamethoxazole-trimethoprim (BACTRIM DS) 800-160 MG tablet, Take 1 tablet by mouth 2 (two) times daily.  Past Medical History:  Diagnosis Date  . Dementia ([place])   . Depression   . Diabetes mellitus   . Hypertension   . Thyroid disease   . Type II or unspecified type diabetes mellitus without mention of complication, not stated as uncontrolled    stopped metformin due to diarrhea, no meds now  . Vitamin D deficiency      Allergies  Allergen Reactions  . Penicillins Rash    Swelling in face - last 10 years    ROS: all negative except above.   Physical Exam: Filed Weights   11/19/19 1513  Weight: 137 lb (62.1 kg)   BP 124/72   Pulse 63   Temp (!) 97.5 F (36.4 C)   Ht 5' 4.5" (1.638 m)   Wt 137 lb (62.1 kg)   SpO2 98%   BMI 23.15 kg/m  eneral appearance: alert, no distress, WD/WN, female HEENT: normocephalic, sclerae anicteric, TMs pearly, nares patent, no discharge or erythema, pharynx normal Oral cavity: MMM, no lesions Neck: supple, no lymphadenopathy, no thyromegaly, no masses Heart: RRR, normal S1, S2, no  murmurs Lungs: CTA bilaterally, no wheezes, rhonchi, or rales Abdomen: +bs, soft, non tender, non distended, no masses, no hepatomegaly, no splenomegaly Musculoskeletal: nontender, no swelling, no obvious deformity Extremities: no edema, no cyanosis, no clubbing Pulses: 2+ symmetric, upper and lower extremities, normal cap refill Neurological: alert, oriented x 3, CN2-12 intact, strength normal upper extremities and lower extremities, sensation normal throughout, DTRs 2+ throughout, Mental status: Alert, oriented, thought content appropriate, thought content exhibits loose associations Cranial nerves:  I: smell Not tested     II: visual fields Full to confrontation  II: pupils Equal, round, reactive to light  III,VII: ptosis None  III,IV,VI: extraocular muscles  Abnormal right gaze/nystagmus  V: mastication Normal  V: facial light touch sensation  Normal  V,VII: corneal reflex  Present  VII: facial muscle function - upper  Normal  VII: facial muscle function - lower Normal  VIII: hearing decreased  IX: soft palate elevation  Normal  IX,X: gag reflex Present  XI: trapezius strength  5/5  XI: sternocleidomastoid strength 5/5  XI: neck flexion strength  5/5  XII: tongue strength  Normal   Motor: grossly normal Coordination: finger to nose abnormal on the right dysarthria, disoriented, nystagmus, gait unsteady Psychiatric: flat affect, behavior normal, pleasant  Vicie Mutters, PA-C 2:01 PM [facility]

## 2019-11-19 ENCOUNTER — Ambulatory Visit: Payer: Medicare PPO | Admitting: Physician Assistant

## 2019-11-19 ENCOUNTER — Encounter: Payer: Self-pay | Admitting: Physician Assistant

## 2019-11-19 ENCOUNTER — Other Ambulatory Visit: Payer: Self-pay

## 2019-11-19 ENCOUNTER — Ambulatory Visit: Payer: Medicare Other | Admitting: Adult Health

## 2019-11-19 VITALS — BP 124/72 | HR 63 | Temp 97.5°F | Ht 64.5 in | Wt 137.0 lb

## 2019-11-19 DIAGNOSIS — I214 Non-ST elevation (NSTEMI) myocardial infarction: Secondary | ICD-10-CM | POA: Diagnosis not present

## 2019-11-19 DIAGNOSIS — G301 Alzheimer's disease with late onset: Secondary | ICD-10-CM

## 2019-11-19 DIAGNOSIS — G9340 Encephalopathy, unspecified: Secondary | ICD-10-CM

## 2019-11-19 DIAGNOSIS — F0280 Dementia in other diseases classified elsewhere without behavioral disturbance: Secondary | ICD-10-CM | POA: Diagnosis not present

## 2019-11-19 DIAGNOSIS — E538 Deficiency of other specified B group vitamins: Secondary | ICD-10-CM

## 2019-11-19 DIAGNOSIS — R29818 Other symptoms and signs involving the nervous system: Secondary | ICD-10-CM | POA: Diagnosis not present

## 2019-11-19 DIAGNOSIS — R471 Dysarthria and anarthria: Secondary | ICD-10-CM | POA: Diagnosis not present

## 2019-11-19 DIAGNOSIS — E611 Iron deficiency: Secondary | ICD-10-CM | POA: Diagnosis not present

## 2019-11-19 DIAGNOSIS — R32 Unspecified urinary incontinence: Secondary | ICD-10-CM | POA: Diagnosis not present

## 2019-11-19 MED ORDER — SULFAMETHOXAZOLE-TRIMETHOPRIM 800-160 MG PO TABS: 1.0000 | ORAL_TABLET | Freq: Two times a day (BID) | ORAL | 0 refills | Status: DC

## 2019-11-19 MED ORDER — DONEPEZIL HCL 10 MG PO TABS: 10.0000 mg | ORAL_TABLET | Freq: Every day | ORAL | 2 refills | Status: DC

## 2019-11-19 NOTE — Patient Instructions (Signed)
Will get MRI brain  Start on medication for possible UTI Don't start the aricept until after the antibiotic.   We will start donepezil (Aricept) 5mg  daily for four weeks.  If you are tolerating the medication, then after four weeks, we will increase the dose to 10mg  daily.  Side effects include nausea, vomiting, diarrhea, vivid dreams, and muscle cramps.  Please call the clinic if you experience any of these symptoms.  Will get labs  Will start on zetia NEXT OV, goal of cholesterol is 70  Your LDL is not in range or at goal, goal is less than 70.  Your LDL is the bad cholesterol that can lead to heart attack and stroke. To lower your number you can decrease your fatty foods, red meat, cheese, milk and increase fiber like whole grains and veggies. You can also add a fiber supplement like Citracel or Benefiber, these do not cause gas and bloating and are safe to use. Since you have risk factors that make [place] want your number below 70, we need to adjust or add medications to get your number below goal.   The patient was advised to call immediately if she has any concerning symptoms in the interval. The patient voices understanding of current treatment options and is in agreement with the current care plan.The patient knows to call the clinic with any problems, questions or concerns or go to the ER if any further progression of symptoms.    Dementia Dementia is a condition that affects the way the brain functions. It often affects memory and thinking. Usually, dementia gets worse with time and cannot be reversed (progressive dementia). There are many types of dementia, including:  Alzheimer's disease. This type is the most common.  Vascular dementia. This type may happen as the result of a stroke.  Lewy body dementia. This type may happen to people who have Parkinson's disease.  Frontotemporal dementia. This type is caused by damage to nerve cells (neurons) in certain parts of the brain. Some  people may be affected by more than one type of dementia. This is called mixed dementia. What are the causes? Dementia is caused by damage to cells in the brain. The area of the brain and the types of cells damaged determine the type of dementia. Usually, this damage is irreversible or cannot be undone. Some examples of irreversible causes include:  Conditions that affect the blood vessels of the brain, such as diabetes, heart disease, or blood vessel disease.  Genetic mutations. In some cases, changes in the brain may be caused by another condition and can be reversed or slowed. Some examples of reversible causes include:  Injury to the brain.  Certain medicines.  Infection, such as meningitis.  Metabolic problems, such as vitamin B12 deficiency or thyroid disease.  Pressure on the brain, such as from a tumor or blood clot. What are the signs or symptoms? Symptoms of dementia depend on the type of dementia. Common signs of dementia include problems with remembering, thinking, problem solving, decision making, and communicating. These signs develop slowly or get worse with time. This may include:  Problems remembering things.  Having trouble taking a bath or putting clothes on.  Forgetting appointments.  Forgetting to pay bills.  Difficulty planning and preparing meals.  Having trouble speaking.  Getting lost easily. How is this diagnosed? This condition is diagnosed by a specialist (neurologist). It is diagnosed based on the history of your symptoms, your medical history, a physical exam, and tests. Tests  may include:  Tests to evaluate brain function, such as memory tests, cognitive tests, and other tests.  Lab tests, such as blood or urine tests.  Imaging tests, such as a CT scan, a PET scan, or an MRI.  Genetic testing. This may be done if other family members have a diagnosis of certain types of dementia. Your health care provider will talk with you and your family,  friends, or caregivers about your history and symptoms. How is this treated?  Treatment for this condition depends on the cause of the dementia. Progressive dementias, such as Alzheimer's disease, cannot be cured, but there may be treatments that help to manage symptoms. Treatment might involve taking medicines that may help to:  Control the dementia.  Slow down the progression of the dementia.  Manage symptoms. In some cases, treating the cause of your dementia can improve symptoms, reverse symptoms, or slow down how quickly your dementia becomes worse. Your health care provider can direct you to support groups, organizations, and other health care providers who can help with decisions about your care. Follow these instructions at home: Medicines  Take over-the-counter and prescription medicines only as told by your health care provider.  Use a pill organizer or pill reminder to help you manage your medicines.  Avoid taking medicines that can affect thinking, such as pain medicines or sleeping medicines. Lifestyle  Make healthy lifestyle choices. ? Be physically active as told by your health care provider. ? Do not use any products that contain nicotine or tobacco, such as cigarettes, e-cigarettes, and chewing tobacco. If you need help quitting, ask your health care provider. ? Do not drink alcohol. ? Practice stress-management techniques when you get stressed. ? Spend time with other people.  Make sure to get quality sleep. These tips can help you get a good night's rest: ? Avoid napping during the day. ? Keep your sleeping area dark and cool. ? Avoid exercising during the few hours before you go to bed. ? Avoid caffeine products in the evening. Eating and drinking  Drink enough fluid to keep your urine pale yellow.  Eat a healthy diet. General instructions   Work with your health care provider to determine what you need help with and what your safety needs are.  Talk  with your health care provider about whether it is safe for you to drive.  If you were given a bracelet that identifies you as a person with memory loss or tracks your location, make sure to wear it at all times.  Work with your family to make important decisions, such as advance directives, medical power of attorney, or a living will.  Keep all follow-up visits as told by your health care provider. This is important. Where to find more information  Alzheimer's Association: CapitalMile.co.nz  National Institute on Aging: DVDEnthusiasts.nl  World Health Organization: RoleLink.com.br Contact a health care provider if:  You have any new or worsening symptoms.  You have problems with choking or swallowing. Get help right away if:  You feel depressed or sad, or feel that you want to harm yourself.  Your family members become concerned for your safety. If you ever feel like you may hurt yourself or others, or have thoughts about taking your own life, get help right away. You can go to your nearest emergency department or call:  Your local emergency services (911 in the [place]  A suicide crisis helpline, such as the [place] at (509) 046-0496. This is open 24  hours a day. Summary  Dementia is a condition that affects the way the brain functions. Dementia often affects memory and thinking.  Usually, dementia gets worse with time and cannot be reversed (progressive dementia).  Treatment for this condition depends on the cause of the dementia.  Work with your health care provider to determine what you need help with and what your safety needs are.  Your health care provider can direct you to support groups, organizations, and other health care providers who can help with decisions about your care. This information is not intended to replace advice given to you by your health care provider. Make sure you discuss any questions you have with your health care  provider. Document Revised: 08/28/2018 Document Reviewed: 08/28/2018 Elsevier Patient Education  [place].

## 2019-11-20 NOTE — Progress Notes (Deleted)
CARDIOLOGY OFFICE NOTE  Date:  11/20/2019    Marie [name] Date of Birth: May 27, 1951 Medical Record [id number]  PCP:  [name], MD  Cardiologist:  Servando Snare & ***    No chief complaint on file.   History of Present Illness: Marie [name] is a 69 y.o. female who presents today for a *** Active Problems:   Hyperlipidemia associated with type 2 diabetes mellitus ([place])   Essential hypertension   Vascular dementia without behavioral disturbance (HCC)   NSTEMI (non-ST elevated myocardial infarction) ([place])   Chest pain of uncertain etiology   CAD (coronary artery disease)  Diagnostic Studies/Procedures    LHC 11/05/19:    Mid RCA lesion is 30% stenosed.  Prox RCA lesion is 20% stenosed.  Prox LAD to Mid LAD lesion is 20% stenosed.  The left ventricular systolic function is normal.  LV end diastolic pressure is normal.  The left ventricular ejection fraction is greater than 65% by visual estimate.  There is no mitral valve regurgitation.  1. Mild non-obstructive CAD 2. Normal LV systolic function 3. Mild elevation troponin with unclear etiology  No further ischemic workup. Medical management of CAD  History of Present Illness     Marie [name] is a 69 y.o. female with a history ofrightbreast CA, diet-controlled DM 2, and dementiawho presented to the ED on 11/04/2019 with left lower chest/breast pain and shortness of breathfound to have an elevated troponin.  Hospital Course     [name] a 69 year old female with a history stated above who presented to [facility] on 11/04/2019 with complaints ofleft chest/breast pain and shortness of breath.HPI obtained from husband who was at bedside secondary to dementia. Husband reported that around 63 yesterday morning his wife reported left side/chest pain reported as a nagging, constant feeling. He stated that she initially thought she slept on her side wrong and therefore went on about her day.  Later on in the afternoon, she continued to have complaints. Husband then reported that she was woken with gasping breathlessness. He asked her to turn onto her right side and she eventually went back to sleep. Symptoms were still present upon waking today therefore they came to the ED for further evaluation.  He reports that she has no history of CAD. No history of tobacco or alcohol use. Has baseline dementia and is followed by her PCP for care.Husband is her primary caregiver at this time.   In the ED, HST found to be elevated at 157 with a repeat of 455. CTA withatherosclerotic calcifications of the thoracic aorta without aneurysmal dilatationalong with coronary calcificationsand nopulmonary emboli are identified.Creatinine 1.14. CXR with no acute cardiopulmonary disease. EKG with sinus tachycardia with HR 112bpm and no acute ischemic changes.  Plan was for [facility] for further ischemic evaluation.  This was performed 11/05/2019 which showed mild nonobstructive CAD with normal LV systolic function with no further ischemic work-up recommended and medical management of CAD.  Unclear etiology of elevated troponin. CTA performed with no pulmonary embolus.  Plan is for ASA and statin therapies.  Consultants: None  The patient was seen and examined by Dr. Stanford Breed he feels that she is stable and ready for discharge today, 11/06/2019.  Cath site stable.  Did the patient have an acute coronary syndrome (MI, NSTEMI, STEMI, etc) this admission?:  No.   The elevated Troponin was due to the acute medical illness (demand ischemia).   The patient {does/does not:200015} have symptoms concerning for COVID-19 infection (fever, chills, cough,  or new shortness of breath).   Comes in today. Here with   Past Medical History:  Diagnosis Date  . Dementia ([place])   . Depression   . Diabetes mellitus   . Hypertension   . Thyroid disease   . Type II or unspecified type diabetes mellitus without  mention of complication, not stated as uncontrolled    stopped metformin due to diarrhea, no meds now  . Vitamin D deficiency     Past Surgical History:  Procedure Laterality Date  . BREAST LUMPECTOMY WITH RADIOACTIVE SEED AND SENTINEL LYMPH NODE BIOPSY Right 03/08/2018   Procedure: BREAST LUMPECTOMY WITH RADIOACTIVE SEED AND SENTINEL LYMPH NODE BIOPSY;  Surgeon: [name], MD;  Location: [place];  Service: General;  Laterality: Right;  . CARDIAC CATHETERIZATION  11/05/2019  . FRACTURE SURGERY  2009   right arm; rod  . LEFT HEART CATH AND CORONARY ANGIOGRAPHY N/A 11/05/2019   Procedure: LEFT HEART CATH AND CORONARY ANGIOGRAPHY;  Surgeon: [name], MD;  Location: [place];  Service: Cardiovascular;  Laterality: N/A;  . TOTAL KNEE ARTHROPLASTY  june 2013   left     Medications: No outpatient medications have been marked as taking for the 11/27/19 encounter (Appointment) with Burtis Junes, NP.     Allergies: Allergies  Allergen Reactions  . Penicillins Rash    Swelling in face - last 10 years    Social History: The patient  reports that she has never smoked. She has never used smokeless tobacco. She reports that she does not drink alcohol or use drugs.   Family History: The patient's ***family history includes Breast cancer in her mother; Dementia in her father; Emphysema in her mother; Kidney failure in her father.   Review of Systems: Please see the history of present illness.   All other systems are reviewed and negative.   Physical Exam: VS:  There were no vitals taken for this visit. Marland Kitchen  BMI There is no height or weight on file to calculate BMI.  Wt Readings from Last 3 Encounters:  11/19/19 137 lb (62.1 kg)  11/05/19 154 lb 5.2 oz (70 kg)  06/25/19 153 lb 3.2 oz (69.5 kg)    General: Pleasant. Well developed, well nourished and in no acute distress.   HEENT: Normal.  Neck: Supple, no JVD, carotid bruits, or masses  noted.  Cardiac: ***Regular rate and rhythm. No murmurs, rubs, or gallops. No edema.  Respiratory:  Lungs are clear to auscultation bilaterally with normal work of breathing.  GI: Soft and nontender.  MS: No deformity or atrophy. Gait and ROM intact.  Skin: Warm and dry. Color is normal.  Neuro:  Strength and sensation are intact and no gross focal deficits noted.  Psych: Alert, appropriate and with normal affect.   LABORATORY DATA:  EKG:  EKG {ACTION; IS/IS [id number] ordered today.  Personally reviewed by me. This demonstrates ***.  Lab Results  Component Value Date   WBC 7.9 11/19/2019   HGB 12.9 11/19/2019   HCT 37.5 11/19/2019   PLT 291 11/19/2019   GLUCOSE 121 (H) 11/19/2019   CHOL 164 11/05/2019   TRIG 133 11/05/2019   HDL 50 11/05/2019   LDLCALC 87 11/05/2019   ALT 10 11/19/2019   AST 15 11/19/2019   NA 139 11/19/2019   K 3.9 11/19/2019   CL 104 11/19/2019   CREATININE 1.28 (H) 11/19/2019   BUN 16 11/19/2019   CO2 26 11/19/2019   TSH 3.83  12/12/2018   INR 0.93 12/03/2010   HGBA1C 6.1 (H) 10/29/2018   MICROALBUR 1.2 07/25/2018     BNP (last 3 results) No results for input(s): BNP in the last 8760 hours.  ProBNP (last 3 results) No results for input(s): PROBNP in the last 8760 hours.   Other Studies Reviewed Today:   Assessment/Plan:  . COVID-19 Education: The signs and symptoms of COVID-19 were discussed with the patient and how to seek care for testing (follow up with PCP or arrange E-visit).  The importance of social distancing, staying at home, hand hygiene and wearing a mask when out in public were discussed today.  Current medicines are reviewed with the patient today.  The patient does not have concerns regarding medicines other than what has been noted above.  The following changes have been made:  See above.  Labs/ tests ordered today include:   No orders of the defined types were placed in this encounter.    Disposition:   FU with  *** in {gen number [id number] {Days to years:10300}.   Patient is agreeable to this plan and will call if any problems develop in the interim.   SignedTruitt Merle, NP  11/20/2019 7:45 AM  [facility] [address] [place], [place]  [address] Phone: [phone] Fax: [phone]

## 2019-11-21 LAB — URINALYSIS, ROUTINE W REFLEX MICROSCOPIC
Bilirubin Urine: NEGATIVE
Glucose, UA: NEGATIVE
Hyaline Cast: NONE SEEN /LPF
Nitrite: POSITIVE — AB
Specific Gravity, Urine: 1.016 (ref 1.001–1.03)
pH: 5.5 (ref 5.0–8.0)

## 2019-11-21 LAB — CBC WITH DIFFERENTIAL/PLATELET
Absolute Monocytes: 521 cells/uL (ref 200–950)
Basophils Absolute: 71 cells/uL (ref 0–200)
Basophils Relative: 0.9 %
Eosinophils Absolute: 111 cells/uL (ref 15–500)
Eosinophils Relative: 1.4 %
HCT: 37.5 % (ref 35.0–45.0)
Hemoglobin: 12.9 g/dL (ref 11.7–15.5)
Lymphs Abs: 980 cells/uL (ref 850–3900)
MCH: 31.5 pg (ref 27.0–33.0)
MCHC: 34.4 g/dL (ref 32.0–36.0)
MCV: 91.5 fL (ref 80.0–100.0)
MPV: 9 fL (ref 7.5–12.5)
Monocytes Relative: 6.6 %
Neutro Abs: 6217 cells/uL (ref 1500–7800)
Neutrophils Relative %: 78.7 %
Platelets: 291 10*3/uL (ref 140–400)
RBC: 4.1 10*6/uL (ref 3.80–5.10)
RDW: 12 % (ref 11.0–15.0)
Total Lymphocyte: 12.4 %
WBC: 7.9 10*3/uL (ref 3.8–10.8)

## 2019-11-21 LAB — COMPLETE METABOLIC PANEL WITH GFR
AG Ratio: 1.5 (calc) (ref 1.0–2.5)
ALT: 10 U/L (ref 6–29)
AST: 15 U/L (ref 10–35)
Albumin: 3.8 g/dL (ref 3.6–5.1)
Alkaline phosphatase (APISO): 56 U/L (ref 37–153)
BUN/Creatinine Ratio: 13 (calc) (ref 6–22)
BUN: 16 mg/dL (ref 7–25)
CO2: 26 mmol/L (ref 20–32)
Calcium: 9 mg/dL (ref 8.6–10.4)
Chloride: 104 mmol/L (ref 98–110)
Creat: 1.28 mg/dL — ABNORMAL HIGH (ref 0.50–0.99)
GFR, Est African American: 50 mL/min/{1.73_m2} — ABNORMAL LOW (ref >=60)
GFR, Est Non African American: 43 mL/min/{1.73_m2} — ABNORMAL LOW (ref >=60)
Globulin: 2.6 g/dL (calc) (ref 1.9–3.7)
Glucose, Bld: 121 mg/dL — ABNORMAL HIGH (ref 65–99)
Potassium: 3.9 mmol/L (ref 3.5–5.3)
Sodium: 139 mmol/L (ref 135–146)
Total Bilirubin: 0.6 mg/dL (ref 0.2–1.2)
Total Protein: 6.4 g/dL (ref 6.1–8.1)

## 2019-11-21 LAB — VITAMIN B12: Vitamin B-12: 345 pg/mL (ref 200–1100)

## 2019-11-21 LAB — URINE CULTURE
MICRO NUMBER:: 10518314
SPECIMEN QUALITY:: ADEQUATE

## 2019-11-21 LAB — ANA: Anti Nuclear Antibody (ANA): POSITIVE — AB

## 2019-11-21 LAB — ANTI-NUCLEAR AB-TITER (ANA TITER): ANA Titer 1: 1:40 {titer} — ABNORMAL HIGH

## 2019-11-21 LAB — ANTI-DNA ANTIBODY, DOUBLE-STRANDED: ds DNA Ab: <1 IU/mL

## 2019-11-21 LAB — IRON: Iron: 44 ug/dL — ABNORMAL LOW (ref 45–160)

## 2019-11-21 LAB — SEDIMENTATION RATE: Sed Rate: 33 mm/h — ABNORMAL HIGH (ref 0–30)

## 2019-11-21 LAB — FERRITIN: Ferritin: 71 ng/mL (ref 16–288)

## 2019-11-21 LAB — AMMONIA: Ammonia: 23 umol/L (ref <=72)

## 2019-11-27 ENCOUNTER — Ambulatory Visit: Payer: Medicare PPO | Admitting: Nurse Practitioner

## 2019-11-28 DIAGNOSIS — L281 Prurigo nodularis: Secondary | ICD-10-CM | POA: Diagnosis not present

## 2019-11-28 DIAGNOSIS — L244 Irritant contact dermatitis due to drugs in contact with skin: Secondary | ICD-10-CM | POA: Diagnosis not present

## 2019-11-28 DIAGNOSIS — D485 Neoplasm of uncertain behavior of skin: Secondary | ICD-10-CM | POA: Diagnosis not present

## 2019-11-28 DIAGNOSIS — L280 Lichen simplex chronicus: Secondary | ICD-10-CM | POA: Diagnosis not present

## 2019-12-10 ENCOUNTER — Telehealth: Payer: Self-pay | Admitting: Physician Assistant

## 2019-12-10 ENCOUNTER — Telehealth: Payer: Self-pay | Admitting: *Deleted

## 2019-12-10 ENCOUNTER — Telehealth: Payer: Self-pay | Admitting: Internal Medicine

## 2019-12-10 ENCOUNTER — Other Ambulatory Visit: Payer: Self-pay | Admitting: *Deleted

## 2019-12-10 NOTE — Telephone Encounter (Signed)
Lvm, Per Vicie Mutters, please return call to office, need urine on patient to r/o renal complaints by family- frequency, dilutions. Can come in or drop off sample.

## 2019-12-10 NOTE — Telephone Encounter (Signed)
69 year old WF with history of DM2 with chol, CKD stage 3, depression, dementia, breast cancer history, recently seen on 05/25 for hospital follow up for NSTEMI.   She was treated for UTI with bactrim on 05/25 and family was concerned with worsening memory issues. Several medications were stopped including namenda and reglan and she was started on aricept. She was suppose to have an MRI but never got it.   Her daughter is calling stating that she is having worsening encephalopathy, does not know where she is, is feeling she needs to urinate every 5 mins but is not urinating well. Having incontinence.   Advised daughter, [name], to stop the aricept and take her mom to the ER for evaluation for worsening encephalopathy possibly due to UTI versus worsening dementia. Marland Kitchen

## 2019-12-10 NOTE — Telephone Encounter (Signed)
-----   Message from Elenor Quinones, [place] sent at 12/10/2019  9:21 AM EDT ----- Regarding: office note/phone call Contact: [phone] Crystal(daughter)  Collene Gobble her daughter reports that her mother is disoriented & has been incontinent. Patient's daughter would like a return call from the provider.   Please & thank you

## 2019-12-10 NOTE — Telephone Encounter (Signed)
Patient daughter called and left VM: patient's daughter called requesting more breast cancer pills for her mother. Her call back number is [phone]. Message sent to Dr. Virgie Dad desk nurse.

## 2019-12-16 ENCOUNTER — Other Ambulatory Visit: Payer: Medicare PPO

## 2019-12-17 NOTE — Progress Notes (Signed)
CARDIOLOGY OFFICE NOTE  Date:  12/24/2019    Marie [name] Date of Birth: 03-13-1951 Medical Record #[id number]  PCP:  [name], MD  Cardiologist:  Meda Coffee (NEW)  Chief Complaint  Patient presents with  . Follow-up    History of Present Illness: Marie [name] is a 69 y.o. female who presents today for a post hospital visit. Seen for Dr. Meda Coffee.   She has a history ofrightbreast CA, diet-controlled DM 2, and dementia. No known prior CAD. Presented last month to the ER with left lower chest/breast pain and shortness of breath- found to have an elevated troponin.  CTA withatherosclerotic calcifications of the thoracic aorta without aneurysmal dilatationalong with coronary calcificationsand nopulmonary emboli are identified.Cardiac cath was done which showed mild nonobstructive CAD with normal LV systolic function with no further ischemic work-up recommended and medical management of CAD. Plan is for ASA and statin therapies.  The patient does not have symptoms concerning for COVID-19 infection (fever, chills, cough, or new shortness of breath).   Comes in today. Here with her daughter. She says she is doing ok. Has more issues with trying to void - takes along time "to go". Asking lots of questions about her voiding - to defer to PCP. No chest pain noted. She is on aspirin and has been on statin. BP looks good. No problems with her cath site.   Past Medical History:  Diagnosis Date  . Dementia ([place])   . Depression   . Diabetes mellitus   . Hypertension   . Thyroid disease   . Type II or unspecified type diabetes mellitus without mention of complication, not stated as uncontrolled    stopped metformin due to diarrhea, no meds now  . Vitamin D deficiency     Past Surgical History:  Procedure Laterality Date  . BREAST LUMPECTOMY WITH RADIOACTIVE SEED AND SENTINEL LYMPH NODE BIOPSY Right 03/08/2018   Procedure: BREAST LUMPECTOMY WITH RADIOACTIVE SEED AND  SENTINEL LYMPH NODE BIOPSY;  Surgeon: [name], MD;  Location: [place];  Service: General;  Laterality: Right;  . CARDIAC CATHETERIZATION  11/05/2019  . FRACTURE SURGERY  2009   right arm; rod  . LEFT HEART CATH AND CORONARY ANGIOGRAPHY N/A 11/05/2019   Procedure: LEFT HEART CATH AND CORONARY ANGIOGRAPHY;  Surgeon: [name], MD;  Location: [facility];  Service: Cardiovascular;  Laterality: N/A;  . TOTAL KNEE ARTHROPLASTY  june 2013   left     Medications: Current Meds  Medication Sig  . aspirin EC 81 MG EC tablet Take 1 tablet (81 mg total) by mouth daily.  . Blood Glucose Monitoring Suppl (ONETOUCH VERIO) w/Device KIT 1 kit by Does not apply route as needed. Use to check glucose QD DX-E11.22  . cetirizine (ZYRTEC) 10 MG tablet Take 10 mg by mouth as needed for allergies.   . cholecalciferol (VITAMIN D) 1000 units tablet Take 10,000 Units by mouth daily.  . clobetasol ointment (TEMOVATE) 8.25 % Apply 1 application topically 2 (two) times daily.  . diphenhydrAMINE (BENADRYL) 25 MG tablet Take 25 mg by mouth as needed for itching or allergies.   Marland Kitchen donepezil (ARICEPT) 10 MG tablet   . glucose blood test strip CHECK BLOOD SUGAR 1 TIME DAILY-DX e11.22  . Multiple Vitamin (MULTIVITAMIN) tablet Take 1 tablet by mouth daily.  . nitroGLYCERIN (NITROSTAT) 0.4 MG SL tablet Place 1 tablet (0.4 mg total) under the tongue every 5 (five) minutes x 3 doses as  needed for chest pain.  Marland Kitchen ondansetron (ZOFRAN) 8 MG tablet Take 1/2 to 1 tablet 2 to 3 x / day if needed for nausea / vomiting  . ONE TOUCH LANCETS MISC Use as directed to check glucose daily. DX-E11.22  . rosuvastatin (CRESTOR) 20 MG tablet Take one tablet daily for cholesterol  . tamoxifen (NOLVADEX) 20 MG tablet Take 20 mg by mouth daily.  Marland Kitchen triamcinolone ointment (KENALOG) 0.1 % Apply 1 application topically 2 (two) times daily. Apply sparingly to Rash of legs 2 x /day & cover with Saran Wrap      Allergies: Allergies  Allergen Reactions  . Penicillins Rash    Swelling in face - last 10 years    Social History: The patient  reports that she has never smoked. She has never used smokeless tobacco. She reports that she does not drink alcohol and does not use drugs.   Family History: The patient's family history includes Breast cancer in her mother; Dementia in her father; Emphysema in her mother; Kidney failure in her father.   Review of Systems: Please see the history of present illness.   All other systems are reviewed and negative.   Physical Exam: VS:  BP 116/80   Pulse 82   Ht 5' 4.5" (1.638 m)   Wt 138 lb 9.6 oz (62.9 kg)   SpO2 98%   BMI 23.42 kg/m  .  BMI Body mass index is 23.42 kg/m.  Wt Readings from Last 3 Encounters:  12/24/19 138 lb 9.6 oz (62.9 kg)  11/19/19 137 lb (62.1 kg)  11/05/19 154 lb 5.2 oz (70 kg)    General: Alert and in no acute distress. She looks to her daughter for answers.   Cardiac: Regular rate and rhythm. No murmurs, rubs, or gallops. No edema.  Respiratory:  Lungs are clear to auscultation bilaterally with normal work of breathing.  GI: Soft and nontender.  MS: No deformity or atrophy. Gait and ROM intact.  Skin: Warm and dry. Color is normal.  Neuro:  Strength and sensation are intact and no gross focal deficits noted.  Psych: Alert, appropriate and with normal affect.   LABORATORY DATA:  EKG:  EKG is not ordered today.    Lab Results  Component Value Date   WBC 7.9 11/19/2019   HGB 12.9 11/19/2019   HCT 37.5 11/19/2019   PLT 291 11/19/2019   GLUCOSE 121 (H) 11/19/2019   CHOL 164 11/05/2019   TRIG 133 11/05/2019   HDL 50 11/05/2019   LDLCALC 87 11/05/2019   ALT 10 11/19/2019   AST 15 11/19/2019   NA 139 11/19/2019   K 3.9 11/19/2019   CL 104 11/19/2019   CREATININE 1.28 (H) 11/19/2019   BUN 16 11/19/2019   CO2 26 11/19/2019   TSH 3.83 12/12/2018   INR 0.93 12/03/2010   HGBA1C 6.1 (H) 10/29/2018    MICROALBUR 1.2 07/25/2018     BNP (last 3 results) No results for input(s): BNP in the last 8760 hours.  ProBNP (last 3 results) No results for input(s): PROBNP in the last 8760 hours.   Other Studies Reviewed Today:  LHC 11/05/19:    Mid RCA lesion is 30% stenosed.  Prox RCA lesion is 20% stenosed.  Prox LAD to Mid LAD lesion is 20% stenosed.  The left ventricular systolic function is normal.  LV end diastolic pressure is normal.  The left ventricular ejection fraction is greater than 65% by visual estimate.  There is no mitral valve  regurgitation.  1. Mild non-obstructive CAD 2. Normal LV systolic function 3. Mild elevation troponin with unclear etiology  No further ischemic workup. Medical management of CAD  Assessment/Plan:  1. Chest pain with elevated troponin - stable cath findings - to manage medically - no worrisome symptoms. No changes made today - would stay on statin and aspirin therapy.   2. Dementia   3. HTN - BP is fine on her current regimen. No changes made today.   4. DM - per PCP  5. Voiding issues - defer to PCP  Current medicines are reviewed with the patient today.  The patient does not have concerns regarding medicines other than what has been noted above.  The following changes have been made:  See above.  Labs/ tests ordered today include:   No orders of the defined types were placed in this encounter.    Disposition:   FU with Dr. Meda Coffee and her team in about 6 months.      Patient is agreeable to this plan and will call if any problems develop in the interim.   SignedTruitt Merle, NP  12/24/2019 10:24 AM  [facility] [address] [place], [place]  [address] Phone: [phone] Fax: [phone]

## 2019-12-24 ENCOUNTER — Other Ambulatory Visit: Payer: Self-pay

## 2019-12-24 ENCOUNTER — Ambulatory Visit: Payer: Medicare PPO | Admitting: Nurse Practitioner

## 2019-12-24 ENCOUNTER — Encounter: Payer: Self-pay | Admitting: Nurse Practitioner

## 2019-12-24 VITALS — BP 116/80 | HR 82 | Ht 64.5 in | Wt 138.6 lb

## 2019-12-24 DIAGNOSIS — I259 Chronic ischemic heart disease, unspecified: Secondary | ICD-10-CM | POA: Diagnosis not present

## 2019-12-24 DIAGNOSIS — E782 Mixed hyperlipidemia: Secondary | ICD-10-CM | POA: Diagnosis not present

## 2019-12-24 DIAGNOSIS — Z9889 Other specified postprocedural states: Secondary | ICD-10-CM

## 2019-12-24 NOTE — Patient Instructions (Addendum)
After Visit Summary:  We will be checking the following labs today - NONE   Medication Instructions:    Continue with your current medicines.    If you need a refill on your cardiac medications before your next appointment, please call your pharmacy.     Testing/Procedures To Be Arranged:  N/A  Follow-Up:   See Dr. Meda Coffee and her team in 6 months - You will receive a reminder letter in the mail two months in advance. If you don't receive a letter, please call our office to schedule the follow-up appointment.     At [facility], you and your health needs are our priority.  As part of our continuing mission to provide you with exceptional heart care, we have created designated Provider Care Teams.  These Care Teams include your primary Cardiologist (physician) and Advanced Practice Providers (APPs -  Physician Assistants and Nurse Practitioners) who all work together to provide you with the care you need, when you need it.  Special Instructions:  . Stay safe, wash your hands for at least 20 seconds and wear a mask when needed.  . It was good to talk with you today.    Call the [place] office at [phone] if you have any questions, problems or concerns.

## 2020-01-20 DIAGNOSIS — I700 Atherosclerosis of aorta: Secondary | ICD-10-CM | POA: Insufficient documentation

## 2020-01-20 NOTE — Progress Notes (Signed)
MEDICARE ANNUAL WELLNESS VISIT AND FOLLOW UP  Assessment:   Diagnoses and all orders for this visit:  Encounter for Medicare annual wellness exam  Atherosclerosis of aorta Per CTA 10/2019 Control blood pressure, cholesterol, glucose, increase exercise.   CAD/hx of NSTEMI Dr. Meda [name] following; nonobstructive on cath, recommended medical therapy On bASA, optimize statin for LDL goal <70 Denies angina   Essential hypertension  Currently at goal off of medications Monitor blood pressure at home; call if consistently over 130/80 Continue DASH diet.   Reminder to go to the ER if any CP, SOB, nausea, dizziness, severe HA, changes vision/speech, left arm numbness and tingling and jaw pain.  Non-seasonal allergic rhinitis, unspecified trigger Continue OTC allergy pills  Type 2 diabetes mellitus with stage 3 chronic kidney disease, without long-term current use of insulin (HCC) Currently off of metformin secondary to acute kidney injury; recent A1Cs improved in prediabetic range Education: Reviewed 'ABCs' of diabetes management (respective goals in parentheses):  A1C (<7), blood pressure (<130/80), and cholesterol (LDL <70) Eye Exam yearly and Dental Exam every 6 months - reminded to schedule diabetic eye exam  Dietary recommendations Physical Activity recommendations Reminded to check feet daily  CKD stage 3 due to type 2 diabetes mellitus (HCC) Increase fluids, avoid NSAIDS, monitor sugars, will monitor Check CMP/GFR Refer to nephrology if insufficient recovery from recent acute decline/injury -  Poorly adherent to recommendations for fluid intake  Dementia (HCC)/ Advancing dementia  Continue aricept Does fairly with ADLs, responds well to promps and reminders family members are around to help monitor at home However notable decline over the last year, will get lost in home Needs to be reminded to drink sufficient fluids; strategies discussed Last neuro evaluation remote;  advised follow up with Dr. Toy [name] and will refer back to Dr. Leta [name] after checking labs today as last follow up was remote  Vitamin D deficiency She is on vitamin D 5000 IU daily; titrate for goal of 60-100 Check vitamin D  Medication management CBC, CMP/GFR  Hyperlipidemia Titrate statin as tolerated, consider Continue low cholesterol diet and exercise.  Check lipid panel.   Generalized anxiety disorder Depression, controlled Managed by Dr. Toy [name], advised follow up  Body mass index (BMI) of 23.0-23.9 Continue to recommend diet heavy in fruits and veggies and low in animal meats, cheeses, and dairy products, appropriate calorie intake Discuss exercise recommendations routinely Continue to monitor weight at each visit  Estrogen sensitive R breast cancer (Marie [name]) On tamoxifen, followed by Marie [name] Overdue mammogram - reminded to schedule, has #   Osteopenia - get dexa 01/2020 with mammogram, continue Vit D and Ca, weight bearing exercises  Colon cancer screening  Colonoscopy- patient declines a colonoscopy even though the risks and benefits were discussed at length. Colon cancer is 3rd most diagnosed cancer and 2nd leading cause of death in both men and women 69 years of age and older. Patient understands the risk of cancer and death with declining the test however they are willing to do cologuard screening instead. They understand that this is not as sensitive or specific as a colonoscopy and they are still recommended to get a colonoscopy. The cologuard will be sent out to their house.    Orders Placed This Encounter  Procedures  . DG Bone Density  . CBC with Differential/Platelet  . COMPLETE METABOLIC PANEL WITH GFR  . Magnesium  . Lipid panel  . TSH  . Hemoglobin A1c  . VITAMIN D 25 Hydroxy (Vit-D Deficiency, Fractures)  .  Microalbumin / creatinine urine ratio  . Urinalysis w microscopic + reflex cultur  . Cologuard    Over 40 minutes of exam, counseling, chart  review and critical decision making was performed Future Appointments  Date Time Provider [place]  04/23/2020 11:00 AM Marie Comber, NP [facility] None  10/19/2020 11:00 AM Marie Pinto, MD [facility] None  01/27/2021  2:00 PM Marie Comber, NP [facility] None     Plan:   During the course of the visit the patient was educated and counseled about appropriate screening and preventive services including:    Pneumococcal vaccine   Prevnar 13  Influenza vaccine  Td vaccine  Screening electrocardiogram  Bone densitometry screening  Colorectal cancer screening  Diabetes screening  Glaucoma screening  Nutrition counseling   Advanced directives: requested   Subjective:  Marie [name] is a 69 y.o. female who presents for Medicare Annual Wellness Visit and 3 month follow up.   Patient has been on SS Disability since 2015 for Depression and is followed by Dr Marie [name] though reports not recently  She was evaluated by Dr Marie [name] in 2012 for her Dementia & she requires moderate supervision by her caretaker husband and her children; he drives, manages meds and finances, was able to complete ADLs but decline in the last year, needs reminders to bathe, will put on clothing inside out or backwards, occasionally can't find bathroom in home and will wet, or get lost going from house to car parked outdoors. No wandering.   She was on namenda, now on aricept prescribed through this office. Husband feels she is worse in the last year, can't find toilet is in her home or where car is parked. Wandering thought process, struggles to stay on topic.   She was diagnosed with R breast cancer in 2019, clinical T1b N0, stage IA, estrogen sensitive, s/p R lumpectomy and radiation followed by Marie [name] and on tamoxifen and doing well with good prognosis.   In May 2021 she presented to ED for L sided chest pain with elevated troponin.  CTA withatherosclerotic calcifications of the  thoracic aorta without aneurysmal dilatationalong with coronary calcifications, no PE.Cardiac cath was done which showed mild nonobstructive CAD with normal LV systolic function with no further ischemic work-up recommended and medical management of CAD, on bASA and crestor. Following with Dr. Meda [name].   BMI is Body mass index is 22.98 kg/m., she has been working on diet and exercise, avoiding white foods and no longer walking due to memory.  Wt Readings from Last 3 Encounters:  01/21/20 136 lb (61.7 kg)  12/24/19 138 lb 9.6 oz (62.9 kg)  11/19/19 137 lb (62.1 kg)    Her blood pressure has been controlled at home, today their BP is BP: (!) 138/74 She does workout. She denies chest pain, shortness of breath, dizziness.   She is on cholesterol medication (rosuvastatin 20 mg daily) and denies myalgias. Her cholesterol is not at goal of LDL <70.The cholesterol last visit was:   Lab Results  Component Value Date   CHOL 164 11/05/2019   HDL 50 11/05/2019   LDLCALC 87 11/05/2019   TRIG 133 11/05/2019   CHOLHDL 3.3 11/05/2019   . She has been working on diet for T2DM controlled by diet/lifestyle (formerly on metformin, was d/c'd after renal function decline with some improvement since then), and denies foot ulcerations, increased appetite, nausea, paresthesia of the feet, polydipsia, polyuria, visual disturbances, vomiting and weight loss.  Reports fasting sugars running 130s.  Last A1C  in the office was:  Lab Results  Component Value Date   HGBA1C 6.1 (H) 10/29/2018   She has CKD III associated with T2DM monitored at this office. Last GFR: Lab Results  Component Value Date   GFRNONAA 85 (L) 11/19/2019  She reports currently drinking around 16 fluid ounces x 2 Does drink sweet tea and soda, 3 cans/day   Patient is reportedly on vitamin D supplement but remains well below goal at recent check:  Lab Results  Component Value Date   VD25OH 38 10/29/2018      Medication Review: Current  Outpatient Medications on File Prior to Visit  Medication Sig Dispense Refill  . aspirin EC 81 MG EC tablet Take 1 tablet (81 mg total) by mouth daily.    . Blood Glucose Monitoring Suppl (ONETOUCH VERIO) w/Device KIT 1 kit by Does not apply route as needed. Use to check glucose QD DX-E11.22 1 kit 0  . cetirizine (ZYRTEC) 10 MG tablet Take 10 mg by mouth as needed for allergies.     . cholecalciferol (VITAMIN D) 1000 units tablet Take 10,000 Units by mouth daily.    . clobetasol ointment (TEMOVATE) 3.84 % Apply 1 application topically 2 (two) times daily. 30 g 0  . diphenhydrAMINE (BENADRYL) 25 MG tablet Take 25 mg by mouth as needed for itching or allergies.     Marland Kitchen donepezil (ARICEPT) 10 MG tablet     . glucose blood test strip CHECK BLOOD SUGAR 1 TIME DAILY-DX e11.22 100 each 12  . Multiple Vitamin (MULTIVITAMIN) tablet Take 1 tablet by mouth daily.    . nitroGLYCERIN (NITROSTAT) 0.4 MG SL tablet Place 1 tablet (0.4 mg total) under the tongue every 5 (five) minutes x 3 doses as needed for chest pain. 25 tablet 0  . ondansetron (ZOFRAN) 8 MG tablet Take 1/2 to 1 tablet 2 to 3 x / day if needed for nausea / vomiting 30 tablet 2  . ONE TOUCH LANCETS MISC Use as directed to check glucose daily. DX-E11.22 100 each 1  . rosuvastatin (CRESTOR) 20 MG tablet Take one tablet daily for cholesterol 90 tablet 1  . tamoxifen (NOLVADEX) 20 MG tablet Take 20 mg by mouth daily.    Marland Kitchen triamcinolone ointment (KENALOG) 0.1 % Apply 1 application topically 2 (two) times daily. Apply sparingly to Rash of legs 2 x /day & cover with Saran Wrap 453.6 g 1   No current facility-administered medications on file prior to visit.    Allergies  Allergen Reactions  . Penicillins Rash    Swelling in face - last 10 years    Current Problems (verified) Patient Active Problem List   Diagnosis Date Noted  . Aortic atherosclerosis ([place]) 01/20/2020  . CAD (coronary artery disease) 11/06/2019  . History of NSTEMI - 11/04/19  11/04/2019  . Vascular dementia without behavioral disturbance ([place]) 05/21/2019  . Malignant neoplasm of upper-outer quadrant of right breast in female, estrogen receptor positive ([place]) 02/13/2018  . CKD stage 3 due to type 2 diabetes mellitus ([place]) 10/04/2017  . Body mass index (BMI) of 24.0-24.9 in adult 05/05/2015  . Generalized anxiety disorder 08/04/2014  . Essential hypertension 04/28/2014  . Medication management 04/28/2014  . Vitamin D deficiency 10/04/2013  . Type 2 diabetes mellitus with stage 3a chronic kidney disease, without long-term current use of insulin ([place])   . Depression, major, recurrent, in partial remission ([place])   . SDAT    . Hyperlipidemia associated with type 2 diabetes mellitus ([place]) 07/28/2007  .  Allergic rhinitis 07/28/2007    Screening Tests Immunization History  Administered Date(s) Administered  . DT (Pediatric) 11/25/2005, 08/11/2015  . Influenza, High Dose Seasonal PF 03/28/2017, 07/25/2018, 03/28/2019  . Pneumococcal Conjugate-13 08/04/2014  . Pneumococcal Polysaccharide-23 03/28/1999, 03/28/2017  . Zoster 04/28/2014   Preventative [name]: Last colonoscopy: 2013 due 03/2019 per Dr. Deatra Ina - normal, incomplete prep, NO POLYPS OR FAMILY HX - will do cologuard Mammogram: 01/2018 - R breast mass, was cancelled - husband will call back to reschedule Last pap smear/pelvic exam: 2013? Declines further DEXA:  02/01/2018 - L fem T -1.5, follow up ordered  Prior vaccinations: TD or Tdap: 2017  Influenza: 06/2018 Pneumococcal: 2000, 2018 Prevnar13: 2016 Shingles/Zostavax: 2015 Covid 19: 2/2, 2021, ? moderna , will call back   Names of Other Physician/Practitioners you currently use: 1. [facility] here for primary [name] 2. Dr. Delman Cheadle, eye doctor, last visit 09/2017, - reminded diabetic eye  3. [place], dentist, last visit 2021, q37m Patient [name] Team: MUnk Pinto MD as PCP - General (Internal  Medicine) NDorothy Spark MD as PCP - Cardiology (Cardiology) KChucky May MD as Consulting Physician (Psychiatry) Magrinat, GVirgie Dad MD as Consulting Physician (Oncology) TJovita Kussmaul MD as Consulting Physician (General Surgery) KGery Pray MD as Consulting Physician (Radiation Oncology)  SURGICAL HISTORY She  has a past surgical history that includes Total knee arthroplasty (june 2013); Fracture surgery (2009); Breast lumpectomy with radioactive seed and sentinel lymph node biopsy (Right, 03/08/2018); Cardiac catheterization (11/05/2019); and LEFT HEART CATH AND CORONARY ANGIOGRAPHY (N/A, 11/05/2019). FAMILY HISTORY Her family history includes Breast cancer in her mother; Dementia in her father; Emphysema in her mother; Kidney failure in her father. SOCIAL HISTORY She  reports that she has never smoked. She has never used smokeless tobacco. She reports that she does not drink alcohol and does not use drugs.   MEDICARE WELLNESS OBJECTIVES: Physical activity: Current Exercise Habits: The patient does not participate in regular exercise at present, Exercise limited by: neurologic condition(s) Cardiac risk factors: Cardiac Risk Factors include: advanced age (>549m, >6>19omen);dyslipidemia;hypertension;sedentary lifestyle Depression/mood screen:   [facility] 01/21/2020  Decreased Interest 0  Down, Depressed, Hopeless 0  PHQ - 2 Score 0  Altered sleeping -  Tired, decreased energy -  Change in appetite -  Feeling bad or failure about yourself  -  Trouble concentrating -  Moving slowly or fidgety/restless -  Suicidal thoughts -  PHQ-9 Score -  Difficult doing work/chores -    ADLs:  In your present state of health, do you have any difficulty performing the following activities: 01/21/2020 11/05/2019  Hearing? N -  Vision? N -  Difficulty concentrating or making decisions? Y -  Comment husband and family supervises -  Walking or climbing stairs? N -   Dressing or bathing? N -  Comment needs reminders, will put clothes on backwards -  Doing errands, shopping? Y Tempie DonningPreparing Food and eating ? N -  Comment can feed self, not using stove -  Using the Toilet? N -  In the past six months, have you accidently leaked urine? Y -  Comment occasionally can't find the bathroom -  Do you have problems with loss of bowel control? N -  Managing your Medications? N -  Managing your Finances? Y -  Comment husband manages -  [employer], broadcasting/film/videoY -  Some recent data might be hidden     Cognitive Testing  Alert? Yes  Normal Appearance?Yes  Oriented to person? Yes  Place? Yes   Time? Yes  Recall of three objects?  0/3  Can perform simple calculations? Yes  Displays appropriate judgment? No  Can read the correct time from a watch face?Yes  EOL planning: Does Patient Have a Medical Advance Directive?: No Would patient like information on creating a medical advance directive?: No - Patient declined  Review of Systems  Constitutional: Negative for malaise/fatigue and weight loss.  HENT: Negative for hearing loss and tinnitus.   Eyes: Negative for blurred vision and double vision.  Respiratory: Negative for cough, sputum production, shortness of breath and wheezing.   Cardiovascular: Negative for chest pain, palpitations, orthopnea, claudication, leg swelling and PND.  Gastrointestinal: Negative for abdominal pain, blood in stool, constipation, diarrhea, heartburn, melena, nausea and vomiting.  Genitourinary: Negative.   Musculoskeletal: Negative for falls, joint pain and myalgias.  Skin: Negative for rash.  Neurological: Negative for dizziness, tingling, sensory change, weakness and headaches.  Endo/Heme/Allergies: Negative for polydipsia.  Psychiatric/Behavioral: Positive for memory loss. Negative for depression, hallucinations, substance abuse and suicidal ideas. The patient is not nervous/anxious and does not have  insomnia.   All other systems reviewed and are negative.    Objective:     Today's Vitals   01/21/20 1343  BP: (!) 138/74  Pulse: 71  Temp: 97.7 F (36.5 C)  SpO2: 98%  Weight: 136 lb (61.7 kg)   Body mass index is 22.98 kg/m.  General appearance: alert, no distress, WD/WN, female HEENT: normocephalic, sclerae anicteric, TMs pearly, nares patent, no discharge or erythema, pharynx normal Oral cavity: MMM, no lesions Neck: supple, no lymphadenopathy, no thyromegaly, no masses Heart: RRR, normal S1, S2, no murmurs Lungs: CTA bilaterally, no wheezes, rhonchi, or rales Abdomen: +bs, soft, non tender, non distended, no masses, no hepatomegaly, no splenomegaly Musculoskeletal: nontender, no swelling, no obvious deformity Extremities: no edema, no cyanosis, no clubbing Pulses: 2+ symmetric, upper and lower extremities, normal cap refill Neurological: alert, oriented x 3, CN2-12 intact, strength normal upper extremities and lower extremities, sensation normal throughout, DTRs 2+ throughout, no cerebellar signs, gait normal. Flight of ideas, poor decision making, poor judgement. Answers very simple questions only, cannot follow multi step instructions.  Psychiatric: normal affect, some inappropriate behavior, pleasant   Medicare Attestation I have personally reviewed: The patient's medical and social history Their use of alcohol, tobacco or illicit drugs Their current medications and supplements The patient's functional ability including ADLs,fall risks, home safety risks, cognitive, and hearing and visual impairment Diet and physical activities Evidence for depression or mood disorders  The patient's weight, height, BMI, and visual acuity have been recorded in the chart.  I have made referrals, counseling, and provided education to the patient based on review of the above and I have provided the patient with a written personalized [name] plan for preventive services.     Marie Ribas, NP   01/21/2020

## 2020-01-21 ENCOUNTER — Other Ambulatory Visit: Payer: Self-pay

## 2020-01-21 ENCOUNTER — Encounter: Payer: Self-pay | Admitting: Adult Health

## 2020-01-21 ENCOUNTER — Ambulatory Visit: Payer: Medicare Other | Admitting: Adult Health

## 2020-01-21 VITALS — BP 138/74 | HR 71 | Temp 97.7°F | Wt 136.0 lb

## 2020-01-21 DIAGNOSIS — E1122 Type 2 diabetes mellitus with diabetic chronic kidney disease: Secondary | ICD-10-CM | POA: Diagnosis not present

## 2020-01-21 DIAGNOSIS — Z0000 Encounter for general adult medical examination without abnormal findings: Secondary | ICD-10-CM

## 2020-01-21 DIAGNOSIS — Z6822 Body mass index (BMI) 22.0-22.9, adult: Secondary | ICD-10-CM

## 2020-01-21 DIAGNOSIS — E1169 Type 2 diabetes mellitus with other specified complication: Secondary | ICD-10-CM

## 2020-01-21 DIAGNOSIS — I700 Atherosclerosis of aorta: Secondary | ICD-10-CM | POA: Diagnosis not present

## 2020-01-21 DIAGNOSIS — R6889 Other general symptoms and signs: Secondary | ICD-10-CM

## 2020-01-21 DIAGNOSIS — F0280 Dementia in other diseases classified elsewhere without behavioral disturbance: Secondary | ICD-10-CM

## 2020-01-21 DIAGNOSIS — E559 Vitamin D deficiency, unspecified: Secondary | ICD-10-CM

## 2020-01-21 DIAGNOSIS — Z0001 Encounter for general adult medical examination with abnormal findings: Secondary | ICD-10-CM | POA: Diagnosis not present

## 2020-01-21 DIAGNOSIS — F0390 Unspecified dementia without behavioral disturbance: Secondary | ICD-10-CM

## 2020-01-21 DIAGNOSIS — F0150 Vascular dementia without behavioral disturbance: Secondary | ICD-10-CM

## 2020-01-21 DIAGNOSIS — I252 Old myocardial infarction: Secondary | ICD-10-CM

## 2020-01-21 DIAGNOSIS — Z79899 Other long term (current) drug therapy: Secondary | ICD-10-CM

## 2020-01-21 DIAGNOSIS — F3341 Major depressive disorder, recurrent, in partial remission: Secondary | ICD-10-CM

## 2020-01-21 DIAGNOSIS — F411 Generalized anxiety disorder: Secondary | ICD-10-CM

## 2020-01-21 DIAGNOSIS — Z1211 Encounter for screening for malignant neoplasm of colon: Secondary | ICD-10-CM

## 2020-01-21 DIAGNOSIS — N1831 Chronic kidney disease, stage 3a: Secondary | ICD-10-CM

## 2020-01-21 DIAGNOSIS — C50411 Malignant neoplasm of upper-outer quadrant of right female breast: Secondary | ICD-10-CM

## 2020-01-21 DIAGNOSIS — I10 Essential (primary) hypertension: Secondary | ICD-10-CM

## 2020-01-21 DIAGNOSIS — E785 Hyperlipidemia, unspecified: Secondary | ICD-10-CM | POA: Diagnosis not present

## 2020-01-21 DIAGNOSIS — G309 Alzheimer's disease, unspecified: Secondary | ICD-10-CM | POA: Insufficient documentation

## 2020-01-21 DIAGNOSIS — Z170 Estrogen receptor positive status [ER+]: Secondary | ICD-10-CM

## 2020-01-21 DIAGNOSIS — N183 Chronic kidney disease, stage 3 unspecified: Secondary | ICD-10-CM

## 2020-01-21 DIAGNOSIS — G301 Alzheimer's disease with late onset: Secondary | ICD-10-CM | POA: Diagnosis not present

## 2020-01-21 DIAGNOSIS — I2510 Atherosclerotic heart disease of native coronary artery without angina pectoris: Secondary | ICD-10-CM

## 2020-01-21 DIAGNOSIS — E1121 Type 2 diabetes mellitus with diabetic nephropathy: Secondary | ICD-10-CM | POA: Diagnosis not present

## 2020-01-21 DIAGNOSIS — F0281 Dementia in other diseases classified elsewhere with behavioral disturbance: Secondary | ICD-10-CM | POA: Insufficient documentation

## 2020-01-21 DIAGNOSIS — M8580 Other specified disorders of bone density and structure, unspecified site: Secondary | ICD-10-CM

## 2020-01-21 NOTE — Patient Instructions (Addendum)
Marie [name] , Thank you for taking time to come for your Medicare Wellness Visit. I appreciate your ongoing commitment to your health goals. Please review the following plan we discussed and let me know if I can assist you in the future.   These are the goals we discussed: Goals    . DIET - INCREASE WATER INTAKE     Aim for 4-5 large glasses or bottles of water or clear fluids daily     . Exercise 150 min/wk Moderate Activity       This is a list of the screening recommended for you and due dates:  Health Maintenance  Topic Date Due  . COVID-19 Vaccine (1) Never done  . Eye exam for diabetics  10/05/2018  . Colon Cancer Screening  04/26/2019  . Hemoglobin A1C  05/01/2019  . Urine Protein Check  07/26/2019  . Flu Shot  01/26/2020  . Mammogram  02/02/2020  . Complete foot exam   01/20/2021  . Tetanus Vaccine  08/10/2025  . DEXA scan (bone density measurement)  Completed  .  Hepatitis C: One time screening is recommended by Center for Disease Control  (CDC) for  adults born from [birth year] through 1964.   Completed  . Pneumonia vaccines  Completed      Please schedule diabetic eye exam with Dr. Delman Cheadle Try  [phone]   Try video exercise program daily at home  Try to increase water, set out bottles (4 normal sized daily) Try to reduce sweet tea/soda    Memory Compensation Strategies  1. Use "WARM" strategy.  W= write it down  A= associate it  R= repeat it  M= make a mental note  2.   You can keep a Social worker.  Use a 3-ring notebook with sections for the following: calendar, important names and phone numbers,  medications, doctors' names/phone numbers, lists/reminders, and a section to journal what you did  each day.   3.    Use a calendar to write appointments down.  4.    Write yourself a schedule for the day.  This can be placed on the calendar or in a separate section of the Memory Notebook.  Keeping a  regular schedule can help memory.  5.    Use  medication organizer with sections for each day or morning/evening pills.  You may need help loading it  6.    Keep a basket, or pegboard by the door.  Place items that you need to take out with you in the basket or on the pegboard.  You may also want to  include a message board for reminders.  7.    Use sticky notes.  Place sticky notes with reminders in a place where the task is performed.  For example: " turn off the  stove" placed by the stove, "lock the door" placed on the door at eye level, " take your medications" on  the bathroom mirror or by the place where you normally take your medications.  8.    Use alarms/timers.  Use while cooking to remind yourself to check on food or as a reminder to take your medicine, or as a  reminder to make a call, or as a reminder to perform another task, etc.  Dementia Caregiver Guide Dementia is a term used to describe a number of symptoms that affect memory and thinking. The most common symptoms include:  Memory loss.  Trouble with language and communication.  Trouble concentrating.  Poor judgment.  Problems with reasoning.  Child-like behavior and language.  Extreme anxiety.  Angry outbursts.  Wandering from home or public places. Dementia usually gets worse slowly over time. In the early stages, people with dementia can stay independent and safe with some help. In later stages, they need help with daily tasks such as dressing, grooming, and using the bathroom. How to help the person with dementia cope Dementia can be frightening and confusing. Here are some tips to help the person with dementia cope with changes caused by the disease. General tips  Keep the person on track with his or her routine.  Try to identify areas where the person may need help.  Be supportive, patient, calm, and encouraging.  Gently remind the person that adjusting to changes takes time.  Help with the tasks that the person has asked for help  with.  Keep the person involved in daily tasks and decisions as much as possible.  Encourage conversation, but try not to get frustrated or harried if the person struggles to find words or does not seem to appreciate your help. Communication tips  When the person is talking or seems frustrated, make eye contact and hold the person's hand.  Ask specific questions that need yes or no answers.  Use simple words, short sentences, and a calm voice. Only give one direction at a time.  When offering choices, limit them to just 1 or 2.  Avoid correcting the person in a negative way.  If the person is struggling to find the right words, gently try to help him or her. How to recognize symptoms of stress Symptoms of stress in caregivers include:  Feeling frustrated or angry with the person with dementia.  Denying that the person has dementia or that his or her symptoms will not improve.  Feeling hopeless and unappreciated.  Difficulty sleeping.  Difficulty concentrating.  Feeling anxious, irritable, or depressed.  Developing stress-related health problems.  Feeling like you have too little time for your own life. Follow these instructions at home:   Make sure that you and the person you are caring for: ? Get regular sleep. ? Exercise regularly. ? Eat regular, nutritious meals. ? Drink enough fluid to keep your urine clear or pale yellow. ? Take over-the-counter and prescription medicines only as told by your health care providers. ? Attend all scheduled health care appointments.  Join a support group with others who are caregivers.  Ask about respite care resources so that you can have a regular break from the stress of caregiving.  Look for signs of stress in yourself and in the person you are caring for. If you notice signs of stress, take steps to manage it.  Consider any safety risks and take steps to avoid them.  Organize medications in a pill box for each day of the  week.  Create a plan to handle any legal or financial matters. Get legal or financial advice if needed.  Keep a calendar in a central location to remind the person of appointments or other activities. Tips for reducing the risk of injury  Keep floors clear of clutter. Remove rugs, magazine racks, and floor lamps.  Keep hallways well lit, especially at night.  Put a handrail and nonslip mat in the bathtub or shower.  Put childproof locks on cabinets that contain dangerous items, such as medicines, alcohol, guns, toxic cleaning items, sharp tools or utensils, matches, and lighters.  Put the locks in places where the person cannot see or reach  them easily. This will help ensure that the person does not wander out of the house and get lost.  Be prepared for emergencies. Keep a list of emergency phone numbers and addresses in a convenient area.  Remove car keys and lock garage doors so that the person does not try to get in the car and drive.  Have the person wear a bracelet that tracks locations and identifies the person as having memory problems. This should be worn at all times for safety. Where to find support: Many individuals and organizations offer support. These include:  Support groups for people with dementia and for caregivers.  Counselors or therapists.  Home health care services.  Adult day care centers. Where to find more information Alzheimer's Association: [employer] Contact a health care provider if:  The person's health is rapidly getting worse.  You are no longer able to care for the person.  Caring for the person is affecting your physical and emotional health.  The person threatens himself or herself, you, or anyone else. Summary  Dementia is a term used to describe a number of symptoms that affect memory and thinking.  Dementia usually gets worse slowly over time.  Take steps to reduce the person's risk of injury, and to plan for future  care.  Caregivers need support, relief from caregiving, and time for their own lives. This information is not intended to replace advice given to you by your health care provider. Make sure you discuss any questions you have with your health care provider. Document Revised: 05/26/2017 Document Reviewed: 05/17/2016 Elsevier Patient Education  2020 [employer].

## 2020-01-22 ENCOUNTER — Other Ambulatory Visit: Payer: Self-pay | Admitting: Adult Health

## 2020-01-22 MED ORDER — ROSUVASTATIN CALCIUM 40 MG PO TABS: ORAL_TABLET | ORAL | 1 refills | Status: DC

## 2020-01-23 LAB — COMPLETE METABOLIC PANEL WITH GFR
AG Ratio: 1.7 (calc) (ref 1.0–2.5)
ALT: 21 U/L (ref 6–29)
AST: 27 U/L (ref 10–35)
Albumin: 4.4 g/dL (ref 3.6–5.1)
Alkaline phosphatase (APISO): 53 U/L (ref 37–153)
BUN/Creatinine Ratio: 11 (calc) (ref 6–22)
BUN: 12 mg/dL (ref 7–25)
CO2: 28 mmol/L (ref 20–32)
Calcium: 9.6 mg/dL (ref 8.6–10.4)
Chloride: 103 mmol/L (ref 98–110)
Creat: 1.12 mg/dL — ABNORMAL HIGH (ref 0.50–0.99)
GFR, Est African American: 58 mL/min/{1.73_m2} — ABNORMAL LOW (ref >=60)
GFR, Est Non African American: 50 mL/min/{1.73_m2} — ABNORMAL LOW (ref >=60)
Globulin: 2.6 g/dL (calc) (ref 1.9–3.7)
Glucose, Bld: 86 mg/dL (ref 65–99)
Potassium: 3.7 mmol/L (ref 3.5–5.3)
Sodium: 142 mmol/L (ref 135–146)
Total Bilirubin: 0.9 mg/dL (ref 0.2–1.2)
Total Protein: 7 g/dL (ref 6.1–8.1)

## 2020-01-23 LAB — CBC WITH DIFFERENTIAL/PLATELET
Absolute Monocytes: 511 cells/uL (ref 200–950)
Basophils Absolute: 69 cells/uL (ref 0–200)
Basophils Relative: 1 %
Eosinophils Absolute: 28 cells/uL (ref 15–500)
Eosinophils Relative: 0.4 %
HCT: 41 % (ref 35.0–45.0)
Hemoglobin: 13.7 g/dL (ref 11.7–15.5)
Lymphs Abs: 980 cells/uL (ref 850–3900)
MCH: 30.6 pg (ref 27.0–33.0)
MCHC: 33.4 g/dL (ref 32.0–36.0)
MCV: 91.7 fL (ref 80.0–100.0)
MPV: 9.4 fL (ref 7.5–12.5)
Monocytes Relative: 7.4 %
Neutro Abs: 5313 cells/uL (ref 1500–7800)
Neutrophils Relative %: 77 %
Platelets: 253 10*3/uL (ref 140–400)
RBC: 4.47 10*6/uL (ref 3.80–5.10)
RDW: 12.2 % (ref 11.0–15.0)
Total Lymphocyte: 14.2 %
WBC: 6.9 10*3/uL (ref 3.8–10.8)

## 2020-01-23 LAB — URINALYSIS W MICROSCOPIC + REFLEX CULTURE
Bacteria, UA: NONE SEEN /HPF
Bilirubin Urine: NEGATIVE
Glucose, UA: NEGATIVE
Hgb urine dipstick: NEGATIVE
Hyaline Cast: NONE SEEN /LPF
Ketones, ur: NEGATIVE
Nitrites, Initial: NEGATIVE
Protein, ur: NEGATIVE
RBC / HPF: NONE SEEN /HPF (ref 0–2)
Specific Gravity, Urine: 1.016 (ref 1.001–1.03)
pH: 6 (ref 5.0–8.0)

## 2020-01-23 LAB — MICROALBUMIN / CREATININE URINE RATIO
Creatinine, Urine: 125 mg/dL (ref 20–275)
Microalb Creat Ratio: 5 mcg/mg creat (ref <30)
Microalb, Ur: 0.6 mg/dL

## 2020-01-23 LAB — VITAMIN D 25 HYDROXY (VIT D DEFICIENCY, FRACTURES): Vit D, 25-Hydroxy: 20 ng/mL — ABNORMAL LOW (ref 30–100)

## 2020-01-23 LAB — HEMOGLOBIN A1C
Hgb A1c MFr Bld: 6.2 % of total Hgb — ABNORMAL HIGH (ref <5.7)
Mean Plasma Glucose: 131 (calc)
eAG (mmol/L): 7.3 (calc)

## 2020-01-23 LAB — TSH: TSH: 2.57 mIU/L (ref 0.40–4.50)

## 2020-01-23 LAB — URINE CULTURE
MICRO NUMBER:: 10756799
SPECIMEN QUALITY:: ADEQUATE

## 2020-01-23 LAB — CULTURE INDICATED

## 2020-01-23 LAB — LIPID PANEL
Cholesterol: 166 mg/dL (ref <200)
HDL: 64 mg/dL (ref >=50)
LDL Cholesterol (Calc): 85 mg/dL (calc)
Non-HDL Cholesterol (Calc): 102 mg/dL (calc) (ref <130)
Total CHOL/HDL Ratio: 2.6 (calc) (ref <5.0)
Triglycerides: 76 mg/dL (ref <150)

## 2020-01-23 LAB — MAGNESIUM: Magnesium: 2 mg/dL (ref 1.5–2.5)

## 2020-03-30 ENCOUNTER — Other Ambulatory Visit: Payer: Self-pay | Admitting: Physician Assistant

## 2020-04-22 DIAGNOSIS — Z85828 Personal history of other malignant neoplasm of skin: Secondary | ICD-10-CM | POA: Insufficient documentation

## 2020-04-22 NOTE — Progress Notes (Deleted)
3 MONTH FOLLOW UP  Assessment:    Atherosclerosis of aorta Per CTA 10/2019 Control blood pressure, cholesterol, glucose, increase exercise.   CAD/hx of NSTEMI Dr. Meda Coffee following; nonobstructive on cath, recommended medical therapy On bASA, optimize statin for LDL goal <70 Denies angina   Essential hypertension  Currently at goal off of medications Monitor blood pressure at home; call if consistently over 130/80 Continue DASH diet.   Reminder to go to the ER if any CP, SOB, nausea, dizziness, severe HA, changes vision/speech, left arm numbness and tingling and jaw pain.  Non-seasonal allergic rhinitis, unspecified trigger Continue OTC allergy pills  Type 2 diabetes mellitus with stage 3 chronic kidney disease, without long-term current use of insulin (HCC) Currently off of metformin secondary to acute kidney injury; recent A1Cs improved in prediabetic range Education: Reviewed 'ABCs' of diabetes management (respective goals in parentheses):  A1C (<7), blood pressure (<130/80), and cholesterol (LDL <70) Eye Exam yearly and Dental Exam every 6 months - reminded to schedule diabetic eye exam  Dietary recommendations Physical Activity recommendations Reminded to check feet daily  CKD stage 3 due to type 2 diabetes mellitus (HCC) Increase fluids, avoid NSAIDS, monitor sugars, will monitor Check CMP/GFR Refer to nephrology if insufficient recovery from recent acute decline/injury -  Poorly adherent to recommendations for fluid intake  Dementia (HCC)/ Advancing dementia  Continue aricept Does fairly with ADLs, responds well to promps and reminders family members are around to help monitor at home However notable decline over the last year, will get lost in home Needs to be reminded to drink sufficient fluids; strategies discussed Last neuro evaluation remote; advised follow up with Dr. Toy Care and will refer back to Dr. Leta Baptist after checking labs today as last follow up was  remote ***  Vitamin D deficiency She is on vitamin D 5000 IU daily; titrate for goal of 60-100 ***  Medication management CBC, CMP/GFR  Hyperlipidemia Titrate statin as tolerated LDL goal <70 Continue low cholesterol diet and exercise.  Check lipid panel.   Generalized anxiety disorder Depression, controlled Managed by Dr. Toy Care, advised follow up ***  Body mass index (BMI) of 23.0-23.9 Continue to recommend diet heavy in fruits and veggies and low in animal meats, cheeses, and dairy products, appropriate calorie intake Discuss exercise recommendations routinely Continue to monitor weight at each visit  Estrogen sensitive R breast cancer ([place]) On tamoxifen, followed by Dr. Jana Hakim Overdue mammogram - reminded to schedule, has # ***    No orders of the defined types were placed in this encounter.   Over 40 minutes of exam, counseling, chart review and critical decision making was performed Future Appointments  Date Time Provider [place]  04/23/2020 11:00 AM Liane Comber, NP [facility] None  10/19/2020 11:00 AM [name], MD [facility] None  01/27/2021  2:30 PM Liane Comber, NP [facility] None     Subjective:  Marie [name] is a 69 y.o. female who presents for  3 month follow up.   Patient has been on SS Disability since 2015 for Depression and was followed by Dr Toy Care though reports no recent follow up. She is currently off of mood meds ***  She was evaluated by Dr Leta Baptist in 2012 for her Dementia & she requires moderate supervision by her caretaker husband and her children; he drives, manages meds and finances, was able to complete ADLs but decline in the last year, needs reminders to bathe, will put on clothing inside out or backwards, occasionally can't find bathroom in  home and will wet, or get lost going from house to car parked outdoors. No wandering.   She was on namenda, now on aricept prescribed through this office. Husband feels she is  worse in the last year, can't find toilet is in her home or where car is parked. Wandering thought process, struggles to stay on topic.   She was diagnosed with R breast cancer in 2019, clinical T1b N0, stage IA, estrogen sensitive, s/p R lumpectomy and radiation followed by Dr. Jana Hakim and on tamoxifen and doing well with good prognosis. 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   BMI is There is no height or weight on file to calculate BMI., she has been working on diet and exercise, avoiding white foods and no longer walking due to memory.  Wt Readings from Last 3 Encounters:  01/21/20 136 lb (61.7 kg)  12/24/19 138 lb 9.6 oz (62.9 kg)  11/19/19 137 lb (62.1 kg)   In May 2021 she presented to ED for L sided chest pain with elevated troponin.  CTA withatherosclerotic calcifications of the thoracic aorta without aneurysmal dilatationalong with coronary calcifications, no PE.Cardiac cath was done which showed mild nonobstructive CAD with normal LV systolic function with no further ischemic work-up recommended and medical management of CAD, on bASA and crestor. Following with Dr. Meda Coffee.    Her blood pressure has been controlled at home, today their BP is   She does workout. She denies chest pain, shortness of breath, dizziness.   She is on cholesterol medication (rosuvastatin 20 mg daily *** 40 mg? ) and denies myalgias. Her cholesterol is not at goal of LDL <70.The cholesterol last visit was:   Lab Results  Component Value Date   CHOL 166 01/21/2020   HDL 64 01/21/2020   LDLCALC 85 01/21/2020   TRIG 76 01/21/2020   CHOLHDL 2.6 01/21/2020   . She has been working on diet for T2DM controlled by diet/lifestyle (formerly on metformin, was d/c'd after renal function decline which has since improved), and denies foot ulcerations, increased appetite, nausea, paresthesia of the feet, polydipsia, polyuria, visual disturbances, vomiting and weight loss.  Reports fasting sugars running 130s.  Last  A1C in the office was:  Lab Results  Component Value Date   HGBA1C 6.2 (H) 01/21/2020   She has CKD III associated with T2DM monitored at this office. Last GFR: Lab Results  Component Value Date   GFRNONAA 50 (L) 01/21/2020  She reports currently drinking around 16 fluid ounces x 2 Does drink sweet tea and soda, 3 cans/day    Patient is reportedly on vitamin D supplement but remains well below goal at recent check:  Lab Results  Component Value Date   VD25OH 20 (L) 01/21/2020      Medication Review: Current Outpatient Medications on File Prior to Visit  Medication Sig Dispense Refill  . aspirin EC 81 MG EC tablet Take 1 tablet (81 mg total) by mouth daily.    . Blood Glucose Monitoring Suppl (ONETOUCH VERIO) w/Device KIT 1 kit by Does not apply route as needed. Use to check glucose QD DX-E11.22 1 kit 0  . cetirizine (ZYRTEC) 10 MG tablet Take 10 mg by mouth as needed for allergies.     . cholecalciferol (VITAMIN D) 1000 units tablet Take 10,000 Units by mouth daily.    . clobetasol ointment (TEMOVATE) 0.08 % Apply 1 application topically 2 (two) times daily. 30 g 0  . diphenhydrAMINE (BENADRYL) 25 MG tablet Take 25 mg by  mouth as needed for itching or allergies.     Marland Kitchen donepezil (ARICEPT) 10 MG tablet Take      1 tablet       Daily       for Memory 90 tablet 0  . glucose blood test strip CHECK BLOOD SUGAR 1 TIME DAILY-DX e11.22 100 each 12  . Multiple Vitamin (MULTIVITAMIN) tablet Take 1 tablet by mouth daily.    . nitroGLYCERIN (NITROSTAT) 0.4 MG SL tablet Place 1 tablet (0.4 mg total) under the tongue every 5 (five) minutes x 3 doses as needed for chest pain. 25 tablet 0  . ondansetron (ZOFRAN) 8 MG tablet Take 1/2 to 1 tablet 2 to 3 x / day if needed for nausea / vomiting 30 tablet 2  . ONE TOUCH LANCETS MISC Use as directed to check glucose daily. DX-E11.22 100 each 1  . rosuvastatin (CRESTOR) 40 MG tablet Take one tablet daily for cholesterol 90 tablet 1  . tamoxifen (NOLVADEX)  20 MG tablet Take 20 mg by mouth daily.    Marland Kitchen triamcinolone ointment (KENALOG) 0.1 % Apply 1 application topically 2 (two) times daily. Apply sparingly to Rash of legs 2 x /day & cover with Saran Wrap 453.6 g 1   No current facility-administered medications on file prior to visit.    Allergies  Allergen Reactions  . Penicillins Rash    Swelling in face - last 10 years    Current Problems (verified) Patient Active Problem List   Diagnosis Date Noted  . Osteopenia 01/21/2020  . Advancing dementia ([place]) 01/21/2020  . Aortic atherosclerosis ([place]) 01/20/2020  . CAD (coronary artery disease) 11/06/2019  . History of NSTEMI - 11/04/19 11/04/2019  . Vascular dementia without behavioral disturbance ([place]) 05/21/2019  . Malignant neoplasm of upper-outer quadrant of right breast in female, estrogen receptor positive ([place]) 02/13/2018  . CKD stage 3 due to type 2 diabetes mellitus ([place]) 10/04/2017  . BMI 22.0-22.9, adult 05/05/2015  . Generalized anxiety disorder 08/04/2014  . Essential hypertension 04/28/2014  . Medication management 04/28/2014  . Vitamin D deficiency 10/04/2013  . Type 2 diabetes mellitus with stage 3a chronic kidney disease, without long-term current use of insulin ([place])   . Depression, major, recurrent, in partial remission ([place])   . SDAT    . Hyperlipidemia associated with type 2 diabetes mellitus ([place]) 07/28/2007  . Allergic rhinitis 07/28/2007    SURGICAL HISTORY She  has a past surgical history that includes Total knee arthroplasty (june 2013); Fracture surgery (2009); Breast lumpectomy with radioactive seed and sentinel lymph node biopsy (Right, 03/08/2018); Cardiac catheterization (11/05/2019); and LEFT HEART CATH AND CORONARY ANGIOGRAPHY (N/A, 11/05/2019). FAMILY HISTORY Her family history includes Breast cancer in her mother; Dementia in her father; Emphysema in her mother; Kidney failure in her father. SOCIAL HISTORY She  reports that she has never smoked. She has  never used smokeless tobacco. She reports that she does not drink alcohol and does not use drugs.   Review of Systems  Constitutional: Negative for malaise/fatigue and weight loss.  HENT: Negative for hearing loss and tinnitus.   Eyes: Negative for blurred vision and double vision.  Respiratory: Negative for cough, sputum production, shortness of breath and wheezing.   Cardiovascular: Negative for chest pain, palpitations, orthopnea, claudication, leg swelling and PND.  Gastrointestinal: Negative for abdominal pain, blood in stool, constipation, diarrhea, heartburn, melena, nausea and vomiting.  Genitourinary: Negative.   Musculoskeletal: Negative for falls, joint pain and myalgias.  Skin: Negative for rash.  Neurological: Negative for dizziness, tingling, sensory change, weakness and headaches.  Endo/Heme/Allergies: Negative for polydipsia.  Psychiatric/Behavioral: Positive for memory loss. Negative for depression, hallucinations, substance abuse and suicidal ideas. The patient is not nervous/anxious and does not have insomnia.   All other systems reviewed and are negative.    Objective:     There were no vitals filed for this visit. There is no height or weight on file to calculate BMI.  General appearance: alert, no distress, WD/WN, female HEENT: normocephalic, sclerae anicteric, TMs pearly, nares patent, no discharge or erythema, pharynx normal Oral cavity: MMM, no lesions Neck: supple, no lymphadenopathy, no thyromegaly, no masses Heart: RRR, normal S1, S2, no murmurs Lungs: CTA bilaterally, no wheezes, rhonchi, or rales Abdomen: +bs, soft, non tender, non distended, no masses, no hepatomegaly, no splenomegaly Musculoskeletal: nontender, no swelling, no obvious deformity Extremities: no edema, no cyanosis, no clubbing Pulses: 2+ symmetric, upper and lower extremities, normal cap refill Neurological: alert, oriented x 3, CN2-12 intact, strength normal upper extremities and  lower extremities, sensation normal throughout, DTRs 2+ throughout, no cerebellar signs, gait normal. Flight of ideas, poor decision making, poor judgement. Answers very simple questions only, cannot follow multi step instructions. *** Psychiatric: normal affect, some inappropriate behavior, pleasant    Izora Ribas, NP   04/22/2020

## 2020-04-23 ENCOUNTER — Ambulatory Visit: Payer: Medicare PPO | Admitting: Adult Health

## 2020-05-20 NOTE — Progress Notes (Deleted)
3 MONTH FOLLOW UP  Assessment:    Atherosclerosis of aorta Per CTA 10/2019 Control blood pressure, cholesterol, glucose, increase exercise.   CAD/hx of NSTEMI Dr. Meda Coffee following; nonobstructive on cath, recommended medical therapy On bASA, optimize statin for LDL goal <70 Denies angina   Essential hypertension  Currently at goal off of medications Monitor blood pressure at home; call if consistently over 130/80 Continue DASH diet.   Reminder to go to the ER if any CP, SOB, nausea, dizziness, severe HA, changes vision/speech, left arm numbness and tingling and jaw pain.  Non-seasonal allergic rhinitis, unspecified trigger Continue OTC allergy pills  Type 2 diabetes mellitus with stage 3 chronic kidney disease, without long-term current use of insulin (HCC) Currently off of metformin secondary to acute kidney injury; recent A1Cs improved in prediabetic range Education: Reviewed 'ABCs' of diabetes management (respective goals in parentheses):  A1C (<7), blood pressure (<130/80), and cholesterol (LDL <70) Eye Exam yearly and Dental Exam every 6 months - reminded to schedule diabetic eye exam  Dietary recommendations Physical Activity recommendations Reminded to check feet daily  CKD stage 3 due to type 2 diabetes mellitus (HCC) Increase fluids, avoid NSAIDS, monitor sugars, will monitor Check CMP/GFR Refer to nephrology if insufficient recovery from recent acute decline/injury -  Poorly adherent to recommendations for fluid intake  Dementia (HCC)/ Advancing dementia  Continue aricept Does fairly with ADLs, responds well to promps and reminders family members are around to help monitor at home However notable decline over the last year, will get lost in home Needs to be reminded to drink sufficient fluids; strategies discussed Last neuro evaluation remote; advised follow up with Dr. Toy Care and will refer back to Dr. Leta Baptist after checking labs today as last follow up was  remote ***  Vitamin D deficiency She is on vitamin D 5000 IU daily; titrate for goal of 60-100 ***  Medication management CBC, CMP/GFR  Hyperlipidemia Titrate statin as tolerated LDL goal <70 Continue low cholesterol diet and exercise.  Check lipid panel.   Generalized anxiety disorder Depression, controlled Managed by Dr. Toy Care, advised follow up ***  Body mass index (BMI) of 23.0-23.9 Continue to recommend diet heavy in fruits and veggies and low in animal meats, cheeses, and dairy products, appropriate calorie intake Discuss exercise recommendations routinely Continue to monitor weight at each visit  Estrogen sensitive R breast cancer ([place]) On tamoxifen, followed by Dr. Jana Hakim Overdue mammogram - reminded to schedule, has # ***    No orders of the defined types were placed in this encounter.   Over 40 minutes of exam, counseling, chart review and critical decision making was performed Future Appointments  Date Time Provider [place]  05/25/2020  9:30 AM Liane Comber, NP [facility] None  10/19/2020 11:00 AM [name], MD [facility] None  01/27/2021  2:30 PM Liane Comber, NP [facility] None     Subjective:  Marie [name] is a 69 y.o. female who presents for  3 month follow up.   Patient has been on SS Disability since 2015 for Depression and was followed by Dr Toy Care though reports no recent follow up. She is currently off of mood meds ***  She was evaluated by Dr Leta Baptist in 2012 for her Dementia & she requires moderate supervision by her caretaker husband and her children; he drives, manages meds and finances, was able to complete ADLs but decline in the last year, needs reminders to bathe, will put on clothing inside out or backwards, occasionally can't find bathroom  in home and will wet, or get lost going from house to car parked outdoors. No wandering.   She was on namenda, now on aricept prescribed through this office. Husband feels she is  worse in the last year, can't find toilet is in her home or where car is parked. Wandering thought process, struggles to stay on topic.   She was diagnosed with R breast cancer in 2019, clinical T1b N0, stage IA, estrogen sensitive, s/p R lumpectomy and radiation followed by Dr. Jana Hakim and on tamoxifen and doing well with good prognosis. 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   BMI is There is no height or weight on file to calculate BMI., she has been working on diet and exercise, avoiding white foods and no longer walking due to memory.  Wt Readings from Last 3 Encounters:  01/21/20 136 lb (61.7 kg)  12/24/19 138 lb 9.6 oz (62.9 kg)  11/19/19 137 lb (62.1 kg)   In May 2021 she presented to ED for L sided chest pain with elevated troponin.  CTA withatherosclerotic calcifications of the thoracic aorta without aneurysmal dilatationalong with coronary calcifications, no PE.Cardiac cath was done which showed mild nonobstructive CAD with normal LV systolic function with no further ischemic work-up recommended and medical management of CAD, on bASA and crestor. Following with Dr. Meda Coffee.    Her blood pressure has been controlled at home, today their BP is   She does workout. She denies chest pain, shortness of breath, dizziness.   She is on cholesterol medication (rosuvastatin 20 mg daily *** 40 mg? ) and denies myalgias. Her cholesterol is not at goal of LDL <70.The cholesterol last visit was:   Lab Results  Component Value Date   CHOL 166 01/21/2020   HDL 64 01/21/2020   LDLCALC 85 01/21/2020   TRIG 76 01/21/2020   CHOLHDL 2.6 01/21/2020   . She has been working on diet for T2DM controlled by diet/lifestyle (formerly on metformin, was d/c'd after renal function decline which has since improved), and denies foot ulcerations, increased appetite, nausea, paresthesia of the feet, polydipsia, polyuria, visual disturbances, vomiting and weight loss.  Reports fasting sugars running 130s.  Last  A1C in the office was:  Lab Results  Component Value Date   HGBA1C 6.2 (H) 01/21/2020   She has CKD III associated with T2DM monitored at this office. Last GFR: Lab Results  Component Value Date   GFRNONAA 50 (L) 01/21/2020  She reports currently drinking around 16 fluid ounces x 2 Does drink sweet tea and soda, 3 cans/day    Patient is reportedly on vitamin D supplement but remains well below goal at recent check:  Lab Results  Component Value Date   VD25OH 20 (L) 01/21/2020      Medication Review: Current Outpatient Medications on File Prior to Visit  Medication Sig Dispense Refill  . aspirin EC 81 MG EC tablet Take 1 tablet (81 mg total) by mouth daily.    . Blood Glucose Monitoring Suppl (ONETOUCH VERIO) w/Device KIT 1 kit by Does not apply route as needed. Use to check glucose QD DX-E11.22 1 kit 0  . cetirizine (ZYRTEC) 10 MG tablet Take 10 mg by mouth as needed for allergies.     . cholecalciferol (VITAMIN D) 1000 units tablet Take 10,000 Units by mouth daily.    . clobetasol ointment (TEMOVATE) 8.33 % Apply 1 application topically 2 (two) times daily. 30 g 0  . diphenhydrAMINE (BENADRYL) 25 MG tablet Take 25 mg  by mouth as needed for itching or allergies.     Marland Kitchen donepezil (ARICEPT) 10 MG tablet Take      1 tablet       Daily       for Memory 90 tablet 0  . glucose blood test strip CHECK BLOOD SUGAR 1 TIME DAILY-DX e11.22 100 each 12  . Multiple Vitamin (MULTIVITAMIN) tablet Take 1 tablet by mouth daily.    . nitroGLYCERIN (NITROSTAT) 0.4 MG SL tablet Place 1 tablet (0.4 mg total) under the tongue every 5 (five) minutes x 3 doses as needed for chest pain. 25 tablet 0  . ondansetron (ZOFRAN) 8 MG tablet Take 1/2 to 1 tablet 2 to 3 x / day if needed for nausea / vomiting 30 tablet 2  . ONE TOUCH LANCETS MISC Use as directed to check glucose daily. DX-E11.22 100 each 1  . rosuvastatin (CRESTOR) 40 MG tablet Take one tablet daily for cholesterol 90 tablet 1  . tamoxifen (NOLVADEX)  20 MG tablet Take 20 mg by mouth daily.    Marland Kitchen triamcinolone ointment (KENALOG) 0.1 % Apply 1 application topically 2 (two) times daily. Apply sparingly to Rash of legs 2 x /day & cover with Saran Wrap 453.6 g 1   No current facility-administered medications on file prior to visit.    Allergies  Allergen Reactions  . Penicillins Rash    Swelling in face - last 10 years    Current Problems (verified) Patient Active Problem List   Diagnosis Date Noted  . History of SCC (squamous cell carcinoma) of skin (chest, 03/2019) 04/22/2020  . Osteopenia 01/21/2020  . Advancing dementia ([place]) 01/21/2020  . Aortic atherosclerosis ([place]) 01/20/2020  . CAD (coronary artery disease) 11/06/2019  . History of NSTEMI - 11/04/19 11/04/2019  . Vascular dementia without behavioral disturbance ([place]) 05/21/2019  . Malignant neoplasm of upper-outer quadrant of right breast in female, estrogen receptor positive ([place]) 02/13/2018  . CKD stage 3 due to type 2 diabetes mellitus ([place]) 10/04/2017  . BMI 22.0-22.9, adult 05/05/2015  . Generalized anxiety disorder 08/04/2014  . Essential hypertension 04/28/2014  . Medication management 04/28/2014  . Vitamin D deficiency 10/04/2013  . Type 2 diabetes mellitus with stage 3a chronic kidney disease, without long-term current use of insulin ([place])   . Depression, major, recurrent, in partial remission ([place])   . SDAT    . Hyperlipidemia associated with type 2 diabetes mellitus ([place]) 07/28/2007  . Allergic rhinitis 07/28/2007    SURGICAL HISTORY She  has a past surgical history that includes Total knee arthroplasty (june 2013); Fracture surgery (2009); Breast lumpectomy with radioactive seed and sentinel lymph node biopsy (Right, 03/08/2018); Cardiac catheterization (11/05/2019); and LEFT HEART CATH AND CORONARY ANGIOGRAPHY (N/A, 11/05/2019). FAMILY HISTORY Her family history includes Breast cancer in her mother; Dementia in her father; Emphysema in her mother; Kidney failure  in her father. SOCIAL HISTORY She  reports that she has never smoked. She has never used smokeless tobacco. She reports that she does not drink alcohol and does not use drugs.   Review of Systems  Constitutional: Negative for malaise/fatigue and weight loss.  HENT: Negative for hearing loss and tinnitus.   Eyes: Negative for blurred vision and double vision.  Respiratory: Negative for cough, sputum production, shortness of breath and wheezing.   Cardiovascular: Negative for chest pain, palpitations, orthopnea, claudication, leg swelling and PND.  Gastrointestinal: Negative for abdominal pain, blood in stool, constipation, diarrhea, heartburn, melena, nausea and vomiting.  Genitourinary: Negative.  Musculoskeletal: Negative for falls, joint pain and myalgias.  Skin: Negative for rash.  Neurological: Negative for dizziness, tingling, sensory change, weakness and headaches.  Endo/Heme/Allergies: Negative for polydipsia.  Psychiatric/Behavioral: Positive for memory loss. Negative for depression, hallucinations, substance abuse and suicidal ideas. The patient is not nervous/anxious and does not have insomnia.   All other systems reviewed and are negative.    Objective:     There were no vitals filed for this visit. There is no height or weight on file to calculate BMI.  General appearance: alert, no distress, WD/WN, female HEENT: normocephalic, sclerae anicteric, TMs pearly, nares patent, no discharge or erythema, pharynx normal Oral cavity: MMM, no lesions Neck: supple, no lymphadenopathy, no thyromegaly, no masses Heart: RRR, normal S1, S2, no murmurs Lungs: CTA bilaterally, no wheezes, rhonchi, or rales Abdomen: +bs, soft, non tender, non distended, no masses, no hepatomegaly, no splenomegaly Musculoskeletal: nontender, no swelling, no obvious deformity Extremities: no edema, no cyanosis, no clubbing Pulses: 2+ symmetric, upper and lower extremities, normal cap  refill Neurological: alert, oriented x 3, CN2-12 intact, strength normal upper extremities and lower extremities, sensation normal throughout, DTRs 2+ throughout, no cerebellar signs, gait normal. Flight of ideas, poor decision making, poor judgement. Answers very simple questions only, cannot follow multi step instructions. *** Psychiatric: normal affect, some inappropriate behavior, pleasant    Izora Ribas, NP   05/20/2020

## 2020-05-25 ENCOUNTER — Ambulatory Visit: Payer: Medicare PPO | Admitting: Adult Health

## 2020-05-25 DIAGNOSIS — I2510 Atherosclerotic heart disease of native coronary artery without angina pectoris: Secondary | ICD-10-CM

## 2020-05-25 DIAGNOSIS — F3341 Major depressive disorder, recurrent, in partial remission: Secondary | ICD-10-CM

## 2020-05-25 DIAGNOSIS — E559 Vitamin D deficiency, unspecified: Secondary | ICD-10-CM

## 2020-05-25 DIAGNOSIS — I700 Atherosclerosis of aorta: Secondary | ICD-10-CM

## 2020-05-25 DIAGNOSIS — F0390 Unspecified dementia without behavioral disturbance: Secondary | ICD-10-CM

## 2020-05-25 DIAGNOSIS — Z79899 Other long term (current) drug therapy: Secondary | ICD-10-CM

## 2020-05-25 DIAGNOSIS — E1122 Type 2 diabetes mellitus with diabetic chronic kidney disease: Secondary | ICD-10-CM

## 2020-05-25 DIAGNOSIS — E1169 Type 2 diabetes mellitus with other specified complication: Secondary | ICD-10-CM

## 2020-05-25 DIAGNOSIS — I10 Essential (primary) hypertension: Secondary | ICD-10-CM

## 2020-05-25 DIAGNOSIS — Z6822 Body mass index (BMI) 22.0-22.9, adult: Secondary | ICD-10-CM

## 2020-05-25 DIAGNOSIS — N1831 Chronic kidney disease, stage 3a: Secondary | ICD-10-CM

## 2020-05-25 DIAGNOSIS — F0150 Vascular dementia without behavioral disturbance: Secondary | ICD-10-CM

## 2020-07-11 ENCOUNTER — Other Ambulatory Visit: Payer: Self-pay | Admitting: Internal Medicine

## 2020-07-11 DIAGNOSIS — F0150 Vascular dementia without behavioral disturbance: Secondary | ICD-10-CM

## 2020-07-11 MED ORDER — DONEPEZIL HCL 10 MG PO TABS: ORAL_TABLET | ORAL | 3 refills | Status: DC

## 2020-09-01 NOTE — Progress Notes (Signed)
Assessment and Plan:  Marie [name] was seen today for altered mental status.  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 Leta Baptist who did original workup back in 2012 Follow up here in 1-2 weeks, sooner if needed The patient's caregiver was advised to call immediately if she has any concerning symptoms in the interval. The patient's caregiver voices understanding of current treatment options and is in agreement with the current care plan.They know to call the clinic with any problems, questions or concerns or go to the ER if any further progression of symptoms.  -     CBC with Differential/Platelet -     COMPLETE METABOLIC PANEL WITH GFR -     TSH -     Ammonia -     Urine Culture -     Urinalysis, Routine w reflex microscopic -     DG Chest 2 View; Future -     QUEtiapine (SEROQUEL) 25 MG tablet; Start taking 1 tab at dinner time; add second tab at bedtime or in the morning if needed for agitation.  Cellulitis of breast Intertriginous candidiasis -     fluconazole (DIFLUCAN) 150 MG tablet; Take 1 tab daily for severe yeast infection. -     nystatin (MYCOSTATIN/NYSTOP) powder; Apply daily after a shower as needed -     doxycycline (VIBRAMYCIN) 100 MG capsule; Take 1 capsule 2 x/day with food for 10 days  Cellulitis of right knee -     doxycycline (VIBRAMYCIN) 100 MG capsule; Take 1 capsule 2 x/day with food for 10 days  Hallucinations -     QUEtiapine (SEROQUEL) 25 MG  tablet; Start taking 1 tab at dinner time; add second tab at bedtime or in the morning if needed for agitation.  Other orders -     escitalopram (LEXAPRO) 10 MG tablet; Take 1 tablet (10 mg total) by mouth daily.   Further disposition pending results of labs. Discussed med's effects and SE's.   Over 30 minutes of exam, counseling, chart review, and critical decision making was performed.   Future Appointments  Date Time Provider [place]  10/19/2020 11:00 AM [name], MD [facility] None  01/27/2021  2:30 PM Liane Comber, NP [facility] None    ------------------------------------------------------------------------------------------------------------------   HPI 70 y.o.female with early onset Alzheimer's, progressive, with poor follow up presents for evaluation of hallucinations, worsening memory agitation accompanied by [name] and daughter.   She has early onset likely SDAT predating 2012. Family report significant decline in memory in the last 2-3 months; increasing agitation, lashes out and combative, speaking to people who aren't there in the mirror. They report she will not allow them to bathe her in 2 weeks. She tripped and fell on her R knee several days ago with large scabbed wound with erythema, wouldn't let them clean the area up. Daughter has noted severe rash under bilateral breasts, has been applying vaseline and baby powder with improvement. She has stopped taking many of her medications.   Patient has been on SS Disability since 2015 for Depression and was  followed by Dr Toy Care though not recently. Reached out and was advised need for medical evaluation. She was evaluated by Dr Leta Baptist in 2012 for her Dementia & has requied moderate supervision by her caretaker husband and her children; he drives, manages meds and finances, was able to complete ADLs but significant decline in the last year, as of 12/2019 was needing reminders to bathe, will put on  clothing inside out or backwards, occasionally can't find bathroom in home and will wet, or get lost going from house to car parked outdoors, couldn't find toilet in her own home. Last MRI brain w/wo 05/01/2013 showed essentially normal for age, minimal chronic white matter changes were felt to be stable.    MRI brain w/ w/o contrast was ordered 10/2019 by Vicie Mutters, PA but never completed. She was recommended referral back to Dr. Leta Baptist at last visit but declined.   Currently taking aricept 10 mg daily; has not been on antidepressant recently; remotely on cymbalta, high dose seroquel; suspect cymbalta was d/c'd in 2020 due to potential interaction with tamoxifen for breast cancer (L, ER+, 2019, hasn't followed up mammogram since 01/2018, hasn't been following up).   Past Medical History:  Diagnosis Date   Dementia ([place])    Depression    Diabetes mellitus    Hypertension    Thyroid disease    Type II or unspecified type diabetes mellitus without mention of complication, not stated as uncontrolled    stopped metformin due to diarrhea, no meds now   Vitamin D deficiency      Allergies  Allergen Reactions   Penicillins Rash    Swelling in face - last 10 years    Current Outpatient Medications on File Prior to Visit  Medication Sig   Multiple Vitamin (MULTIVITAMIN) tablet Take 1 tablet by mouth daily.   rosuvastatin (CRESTOR) 40 MG tablet Take one tablet daily for cholesterol   aspirin EC 81 MG EC tablet Take 1 tablet (81 mg total) by mouth daily. (Patient not taking: Reported on 09/02/2020)   Blood Glucose Monitoring Suppl (ONETOUCH VERIO) w/Device KIT 1 kit by Does not apply route as needed. Use to check glucose QD DX-E11.22 (Patient not taking: Reported on 09/02/2020)   cholecalciferol (VITAMIN D) 1000 units tablet Take 10,000 Units by mouth daily. (Patient not taking: Reported on 09/02/2020)   diphenhydrAMINE (BENADRYL) 25 MG tablet Take 25 mg by mouth as needed for  itching or allergies.  (Patient not taking: Reported on 09/02/2020)   glucose blood test strip CHECK BLOOD SUGAR 1 TIME DAILY-DX e11.22 (Patient not taking: Reported on 09/02/2020)   nitroGLYCERIN (NITROSTAT) 0.4 MG SL tablet Place 1 tablet (0.4 mg total) under the tongue every 5 (five) minutes x 3 doses as needed for chest pain. (Patient not taking: Reported on 09/02/2020)   ondansetron (ZOFRAN) 8 MG tablet Take 1/2 to 1 tablet 2 to 3 x / day if needed for nausea / vomiting (Patient not taking: Reported on 09/02/2020)   ONE TOUCH LANCETS MISC Use as directed to check glucose daily. DX-E11.22 (Patient not taking: Reported on 09/02/2020)   tamoxifen (NOLVADEX) 20 MG tablet Take 20 mg by mouth daily. (Patient not taking: Reported on 09/02/2020)   triamcinolone ointment (KENALOG) 0.1 % Apply 1 application topically 2 (two) times daily. Apply sparingly to Rash of legs 2 x /day & cover with [place] Mule (Patient not taking: Reported on 09/02/2020)   No current facility-administered medications on file prior to visit.    ROS: all negative  except above.   Physical Exam:  BP 138/78    Pulse 72    Temp (!) 97.3 F (36.3 C)    Wt 135 lb (61.2 kg)    SpO2 99%    BMI 22.81 kg/m   General Appearance: Well developed elderly female, very poor hygiene, foul body odor; in no acute distress.  Eyes: PERRLA, EOMs, conjunctiva no swelling or erythema Sinuses: No Frontal/maxillary tenderness ENT/Mouth: Ext aud canals clear, TMs without erythema, bulging. No erythema, swelling, or exudate on post pharynx.  Tonsils not swollen or erythematous. Hearing normal.  Neck: Supple, thyroid normal.  Respiratory: Respiratory effort normal, BS equal bilaterally without rales, rhonchi, wheezing or stridor.  Cardio: RRR with no MRGs. Brisk peripheral pulses without edema.  Chest wall: left lower anterior rib cage asymetrical; ? Bony enlargement without tenderness Abdomen: Soft, + BS.  Non tender, no guarding, rebound, hernias,  masses. Lymphatics: Non tender without lymphadenopathy.  Musculoskeletal: no obvious joint effusion; R knee without heat or effusion, non-tender, 5/5 strength upper and lower extremities, normal gait.  Skin: Warm/dry; she has extensive intertrigo erythematous rash under bilateral pendulous breasts, erythema extending up breasts; R knee with large scabbed wound approx 8 cm area with erythematous borders, no purulent discharge Neurological: alert, oriented x 3, CN2-12 intact, strength normal upper extremities and lower extremities, sensation normal throughout, DTRs 2+ throughout, no cerebellar signs, gait normal. Flight of ideas, poor decision making, poor judgement. Answers very simple questions only, cannot follow multi step instructions.  Psychiatric: normal affect, some inappropriate behavior, pleasant, depressed. Appears having visual hallucinations, observed interacting with mirror.     Izora Ribas, NP 5:49 PM [facility]

## 2020-09-02 ENCOUNTER — Other Ambulatory Visit: Payer: Self-pay

## 2020-09-02 ENCOUNTER — Ambulatory Visit (INDEPENDENT_AMBULATORY_CARE_PROVIDER_SITE_OTHER): Payer: Medicare PPO | Admitting: Adult Health

## 2020-09-02 ENCOUNTER — Encounter: Payer: Self-pay | Admitting: Adult Health

## 2020-09-02 VITALS — BP 138/78 | HR 72 | Temp 97.3°F | Wt 135.0 lb

## 2020-09-02 DIAGNOSIS — B372 Candidiasis of skin and nail: Secondary | ICD-10-CM | POA: Diagnosis not present

## 2020-09-02 DIAGNOSIS — L03115 Cellulitis of right lower limb: Secondary | ICD-10-CM | POA: Diagnosis not present

## 2020-09-02 DIAGNOSIS — F0391 Unspecified dementia with behavioral disturbance: Secondary | ICD-10-CM

## 2020-09-02 DIAGNOSIS — N61 Mastitis without abscess: Secondary | ICD-10-CM

## 2020-09-02 DIAGNOSIS — R443 Hallucinations, unspecified: Secondary | ICD-10-CM

## 2020-09-02 DIAGNOSIS — R451 Restlessness and agitation: Secondary | ICD-10-CM

## 2020-09-02 DIAGNOSIS — F0280 Dementia in other diseases classified elsewhere without behavioral disturbance: Secondary | ICD-10-CM

## 2020-09-02 DIAGNOSIS — G301 Alzheimer's disease with late onset: Secondary | ICD-10-CM

## 2020-09-02 DIAGNOSIS — F0281 Dementia in other diseases classified elsewhere with behavioral disturbance: Secondary | ICD-10-CM

## 2020-09-02 DIAGNOSIS — G300 Alzheimer's disease with early onset: Secondary | ICD-10-CM

## 2020-09-02 DIAGNOSIS — F03911 Unspecified dementia, unspecified severity, with agitation: Secondary | ICD-10-CM

## 2020-09-02 DIAGNOSIS — R300 Dysuria: Secondary | ICD-10-CM | POA: Diagnosis not present

## 2020-09-02 MED ORDER — ESCITALOPRAM OXALATE 10 MG PO TABS: 10.0000 mg | ORAL_TABLET | Freq: Every day | ORAL | 1 refills | Status: DC

## 2020-09-02 MED ORDER — DOXYCYCLINE HYCLATE 100 MG PO CAPS: ORAL_CAPSULE | ORAL | 0 refills | Status: DC

## 2020-09-02 MED ORDER — DONEPEZIL HCL 23 MG PO TABS: ORAL_TABLET | ORAL | 1 refills | Status: DC

## 2020-09-02 MED ORDER — QUETIAPINE FUMARATE 25 MG PO TABS: ORAL_TABLET | ORAL | 0 refills | Status: DC

## 2020-09-02 MED ORDER — NYSTATIN 100000 UNIT/GM EX POWD: CUTANEOUS | 2 refills | Status: DC

## 2020-09-02 MED ORDER — FLUCONAZOLE 150 MG PO TABS: ORAL_TABLET | ORAL | 0 refills | Status: DC

## 2020-09-02 NOTE — Patient Instructions (Addendum)
Please start diflucan once daily for yeast  Doxycycline twice daily for bacterial infection  Nystatin powder under breasts daily after cleaning well and drying  Give seroquel 25 mg tonight, given 25 mg again tomorrow morning if needed for agitation prior to chest xray   Get chest xray at [address] [facility]  Bathe prior to going if possible   3+ large bottles of water daily - set out each morning    Dementia Caregiver Guide Dementia is a term used to describe a number of symptoms that affect memory and thinking. The most common symptoms include:  Memory loss.  Trouble with language and communication.  Trouble concentrating.  Poor judgment and problems with reasoning.  Wandering from home or public places.  Extreme anxiety or depression.  Being suspicious or having angry outbursts and accusations.  Child-like behavior and language. Dementia can be frightening and confusing. And taking care of someone with dementia can be challenging. This guide provides tips to help you when providing care for a person with dementia. How to help manage lifestyle changes Dementia usually gets worse slowly over time. In the early stages, people with dementia can stay independent and safe with some help. In later stages, they need help with daily tasks such as dressing, grooming, and using the bathroom. There are actions you can take to help a person manage his or her life while living with this condition. Communicating  When the person is talking or seems frustrated, make eye contact and hold the person's hand.  Ask specific questions that need yes or no answers.  Use simple words, short sentences, and a calm voice. Only give one direction at a time.  When offering choices, limit the person to just one or two.  Avoid correcting the person in a negative way.  If the person is struggling to find the right words, gently try to help him or her. Preventing injury  Keep  floors clear of clutter. Remove rugs, magazine racks, and floor lamps.  Keep hallways well lit, especially at night.  Put a handrail and nonslip mat in the bathtub or shower.  Put childproof locks on cabinets that contain dangerous items, such as medicines, alcohol, guns, toxic cleaning items, sharp tools or utensils, matches, and lighters.  For doors to the outside of the house, put the locks in places where the person cannot see or reach them easily. This will help ensure that the person does not wander out of the house and get lost.  Be prepared for emergencies. Keep a list of emergency phone numbers and addresses in a convenient area.  Remove car keys and lock garage doors so that the person does not try to get in the car and drive.  Have the person wear a bracelet that tracks locations and identifies the person as having memory problems. This should be worn at all times for safety.   Helping with daily life  Keep the person on track with his or her routine.  Try to identify areas where the person may need help.  Be supportive, patient, calm, and encouraging.  Gently remind the person that adjusting to changes takes time.  Help with the tasks that the person has asked for help with.  Keep the person involved in daily tasks and decisions as much as possible.  Encourage conversation, but try not to get frustrated if the person struggles to find words or does not seem to appreciate your help.   How to recognize  stress Look for signs of stress in yourself and in the person you are caring for. If you notice signs of stress, take steps to manage it. Symptoms of stress include:  Feeling anxious, irritable, frustrated, or angry.  Denying that the person has dementia or that his or her symptoms will not improve.  Feeling depressed, hopeless, or unappreciated.  Difficulty sleeping.  Difficulty concentrating.  Developing stress-related health problems.  Feeling like you have too  little time for your own life. Follow these instructions at home: Take care of your health Make sure that you and the person you are caring for:  Get regular sleep.  Exercise regularly.  Eat regular, nutritious meals.  Take over-the-counter and prescription medicines only as told by your health care providers.  Drink enough fluid to keep your urine pale yellow.  Attend all scheduled health care appointments.   General instructions  Join a support group with others who are caregivers.  Ask about respite care resources. Respite care can provide short-term care for the person so that you can have a regular break from the stress of caregiving.  Consider any safety risks and take steps to avoid them.  Organize medicines in a pill box for each day of the week.  Create a plan to handle any legal or financial matters. Get legal or financial advice if needed.  Keep a calendar in a central location to remind the person of appointments or other activities. Where to find support: Many individuals and organizations offer support. These include:  Support groups for people with dementia.  Support groups for caregivers.  Counselors or therapists.  Home health care services.  Adult day care centers. Where to find more information  Centers for Disease Control and Prevention: http://www.wolf.info/  Alzheimer's Association: [place].co.nz  Family Caregiver Alliance: www.caregiver.[place]: www.alzfdn.org Contact a health care provider if:  The person's health is rapidly getting worse.  You are no longer able to care for the person.  Caring for the person is affecting your physical and emotional health.  You are feeling depressed or anxious about caring for the person. Get help right away if:  The person threatens himself or herself, you, or anyone else.  You feel depressed or sad, or feel that you want to harm yourself. If you ever feel like your loved one  may hurt himself or herself or others, or if he or she shares thoughts about taking his or her own life, get help right away. You can go to your nearest emergency department or:  Call your local emergency services (911 in the [place]  Call a suicide crisis helpline, such as the [place] at 484 341 4451. This is open 24 hours a day in the [place]  Text the Crisis Text Line at 934-845-8268 (in the [place].). Summary  Dementia is a term used to describe a number of symptoms that affect memory and thinking.  Dementia usually gets worse slowly over time.  Take steps to reduce the person's risk of injury and to plan for future care.  Caregivers need support, relief from caregiving, and time for their own lives. This information is not intended to replace advice given to you by your health care provider. Make sure you discuss any questions you have with your health care provider. Document Revised: 10/28/2019 Document Reviewed: 10/28/2019 Elsevier Patient Education  [place].

## 2020-09-03 ENCOUNTER — Ambulatory Visit
Admission: RE | Admit: 2020-09-03 | Discharge: 2020-09-03 | Disposition: A | Discharge status: Home / Self Care | Payer: Medicare PPO | Source: Ambulatory Visit | Attending: Adult Health | Admitting: Adult Health

## 2020-09-03 ENCOUNTER — Other Ambulatory Visit: Payer: Self-pay | Admitting: Adult Health

## 2020-09-03 DIAGNOSIS — R079 Chest pain, unspecified: Secondary | ICD-10-CM | POA: Diagnosis not present

## 2020-09-03 DIAGNOSIS — R451 Restlessness and agitation: Secondary | ICD-10-CM

## 2020-09-03 DIAGNOSIS — F0391 Unspecified dementia with behavioral disturbance: Secondary | ICD-10-CM

## 2020-09-03 DIAGNOSIS — I700 Atherosclerosis of aorta: Secondary | ICD-10-CM | POA: Diagnosis not present

## 2020-09-03 DIAGNOSIS — Z9889 Other specified postprocedural states: Secondary | ICD-10-CM | POA: Diagnosis not present

## 2020-09-04 ENCOUNTER — Other Ambulatory Visit: Payer: Self-pay | Admitting: Adult Health

## 2020-09-04 DIAGNOSIS — R443 Hallucinations, unspecified: Secondary | ICD-10-CM

## 2020-09-04 DIAGNOSIS — F0391 Unspecified dementia with behavioral disturbance: Secondary | ICD-10-CM

## 2020-09-04 LAB — COMPLETE METABOLIC PANEL WITH GFR
AG Ratio: 1.8 (calc) (ref 1.0–2.5)
ALT: 13 U/L (ref 6–29)
AST: 16 U/L (ref 10–35)
Albumin: 4.4 g/dL (ref 3.6–5.1)
Alkaline phosphatase (APISO): 61 U/L (ref 37–153)
BUN/Creatinine Ratio: 17 (calc) (ref 6–22)
BUN: 18 mg/dL (ref 7–25)
CO2: 25 mmol/L (ref 20–32)
Calcium: 9.8 mg/dL (ref 8.6–10.4)
Chloride: 106 mmol/L (ref 98–110)
Creat: 1.06 mg/dL — ABNORMAL HIGH (ref 0.50–0.99)
GFR, Est African American: 62 mL/min/{1.73_m2} (ref >=60)
GFR, Est Non African American: 54 mL/min/{1.73_m2} — ABNORMAL LOW (ref >=60)
Globulin: 2.5 g/dL (calc) (ref 1.9–3.7)
Glucose, Bld: 110 mg/dL — ABNORMAL HIGH (ref 65–99)
Potassium: 4 mmol/L (ref 3.5–5.3)
Sodium: 144 mmol/L (ref 135–146)
Total Bilirubin: 1.4 mg/dL — ABNORMAL HIGH (ref 0.2–1.2)
Total Protein: 6.9 g/dL (ref 6.1–8.1)

## 2020-09-04 LAB — URINALYSIS, ROUTINE W REFLEX MICROSCOPIC
Bilirubin Urine: NEGATIVE
Glucose, UA: NEGATIVE
Hgb urine dipstick: NEGATIVE
Nitrite: NEGATIVE
RBC / HPF: NONE SEEN /HPF (ref 0–2)
Specific Gravity, Urine: 1.032 (ref 1.001–1.03)
pH: <=5 (ref 5.0–8.0)

## 2020-09-04 LAB — URINE CULTURE
MICRO NUMBER:: 11627691
SPECIMEN QUALITY:: ADEQUATE

## 2020-09-04 LAB — CBC WITH DIFFERENTIAL/PLATELET
Absolute Monocytes: 688 cells/uL (ref 200–950)
Basophils Absolute: 67 cells/uL (ref 0–200)
Basophils Relative: 0.9 %
Eosinophils Absolute: 81 cells/uL (ref 15–500)
Eosinophils Relative: 1.1 %
HCT: 39.4 % (ref 35.0–45.0)
Hemoglobin: 13.3 g/dL (ref 11.7–15.5)
Lymphs Abs: 962 cells/uL (ref 850–3900)
MCH: 30.9 pg (ref 27.0–33.0)
MCHC: 33.8 g/dL (ref 32.0–36.0)
MCV: 91.4 fL (ref 80.0–100.0)
MPV: 9.4 fL (ref 7.5–12.5)
Monocytes Relative: 9.3 %
Neutro Abs: 5602 cells/uL (ref 1500–7800)
Neutrophils Relative %: 75.7 %
Platelets: 285 10*3/uL (ref 140–400)
RBC: 4.31 10*6/uL (ref 3.80–5.10)
RDW: 12.4 % (ref 11.0–15.0)
Total Lymphocyte: 13 %
WBC: 7.4 10*3/uL (ref 3.8–10.8)

## 2020-09-04 LAB — AMMONIA: Ammonia: 15 umol/L (ref <=72)

## 2020-09-04 LAB — TSH: TSH: 1.04 mIU/L (ref 0.40–4.50)

## 2020-09-04 MED ORDER — QUETIAPINE FUMARATE 50 MG PO TABS: ORAL_TABLET | ORAL | 0 refills | Status: DC

## 2020-09-04 MED ORDER — CEPHALEXIN 500 MG PO CAPS: 500.0000 mg | ORAL_CAPSULE | Freq: Two times a day (BID) | ORAL | 0 refills | Status: AC

## 2020-09-05 ENCOUNTER — Other Ambulatory Visit: Payer: Self-pay | Admitting: Adult Health

## 2020-09-05 DIAGNOSIS — F0280 Dementia in other diseases classified elsewhere without behavioral disturbance: Secondary | ICD-10-CM

## 2020-09-05 DIAGNOSIS — F0281 Dementia in other diseases classified elsewhere with behavioral disturbance: Secondary | ICD-10-CM

## 2020-09-05 DIAGNOSIS — G300 Alzheimer's disease with early onset: Secondary | ICD-10-CM

## 2020-09-05 DIAGNOSIS — G301 Alzheimer's disease with late onset: Secondary | ICD-10-CM

## 2020-09-08 ENCOUNTER — Telehealth: Payer: Self-pay

## 2020-09-08 NOTE — Telephone Encounter (Signed)
Quetiapine 25mg  tablet prescription:  prior authorization complete and approved.

## 2020-09-09 ENCOUNTER — Telehealth: Payer: Self-pay

## 2020-09-09 ENCOUNTER — Emergency Department (HOSPITAL_COMMUNITY)
Admission: EM | Admit: 2020-09-09 | Discharge: 2020-09-10 | Disposition: A | Discharge status: Home / Self Care | Payer: Medicare PPO | Attending: Emergency Medicine | Admitting: Emergency Medicine

## 2020-09-09 ENCOUNTER — Emergency Department (HOSPITAL_COMMUNITY): Payer: Medicare PPO

## 2020-09-09 ENCOUNTER — Encounter (HOSPITAL_COMMUNITY): Payer: Self-pay

## 2020-09-09 ENCOUNTER — Other Ambulatory Visit: Payer: Self-pay

## 2020-09-09 DIAGNOSIS — R4182 Altered mental status, unspecified: Secondary | ICD-10-CM | POA: Diagnosis present

## 2020-09-09 DIAGNOSIS — Z85828 Personal history of other malignant neoplasm of skin: Secondary | ICD-10-CM | POA: Diagnosis not present

## 2020-09-09 DIAGNOSIS — N183 Chronic kidney disease, stage 3 unspecified: Secondary | ICD-10-CM | POA: Diagnosis not present

## 2020-09-09 DIAGNOSIS — C719 Malignant neoplasm of brain, unspecified: Secondary | ICD-10-CM | POA: Diagnosis not present

## 2020-09-09 DIAGNOSIS — I6529 Occlusion and stenosis of unspecified carotid artery: Secondary | ICD-10-CM | POA: Diagnosis not present

## 2020-09-09 DIAGNOSIS — G319 Degenerative disease of nervous system, unspecified: Secondary | ICD-10-CM | POA: Diagnosis not present

## 2020-09-09 DIAGNOSIS — I129 Hypertensive chronic kidney disease with stage 1 through stage 4 chronic kidney disease, or unspecified chronic kidney disease: Secondary | ICD-10-CM | POA: Diagnosis not present

## 2020-09-09 DIAGNOSIS — Z853 Personal history of malignant neoplasm of breast: Secondary | ICD-10-CM | POA: Insufficient documentation

## 2020-09-09 DIAGNOSIS — R531 Weakness: Secondary | ICD-10-CM | POA: Diagnosis not present

## 2020-09-09 DIAGNOSIS — N63 Unspecified lump in unspecified breast: Secondary | ICD-10-CM

## 2020-09-09 DIAGNOSIS — Z96652 Presence of left artificial knee joint: Secondary | ICD-10-CM | POA: Diagnosis not present

## 2020-09-09 DIAGNOSIS — N6310 Unspecified lump in the right breast, unspecified quadrant: Secondary | ICD-10-CM | POA: Diagnosis not present

## 2020-09-09 DIAGNOSIS — F0391 Unspecified dementia with behavioral disturbance: Secondary | ICD-10-CM | POA: Diagnosis not present

## 2020-09-09 DIAGNOSIS — I2510 Atherosclerotic heart disease of native coronary artery without angina pectoris: Secondary | ICD-10-CM | POA: Insufficient documentation

## 2020-09-09 DIAGNOSIS — Z20822 Contact with and (suspected) exposure to covid-19: Secondary | ICD-10-CM | POA: Insufficient documentation

## 2020-09-09 DIAGNOSIS — Z79899 Other long term (current) drug therapy: Secondary | ICD-10-CM | POA: Diagnosis not present

## 2020-09-09 LAB — CBC WITH DIFFERENTIAL/PLATELET
Abs Immature Granulocytes: 0.01 10*3/uL (ref 0.00–0.07)
Basophils Absolute: 0 10*3/uL (ref 0.0–0.1)
Basophils Relative: 1 %
Eosinophils Absolute: 0.1 10*3/uL (ref 0.0–0.5)
Eosinophils Relative: 1 %
HCT: 36 % (ref 36.0–46.0)
Hemoglobin: 12 g/dL (ref 12.0–15.0)
Immature Granulocytes: 0 %
Lymphocytes Relative: 20 %
Lymphs Abs: 0.9 10*3/uL (ref 0.7–4.0)
MCH: 30.5 pg (ref 26.0–34.0)
MCHC: 33.3 g/dL (ref 30.0–36.0)
MCV: 91.4 fL (ref 80.0–100.0)
Monocytes Absolute: 0.5 10*3/uL (ref 0.1–1.0)
Monocytes Relative: 10 %
Neutro Abs: 3.2 10*3/uL (ref 1.7–7.7)
Neutrophils Relative %: 68 %
Platelets: 217 10*3/uL (ref 150–400)
RBC: 3.94 MIL/uL (ref 3.87–5.11)
RDW: 12.9 % (ref 11.5–15.5)
WBC: 4.7 10*3/uL (ref 4.0–10.5)
nRBC: 0 % (ref 0.0–0.2)

## 2020-09-09 LAB — COMPREHENSIVE METABOLIC PANEL
ALT: 13 U/L (ref 0–44)
AST: 21 U/L (ref 15–41)
Albumin: 3.7 g/dL (ref 3.5–5.0)
Alkaline Phosphatase: 48 U/L (ref 38–126)
Anion gap: 9 (ref 5–15)
BUN: 26 mg/dL — ABNORMAL HIGH (ref 8–23)
CO2: 27 mmol/L (ref 22–32)
Calcium: 9.6 mg/dL (ref 8.9–10.3)
Chloride: 107 mmol/L (ref 98–111)
Creatinine, Ser: 1.11 mg/dL — ABNORMAL HIGH (ref 0.44–1.00)
GFR, Estimated: 54 mL/min — ABNORMAL LOW (ref >60)
Glucose, Bld: 96 mg/dL (ref 70–99)
Potassium: 3.4 mmol/L — ABNORMAL LOW (ref 3.5–5.1)
Sodium: 143 mmol/L (ref 135–145)
Total Bilirubin: 1.3 mg/dL — ABNORMAL HIGH (ref 0.3–1.2)
Total Protein: 6.6 g/dL (ref 6.5–8.1)

## 2020-09-09 LAB — ETHANOL: Alcohol, Ethyl (B): <10 mg/dL (ref <10)

## 2020-09-09 MED ORDER — ROSUVASTATIN CALCIUM 20 MG PO TABS
40.0000 mg | ORAL_TABLET | Freq: Every day | ORAL | Status: DC
  Administered 2020-09-10: 40 mg via ORAL
  Filled 2020-09-09: qty 2

## 2020-09-09 MED ORDER — CEPHALEXIN 500 MG PO CAPS
500.0000 mg | ORAL_CAPSULE | Freq: Four times a day (QID) | ORAL | Status: DC
  Administered 2020-09-09 – 2020-09-10 (×3): 500 mg via ORAL
  Filled 2020-09-09 (×3): qty 1

## 2020-09-09 MED ORDER — QUETIAPINE FUMARATE 25 MG PO TABS
25.0000 mg | ORAL_TABLET | Freq: Every day | ORAL | Status: DC
  Administered 2020-09-09: 25 mg via ORAL
  Filled 2020-09-09: qty 1

## 2020-09-09 MED ORDER — DONEPEZIL HCL 23 MG PO TABS
23.0000 mg | ORAL_TABLET | Freq: Every day | ORAL | Status: DC
  Administered 2020-09-09: 23 mg via ORAL
  Filled 2020-09-09 (×2): qty 1

## 2020-09-09 MED ORDER — QUETIAPINE FUMARATE 50 MG PO TABS: 25.0000 mg | ORAL_TABLET | ORAL | Status: DC

## 2020-09-09 MED ORDER — IOHEXOL 300 MG/ML  SOLN
100.0000 mL | Freq: Once | INTRAMUSCULAR | Status: AC | PRN
  Administered 2020-09-09: 100 mL via INTRAVENOUS

## 2020-09-09 MED ORDER — QUETIAPINE FUMARATE 50 MG PO TABS
50.0000 mg | ORAL_TABLET | Freq: Every day | ORAL | Status: DC
  Administered 2020-09-10: 50 mg via ORAL
  Filled 2020-09-09: qty 1

## 2020-09-09 MED ORDER — ESCITALOPRAM OXALATE 10 MG PO TABS
10.0000 mg | ORAL_TABLET | Freq: Every day | ORAL | Status: DC
  Administered 2020-09-10: 10 mg via ORAL
  Filled 2020-09-09: qty 1

## 2020-09-09 NOTE — ED Provider Notes (Signed)
[facility] DEPT Provider Note   CSN: [id number] Arrival date & time: 09/09/20  1850     History Chief Complaint  Patient presents with  . Altered Mental Status    Marie [name] is a 70 y.o. female.  HPI Patient is here for evaluation of possible UTI and cellulitis of her chest wall.  She was seen recently by her PCP and started on Keflex, for cellulitis of the breast, however she is not taking her antibiotic, Keflex as directed.  Patient is known to have dementia, and frequently "talks to her mirror," and does not understand that she has to take medicine so she spits out the pills.  Patient is unable to give history.  History is from husband who is at the bedside.  Patient has a history of breast cancer with excisional biopsy, with reportedly "they got it all."  She last saw her oncologist about 8 months ago.  Patient's husband states he sees her slapping her  breasts, frequently.  Level 5 caveat-dementia    Past Medical History:  Diagnosis Date  . Dementia ([name])   . Depression   . Diabetes mellitus   . Hypertension   . Thyroid disease   . Type II or unspecified type diabetes mellitus without mention of complication, not stated as uncontrolled    stopped metformin due to diarrhea, no meds now  . Vitamin D deficiency     Patient Active Problem List   Diagnosis Date Noted  . Cellulitis of right knee 09/02/2020  . Intertriginous candidiasis 09/02/2020  . Cellulitis of breast 09/02/2020  . Agitation due to dementia ([name]) 09/02/2020  . Hallucinations 09/02/2020  . History of SCC (squamous cell carcinoma) of skin (chest, 03/2019) 04/22/2020  . Osteopenia 01/21/2020  . Alzheimer's dementia with behavioral disturbance ([name]) 01/21/2020  . Aortic atherosclerosis ([name]) 01/20/2020  . CAD (coronary artery disease) 11/06/2019  . History of NSTEMI - 11/04/19 11/04/2019  . Malignant neoplasm of upper-outer quadrant of right breast in female, estrogen  receptor positive ([place]) 02/13/2018  . CKD stage 3 due to type 2 diabetes mellitus ([name]) 10/04/2017  . BMI 22.0-22.9, adult 05/05/2015  . Generalized anxiety disorder 08/04/2014  . Essential hypertension 04/28/2014  . Medication management 04/28/2014  . Vitamin D deficiency 10/04/2013  . Type 2 diabetes mellitus with stage 3a chronic kidney disease, without long-term current use of insulin ([name])   . Depression, major, recurrent, in partial remission ([name])   . SDAT    . Hyperlipidemia associated with type 2 diabetes mellitus ([name]) 07/28/2007  . Allergic rhinitis 07/28/2007    Past Surgical History:  Procedure Laterality Date  . BREAST LUMPECTOMY WITH RADIOACTIVE SEED AND SENTINEL LYMPH NODE BIOPSY Right 03/08/2018   Procedure: BREAST LUMPECTOMY WITH RADIOACTIVE SEED AND SENTINEL LYMPH NODE BIOPSY;  Surgeon: [name], MD;  Location: [facility];  Service: General;  Laterality: Right;  . CARDIAC CATHETERIZATION  11/05/2019  . FRACTURE SURGERY  2009   right arm; rod  . LEFT HEART CATH AND CORONARY ANGIOGRAPHY N/A 11/05/2019   Procedure: LEFT HEART CATH AND CORONARY ANGIOGRAPHY;  Surgeon: [name], MD;  Location: [facility];  Service: Cardiovascular;  Laterality: N/A;  . TOTAL KNEE ARTHROPLASTY  june 2013   left     OB History   No obstetric history on file.     Family History  Problem Relation Age of Onset  . Emphysema Mother   . Breast cancer Mother   .  Dementia Father   . Kidney failure Father   . Colon cancer Neg Hx   . Stomach cancer Neg Hx   . Ovarian cancer Neg Hx   . Prostate cancer Neg Hx     Social History   Tobacco Use  . Smoking status: Never Smoker  . Smokeless tobacco: Never Used  Vaping Use  . Vaping Use: Never used  Substance Use Topics  . Alcohol use: No  . Drug use: No    Home Medications Prior to Admission medications   Medication Sig Start Date End Date Taking? Authorizing Provider  cephALEXin (KEFLEX)  500 MG capsule Take 1 capsule (500 mg total) by mouth 2 (two) times daily for 7 days. 09/04/20 09/11/20 Yes Liane Comber, NP  donepezil (ARICEPT) 23 MG TABS tablet Take 1 tablet daily at bedtime for Memory 09/02/20  Yes Liane Comber, NP  escitalopram (LEXAPRO) 10 MG tablet Take 1 tablet (10 mg total) by mouth daily. 09/02/20 09/02/21 Yes Liane Comber, NP  QUEtiapine (SEROQUEL) 25 MG tablet Take 25-50 mg by mouth See admin instructions. 50 MG at dinner and 25MG at bedtime   Yes [provider]  rosuvastatin (CRESTOR) 40 MG tablet Take one tablet daily for cholesterol Patient taking differently: Take 40 mg by mouth daily. for cholesterol 01/22/20  Yes [name], [name], NP  Blood Glucose Monitoring Suppl (ONETOUCH VERIO) w/Device KIT 1 kit by Does not apply route as needed. Use to check glucose QD DX-E11.22 Patient not taking: No sig reported 05/25/17   [name], MD  glucose blood test strip CHECK BLOOD SUGAR 1 TIME DAILY-DX e11.22 Patient not taking: No sig reported 06/13/17   Liane Comber, NP  nitroGLYCERIN (NITROSTAT) 0.4 MG SL tablet Place 1 tablet (0.4 mg total) under the tongue every 5 (five) minutes x 3 doses as needed for chest pain. Patient not taking: No sig reported 11/06/19   [name], NP  ONE [facility] LANCETS MISC Use as directed to check glucose daily. UL-A45.36 Patient not taking: No sig reported 06/13/17   Liane Comber, NP    Allergies    Penicillins  Review of Systems   Review of Systems  Unable to perform ROS: Dementia    Physical Exam Updated Vital Signs BP (!) 105/56   Pulse (!) 54   Temp 98 F (36.7 C) (Oral)   Resp 18   SpO2 97%   Physical Exam Vitals and nursing note reviewed.  Constitutional:      Appearance: She is well-developed.  HENT:     Head: Normocephalic and atraumatic.     Right Ear: External ear normal.     Left Ear: External ear normal.  Eyes:     Conjunctiva/sclera: Conjunctivae normal.     Pupils: Pupils are equal,  round, and reactive to light.  Neck:     Trachea: Phonation normal.  Cardiovascular:     Rate and Rhythm: Normal rate and regular rhythm.     Heart sounds: Normal heart sounds.  Pulmonary:     Effort: Pulmonary effort is normal.     Breath sounds: Normal breath sounds.  Chest:       Comments: Right breast, labeled as 1, with inferior erythema, area also notable for nodular consistency of subcutaneous tissue and a small dime sized area of brown skin discoloration.  There is also an evident prior surgical scar at this site.  Left breast labeled as #2, with small area of erythema but no subcutaneous skin abnormalities.  Neither breast has  areas of drainage, bleeding, or tenderness.  There is no nipple discharge. Abdominal:     Palpations: Abdomen is soft.     Tenderness: There is no abdominal tenderness.  Musculoskeletal:        General: Normal range of motion.     Cervical back: Normal range of motion and neck supple.  Skin:    General: Skin is warm and dry.  Neurological:     Mental Status: She is alert and oriented to person, place, and time.     Cranial Nerves: No cranial nerve deficit.     Sensory: No sensory deficit.     Motor: No abnormal muscle tone.     Coordination: Coordination normal.  Psychiatric:        Behavior: Behavior normal.        Thought Content: Thought content normal.        Judgment: Judgment normal.     ED Results / Procedures / Treatments   Labs (all labs ordered are listed, but only abnormal results are displayed) Labs Reviewed  COMPREHENSIVE METABOLIC PANEL - Abnormal; Notable for the following components:      Result Value   Potassium 3.4 (*)    BUN 26 (*)    Creatinine, Ser 1.11 (*)    Total Bilirubin 1.3 (*)    GFR, Estimated 54 (*)    All other components within normal limits  RESP PANEL BY RT-PCR (FLU A&B, COVID) ARPGX2  CBC WITH DIFFERENTIAL/PLATELET  ETHANOL  URINALYSIS, ROUTINE W REFLEX MICROSCOPIC  RAPID URINE DRUG SCREEN, HOSP  PERFORMED    EKG None  Radiology No results found.  Procedures Procedures   Medications Ordered in ED Medications  cephALEXin (KEFLEX) capsule 500 mg (500 mg Oral Given 09/09/20 2348)  donepezil (ARICEPT) tablet 23 mg (23 mg Oral Given 09/09/20 2342)  escitalopram (LEXAPRO) tablet 10 mg (has no administration in time range)  rosuvastatin (CRESTOR) tablet 40 mg (has no administration in time range)  QUEtiapine (SEROQUEL) tablet 50 mg (has no administration in time range)    And  QUEtiapine (SEROQUEL) tablet 25 mg (25 mg Oral Given 09/09/20 2343)  iohexol (OMNIPAQUE) 300 MG/ML solution 100 mL (100 mLs Intravenous Contrast Given 09/09/20 2302)    ED Course  I have reviewed the triage vital signs and the nursing notes.  Pertinent labs & imaging results that were available during my care of the patient were reviewed by me and considered in my medical decision making (see chart for details).  Clinical Course as of 09/09/20 2349  Wed Sep 09, 2020  2348 Patient's daughter now in room at bedside and gives history consistent with what the husband already gave me. [EW]  2349 RBC: 3.94 [EW]    Clinical Course User Index [EW] Daleen Bo, MD   MDM Rules/Calculators/A&P                           Patient Vitals for the past 24 hrs:  BP Temp Temp src Pulse Resp SpO2  09/09/20 2300 -- -- -- (!) 54 18 97 %  09/09/20 2245 -- -- -- 64 16 100 %  09/09/20 2230 (!) 105/56 -- -- 65 19 99 %  09/09/20 2215 -- -- -- 60 15 98 %  09/09/20 2200 110/69 -- -- 70 16 99 %  09/09/20 2145 -- -- -- 62 13 98 %  09/09/20 2130 112/64 -- -- (!) 57 15 97 %  09/09/20 2115 112/60 -- --  60 16 98 %  09/09/20 2114 112/60 -- -- 73 15 98 %  09/09/20 2030 -- -- -- 64 -- 100 %  09/09/20 2015 -- -- -- 61 -- 100 %  09/09/20 2000 117/62 -- -- 63 18 100 %  09/09/20 1945 -- -- -- 65 -- 100 %  09/09/20 1904 (!) 121/57 98 F (36.7 C) Oral 72 16 100 %    11:22 PM Reevaluation with update and discussion. After  initial assessment and treatment, an updated evaluation reveals no change in clinical status. Daleen Bo   Medical Decision Making:  This patient is presenting for evaluation of abnormal behavior and possible cellulitis of the chest wall/breasts, which does require a range of treatment options, and is a complaint that involves a moderate risk of morbidity and mortality. The differential diagnoses include cellulitis, metabolic disorder, substance abuse. I decided to review old records, and in summary elderly female presenting with ongoing symptoms of behavioral disorder and possible cellulitis.  I obtained additional historical information from husband and daughter at bedside.  Clinical Laboratory Tests Ordered, included CBC, Metabolic panel, Urinalysis and Alcohol level, drug screen, Covid and flu test. Review indicates initial findings essentially normal. Radiologic Tests Ordered, included CT head to evaluate for possible metastatic cancer.  I independently Visualized: CT images, which show no acute abnormality   Critical Interventions-evaluation, laboratory testing, CT imaging observation reassessment  After These Interventions, the Patient was reevaluated and was found clear for evaluation by TTS for possible neuropsychiatric treatment.  Patient with nonspecific breast abnormality without significant evidence for cellulitis.  She has apparently been prescribed Keflex for that but is not taking it.  This may be self-inflicted trauma associated with her dementia.  However since she has a history of breast cancer she will need to have evaluation for cancer recurrence with breast imaging, likely to start with ultrasound.  This can be done expectantly, after her acute dementia is evaluated by the psychiatric service.  CRITICAL CARE-no Performed by: Daleen Bo  Nursing Notes Reviewed/ Care Coordinated Applicable Imaging Reviewed Interpretation of Laboratory Data incorporated into ED  treatment  TTS consult  Disposition by oncoming provider team in conjunction with TTS consult.  Recommend outpatient follow-up with breast center for evaluation for possible cancer recurrence.    Final Clinical Impression(s) / ED Diagnoses Final diagnoses:  Dementia with behavioral disturbance, unspecified dementia type ([place])  Breast mass    Rx / DC Orders ED Discharge Orders    None       Daleen Bo, MD 09/10/20 1113

## 2020-09-09 NOTE — ED Triage Notes (Addendum)
Pt directed to ER by PCP. Pt given rx for possible UTI and cellulitis under breast. Per family member pt was talking to mirror and has been unable to change/ bathe for 1 month. Chewing up keflex and spitting them out.

## 2020-09-09 NOTE — ED Notes (Signed)
Placed pt on a purewick.

## 2020-09-09 NOTE — Telephone Encounter (Signed)
Patient's daughter, Crystal called. Very concerned about her mother and her behavior getting worse. States that her mom thinks we are trying to kill her with the antibiotics that she was prescribed. Has defecated in her bed and is unable to get her to take a  shower. They don't know what to do with her. She is getting violent.  Provider aware and advised that Maresha go to the ER. Her daughter agreed.

## 2020-09-10 ENCOUNTER — Emergency Department (HOSPITAL_COMMUNITY): Payer: Medicare PPO

## 2020-09-10 DIAGNOSIS — R531 Weakness: Secondary | ICD-10-CM | POA: Diagnosis not present

## 2020-09-10 LAB — RAPID URINE DRUG SCREEN, HOSP PERFORMED
Amphetamines: NOT DETECTED
Barbiturates: NOT DETECTED
Benzodiazepines: NOT DETECTED
Cocaine: NOT DETECTED
Opiates: NOT DETECTED
Tetrahydrocannabinol: NOT DETECTED

## 2020-09-10 LAB — URINALYSIS, ROUTINE W REFLEX MICROSCOPIC
Bacteria, UA: NONE SEEN
Bilirubin Urine: NEGATIVE
Glucose, UA: NEGATIVE mg/dL
Ketones, ur: NEGATIVE mg/dL
Nitrite: NEGATIVE
Protein, ur: NEGATIVE mg/dL
Specific Gravity, Urine: 1.043 — ABNORMAL HIGH (ref 1.005–1.030)
pH: 6 (ref 5.0–8.0)

## 2020-09-10 LAB — RESP PANEL BY RT-PCR (FLU A&B, COVID) ARPGX2
Influenza A by PCR: NEGATIVE
Influenza B by PCR: NEGATIVE
SARS Coronavirus 2 by RT PCR: NEGATIVE

## 2020-09-10 MED ORDER — LORAZEPAM 2 MG/ML IJ SOLN
1.0000 mg | Freq: Once | INTRAMUSCULAR | Status: AC
  Administered 2020-09-10: 1 mg via INTRAVENOUS
  Filled 2020-09-10: qty 1

## 2020-09-10 MED ORDER — ZIPRASIDONE MESYLATE 20 MG IM SOLR
10.0000 mg | Freq: Once | INTRAMUSCULAR | Status: AC
  Administered 2020-09-10: 10 mg via INTRAMUSCULAR
  Filled 2020-09-10: qty 20

## 2020-09-10 MED ORDER — STERILE WATER FOR INJECTION IJ SOLN
INTRAMUSCULAR | Status: AC
  Administered 2020-09-10: 1.2 mL
  Filled 2020-09-10: qty 10

## 2020-09-10 MED ORDER — SODIUM CHLORIDE 0.9 % IV BOLUS
500.0000 mL | Freq: Once | INTRAVENOUS | Status: AC
  Administered 2020-09-10: 500 mL via INTRAVENOUS

## 2020-09-10 NOTE — BH Assessment (Addendum)
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  None   Duration of Depressive symptoms: Greater than two weeks   Mania:  None   Anxiety:   None   Psychosis:  None   Duration of Psychotic symptoms: No data recorded  Trauma:  None   Obsessions:  Poor insight; Cause anxiety   Compulsions:  None   Inattention:  Disorganized   Hyperactivity/Impulsivity:  N/A   Oppositional/Defiant Behaviors:  None   Emotional Irregularity:  None   Other Mood/Personality Symptoms:  Refuses to take UTI antibiotics due to the size of pills; Talking to self in mirror, Refusing to take bathes    Mental Status Exam Appearance and self-care  Stature:  Average   Weight:  Thin   Clothing:   Neat/clean   Grooming:  Normal   Cosmetic use:  None   Posture/gait:  Normal   Motor activity:  Not Remarkable   Sensorium  Attention:  Normal   Concentration:  Normal   Orientation:  X5   Recall/memory:  Normal   Affect and Mood  Affect:  Flat   Mood:  Anxious   Relating  Eye contact:  Fleeting   Facial expression:  Depressed   Attitude toward examiner:  Cooperative; Guarded   Thought and Language  Speech flow: Clear and Coherent   Thought content:  Delusions; Persecutions   Preoccupation:  Obsessions (related to running water)   Hallucinations:  None   Organization:  No data recorded  [employer] of Knowledge:  Poor   Intelligence:  Average   Abstraction:  Normal   Judgement:  Impaired   Reality Testing:  Distorted   Insight:  Lacking   Decision Making:  Confused   Social Functioning  Social Maturity:  -- (n/a)   Social Judgement:  Normal   Stress  Stressors:  Other (Comment) (symptoms related to dementia diagnosis)   Coping Ability:  Normal   Skill Deficits:  Communication   Supports:  Family     Religion: Religion/Spirituality Are You A Religious Person?:  (unk) How Might This Affect Treatment?: unk  Leisure/Recreation: Leisure / Recreation Do You Have Hobbies?:  (unk)  Exercise/Diet: Exercise/Diet Do You Exercise?:  (unk) Have You Gained or Lost A Significant Amount of Weight in the Past Six Months?:  (unk) Do You Follow a Special Diet?:  (unk) Do You Have Any Trouble Sleeping?: Yes Explanation of Sleeping Difficulties: wakes up early (6am) in the past 2-3 months   CCA Employment/Education Employment/Work Situation: Employment / Work Situation Employment situation: Unemployed Patient's job has been impacted by current illness: No What is the longest time patient has a held a job?: unk 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 Has patient ever been in the [employer]?:  No  Education: Education Is Patient Currently Attending School?: No Last  Grade Completed:  (unk) Name of High School: unk Did You Graduate From [employer]?: No Did You Attend College?: No Did [place]?: No Did You Have Any Special Interests In School?: unk Did You Have An Individualized Education Program (IIEP): No Did You Have Any Difficulty At School?: No Patient's Education Has Been Impacted by Current Illness: No   CCA Family/Childhood History Family and Relationship History: Family history Marital status: Married Number of Years Married:  (unk) What types of issues is patient dealing with in the relationship?: unk Additional relationship information: unk Are you sexually active?:  (unk) What is your sexual orientation?: unk Has your sexual activity been affected by drugs, alcohol, medication, or emotional stress?: unk Does patient have children?: Yes (unk) How many children?:  (patient reports having 3 children) How is patient's relationship with their children?: daugher lives with her and is her care taker; relationship unk with the other children  Childhood History:  Childhood History By whom was/is the patient raised?: Other (Comment) (unk) Additional childhood history information: unk Description of patient's relationship with caregiver when they were a child: unk Patient's description of current relationship with people who raised him/her: unk How were you disciplined when you got in trouble as a child/adolescent?: unk Does patient have siblings?:  (unk) Did patient suffer any verbal/emotional/physical/sexual abuse as a child?:  (unk) Did patient suffer from severe childhood neglect?:  (unk) Has patient ever been sexually abused/assaulted/raped as an adolescent or adult?:  (unk) Was the patient ever a victim of a crime or a disaster?:  (unk) Witnessed domestic violence?:  (unk) Has patient been affected by domestic violence as an adult?:   (unk)  Child/Adolescent Assessment:     CCA Substance Use Alcohol/Drug Use: Alcohol / Drug Use Pain Medications: SEE MAR Prescriptions: SEE MAR Over the Counter: SEE MAR History of alcohol / drug use?: No history of alcohol / drug abuse Longest period of sobriety (when/how long): n/a                         ASAM's:  Six Dimensions of Multidimensional Assessment  Dimension 1:  Acute Intoxication and/or Withdrawal Potential:      Dimension 2:  Biomedical Conditions and Complications:      Dimension 3:  Emotional, Behavioral, or Cognitive Conditions and Complications:     Dimension 4:  Readiness to Change:     Dimension 5:  Relapse, Continued use, or Continued Problem Potential:     Dimension 6:  Recovery/Living Environment:     ASAM Severity Score:    ASAM Recommended Level of Treatment:     Substance use Disorder (SUD)    Recommendations for Services/Supports/Treatments:    DSM5 Diagnoses: Patient Active Problem List   Diagnosis Date Noted  . Cellulitis of right knee 09/02/2020  . Intertriginous candidiasis 09/02/2020  . Cellulitis of breast 09/02/2020  . 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 Hallucinations 09/02/2020  . History of SCC (squamous cell carcinoma) of skin (chest, 03/2019) 04/22/2020  . Osteopenia 01/21/2020  . Alzheimer's dementia with behavioral disturbance ([place]) 01/21/2020  . Aortic atherosclerosis ([place]) 01/20/2020  . CAD (coronary artery disease) 11/06/2019  . History of NSTEMI - 11/04/19 11/04/2019  . Malignant neoplasm of upper-outer quadrant of right breast in female, estrogen receptor positive ([place]) 02/13/2018  . CKD stage 3 due to type 2 diabetes mellitus ([place]) 10/04/2017  . BMI 22.0-22.9, adult 05/05/2015  . Generalized anxiety disorder 08/04/2014  .  Essential hypertension 04/28/2014  . Medication management 04/28/2014  . Vitamin D deficiency 10/04/2013  . Type 2 diabetes mellitus with stage 3a chronic kidney disease,  without long-term current use of insulin ([place])   . Depression, major, recurrent, in partial remission ([place])   . SDAT    . Hyperlipidemia associated with type 2 diabetes mellitus ([place]) 07/28/2007  . Allergic rhinitis 07/28/2007    Patient Centered Plan: Patient is on the following Treatment Plan(s):  Anxiety and Depression   Referrals to Alternative Service(s): Referred to Alternative Service(s):   Place:   Date:   Time:    Referred to Alternative Service(s):   Place:   Date:   Time:    Referred to Alternative Service(s):   Place:   Date:   Time:    Referred to Alternative Service(s):   Place:   Date:   Time:     Marie [name], Marie [name]

## 2020-09-10 NOTE — Discharge Instructions (Addendum)
Please crush up the antibiotic pills like we showed and mix with applesauce or other food when giving it.

## 2020-09-10 NOTE — ED Notes (Signed)
Pt daughter, Nhu Glasby, at bedside. Requesting an update on her mother's status. I asked the patient if I could discuss her medical information with her daughter, and she said yes. Gave her an update. Also, gave her a pill crusher and recommended she crush her mother's medications and mix them in applesauce or food.

## 2020-09-10 NOTE — BH Assessment (Addendum)
Requested TTS machine to be set up in patient's room for initial assessment. Sent message to [place], RN via secure chat.

## 2020-09-10 NOTE — BH Assessment (Addendum)
Patient is psych cleared by psychiatry Letitia Libra, NP). Patient to follow up with PCP regarding medication concerns (patient refusing to take antibiotic pills due to size)...or ED provider/Nurse to provide and education on how to take medications. Patient's nurse Joellen Jersey, NP) and EDP (Dr. Francia Greaves) provided updates.

## 2020-09-10 NOTE — ED Provider Notes (Signed)
Patient recommended for outpatient psychiatric treatment per psych team evaluation.  Family at bedside now.  Nursing showed them how to crush her pills and mix with applesauce or other food.  Okay for discharge.   Wyvonnia Dusky, MD 09/10/20 Tresa Moore

## 2020-09-11 ENCOUNTER — Other Ambulatory Visit: Payer: Self-pay | Admitting: Adult Health

## 2020-09-11 LAB — URINE CULTURE: Culture: NO GROWTH

## 2020-09-16 ENCOUNTER — Ambulatory Visit: Payer: Medicare PPO | Admitting: Adult Health

## 2020-09-17 ENCOUNTER — Other Ambulatory Visit: Payer: Self-pay | Admitting: Adult Health

## 2020-10-13 ENCOUNTER — Other Ambulatory Visit: Payer: Self-pay | Admitting: Adult Health

## 2020-10-18 ENCOUNTER — Encounter: Payer: Self-pay | Admitting: Internal Medicine

## 2020-10-18 NOTE — Progress Notes (Deleted)
Future Appointments  Date Time Provider [place]  10/19/2020 11:00 AM [name], MD  - > No Showed  [facility] None  10/20/2020  9:00 AM Penumalli, Earlean Polka, MD [facility] None  01/27/2021  2:30 PM Liane Comber, NP - 6 month Wellness w/[facility]  [facility] None  10/20/2021  3:00 PM [name], MD [facility] None        This very nice 70 y.o. MWF presents for a Screening /Preventative Visit & comprehensive evaluation and management of multiple medical co-morbidities.  Patient has been followed for HTN, HLD, T2_NIDDM  and Vitamin D Deficiency. Aortic Atherosclerosis was found on CTA scan on 11/04/2019.      She has been on SS Disability since 2015 for Depression & Dementia - Dr Layla Barter.  She is followed  by Dr Leta Baptist since 2012 for her Dementia. Patient requires has requied moderate supervision /direction  by her caretaker husband and her children. Patient gets agitated & lost in her own house.   Husband transports,  manages meds, cleans house & laundry, prepares meals, manages finances & supervises patient's hygiene and ADL's.        HTN predates since 2012. Patient's BP has been controlled at home. In May 2021,  patient had a heart cath which showed mild nonobstructive CAD with normal LV systolic function with no further ischemic work-up was recommended.  Patient denies any cardiac symptoms as chest pain, palpitations, shortness of breath, dizziness or ankle swelling. Today's         Patient's hyperlipidemia is controlled with diet and Crestor. Patient denies myalgias or other medication SE's. Last lipids were at goal:  Lab Results  Component Value Date   CHOL 166 01/21/2020   HDL 64 01/21/2020   LDLCALC 85 01/21/2020   TRIG 76 01/21/2020   CHOLHDL 2.6 01/21/2020        Patient has hx/o T2_NIDDM (2000) w/CKD3b  (GFR 43)  and patient denies reactive hypoglycemic symptoms, visual blurring, diabetic polys or paresthesias. Last A1c was not at goal:  Lab Results   Component Value Date   HGBA1C 6.2 (H) 01/21/2020                                          Patient has been on thyroid replacement therapy since she was dx'd Hypothyroid in 2010.       Finally, patient has history of Vitamin D Deficiency ("29" /2008) and last Vitamin D was still very low (goal 70-100):  Lab Results  Component Value Date   VD25OH 20 (L) 01/21/2020    Current Outpatient Medications on File Prior to Visit  Medication Sig  . donepezil /ARICEPT 23 MG TABS tablet Take 1 tablet daily at bedtime for Memory  . escitalopram  10 MG tablet Take 1 tablet daily.  . QUEtiapine /SEROQUEL 25 MG tablet Take  1 tablet  at Suppertime  and may repeat in 2-4 hours  up to 4 tabs /24 hours if needed for Agitation  . rosuvastatin  40 MG tablet Take  1 tablet  Daily  for Cholesterol       Last Colon -  04/25/2012 - Dr Deatra Ina - recc 7 yr f/u due Nov 2020  Cologard was ordered in July 2021  & was never returned  Last [facility] -  an  1. Annual Preventative Screening Examination 2. Essential hypertension - EKG 12-Lead - CBC with  Differential/Platelet - COMPLETE METABOLIC PANEL WITH GFR - Magnesium - TSH 3. Hyperlipidemia associated with type 2 diabetes mellitus ([place]) - EKG 12-Lead - Lipid panel - TSH 4. Type 2 diabetes mellitus with stage 3a chronic kidney Disease, without long-term current use of insulin (HCC) - EKG 12-Lead - HM DIABETES FOOT EXAM - LOW EXTREMITY NEUR EXAM DOCUM - Hemoglobin A1c - Insulin, random 5. Vitamin D deficiency - VITAMIN D 25 Hydroxy 6. Vascular dementia with behavior disturbance ([place]) 7. History of NSTEMI - 11/04/19 - EKG 12-Lead - Lipid panel 8. Aortic atherosclerosis ([place]) by CTA scan on 11/04/2019 - EKG 12-Lead - Lipid panel 9. Screening for colon cancer - Cologuard 10. Screening for ischemic heart disease - EKG 12-Lead 11. FHx: heart disease - EKG 12-Lead 12. Vitamin B12 deficiency - Vitamin B12 13. Medication management - Vitamin  B12 - CBC with Differential/Platelet - COMPLETE METABOLIC PANEL WITH GFR - Magnesium - Lipid panel - TSH - Hemoglobin A1c - Insulin, random - VITAMIN D 25 Hydroxy

## 2020-10-19 ENCOUNTER — Ambulatory Visit: Payer: Medicare PPO | Admitting: Internal Medicine

## 2020-10-19 DIAGNOSIS — Z136 Encounter for screening for cardiovascular disorders: Secondary | ICD-10-CM

## 2020-10-19 DIAGNOSIS — E1169 Type 2 diabetes mellitus with other specified complication: Secondary | ICD-10-CM

## 2020-10-19 DIAGNOSIS — E538 Deficiency of other specified B group vitamins: Secondary | ICD-10-CM

## 2020-10-19 DIAGNOSIS — F0151 Vascular dementia with behavioral disturbance: Secondary | ICD-10-CM

## 2020-10-19 DIAGNOSIS — Z79899 Other long term (current) drug therapy: Secondary | ICD-10-CM

## 2020-10-19 DIAGNOSIS — I700 Atherosclerosis of aorta: Secondary | ICD-10-CM

## 2020-10-19 DIAGNOSIS — Z8249 Family history of ischemic heart disease and other diseases of the circulatory system: Secondary | ICD-10-CM

## 2020-10-19 DIAGNOSIS — E1122 Type 2 diabetes mellitus with diabetic chronic kidney disease: Secondary | ICD-10-CM

## 2020-10-19 DIAGNOSIS — E559 Vitamin D deficiency, unspecified: Secondary | ICD-10-CM

## 2020-10-19 DIAGNOSIS — Z1211 Encounter for screening for malignant neoplasm of colon: Secondary | ICD-10-CM

## 2020-10-19 DIAGNOSIS — Z0001 Encounter for general adult medical examination with abnormal findings: Secondary | ICD-10-CM

## 2020-10-19 DIAGNOSIS — I10 Essential (primary) hypertension: Secondary | ICD-10-CM

## 2020-10-20 ENCOUNTER — Encounter: Payer: Self-pay | Admitting: Diagnostic Neuroimaging

## 2020-10-20 ENCOUNTER — Institutional Professional Consult (permissible substitution): Payer: Medicare PPO | Admitting: Diagnostic Neuroimaging

## 2020-10-20 ENCOUNTER — Telehealth: Payer: Self-pay | Admitting: *Deleted

## 2020-10-20 NOTE — Telephone Encounter (Signed)
Patient was no show for new patient appointment today. 

## 2020-11-17 ENCOUNTER — Other Ambulatory Visit: Payer: Self-pay

## 2020-11-17 ENCOUNTER — Ambulatory Visit (INDEPENDENT_AMBULATORY_CARE_PROVIDER_SITE_OTHER): Payer: Medicare PPO | Admitting: Internal Medicine

## 2020-11-17 VITALS — BP 106/66 | HR 72 | Temp 97.5°F | Resp 16 | Ht 64.5 in | Wt 128.7 lb

## 2020-11-17 DIAGNOSIS — F0151 Vascular dementia with behavioral disturbance: Secondary | ICD-10-CM

## 2020-11-17 DIAGNOSIS — E785 Hyperlipidemia, unspecified: Secondary | ICD-10-CM

## 2020-11-17 DIAGNOSIS — C50411 Malignant neoplasm of upper-outer quadrant of right female breast: Secondary | ICD-10-CM | POA: Diagnosis not present

## 2020-11-17 DIAGNOSIS — I700 Atherosclerosis of aorta: Secondary | ICD-10-CM

## 2020-11-17 DIAGNOSIS — E1169 Type 2 diabetes mellitus with other specified complication: Secondary | ICD-10-CM | POA: Diagnosis not present

## 2020-11-17 DIAGNOSIS — Z170 Estrogen receptor positive status [ER+]: Secondary | ICD-10-CM

## 2020-11-17 DIAGNOSIS — E1122 Type 2 diabetes mellitus with diabetic chronic kidney disease: Secondary | ICD-10-CM | POA: Diagnosis not present

## 2020-11-17 DIAGNOSIS — F01518 Vascular dementia, unspecified severity, with other behavioral disturbance: Secondary | ICD-10-CM

## 2020-11-17 DIAGNOSIS — N1831 Chronic kidney disease, stage 3a: Secondary | ICD-10-CM

## 2020-11-17 NOTE — Progress Notes (Deleted)
Future Appointments  Date Time Provider [place]  11/17/2020  4:00 PM [name], MD [facility] None  01/01/2021 11:00 AM Penumalli, Earlean Polka, MD [facility] None  01/27/2021  2:30 PM Liane Comber, NP [facility] None    History of Present Illness:       This very nice 70 y.o. MWF presents for belated  follow up  for HTN, HLD, T2_NIDDM  and Vitamin D Deficiency. Aortic Atherosclerosis was found on CTA scan on 11/04/2019. Patient also has hx/o Breast Ca - upper outer quad Lt Breast.       Patient  has been on SS Disability since 2015 for Depression & Dementia - Dr Layla Barter.  She is followed  by Dr Leta Baptist since 2012 for her Dementia. Patient requires has requiedmoderate supervision /direction  by her caretaker husband and her children. Patient gets agitated & lost in her own house.  Husband transports,  manages meds, cleans house & laundry, prepares meals, manages finances & supervises patient's hygiene and ADL's.       HTN predates since 2012  & BP has been controlled at home. Today's BP is at goal - 106/66. In May 2021,  patient had a heart cath which showed mild nonobstructive CAD with normal LV systolic function with no further ischemic work-up was recommended. Patient has had no complaints of any cardiac type chest pain, palpitations, dyspnea / orthopnea / PND, dizziness, claudication, or dependent edema.       Hyperlipidemia is controlled with diet & Rosuvastatin. Patient denies myalgias or other med SE's. Last Lipids were  Lab Results  Component Value Date   CHOL 166 01/21/2020   HDL 64 01/21/2020   LDLCALC 85 01/21/2020   TRIG 76 01/21/2020   CHOLHDL 2.6 01/21/2020     Also, the patient has hx/o T2_NIDDM (2000) w/CKD3b  (GFR 43) and has had no symptoms of reactive hypoglycemia, diabetic polys, paresthesias or visual blurring.  Last A1c was  not at goal:  Lab Results  Component Value Date   HGBA1C 6.2 (H) 01/21/2020       Patient has been on thyroid replacement therapy since she was dx'd Hypothyroid in 2010.          Further, patient has history of Vitamin D Deficiency ("29" /2008) and last Vitamin D was still very low (goal 70-100):  Lab Results  Component Value Date   VD25OH 20 (L) 01/21/2020     Current Outpatient Medications on File Prior to Visit  Medication Sig  . Blood Glucose Monitoring Suppl (ONETOUCH VERIO) w/Device KIT 1 kit by Does not apply route as needed. Use to check glucose QD [id number]  . donepezil (ARICEPT) 23 MG TABS tablet Take 1 tablet daily at bedtime for Memory  . escitalopram (LEXAPRO) 10 MG tablet Take 1 tablet (10 mg total) by mouth daily.  Marland Kitchen glucose blood test strip CHECK BLOOD SUGAR 1 TIME DAILY-DX e11.22  . QUEtiapine (SEROQUEL) 25 MG tablet Take  1 tablet  at Suppertime  and may repeat in 2-4 hours  up to 4 tabs /24 hours if needed for Agitation  . rosuvastatin (CRESTOR) 40 MG tablet Take  1 tablet  Daily  for Cholesterol     Allergies  Allergen Reactions  . Penicillins Rash    Swelling in face - last 10 years     PMHx:   Past Medical History:  Diagnosis Date  . Dementia ([place])   . Depression   . Diabetes mellitus   .  Hypertension   . Thyroid disease   . Type II or unspecified type diabetes mellitus without mention of complication, not stated as uncontrolled    stopped metformin due to diarrhea, no meds now  . Vitamin D deficiency      Immunization History  Administered Date(s) Administered  . DT (Pediatric) 11/25/2005, 08/11/2015  . Influenza, High Dose Seasonal PF 03/28/2017, 07/25/2018, 03/28/2019  . Pneumococcal Conjugate-13 08/04/2014  . Pneumococcal Polysaccharide-23 03/28/1999, 03/28/2017  . Zoster 04/28/2014     Past Surgical History:  Procedure Laterality Date  . BREAST LUMPECTOMY WITH RADIOACTIVE SEED AND SENTINEL LYMPH NODE BIOPSY Right 03/08/2018   Procedure: BREAST LUMPECTOMY WITH  RADIOACTIVE SEED AND SENTINEL LYMPH NODE BIOPSY;  Surgeon: [name], MD;  Location: [facility];  Service: General;  Laterality: Right;  . CARDIAC CATHETERIZATION  11/05/2019  . FRACTURE SURGERY  2009   right arm; rod  . LEFT HEART CATH AND CORONARY ANGIOGRAPHY N/A 11/05/2019   Procedure: LEFT HEART CATH AND CORONARY ANGIOGRAPHY;  Surgeon: [name], MD;  Location: [facility];  Service: Cardiovascular;  Laterality: N/A;  . TOTAL KNEE ARTHROPLASTY  june 2013   left    FHx:    Reviewed / unchanged  SHx:    Reviewed / unchanged   Systems Review:  Constitutional: Denies fever, chills, wt changes, headaches, insomnia, fatigue, night sweats, change in appetite. Eyes: Denies redness, blurred vision, diplopia, discharge, itchy, watery eyes.  ENT: Denies discharge, congestion, post nasal drip, epistaxis, sore throat, earache, hearing loss, dental pain, tinnitus, vertigo, sinus pain, snoring.  CV: Denies chest pain, palpitations, irregular heartbeat, syncope, dyspnea, diaphoresis, orthopnea, PND, claudication or edema. Respiratory: denies cough, dyspnea, DOE, pleurisy, hoarseness, laryngitis, wheezing.  Gastrointestinal: Denies dysphagia, odynophagia, heartburn, reflux, water brash, abdominal pain or cramps, nausea, vomiting, bloating, diarrhea, constipation, hematemesis, melena, hematochezia  or hemorrhoids. Genitourinary: Denies dysuria, frequency, urgency, nocturia, hesitancy, discharge, hematuria or flank pain. Musculoskeletal: Denies arthralgias, myalgias, stiffness, jt. swelling, pain, limping or strain/sprain.  Skin: Denies pruritus, rash, hives, warts, acne, eczema or change in skin lesion(s). Neuro: No weakness, tremor, incoordination, spasms, paresthesia or pain. Psychiatric: Denies confusion, memory loss or sensory loss. Endo: Denies change in weight, skin or hair change.  Heme/Lymph: No excessive bleeding, bruising or enlarged lymph  nodes.  Physical Exam  BP 106/66   Pulse 72   Temp (!) 97.5 F (36.4 C)   Resp 16   Ht 5' 4.5" (1.638 m)   Wt 128 lb 11.2 oz (58.4 kg)   SpO2 99%   BMI 21.75 kg/m   Appears  Over nourished, well groomed  and in no distress.  Eyes: PERRLA, EOMs, conjunctiva no swelling or erythema. Sinuses: No frontal/maxillary tenderness ENT/Mouth: EAC's clear, TM's nl w/o erythema, bulging. Nares clear w/o erythema, swelling, exudates. Oropharynx clear without erythema or exudates. Oral hygiene is good. Tongue normal, non obstructing. Hearing intact.  Neck: Supple. Thyroid not palpable. Car 2+/2+ without bruits, nodes or JVD. Chest: Respirations nl with BS clear & equal w/o rales, rhonchi, wheezing or stridor.  Cor: Heart sounds normal w/ regular rate and rhythm without sig. murmurs, gallops, clicks or rubs. Peripheral pulses normal and equal  without edema.  Abdomen: Soft & bowel sounds normal. Non-tender w/o guarding, rebound, hernias, masses or organomegaly.  Lymphatics: Unremarkable.  Musculoskeletal: Full ROM all peripheral extremities, joint stability, 5/5 strength and normal gait.  Skin: Warm, dry without exposed rashes, lesions or ecchymosis apparent.  Neuro: Cranial nerves intact, reflexes equal  bilaterally. Sensory-motor testing grossly intact. Tendon reflexes grossly intact.  Pysch: Alert & oriented x 0-1.  Insight and judgement nl - very limited & poor   Assessment and Plan:  1. Vascular dementia with behavior disturbance ([place])   2. Type 2 diabetes mellitus with stage 3a chronic kidney disease, without long-term current use of insulin (HCC)  - Continue diet, exercise  - Lifestyle modifications.  - Monitor appropriate labs.  3. Hyperlipidemia associated with type 2 diabetes mellitus ([place])  - Continue monitor periodic cholesterol/liver & renal functions  - Continue diet/meds, exercise,& lifestyle modifications.   4. Malignant neoplasm of upper-outer quadrant of right  breast(HCC) - History   5. Aortic atherosclerosis ([place]) by CTA scan on 11/04/2019          Discussed  regular exercise, BP monitoring, weight control to achieve/maintain BMI less than 25 and discussed med and SE's. Recommended labs to assess and monitor clinical status with further disposition pending results of labs.  I discussed the assessment and treatment plan with the patient. The patient was provided an opportunity to ask questions and all were answered. The patient agreed with the plan and demonstrated an understanding of the instructions.  I provided over 30 minutes of exam, counseling, chart review and  complex critical decision making.        The patient was advised to call back or seek an in-person evaluation if the symptoms worsen or if the condition fails to improve as anticipated.   Kirtland Bouchard, MD

## 2020-11-18 ENCOUNTER — Encounter: Payer: Self-pay | Admitting: Internal Medicine

## 2020-11-24 ENCOUNTER — Ambulatory Visit: Payer: Medicare PPO | Admitting: Neurology

## 2020-12-02 ENCOUNTER — Other Ambulatory Visit: Payer: Self-pay | Admitting: Adult Health

## 2020-12-02 DIAGNOSIS — F0280 Dementia in other diseases classified elsewhere without behavioral disturbance: Secondary | ICD-10-CM

## 2020-12-23 NOTE — Progress Notes (Signed)
89rd     M  I  S  S E  D    A  P  P  ' T       Husband relates that she won't cooperate and bath or get dressed to come for OV. Husband also relates that she won't cooperate in bathing or personal hygiene.  e                                                                                                                                                                                                                                                  This very nice 70 y.o. MWF presents for belated  follow up  for HTN, HLD, T2_NIDDM  and Vitamin D Deficiency. Aortic Atherosclerosis was found on CTA scan on 11/04/2019. Patient also has hx/o Breast Ca - upper outer quad Lt Breast.                                                             Patient  has been on SS Disability since 2015 for Depression & Dementia - Dr Layla Barter.  She is followed  by Dr Leta Baptist since 2012 for her Dementia. Patient requires has requied moderate supervision /direction  by her caretaker husband and her children. Patient gets agitated & lost in her own house.  Husband transports,  manages meds, cleans house & laundry, prepares meals, manages finances & supervises patient's hygiene and ADL's.                                                             HTN predates since 2012  & BP has been controlled at home. Today's BP is at goal - 106/66. In May 2021,  patient had a heart cath which showed mild nonobstructive CAD with normal LV systolic function with no further ischemic work-up was recommended.  Patient has had no complaints of any cardiac type chest pain, palpitations, dyspnea / orthopnea / PND, dizziness, claudication, or dependent edema.                                                             Hyperlipidemia is  controlled with diet & Rosuvastatin. Patient denies myalgias or other med SE's. Last Lipids were        Lab Results  Component Value Date    CHOL 166 01/21/2020    HDL 64 01/21/2020    LDLCALC 85 01/21/2020    TRIG 76 01/21/2020    CHOLHDL 2.6 01/21/2020        Also, the patient has hx/o T2_NIDDM (2000) w/CKD3b  (GFR 43) and has had no symptoms of reactive hypoglycemia, diabetic polys, paresthesias or visual blurring.  Last A1c was  not at goal:        Lab Results  Component Value Date    HGBA1C 6.2 (H) 01/21/2020                                                             Patient has been on thyroid replacement therapy since she was dx'd Hypothyroid in 2010.                                                                                           Further, patient has history of Vitamin D Deficiency ("29" /2008) and last Vitamin D was still very low (goal 70-100):        Lab Results  Component Value Date    VD25OH 20 (L) 01/21/2020

## 2020-12-24 ENCOUNTER — Ambulatory Visit: Payer: Medicare PPO | Admitting: Internal Medicine

## 2021-01-01 ENCOUNTER — Institutional Professional Consult (permissible substitution): Payer: Medicare PPO | Admitting: Diagnostic Neuroimaging

## 2021-01-27 ENCOUNTER — Ambulatory Visit: Payer: Medicare PPO | Admitting: Adult Health

## 2021-01-27 ENCOUNTER — Other Ambulatory Visit: Payer: Self-pay

## 2021-02-25 ENCOUNTER — Other Ambulatory Visit: Payer: Self-pay | Admitting: Internal Medicine

## 2021-07-31 ENCOUNTER — Other Ambulatory Visit: Payer: Self-pay

## 2021-07-31 ENCOUNTER — Inpatient Hospital Stay (HOSPITAL_COMMUNITY)
Admission: EM | Admit: 2021-07-31 | Discharge: 2021-08-13 | DRG: 682 | Disposition: A | Discharge status: Skilled Nursing Facility | Payer: Medicare PPO | Attending: Internal Medicine | Admitting: Internal Medicine

## 2021-07-31 ENCOUNTER — Encounter (HOSPITAL_COMMUNITY): Payer: Self-pay

## 2021-07-31 DIAGNOSIS — K8020 Calculus of gallbladder without cholecystitis without obstruction: Secondary | ICD-10-CM | POA: Diagnosis not present

## 2021-07-31 DIAGNOSIS — E559 Vitamin D deficiency, unspecified: Secondary | ICD-10-CM | POA: Diagnosis present

## 2021-07-31 DIAGNOSIS — Z803 Family history of malignant neoplasm of breast: Secondary | ICD-10-CM

## 2021-07-31 DIAGNOSIS — R4182 Altered mental status, unspecified: Secondary | ICD-10-CM | POA: Diagnosis not present

## 2021-07-31 DIAGNOSIS — B850 Pediculosis due to Pediculus humanus capitis: Secondary | ICD-10-CM | POA: Diagnosis not present

## 2021-07-31 DIAGNOSIS — F0390 Unspecified dementia without behavioral disturbance: Secondary | ICD-10-CM | POA: Diagnosis not present

## 2021-07-31 DIAGNOSIS — G939 Disorder of brain, unspecified: Secondary | ICD-10-CM | POA: Diagnosis not present

## 2021-07-31 DIAGNOSIS — G9341 Metabolic encephalopathy: Secondary | ICD-10-CM | POA: Diagnosis not present

## 2021-07-31 DIAGNOSIS — E860 Dehydration: Secondary | ICD-10-CM | POA: Diagnosis present

## 2021-07-31 DIAGNOSIS — F419 Anxiety disorder, unspecified: Secondary | ICD-10-CM | POA: Diagnosis not present

## 2021-07-31 DIAGNOSIS — Z96652 Presence of left artificial knee joint: Secondary | ICD-10-CM | POA: Diagnosis present

## 2021-07-31 DIAGNOSIS — I2510 Atherosclerotic heart disease of native coronary artery without angina pectoris: Secondary | ICD-10-CM | POA: Diagnosis present

## 2021-07-31 DIAGNOSIS — N281 Cyst of kidney, acquired: Secondary | ICD-10-CM | POA: Diagnosis not present

## 2021-07-31 DIAGNOSIS — G060 Intracranial abscess and granuloma: Secondary | ICD-10-CM | POA: Diagnosis not present

## 2021-07-31 DIAGNOSIS — Z1389 Encounter for screening for other disorder: Secondary | ICD-10-CM | POA: Diagnosis not present

## 2021-07-31 DIAGNOSIS — F03918 Unspecified dementia, unspecified severity, with other behavioral disturbance: Secondary | ICD-10-CM

## 2021-07-31 DIAGNOSIS — Z880 Allergy status to penicillin: Secondary | ICD-10-CM | POA: Diagnosis not present

## 2021-07-31 DIAGNOSIS — I10 Essential (primary) hypertension: Secondary | ICD-10-CM | POA: Diagnosis present

## 2021-07-31 DIAGNOSIS — E1122 Type 2 diabetes mellitus with diabetic chronic kidney disease: Secondary | ICD-10-CM | POA: Diagnosis present

## 2021-07-31 DIAGNOSIS — Z825 Family history of asthma and other chronic lower respiratory diseases: Secondary | ICD-10-CM

## 2021-07-31 DIAGNOSIS — R000 Tachycardia, unspecified: Secondary | ICD-10-CM | POA: Diagnosis not present

## 2021-07-31 DIAGNOSIS — E785 Hyperlipidemia, unspecified: Secondary | ICD-10-CM | POA: Diagnosis present

## 2021-07-31 DIAGNOSIS — Z7189 Other specified counseling: Secondary | ICD-10-CM | POA: Diagnosis not present

## 2021-07-31 DIAGNOSIS — Z20822 Contact with and (suspected) exposure to covid-19: Secondary | ICD-10-CM | POA: Diagnosis present

## 2021-07-31 DIAGNOSIS — N1831 Chronic kidney disease, stage 3a: Secondary | ICD-10-CM | POA: Diagnosis present

## 2021-07-31 DIAGNOSIS — Z515 Encounter for palliative care: Secondary | ICD-10-CM | POA: Diagnosis not present

## 2021-07-31 DIAGNOSIS — Z659 Problem related to unspecified psychosocial circumstances: Secondary | ICD-10-CM | POA: Diagnosis not present

## 2021-07-31 DIAGNOSIS — L299 Pruritus, unspecified: Secondary | ICD-10-CM | POA: Diagnosis present

## 2021-07-31 DIAGNOSIS — Z7401 Bed confinement status: Secondary | ICD-10-CM | POA: Diagnosis not present

## 2021-07-31 DIAGNOSIS — G309 Alzheimer's disease, unspecified: Secondary | ICD-10-CM | POA: Diagnosis not present

## 2021-07-31 DIAGNOSIS — E876 Hypokalemia: Secondary | ICD-10-CM | POA: Diagnosis not present

## 2021-07-31 DIAGNOSIS — N179 Acute kidney failure, unspecified: Secondary | ICD-10-CM | POA: Diagnosis not present

## 2021-07-31 DIAGNOSIS — Z66 Do not resuscitate: Secondary | ICD-10-CM | POA: Diagnosis not present

## 2021-07-31 DIAGNOSIS — J984 Other disorders of lung: Secondary | ICD-10-CM | POA: Diagnosis not present

## 2021-07-31 DIAGNOSIS — Z853 Personal history of malignant neoplasm of breast: Secondary | ICD-10-CM

## 2021-07-31 DIAGNOSIS — Z01818 Encounter for other preprocedural examination: Secondary | ICD-10-CM | POA: Diagnosis not present

## 2021-07-31 DIAGNOSIS — G9389 Other specified disorders of brain: Secondary | ICD-10-CM | POA: Diagnosis not present

## 2021-07-31 DIAGNOSIS — I609 Nontraumatic subarachnoid hemorrhage, unspecified: Secondary | ICD-10-CM | POA: Diagnosis present

## 2021-07-31 DIAGNOSIS — I129 Hypertensive chronic kidney disease with stage 1 through stage 4 chronic kidney disease, or unspecified chronic kidney disease: Secondary | ICD-10-CM | POA: Diagnosis present

## 2021-07-31 DIAGNOSIS — E039 Hypothyroidism, unspecified: Secondary | ICD-10-CM | POA: Diagnosis not present

## 2021-07-31 DIAGNOSIS — E870 Hyperosmolality and hypernatremia: Secondary | ICD-10-CM | POA: Diagnosis not present

## 2021-07-31 DIAGNOSIS — Z0189 Encounter for other specified special examinations: Secondary | ICD-10-CM

## 2021-07-31 DIAGNOSIS — F418 Other specified anxiety disorders: Secondary | ICD-10-CM | POA: Diagnosis not present

## 2021-07-31 DIAGNOSIS — K449 Diaphragmatic hernia without obstruction or gangrene: Secondary | ICD-10-CM | POA: Diagnosis not present

## 2021-07-31 DIAGNOSIS — F339 Major depressive disorder, recurrent, unspecified: Secondary | ICD-10-CM | POA: Diagnosis not present

## 2021-07-31 DIAGNOSIS — R7879 Finding of abnormal level of heavy metals in blood: Secondary | ICD-10-CM | POA: Diagnosis not present

## 2021-07-31 DIAGNOSIS — F0394 Unspecified dementia, unspecified severity, with anxiety: Secondary | ICD-10-CM | POA: Diagnosis not present

## 2021-07-31 DIAGNOSIS — R410 Disorientation, unspecified: Secondary | ICD-10-CM | POA: Diagnosis not present

## 2021-07-31 DIAGNOSIS — Z841 Family history of disorders of kidney and ureter: Secondary | ICD-10-CM

## 2021-07-31 LAB — URINALYSIS, ROUTINE W REFLEX MICROSCOPIC
Glucose, UA: NEGATIVE mg/dL
Hgb urine dipstick: NEGATIVE
Ketones, ur: NEGATIVE mg/dL
Leukocytes,Ua: NEGATIVE
Nitrite: NEGATIVE
Protein, ur: NEGATIVE mg/dL
Specific Gravity, Urine: >1.03 — ABNORMAL HIGH (ref 1.005–1.030)
pH: 5.5 (ref 5.0–8.0)

## 2021-07-31 LAB — COMPREHENSIVE METABOLIC PANEL WITH GFR
ALT: 17 U/L (ref 0–44)
AST: 33 U/L (ref 15–41)
Albumin: 3.6 g/dL (ref 3.5–5.0)
Alkaline Phosphatase: 75 U/L (ref 38–126)
Anion gap: 14 (ref 5–15)
BUN: 41 mg/dL — ABNORMAL HIGH (ref 8–23)
CO2: 25 mmol/L (ref 22–32)
Calcium: 9.5 mg/dL (ref 8.9–10.3)
Chloride: 117 mmol/L — ABNORMAL HIGH (ref 98–111)
Creatinine, Ser: 2 mg/dL — ABNORMAL HIGH (ref 0.44–1.00)
GFR, Estimated: 27 mL/min — ABNORMAL LOW
Glucose, Bld: 214 mg/dL — ABNORMAL HIGH (ref 70–99)
Potassium: 3.9 mmol/L (ref 3.5–5.1)
Sodium: 156 mmol/L — ABNORMAL HIGH (ref 135–145)
Total Bilirubin: 0.6 mg/dL (ref 0.3–1.2)
Total Protein: 7.8 g/dL (ref 6.5–8.1)

## 2021-07-31 LAB — CBC WITH DIFFERENTIAL/PLATELET
Abs Immature Granulocytes: 0.03 K/uL (ref 0.00–0.07)
Basophils Absolute: 0.1 K/uL (ref 0.0–0.1)
Basophils Relative: 1 %
Eosinophils Absolute: 0.2 K/uL (ref 0.0–0.5)
Eosinophils Relative: 2 %
HCT: 51.1 % — ABNORMAL HIGH (ref 36.0–46.0)
Hemoglobin: 15.8 g/dL — ABNORMAL HIGH (ref 12.0–15.0)
Immature Granulocytes: 0 %
Lymphocytes Relative: 10 %
Lymphs Abs: 0.9 K/uL (ref 0.7–4.0)
MCH: 28.8 pg (ref 26.0–34.0)
MCHC: 30.9 g/dL (ref 30.0–36.0)
MCV: 93.2 fL (ref 80.0–100.0)
Monocytes Absolute: 0.7 K/uL (ref 0.1–1.0)
Monocytes Relative: 8 %
Neutro Abs: 7.2 K/uL (ref 1.7–7.7)
Neutrophils Relative %: 79 %
Platelets: 412 K/uL — ABNORMAL HIGH (ref 150–400)
RBC: 5.48 MIL/uL — ABNORMAL HIGH (ref 3.87–5.11)
RDW: 13.6 % (ref 11.5–15.5)
WBC: 9.1 K/uL (ref 4.0–10.5)
nRBC: 0 % (ref 0.0–0.2)

## 2021-07-31 LAB — RAPID URINE DRUG SCREEN, HOSP PERFORMED
Amphetamines: NOT DETECTED
Barbiturates: NOT DETECTED
Benzodiazepines: NOT DETECTED
Cocaine: NOT DETECTED
Opiates: NOT DETECTED
Tetrahydrocannabinol: NOT DETECTED

## 2021-07-31 LAB — RESP PANEL BY RT-PCR (FLU A&B, COVID) ARPGX2
Influenza A by PCR: NEGATIVE
Influenza B by PCR: NEGATIVE
SARS Coronavirus 2 by RT PCR: NEGATIVE

## 2021-07-31 LAB — ETHANOL: Alcohol, Ethyl (B): <10 mg/dL (ref <10)

## 2021-07-31 LAB — GLUCOSE, CAPILLARY: Glucose-Capillary: 140 mg/dL — ABNORMAL HIGH (ref 70–99)

## 2021-07-31 MED ORDER — HALOPERIDOL LACTATE 5 MG/ML IJ SOLN
2.0000 mg | Freq: Once | INTRAMUSCULAR | Status: AC | PRN
  Administered 2021-08-01: 2 mg via INTRAVENOUS
  Filled 2021-07-31: qty 1

## 2021-07-31 MED ORDER — ACETAMINOPHEN 325 MG PO TABS
650.0000 mg | ORAL_TABLET | Freq: Four times a day (QID) | ORAL | Status: DC | PRN
  Administered 2021-08-05 – 2021-08-08 (×2): 650 mg via ORAL
  Filled 2021-07-31 (×2): qty 2

## 2021-07-31 MED ORDER — INSULIN ASPART 100 UNIT/ML IJ SOLN
0.0000 [IU] | INTRAMUSCULAR | Status: DC
  Administered 2021-07-31 – 2021-08-01 (×2): 1 [IU] via SUBCUTANEOUS
  Administered 2021-08-01: 2 [IU] via SUBCUTANEOUS
  Administered 2021-08-02: 1 [IU] via SUBCUTANEOUS
  Administered 2021-08-02: 2 [IU] via SUBCUTANEOUS
  Administered 2021-08-02 – 2021-08-03 (×2): 1 [IU] via SUBCUTANEOUS
  Administered 2021-08-03: 2 [IU] via SUBCUTANEOUS

## 2021-07-31 MED ORDER — HALOPERIDOL LACTATE 5 MG/ML IJ SOLN
5.0000 mg | Freq: Once | INTRAMUSCULAR | Status: AC
Start: 2021-07-31 — End: 2021-07-31
  Administered 2021-07-31: 5 mg via INTRAMUSCULAR
  Filled 2021-07-31: qty 1

## 2021-07-31 MED ORDER — ACETAMINOPHEN 650 MG RE SUPP: 650.0000 mg | Freq: Four times a day (QID) | RECTAL | Status: DC | PRN

## 2021-07-31 MED ORDER — SODIUM CHLORIDE 0.45 % IV SOLN: INTRAVENOUS | Status: DC

## 2021-07-31 MED ORDER — LORAZEPAM 2 MG/ML IJ SOLN
1.0000 mg | Freq: Once | INTRAMUSCULAR | Status: AC
  Administered 2021-07-31: 1 mg via INTRAMUSCULAR
  Filled 2021-07-31: qty 1

## 2021-07-31 NOTE — ED Notes (Signed)
Granddaughter Vernie Shanks [phone] would like an update and explain things about her grandmother

## 2021-07-31 NOTE — Assessment & Plan Note (Addendum)
Dementia with behavioral disturbance Likely due to metabolic derangements including severe dehydration/AKI/hypernatremia.  No fever or leukocytosis.  UA without signs of infection.  UDS negative.  Blood ethanol level undetectable.   Pt likely has baseline dementia.  MRI of the brain concerning for lymphoma .  S/p LP done, bacterial meningitis ruled out.  Meanwhile neurology recommends to continue with anti viral agents.  EEG ordered to evaluate for seizures.  CT chest, abdomen and pelvis ordered for evaluation of malignancy.  Delirium precautions.  - .

## 2021-07-31 NOTE — ED Notes (Signed)
3 copies of IVC paperwork handed to RN, original copy in red folder, original copy in red folder , faxed to [facility]

## 2021-07-31 NOTE — Assessment & Plan Note (Addendum)
Likely prerenal azotemia from severe dehydration. BUN 41, creatinine 2.0 (baseline 1.0-1.3). -much improved with IV fluids.  - continue to monitor.

## 2021-07-31 NOTE — Assessment & Plan Note (Addendum)
Per report from ED staff, patient's primary caregiver is her husband who was hospitalized for the past few days.  Her daughter is also currently hospitalized and admitted to the ICU.  Patient was at home with her grand children who reportedly have substances of abuse related problems.  When granddaughter went to check on the patient today, she was aggravated, combative, eating her own feces and covered in feces, roaches, lice, and bedbugs.  IVC paperwork was done per request from granddaughter. -PT/OT and social work consulted for placement
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
## 2021-07-31 NOTE — ED Triage Notes (Signed)
BIB [facility] after being called to patients home for AMS. Per EMS, pt was eating her feces and was covered in lice, roaches, and bedbugs upon their arrival. Pt combative on arrival. VSS. 110/70 100 16 98%RA

## 2021-07-31 NOTE — ED Notes (Signed)
Pt clothing collected and patient showered and deconned.

## 2021-07-31 NOTE — Assessment & Plan Note (Addendum)
Likely due to severe dehydration.  She has very dry mucous membranes and decreased skin turgor.  Sodium 156.   Resolved with IV fluids.

## 2021-07-31 NOTE — H&P (Signed)
History and Physical    Patient: Marie [name] GNF:[id number] DOB: 1950-08-23 DOA: 07/31/2021 DOS: the patient was seen and examined on 07/31/2021 PCP: [name], MD  Patient coming from: Home  Chief Complaint:  Chief Complaint  Patient presents with   Altered Mental Status    HPI: Marie [name] is a 71 y.o. female with medical history significant of dementia with behavioral disturbance, depression, type II diabetes not on medications, hyperlipidemia, hypertension, hypothyroidism, mild nonobstructive CAD per cath done in May 2021, vitamin D deficiency, aortic atherosclerosis, CKD stage IIIa, history of breast cancer presented to the ED via EMS and law enforcement under IVC.  Patient normally lives with her husband at home who manages her ADLs, medications etc.  Husband was admitted to the hospital for the last 4 days and she was alone at home.  Per [employer], family is unreliable and has mental health issues of their own.  Husband was discharged from the hospital today and granddaughter came home before he arrived to find the patient aggravated, combative, eating her own feces and covered in feces, roaches, lice, and bedbugs.  Granddaughter has taken out IVC papers.  Vital signs stable on arrival to the ED.  Patient was combative and was given Ativan and Haldol.  Labs showing WBC 9.1, hemoglobin 15.8, platelet count 412k.  Sodium 156, potassium 3.9, chloride 117, bicarb 25, BUN 41, creatinine 2.0 (baseline 1.0-1.3), glucose 214.  LFTs normal.  Blood ethanol level undetectable.  UA without signs of infection.  UDS negative.  COVID and influenza PCR pending.  Patient is currently somnolent and not able to give any history.  Review of Systems: As mentioned in the history of present illness. All other systems reviewed and are negative. Past Medical History:  Diagnosis Date   Dementia ([place])    Depression    Diabetes mellitus    Hypertension    Thyroid disease    Type II or unspecified type  diabetes mellitus without mention of complication, not stated as uncontrolled    stopped metformin due to diarrhea, no meds now   Vitamin D deficiency    Past Surgical History:  Procedure Laterality Date   BREAST LUMPECTOMY WITH RADIOACTIVE SEED AND SENTINEL LYMPH NODE BIOPSY Right 03/08/2018   Procedure: BREAST LUMPECTOMY WITH RADIOACTIVE SEED AND SENTINEL LYMPH NODE BIOPSY;  Surgeon: [name], MD;  Location: [facility];  Service: General;  Laterality: Right;   CARDIAC CATHETERIZATION  11/05/2019   FRACTURE SURGERY  2009   right arm; rod   LEFT HEART CATH AND CORONARY ANGIOGRAPHY N/A 11/05/2019   Procedure: LEFT HEART CATH AND CORONARY ANGIOGRAPHY;  Surgeon: [name], MD;  Location: [facility];  Service: Cardiovascular;  Laterality: N/A;   TOTAL KNEE ARTHROPLASTY  june 2013   left   Social History:  reports that she has never smoked. She has never used smokeless tobacco. She reports that she does not drink alcohol and does not use drugs.  Allergies  Allergen Reactions   Penicillins Rash    Swelling in face - last 10 years    Family History  Problem Relation Age of Onset   Emphysema Mother    Breast cancer Mother    Dementia Father    Kidney failure Father    Colon cancer Neg Hx    Stomach cancer Neg Hx    Ovarian cancer Neg Hx    Prostate cancer Neg Hx     Prior to Admission medications  Medication Sig Start Date End Date Taking? Authorizing Provider  Blood Glucose Monitoring Suppl (ONETOUCH VERIO) w/Device KIT 1 kit by Does not apply route as needed. Use to check glucose QD DX-E11.22 Patient not taking: Reported on 07/31/2021 05/25/17   [name], MD  donepezil (ARICEPT) 23 MG TABS tablet Take 1 tablet daily at bedtime for Memory Patient not taking: Reported on 07/31/2021 12/02/20   Liane Comber, NP  escitalopram (LEXAPRO) 10 MG tablet Take 1 tablet (10 mg total) by mouth daily. Patient not taking: Reported on 07/31/2021 12/02/20  12/02/21  Liane Comber, NP  glucose blood test strip CHECK BLOOD SUGAR 1 TIME DAILY-DX e11.22 Patient not taking: Reported on 07/31/2021 06/13/17   Liane Comber, NP  ONE [facility] LANCETS MISC Use as directed to check glucose daily. NO-M76.72 Patient not taking: Reported on 07/31/2021 06/13/17   Liane Comber, NP  QUEtiapine (SEROQUEL) 25 MG tablet Take  1 tablet  at Suppertime  and may repeat in 2-4 hours  up to 4 tabs /24 hours if needed for Agitation Patient not taking: Reported on 07/31/2021 10/13/20   [name], MD  rosuvastatin (CRESTOR) 40 MG tablet Take 1 tablet Daily for Cholesterol Patient not taking: Reported on 07/31/2021 02/25/21   Magda Bernheim, NP    Physical Exam: Vitals:   07/31/21 1949 07/31/21 2115 07/31/21 2300 07/31/21 2303  BP: 107/90 116/66  127/90  Pulse: (!) 105 (!) 103  (!) 109  Resp: 17 18  18   Temp: 98 F (36.7 C)   97.8 F (36.6 C)  TempSrc: Oral   Oral  SpO2: 98% 100%  100%  Weight:   50.1 kg   Height:       Physical Exam Constitutional:      General: She is not in acute distress. HENT:     Head: Normocephalic and atraumatic.     Mouth/Throat:     Comments: Very dry mucous membranes Cardiovascular:     Rate and Rhythm: Normal rate and regular rhythm.     Pulses: Normal pulses.  Pulmonary:     Effort: Pulmonary effort is normal. No respiratory distress.     Breath sounds: No wheezing or rales.  Abdominal:     General: Bowel sounds are normal. There is no distension.     Palpations: Abdomen is soft.     Tenderness: There is no abdominal tenderness.  Musculoskeletal:        General: No swelling or tenderness.     Cervical back: Normal range of motion and neck supple.  Skin:    General: Skin is warm and dry.     Comments: Decreased skin turgor  Neurological:     Mental Status: She is alert.     Comments: Somnolent but arousable Moving all extremities spontaneously, no focal weakness     Data Reviewed:  Labs reviewed (see HPI).  EKG:  Independently reviewed.  Sinus tachycardia.  Assessment and Plan: * Acute metabolic encephalopathy- (present on admission) Dementia with behavioral disturbance Likely due to metabolic derangements including severe dehydration/AKI/hypernatremia.  No fever or leukocytosis.  UA without signs of infection.  UDS negative.  Blood ethanol level undetectable.  She was combative in the ED and was given Ativan and Haldol.  Currently somnolent but arousable, moving all extremities spontaneously. -Stat head CT.  Check TSH, B12, and ammonia levels.  Chest x-ray ordered for infectious work-up.  Continue Haldol as needed for agitation.  Follow delirium precautions.  Hypernatremia- (present on admission) Likely due to severe  dehydration.  She has very dry mucous membranes and decreased skin turgor.  Sodium 156.   -0.45% saline at 125 cc/h.  Monitor sodium level every 4 hours and adjust rate of fluids accordingly.  Switch to D5W if hypernatremia worsens or does not correct.  Goal rate of correction no more than 10 mEq in 24 hours.  Check urine osmolarity.  Acute renal failure superimposed on stage 3a chronic kidney disease (HCC) Likely prerenal azotemia from severe dehydration. BUN 41, creatinine 2.0 (baseline 1.0-1.3). -IV fluid hydration.  Monitor renal function and urine output.  Avoid nephrotoxic agents/contrast.  Social problem Per report from ED staff, patient's primary caregiver is her husband who was hospitalized for the past few days.  Her daughter is also currently hospitalized and admitted to the ICU.  Patient was at home with her grand children who reportedly have substances of abuse related problems.  When granddaughter went to check on the patient today, she was aggravated, combative, eating her own feces and covered in feces, roaches, lice, and bedbugs.  IVC paperwork was done per request from granddaughter. -PT/OT and social work consulted for placement  Type 2 diabetes mellitus with stage 3a  chronic kidney disease, without long-term current use of insulin ([place])- (present on admission) A1c 6.2 in July 2021.  Not on any medications.  CBG currently 140. -Repeat A1c.  Order sliding scale insulin sensitive every 4 hours.   Advance Care Planning:   Code Status: Full Code   Family Communication: No family available at this time.  Severity of Illness: The appropriate patient status for this patient is INPATIENT. Inpatient status is judged to be reasonable and necessary in order to provide the required intensity of service to ensure the patient's safety. The patient's presenting symptoms, physical exam findings, and initial radiographic and laboratory data in the context of their chronic comorbidities is felt to place them at high risk for further clinical deterioration. Furthermore, it is not anticipated that the patient will be medically stable for discharge from the hospital within 2 midnights of admission.   * I certify that at the point of admission it is my clinical judgment that the patient will require inpatient hospital care spanning beyond 2 midnights from the point of admission due to high intensity of service, high risk for further deterioration and high frequency of surveillance required.*  Author: Shela Leff, MD 07/31/2021 11:20 PM  For on call review [email or web address].

## 2021-07-31 NOTE — Assessment & Plan Note (Addendum)
A1c is 6.3%. Continue with SSI.  CBG (last 3)  Recent Labs    08/03/21 2355 08/04/21 0407 08/04/21 1219  GLUCAP 110* 110* 133*

## 2021-08-01 ENCOUNTER — Inpatient Hospital Stay (HOSPITAL_COMMUNITY): Payer: Medicare PPO

## 2021-08-01 DIAGNOSIS — N179 Acute kidney failure, unspecified: Principal | ICD-10-CM

## 2021-08-01 DIAGNOSIS — N1831 Chronic kidney disease, stage 3a: Secondary | ICD-10-CM

## 2021-08-01 DIAGNOSIS — E870 Hyperosmolality and hypernatremia: Secondary | ICD-10-CM

## 2021-08-01 DIAGNOSIS — E1122 Type 2 diabetes mellitus with diabetic chronic kidney disease: Secondary | ICD-10-CM

## 2021-08-01 DIAGNOSIS — Z659 Problem related to unspecified psychosocial circumstances: Secondary | ICD-10-CM

## 2021-08-01 LAB — BASIC METABOLIC PANEL
Anion gap: 10 (ref 5–15)
Anion gap: 12 (ref 5–15)
Anion gap: 12 (ref 5–15)
Anion gap: 13 (ref 5–15)
Anion gap: 7 (ref 5–15)
BUN: 32 mg/dL — ABNORMAL HIGH (ref 8–23)
BUN: 34 mg/dL — ABNORMAL HIGH (ref 8–23)
BUN: 39 mg/dL — ABNORMAL HIGH (ref 8–23)
BUN: 39 mg/dL — ABNORMAL HIGH (ref 8–23)
BUN: 42 mg/dL — ABNORMAL HIGH (ref 8–23)
CO2: 23 mmol/L (ref 22–32)
CO2: 24 mmol/L (ref 22–32)
CO2: 26 mmol/L (ref 22–32)
CO2: 26 mmol/L (ref 22–32)
CO2: 26 mmol/L (ref 22–32)
Calcium: 8.9 mg/dL (ref 8.9–10.3)
Calcium: 9 mg/dL (ref 8.9–10.3)
Calcium: 9.1 mg/dL (ref 8.9–10.3)
Calcium: 9.3 mg/dL (ref 8.9–10.3)
Calcium: 9.3 mg/dL (ref 8.9–10.3)
Chloride: 116 mmol/L — ABNORMAL HIGH (ref 98–111)
Chloride: 117 mmol/L — ABNORMAL HIGH (ref 98–111)
Chloride: 117 mmol/L — ABNORMAL HIGH (ref 98–111)
Chloride: 119 mmol/L — ABNORMAL HIGH (ref 98–111)
Chloride: 119 mmol/L — ABNORMAL HIGH (ref 98–111)
Creatinine, Ser: 1.41 mg/dL — ABNORMAL HIGH (ref 0.44–1.00)
Creatinine, Ser: 1.54 mg/dL — ABNORMAL HIGH (ref 0.44–1.00)
Creatinine, Ser: 1.73 mg/dL — ABNORMAL HIGH (ref 0.44–1.00)
Creatinine, Ser: 1.85 mg/dL — ABNORMAL HIGH (ref 0.44–1.00)
Creatinine, Ser: 1.86 mg/dL — ABNORMAL HIGH (ref 0.44–1.00)
GFR, Estimated: 29 mL/min — ABNORMAL LOW (ref >60)
GFR, Estimated: 29 mL/min — ABNORMAL LOW (ref >60)
GFR, Estimated: 32 mL/min — ABNORMAL LOW (ref >60)
GFR, Estimated: 36 mL/min — ABNORMAL LOW (ref >60)
GFR, Estimated: 40 mL/min — ABNORMAL LOW (ref >60)
Glucose, Bld: 114 mg/dL — ABNORMAL HIGH (ref 70–99)
Glucose, Bld: 133 mg/dL — ABNORMAL HIGH (ref 70–99)
Glucose, Bld: 136 mg/dL — ABNORMAL HIGH (ref 70–99)
Glucose, Bld: 148 mg/dL — ABNORMAL HIGH (ref 70–99)
Glucose, Bld: 187 mg/dL — ABNORMAL HIGH (ref 70–99)
Potassium: 3.2 mmol/L — ABNORMAL LOW (ref 3.5–5.1)
Potassium: 3.5 mmol/L (ref 3.5–5.1)
Potassium: 3.6 mmol/L (ref 3.5–5.1)
Potassium: 3.8 mmol/L (ref 3.5–5.1)
Potassium: 4.3 mmol/L (ref 3.5–5.1)
Sodium: 149 mmol/L — ABNORMAL HIGH (ref 135–145)
Sodium: 150 mmol/L — ABNORMAL HIGH (ref 135–145)
Sodium: 155 mmol/L — ABNORMAL HIGH (ref 135–145)
Sodium: 156 mmol/L — ABNORMAL HIGH (ref 135–145)
Sodium: 157 mmol/L — ABNORMAL HIGH (ref 135–145)

## 2021-08-01 LAB — GLUCOSE, CAPILLARY
Glucose-Capillary: 107 mg/dL — ABNORMAL HIGH (ref 70–99)
Glucose-Capillary: 137 mg/dL — ABNORMAL HIGH (ref 70–99)
Glucose-Capillary: 168 mg/dL — ABNORMAL HIGH (ref 70–99)
Glucose-Capillary: 96 mg/dL (ref 70–99)
Glucose-Capillary: 115 mg/dL — ABNORMAL HIGH (ref 70–99)

## 2021-08-01 LAB — HEMOGLOBIN A1C
Hgb A1c MFr Bld: 6.3 % — ABNORMAL HIGH (ref 4.8–5.6)
Mean Plasma Glucose: 134.11 mg/dL

## 2021-08-01 LAB — CBC
HCT: 45.5 % (ref 36.0–46.0)
Hemoglobin: 13.9 g/dL (ref 12.0–15.0)
MCH: 28.3 pg (ref 26.0–34.0)
MCHC: 30.5 g/dL (ref 30.0–36.0)
MCV: 92.5 fL (ref 80.0–100.0)
Platelets: 376 10*3/uL (ref 150–400)
RBC: 4.92 MIL/uL (ref 3.87–5.11)
RDW: 13.6 % (ref 11.5–15.5)
WBC: 9.8 10*3/uL (ref 4.0–10.5)
nRBC: 0 % (ref 0.0–0.2)

## 2021-08-01 LAB — AMMONIA: Ammonia: 32 umol/L (ref 9–35)

## 2021-08-01 LAB — MAGNESIUM: Magnesium: 2.2 mg/dL (ref 1.7–2.4)

## 2021-08-01 LAB — TSH: TSH: 3.703 u[IU]/mL (ref 0.350–4.500)

## 2021-08-01 LAB — HIV ANTIBODY (ROUTINE TESTING W REFLEX): HIV Screen 4th Generation wRfx: NONREACTIVE

## 2021-08-01 LAB — VITAMIN B12: Vitamin B-12: 527 pg/mL (ref 180–914)

## 2021-08-01 MED ORDER — HALOPERIDOL LACTATE 5 MG/ML IJ SOLN
5.0000 mg | Freq: Four times a day (QID) | INTRAMUSCULAR | Status: AC | PRN
  Administered 2021-08-01: 5 mg via INTRAVENOUS
  Filled 2021-08-01: qty 1

## 2021-08-01 MED ORDER — HALOPERIDOL LACTATE 5 MG/ML IJ SOLN
2.0000 mg | Freq: Once | INTRAMUSCULAR | Status: AC | PRN
  Administered 2021-08-01: 2 mg via INTRAVENOUS
  Filled 2021-08-01: qty 1

## 2021-08-01 MED ORDER — ORAL CARE MOUTH RINSE
15.0000 mL | Freq: Two times a day (BID) | OROMUCOSAL | Status: DC
  Administered 2021-08-01 – 2021-08-13 (×14): 15 mL via OROMUCOSAL

## 2021-08-01 MED ORDER — PERMETHRIN 1 % EX LOTN
TOPICAL_LOTION | Freq: Once | CUTANEOUS | Status: AC
  Filled 2021-08-01: qty 59

## 2021-08-01 MED ORDER — DEXTROSE 5 % IV SOLN: INTRAVENOUS | Status: DC

## 2021-08-01 MED ORDER — POTASSIUM CHLORIDE 10 MEQ/100ML IV SOLN
10.0000 meq | INTRAVENOUS | Status: AC
  Administered 2021-08-01 (×4): 10 meq via INTRAVENOUS
  Filled 2021-08-01 (×4): qty 100

## 2021-08-01 NOTE — Progress Notes (Signed)
Brief Neuro Update:  Attempted twice but unable to get in touch with husband or grand daughter over phone to obtain consent for LP. I do no think this is an emergency. We can get MRI and then try to contact family again.  [place] Pager Number [phone]

## 2021-08-01 NOTE — Progress Notes (Signed)
Radiology paged regarding MD's STAT orders for Chest and KUB Xrays. Tech endorsed that they would be up when able.

## 2021-08-01 NOTE — Progress Notes (Signed)
PROGRESS NOTE  Marie [name] LGX:[id number] DOB: 03/16/1951 DOA: 07/31/2021 PCP: [name], MD  HPI/Recap of past 24 hours: Marie [name] is a 71 y.o. female with medical history significant of dementia with behavioral disturbance, depression, DM type 2 not on medications, HLD, HTN, hypothyroidism, mild nonobstructive CAD per cath done in May 2021, CKD stage IIIa, history of breast cancer presented to the ED via EMS and law enforcement under IVC. Patient normally lives with her husband at home who manages her ADLs. Husband was admitted to the hospital for the last 4 days and she was alone at home. Per [employer], family is unreliable and has mental health issues of their own. Husband was discharged from the hospital today and granddaughter came home before he arrived to find the patient aggravated, combative, eating her own feces and covered in feces, roaches, lice, and bedbugs.  Granddaughter has taken out IVC papers.  Vital signs stable on arrival to the ED.  Patient was combative and was given Ativan and Haldol.  Labs fairly stable except for sodium 156, BUN 41, creatinine 2.0 (baseline 1.0-1.3), UA without signs of infection. Ethanol and UDS negative. CT head concerning for subarachnoid hemorrhage. Neurosurgery consulted and requested MRI and LP. MRI attempted after patient received Haldol, but patient still combative and noted motion degraded, recommending repeat MRI with sedation for further evaluation. Neurology attempted to contact patient for consent for LP, with no luck. Patient admitted for further management.     Patient seen and examined at bedside.  Unable to appropriately respond to questions asked.  Noted to be talking to herself.  Refusing to follow commands.  Patient was awake, alert.   Assessment/Plan: Principal Problem:   Acute metabolic encephalopathy Active Problems:   Type 2 diabetes mellitus with stage 3a chronic kidney disease, without long-term current use of insulin  (HCC)   Acute renal failure superimposed on stage 3a chronic kidney disease (HCC)   Hypernatremia   Social problem   Acute metabolic encephalopathy- (present on admission) Dementia with behavioral disturbance Likely due to metabolic derangements (dehydration/hypernatremia/AKI) with underlying dementia  Currently afebrile, with no leukocytosis UA without signs of infection TSH, Vit B12, ammonia all WNL Ethanol and UDS negative CXR unremarkable Monitor closely, delirium precautions   Hypernatremia- (present on admission) Likely due to severe dehydration, sodium 156 on presentation Continue IVF Frequent BMP, to avoid over correction  Possible subarachnoid hemorrhage seen on CT CT head showed above Neurosurgery consulted, recommend MRI and LP Initial MRI inconclusive due to motion, patient will require sedation for repeat MRI (unable to reach any family member for consent) Repeat MRI pending LP pending (unable to reach any family member for consent)   AKI on stage 3a chronic kidney disease On admission, BUN 41, creatinine 2.0 (baseline 1.0-1.3). Continue IVF Daily BMP  Hypokalemia Replace as needed Daily BMP  Type 2 DM, without long-term current use of insulin ([facility])- (present on admission) A1c 6.3 Not on any medications at home SSI every 4 hours, Accu-Cheks, hypoglycemic protocol   Social problem Per report from ED staff, patient's primary caregiver is her husband who was hospitalized for the past few days.  Her daughter is also currently hospitalized and admitted to the ICU. Patient was at home with her grand children who reportedly have substance abuse issues. Another granddaughter, checked on patient, noted to be aggravated, combative, eating her own feces and covered in feces, roaches, lice, and bedbugs.  IVC paperwork was done per request from granddaughter PT/OT and social  work consulted for placement       Estimated body mass index is 18.67 kg/m as calculated from  the following:   Height as of this encounter: 5' 4.5" (1.638 m).   Weight as of this encounter: 50.1 kg.     Code Status: Full  Family Communication: None at bedside, unable to reach anyone over the phone  Disposition Plan: Status is: Inpatient Remains inpatient appropriate because: Level of care, will need placement    Consultants: Neurosurgery Neurology  Procedures: None  Antimicrobials: None  DVT prophylaxis:  SCDs   Objective: Vitals:   07/31/21 2300 07/31/21 2303 08/01/21 0518 08/01/21 0815  BP:  127/90 (!) 109/95 (!) 152/101  Pulse:  (!) 109 96 100  Resp:  18 19 19   Temp:  97.8 F (36.6 C) 98.2 F (36.8 C) 97.9 F (36.6 C)  TempSrc:  Oral Axillary Axillary  SpO2:  100% 93% 92%  Weight: 50.1 kg     Height:        Intake/Output Summary (Last 24 hours) at 08/01/2021 1202 Last data filed at 08/01/2021 0500 Gross per 24 hour  Intake 499.11 ml  Output --  Net 499.11 ml   Filed Weights   07/31/21 1734 07/31/21 2300  Weight: 59 kg 50.1 kg    Exam: General: NAD, intermittently restless, awake, alert, not responding to questions asked  Cardiovascular: S1, S2 present Respiratory: CTAB Abdomen: Soft, nontender, nondistended, bowel sounds present Musculoskeletal: No bilateral pedal edema noted Skin: Normal Psychiatry: Unable to assess Neurology: Unable to perform as not following commands     Data Reviewed: CBC: Recent Labs  Lab 07/31/21 1941 08/01/21 0304  WBC 9.1 9.8  NEUTROABS 7.2  --   HGB 15.8* 13.9  HCT 51.1* 45.5  MCV 93.2 92.5  PLT 412* 353   Basic Metabolic Panel: Recent Labs  Lab 07/31/21 1941 07/31/21 2356 08/01/21 0304 08/01/21 0654  NA 156* 155* 157* 156*  K 3.9 3.6 3.5 3.2*  CL 117* 117* 119* 119*  CO2 25 26 26 24   GLUCOSE 214* 148* 114* 136*  BUN 41* 42* 39* 39*  CREATININE 2.00* 1.85* 1.86* 1.73*  CALCIUM 9.5 9.3 9.3 9.1   GFR: Estimated Creatinine Clearance: 24.3 mL/min (A) (by C-G formula based on SCr of 1.73  mg/dL (H)). Liver Function Tests: Recent Labs  Lab 07/31/21 1941  AST 33  ALT 17  ALKPHOS 75  BILITOT 0.6  PROT 7.8  ALBUMIN 3.6   No results for input(s): LIPASE, AMYLASE in the last 168 hours. Recent Labs  Lab 07/31/21 2356  AMMONIA 32   Coagulation Profile: No results for input(s): INR, PROTIME in the last 168 hours. Cardiac Enzymes: No results for input(s): CKTOTAL, CKMB, CKMBINDEX, TROPONINI in the last 168 hours. BNP (last 3 results) No results for input(s): PROBNP in the last 8760 hours. HbA1C: Recent Labs    07/31/21 2356  HGBA1C 6.3*   CBG: Recent Labs  Lab 07/31/21 2250 08/01/21 0340 08/01/21 0809  GLUCAP 140* 107* 137*   Lipid Profile: No results for input(s): CHOL, HDL, LDLCALC, TRIG, CHOLHDL, LDLDIRECT in the last 72 hours. Thyroid Function Tests: Recent Labs    07/31/21 2356  TSH 3.703   Anemia Panel: Recent Labs    08/01/21 0304  VITAMINB12 527   Urine analysis:    Component Value Date/Time   COLORURINE YELLOW 07/31/2021 1941   APPEARANCEUR CLEAR 07/31/2021 1941   LABSPEC >1.030 (H) 07/31/2021 [place] 5.5 07/31/2021 1941   GLUCOSEU  NEGATIVE 07/31/2021 1941   HGBUR NEGATIVE 07/31/2021 1941   BILIRUBINUR MODERATE (A) 07/31/2021 1941   KETONESUR NEGATIVE 07/31/2021 1941   PROTEINUR NEGATIVE 07/31/2021 1941   UROBILINOGEN 0.2 07/16/2014 1212   NITRITE NEGATIVE 07/31/2021 1941   LEUKOCYTESUR NEGATIVE 07/31/2021 1941   Sepsis Labs: @LABRCNTIP (procalcitonin:4,lacticidven:4)  ) Recent Results (from the past 240 hour(s))  Resp Panel by RT-PCR (Flu A&B, Covid) Nasopharyngeal Swab     Status: None   Collection Time: 07/31/21  9:13 PM   Specimen: Nasopharyngeal Swab; Nasopharyngeal(NP) swabs in vial transport medium  Result Value Ref Range Status   SARS Coronavirus 2 by RT PCR NEGATIVE NEGATIVE Final    Comment: (NOTE) SARS-CoV-2 target nucleic acids are NOT DETECTED.  The SARS-CoV-2 RNA is generally detectable in upper  respiratory specimens during the acute phase of infection. The lowest concentration of SARS-CoV-2 viral copies this assay can detect is 138 copies/mL. A negative result does not preclude SARS-Cov-2 infection and should not be used as the sole basis for treatment or other patient management decisions. A negative result may occur with  improper specimen collection/handling, submission of specimen other than nasopharyngeal swab, presence of viral mutation(s) within the areas targeted by this assay, and inadequate number of viral copies(<138 copies/mL). A negative result must be combined with clinical observations, patient history, and epidemiological information. The expected result is Negative.  Fact Sheet for Patients:  EntrepreneurPulse.com.au  Fact Sheet for Healthcare Providers:  IncredibleEmployment.be  This test is no t yet approved or cleared by the [place] FDA and  has been authorized for detection and/or diagnosis of SARS-CoV-2 by FDA under an Emergency Use Authorization (EUA). This EUA will remain  in effect (meaning this test can be used) for the duration of the COVID-19 declaration under Section 564(b)(1) of the Act, 21 U.S.C.section 360bbb-3(b)(1), unless the authorization is terminated  or revoked sooner.       Influenza A by PCR NEGATIVE NEGATIVE Final   Influenza B by PCR NEGATIVE NEGATIVE Final    Comment: (NOTE) The Xpert Xpress SARS-CoV-2/FLU/RSV plus assay is intended as an aid in the diagnosis of influenza from Nasopharyngeal swab specimens and should not be used as a sole basis for treatment. Nasal washings and aspirates are unacceptable for Xpert Xpress SARS-CoV-2/FLU/RSV testing.  Fact Sheet for Patients: EntrepreneurPulse.com.au  Fact Sheet for Healthcare Providers: IncredibleEmployment.be  This test is not yet approved or cleared by the [place] FDA and has been  authorized for detection and/or diagnosis of SARS-CoV-2 by FDA under an Emergency Use Authorization (EUA). This EUA will remain in effect (meaning this test can be used) for the duration of the COVID-19 declaration under Section 564(b)(1) of the Act, 21 U.S.C. section 360bbb-3(b)(1), unless the authorization is terminated or revoked.  Performed at [facility], [place] [address] [place], [place] [address]       Studies: CT HEAD WO CONTRAST (5MM)  Result Date: 08/01/2021 CLINICAL DATA:  Delirium, altered mental status EXAM: CT HEAD WITHOUT CONTRAST TECHNIQUE: Contiguous axial images were obtained from the base of the skull through the vertex without intravenous contrast. RADIATION DOSE REDUCTION: This exam was performed according to the departmental dose-optimization program which includes automated exposure control, adjustment of the mA and/or kV according to patient size and/or use of iterative reconstruction technique. COMPARISON:  09/09/2020 FINDINGS: Brain: Ill-defined hyperdensity in the left temporal lobe (series 2, image 11 and series 5, image 44), concerning for subarachnoid hemorrhage. No evidence of acute infarction, hemorrhage, mass, mass effect,  or midline shift. No hydrocephalus. Periventricular white matter changes, likely the sequela of chronic small vessel ischemic disease. Vascular: No hyperdense vessel. Skull: Normal. Negative for fracture or focal lesion. Sinuses/Orbits: No acute finding. Other: The mastoid air cells are well aerated. IMPRESSION: IMPRESSION Hyperdensity overlying the posterior left temporal lobe, which is concerning for subarachnoid hemorrhage. An MRI is recommended for further evaluation. These results were called by telephone at the time of interpretation on 08/01/2021 at 1:53 am to provider [facility] , who verbally acknowledged these results. Electronically Signed   By: Merilyn Baba M.D.   On: 08/01/2021 01:53   MR BRAIN WO CONTRAST  Result Date:  08/01/2021 CLINICAL DATA:  71 year old female with altered mental status. Vague hyperdensity in the posterior left temporal lobe on head CT 0133 hours today possibly subarachnoid hemorrhage. EXAM: MRI HEAD WITHOUT CONTRAST TECHNIQUE: Multiplanar, multiecho pulse sequences of the brain and surrounding structures were obtained without intravenous contrast. COMPARISON:  Head CT 0133 hours. Brain MRI 05/01/2013. FINDINGS: The examination had to be discontinued prior to completion due to combative patient. Axial and coronal DWI, axial T2 and FLAIR sequences only were obtained, and are intermittently motion degraded. Brain: Generalized cerebral volume loss since 2014. No midline shift or ventriculomegaly. Normal basilar cisterns. Up to 3 cm area of heterogeneously increased T2 and FLAIR hyperintensity corresponding to the vague hyperdensity at the posterior left temporal lobe on CT this morning. And on DWI there is conspicuous increased signal hyperintensity, although seems to have both restricted and facilitated diffusion on ADC. See series 3, image 26, series 300, image 26, series 5, image 14. Cortical and subcortical white matter involvement, some of the DWI signal abnormality is gyriform. Mild if any regional mass effect (series 8, image 15). No other convincing diffusion abnormality, but there is evidence of similar abnormal cortical T2/FLAIR hyperintensity in the contralateral posterior right temporal lobe (series 7 image 11 and series 8, image 11). But no definite additional posterior circulation cortical involvement. Nonspecific periatrial white matter hyperintensity in both hemispheres. Deep gray matter nuclei, brainstem and cerebellum grossly negative. Vascular: Major intracranial vascular flow voids are stable since 2014. Skull and upper cervical spine: Not evaluated. Sinuses/Orbits: Paranasal sinuses and mastoids are clear. Negative orbits. Other: Visible internal auditory structures appear normal. Negative  visible scalp. IMPRESSION: 1. Combative patient, truncated exam. DWI, axial T2 and FLAIR only obtained and somewhat motion degraded. 2. Abnormality in the posterior left temporal lobe appears to be parenchymal and at least partially restricted on diffusion, although etiology uncertain on this limited exam. No strong evidence of hemorrhage, and at this time top differential considerations for this area include Hypercellular Tumor and Infarct. No significant mass effect. However, there is also evidence of similar contralateral posterior right temporal lobe cortical and subcortical T2 and FLAIR signal abnormality, although without corresponding diffusion change. PRES was also considered, but unlikely due to apparent sparing of other areas. Overall recommend repeat MRI Head Without And With Contrast, with sedation if necessary to control for motion. 3. Evidence of preserved flow in the intracranial large vessels. Generalized cerebral volume loss since 2014. Electronically Signed   By: Genevie Ann M.D.   On: 08/01/2021 05:32   DG CHEST PORT 1 VIEW  Result Date: 08/01/2021 CLINICAL DATA:  Metal screening EXAM: PORTABLE CHEST 1 VIEW COMPARISON:  09/10/2020 FINDINGS: Lungs are clear. No pneumothorax or pleural effusion. Cardiac size within normal limits. Pulmonary vascularity is normal. Surgical clips are seen within the right axilla. Right humeral ORIF hardware  partially visualized. No unexpected metallic foreign body noted within the visualized thorax. IMPRESSION: No unexpected metallic foreign body identified within the visualized thorax. Electronically Signed   By: Fidela Salisbury M.D.   On: 08/01/2021 03:40   DG Abd Portable 1V  Result Date: 08/01/2021 CLINICAL DATA:  Metal screening, pre MRI EXAM: PORTABLE ABDOMEN - 1 VIEW COMPARISON:  None. FINDINGS: Normal abdominal gas pattern. No gross free intraperitoneal gas. Visualized lung bases are clear. No organomegaly. Multiple rounded calcific densities seen within the  right paraspinal region overlying the right fourth transverse process may represent calculi within the expected ureteropelvic junction. No unexpected metallic foreign body within the abdomen. IMPRESSION: No unexpected metallic foreign body within the visualized abdomen. Possible right urolithiasis. Electronically Signed   By: Fidela Salisbury M.D.   On: 08/01/2021 03:43    Scheduled Meds:  insulin aspart  0-9 Units Subcutaneous Q4H   mouth rinse  15 mL Mouth Rinse BID    Continuous Infusions:  dextrose 100 mL/hr at 08/01/21 0830     LOS: 1 day     Alma Friendly, MD [facility]  If 7PM-7AM, please contact night-coverage [email or web address] 08/01/2021, 12:02 PM

## 2021-08-01 NOTE — Consult Note (Signed)
Reason for Consult: Encephalopathy Referring Physician: Emergency department  Marie [name] is an 71 y.o. female.  HPI: 71 year old female with history of dementia found to be in distress at home.  Patient quite confused and exhibiting bizarre behavior.  Not performing self-care.  No history of trauma.  No history of malignancy.  Past Medical History:  Diagnosis Date   Dementia ([place])    Depression    Diabetes mellitus    Hypertension    Thyroid disease    Type II or unspecified type diabetes mellitus without mention of complication, not stated as uncontrolled    stopped metformin due to diarrhea, no meds now   Vitamin D deficiency     Past Surgical History:  Procedure Laterality Date   BREAST LUMPECTOMY WITH RADIOACTIVE SEED AND SENTINEL LYMPH NODE BIOPSY Right 03/08/2018   Procedure: BREAST LUMPECTOMY WITH RADIOACTIVE SEED AND SENTINEL LYMPH NODE BIOPSY;  Surgeon: [name], MD;  Location: [facility];  Service: General;  Laterality: Right;   CARDIAC CATHETERIZATION  11/05/2019   FRACTURE SURGERY  2009   right arm; rod   LEFT HEART CATH AND CORONARY ANGIOGRAPHY N/A 11/05/2019   Procedure: LEFT HEART CATH AND CORONARY ANGIOGRAPHY;  Surgeon: [name], MD;  Location: [facility];  Service: Cardiovascular;  Laterality: N/A;   TOTAL KNEE ARTHROPLASTY  june 2013   left    Family History  Problem Relation Age of Onset   Emphysema Mother    Breast cancer Mother    Dementia Father    Kidney failure Father    Colon cancer Neg Hx    Stomach cancer Neg Hx    Ovarian cancer Neg Hx    Prostate cancer Neg Hx     Social History:  reports that she has never smoked. She has never used smokeless tobacco. She reports that she does not drink alcohol and does not use drugs.  Allergies:  Allergies  Allergen Reactions   Penicillins Rash    Swelling in face - last 10 years    Medications: I have reviewed the patient's current  medications.  Results for orders placed or performed during the hospital encounter of 07/31/21 (from the past 48 hour(s))  Comprehensive metabolic panel     Status: Abnormal   Collection Time: 07/31/21  7:41 PM  Result Value Ref Range   Sodium 156 (H) 135 - 145 mmol/L   Potassium 3.9 3.5 - 5.1 mmol/L   Chloride 117 (H) 98 - 111 mmol/L   CO2 25 22 - 32 mmol/L   Glucose, Bld 214 (H) 70 - 99 mg/dL    Comment: Glucose reference range applies only to samples taken after fasting for at least 8 hours.   BUN 41 (H) 8 - 23 mg/dL   Creatinine, Ser 2.00 (H) 0.44 - 1.00 mg/dL   Calcium 9.5 8.9 - 10.3 mg/dL   Total Protein 7.8 6.5 - 8.1 g/dL   Albumin 3.6 3.5 - 5.0 g/dL   AST 33 15 - 41 U/L   ALT 17 0 - 44 U/L   Alkaline Phosphatase 75 38 - 126 U/L   Total Bilirubin 0.6 0.3 - 1.2 mg/dL   GFR, Estimated 27 (L) >60 mL/min    Comment: (NOTE) Calculated using the CKD-EPI Creatinine Equation (2021)    Anion gap 14 5 - 15    Comment: Performed at [facility] [address], [place], [place] [address]  Ethanol     Status: None  Collection Time: 07/31/21  7:41 PM  Result Value Ref Range   Alcohol, Ethyl (B) <10 <10 mg/dL    Comment: (NOTE) Lowest detectable limit for serum alcohol is 10 mg/dL.  For medical purposes only. Performed at [facility], [place] [address] [place], [place] [address]   CBC with Differential     Status: Abnormal   Collection Time: 07/31/21  7:41 PM  Result Value Ref Range   WBC 9.1 4.0 - 10.5 K/uL   RBC 5.48 (H) 3.87 - 5.11 MIL/uL   Hemoglobin 15.8 (H) 12.0 - 15.0 g/dL   HCT 51.1 (H) 36.0 - 46.0 %   MCV 93.2 80.0 - 100.0 fL   MCH 28.8 26.0 - 34.0 pg   MCHC 30.9 30.0 - 36.0 g/dL   RDW 13.6 11.5 - 15.5 %   Platelets 412 (H) 150 - 400 K/uL   nRBC 0.0 0.0 - 0.2 %   Neutrophils Relative % 79 %   Neutro Abs 7.2 1.7 - 7.7 K/uL   Lymphocytes Relative 10 %   Lymphs Abs 0.9 0.7 - 4.0 K/uL   Monocytes Relative 8 %   Monocytes Absolute 0.7 0.1 - 1.0 K/uL    Eosinophils Relative 2 %   Eosinophils Absolute 0.2 0.0 - 0.5 K/uL   Basophils Relative 1 %   Basophils Absolute 0.1 0.0 - 0.1 K/uL   Immature Granulocytes 0 %   Abs Immature Granulocytes 0.03 0.00 - 0.07 K/uL    Comment: Performed at [facility] [address], [place], [place] [address]  Urinalysis, Routine w reflex microscopic Urine, In & Out Cath     Status: Abnormal   Collection Time: 07/31/21  7:41 PM  Result Value Ref Range   Color, Urine YELLOW YELLOW   APPearance CLEAR CLEAR   Specific Gravity, Urine >1.030 (H) 1.005 - 1.030   pH 5.5 5.0 - 8.0   Glucose, UA NEGATIVE NEGATIVE mg/dL   Hgb urine dipstick NEGATIVE NEGATIVE   Bilirubin Urine MODERATE (A) NEGATIVE   Ketones, ur NEGATIVE NEGATIVE mg/dL   Protein, ur NEGATIVE NEGATIVE mg/dL   Nitrite NEGATIVE NEGATIVE   Leukocytes,Ua NEGATIVE NEGATIVE    Comment: Microscopic not done on urines with negative protein, blood, leukocytes, nitrite, or glucose < 500 mg/dL. Performed at [facility], [place] [address] [place], [place] [address]   Urine rapid drug screen (hosp performed)     Status: None   Collection Time: 07/31/21  7:41 PM  Result Value Ref Range   Opiates NONE DETECTED NONE DETECTED   Cocaine NONE DETECTED NONE DETECTED   Benzodiazepines NONE DETECTED NONE DETECTED   Amphetamines NONE DETECTED NONE DETECTED   Tetrahydrocannabinol NONE DETECTED NONE DETECTED   Barbiturates NONE DETECTED NONE DETECTED    Comment: (NOTE) DRUG SCREEN FOR MEDICAL PURPOSES ONLY.  IF CONFIRMATION IS NEEDED FOR ANY PURPOSE, NOTIFY LAB WITHIN 5 DAYS.  LOWEST DETECTABLE LIMITS FOR URINE DRUG SCREEN Drug Class                     Cutoff (ng/mL) Amphetamine and metabolites    1000 Barbiturate and metabolites    200 Benzodiazepine                 829 Tricyclics and metabolites     300 Opiates and metabolites        300 Cocaine and metabolites        300 THC  50 Performed at [facility],  [place] [name], [place], [place] [address]   Resp Panel by RT-PCR (Flu A&B, Covid) Nasopharyngeal Swab     Status: None   Collection Time: 07/31/21  9:13 PM   Specimen: Nasopharyngeal Swab; Nasopharyngeal(NP) swabs in vial transport medium  Result Value Ref Range   SARS Coronavirus 2 by RT PCR NEGATIVE NEGATIVE    Comment: (NOTE) SARS-CoV-2 target nucleic acids are NOT DETECTED.  The SARS-CoV-2 RNA is generally detectable in upper respiratory specimens during the acute phase of infection. The lowest concentration of SARS-CoV-2 viral copies this assay can detect is 138 copies/mL. A negative result does not preclude SARS-Cov-2 infection and should not be used as the sole basis for treatment or other patient management decisions. A negative result may occur with  improper specimen collection/handling, submission of specimen other than nasopharyngeal swab, presence of viral mutation(s) within the areas targeted by this assay, and inadequate number of viral copies(<138 copies/mL). A negative result must be combined with clinical observations, patient history, and epidemiological information. The expected result is Negative.  Fact Sheet for Patients:  EntrepreneurPulse.com.au  Fact Sheet for Healthcare Providers:  IncredibleEmployment.be  This test is no t yet approved or cleared by the [place] FDA and  has been authorized for detection and/or diagnosis of SARS-CoV-2 by FDA under an Emergency Use Authorization (EUA). This EUA will remain  in effect (meaning this test can be used) for the duration of the COVID-19 declaration under Section 564(b)(1) of the Act, 21 U.S.C.section 360bbb-3(b)(1), unless the authorization is terminated  or revoked sooner.       Influenza A by PCR NEGATIVE NEGATIVE   Influenza B by PCR NEGATIVE NEGATIVE    Comment: (NOTE) The Xpert Xpress SARS-CoV-2/FLU/RSV plus assay is intended as an aid in the diagnosis of  influenza from Nasopharyngeal swab specimens and should not be used as a sole basis for treatment. Nasal washings and aspirates are unacceptable for Xpert Xpress SARS-CoV-2/FLU/RSV testing.  Fact Sheet for Patients: EntrepreneurPulse.com.au  Fact Sheet for Healthcare Providers: IncredibleEmployment.be  This test is not yet approved or cleared by the [place] FDA and has been authorized for detection and/or diagnosis of SARS-CoV-2 by FDA under an Emergency Use Authorization (EUA). This EUA will remain in effect (meaning this test can be used) for the duration of the COVID-19 declaration under Section 564(b)(1) of the Act, 21 U.S.C. section 360bbb-3(b)(1), unless the authorization is terminated or revoked.  Performed at [facility], [place] [address], [place], [place] [address]   Glucose, capillary     Status: Abnormal   Collection Time: 07/31/21 10:50 PM  Result Value Ref Range   Glucose-Capillary 140 (H) 70 - 99 mg/dL    Comment: Glucose reference range applies only to samples taken after fasting for at least 8 hours.  HIV Antibody (routine testing w rflx)     Status: None   Collection Time: 07/31/21 11:56 PM  Result Value Ref Range   HIV Screen 4th Generation wRfx Non Reactive Non Reactive    Comment: Performed at [facility], [place] [address], [place], [place] [address]  TSH     Status: None   Collection Time: 07/31/21 11:56 PM  Result Value Ref Range   TSH 3.703 0.350 - 4.500 uIU/mL    Comment: Performed by a 3rd Generation assay with a functional sensitivity of <=0.01 uIU/mL. Performed at [facility], [place] [address], [place],  [address]  Ammonia     Status: None   Collection Time: 07/31/21 11:56 PM  Result Value Ref Range   Ammonia 32 9 - 35 umol/L    Comment: Performed at [facility], [place] [address] [place], [place] [address]  Hemoglobin A1c     Status: Abnormal   Collection Time: 07/31/21 11:56  PM  Result Value Ref Range   Hgb A1c MFr Bld 6.3 (H) 4.8 - 5.6 %    Comment: (NOTE) Pre diabetes:          5.7%-6.4%  Diabetes:              >6.4%  Glycemic control for   <7.0% adults with diabetes    Mean Plasma Glucose 134.11 mg/dL    Comment: Performed at [facility] [address] [place], [place] [address]  Basic metabolic panel     Status: Abnormal   Collection Time: 07/31/21 11:56 PM  Result Value Ref Range   Sodium 155 (H) 135 - 145 mmol/L   Potassium 3.6 3.5 - 5.1 mmol/L   Chloride 117 (H) 98 - 111 mmol/L   CO2 26 22 - 32 mmol/L   Glucose, Bld 148 (H) 70 - 99 mg/dL    Comment: Glucose reference range applies only to samples taken after fasting for at least 8 hours.   BUN 42 (H) 8 - 23 mg/dL   Creatinine, Ser 1.85 (H) 0.44 - 1.00 mg/dL   Calcium 9.3 8.9 - 10.3 mg/dL   GFR, Estimated 29 (L) >60 mL/min    Comment: (NOTE) Calculated using the CKD-EPI Creatinine Equation (2021)    Anion gap 12 5 - 15    Comment: Performed at [facility] [address], [place], [place] [address]  CBC     Status: None   Collection Time: 08/01/21  3:04 AM  Result Value Ref Range   WBC 9.8 4.0 - 10.5 K/uL   RBC 4.92 3.87 - 5.11 MIL/uL   Hemoglobin 13.9 12.0 - 15.0 g/dL   HCT 45.5 36.0 - 46.0 %   MCV 92.5 80.0 - 100.0 fL   MCH 28.3 26.0 - 34.0 pg   MCHC 30.5 30.0 - 36.0 g/dL   RDW 13.6 11.5 - 15.5 %   Platelets 376 150 - 400 K/uL   nRBC 0.0 0.0 - 0.2 %    Comment: Performed at [facility], [place] [address] [place], [place] [address]  Basic metabolic panel     Status: Abnormal   Collection Time: 08/01/21  3:04 AM  Result Value Ref Range   Sodium 157 (H) 135 - 145 mmol/L   Potassium 3.5 3.5 - 5.1 mmol/L   Chloride 119 (H) 98 - 111 mmol/L   CO2 26 22 - 32 mmol/L   Glucose, Bld 114 (H) 70 - 99 mg/dL    Comment: Glucose reference range applies only to samples taken after fasting for at least 8 hours.   BUN 39 (H) 8 - 23 mg/dL   Creatinine, Ser 1.86 (H) 0.44 -  1.00 mg/dL   Calcium 9.3 8.9 - 10.3 mg/dL   GFR, Estimated 29 (L) >60 mL/min    Comment: (NOTE) Calculated using the CKD-EPI Creatinine Equation (2021)    Anion gap 12 5 - 15    Comment: Performed at [facility] [address], [place], [place] [address]  Vitamin B12     Status: None   Collection Time: 08/01/21  3:04 AM  Result Value Ref Range   Vitamin B-12 527 180 - 914 pg/mL    Comment: (NOTE) This assay is not validated for testing neonatal or myeloproliferative syndrome specimens for Vitamin B12 levels. Performed at [facility], [place] [address] [place], [place] [address]   Glucose, capillary     Status: Abnormal   Collection Time: 08/01/21  3:40 AM  Result Value Ref Range   Glucose-Capillary 107 (H) 70 - 99 mg/dL    Comment: Glucose reference range applies only to samples taken after fasting for at least 8 hours.   Comment 1 Notify RN    Comment 2 Document in Chart   Basic metabolic panel     Status: Abnormal   Collection Time: 08/01/21  6:54 AM  Result Value Ref Range   Sodium 156 (H) 135 - 145 mmol/L   Potassium 3.2 (L) 3.5 - 5.1 mmol/L   Chloride 119 (H) 98 - 111 mmol/L   CO2 24 22 - 32 mmol/L   Glucose, Bld 136 (H) 70 - 99 mg/dL    Comment: Glucose reference range applies only to samples taken after fasting for at least 8 hours.   BUN 39 (H) 8 - 23 mg/dL   Creatinine, Ser 1.73 (H) 0.44 - 1.00 mg/dL   Calcium 9.1 8.9 - 10.3 mg/dL   GFR, Estimated 32 (L) >60 mL/min    Comment: (NOTE) Calculated using the CKD-EPI Creatinine Equation (2021)    Anion gap 13 5 - 15    Comment: Performed at [facility] [address] [place], [place] [address]  Glucose, capillary     Status: Abnormal   Collection Time: 08/01/21  8:09 AM  Result Value Ref Range   Glucose-Capillary 137 (H) 70 - 99 mg/dL    Comment: Glucose reference range applies only to samples taken after fasting for at least 8 hours.  Glucose, capillary     Status: Abnormal   Collection  Time: 08/01/21 12:02 PM  Result Value Ref Range   Glucose-Capillary 168 (H) 70 - 99 mg/dL    Comment: Glucose reference range applies only to samples taken after fasting for at least 8 hours.    CT HEAD WO CONTRAST (5MM)  Result Date: 08/01/2021 CLINICAL DATA:  Delirium, altered mental status EXAM: CT HEAD WITHOUT CONTRAST TECHNIQUE: Contiguous axial images were obtained from the base of the skull through the vertex without intravenous contrast. RADIATION DOSE REDUCTION: This exam was performed according to the departmental dose-optimization program which includes automated exposure control, adjustment of the mA and/or kV according to patient size and/or use of iterative reconstruction technique. COMPARISON:  09/09/2020 FINDINGS: Brain: Ill-defined hyperdensity in the left temporal lobe (series 2, image 11 and series 5, image 44), concerning for subarachnoid hemorrhage. No evidence of acute infarction, hemorrhage, mass, mass effect, or midline shift. No hydrocephalus. Periventricular white matter changes, likely the sequela of chronic small vessel ischemic disease. Vascular: No hyperdense vessel. Skull: Normal. Negative for fracture or focal lesion. Sinuses/Orbits: No acute finding. Other: The mastoid air cells are well aerated. IMPRESSION: IMPRESSION Hyperdensity overlying the posterior left temporal lobe, which is concerning for subarachnoid hemorrhage. An MRI is recommended for further evaluation. These results were called by telephone at the time of interpretation on 08/01/2021 at 1:53 am to provider [facility] , who verbally acknowledged these results. Electronically Signed   By: Merilyn Baba M.D.   On: 08/01/2021 01:53   MR BRAIN WO CONTRAST  Result Date: 08/01/2021 CLINICAL DATA:  71 year old female with altered mental status. Vague hyperdensity in the posterior left temporal lobe on head CT 0133 hours today possibly subarachnoid hemorrhage. EXAM: MRI HEAD WITHOUT CONTRAST TECHNIQUE:  Multiplanar, multiecho pulse sequences of the brain and surrounding structures were obtained without intravenous contrast. COMPARISON:  Head CT 0133 hours. Brain MRI 05/01/2013. FINDINGS: The examination had to be discontinued prior to completion due to combative patient. Axial and coronal DWI, axial T2 and FLAIR sequences only were obtained, and are intermittently motion degraded. Brain: Generalized cerebral volume loss since 2014. No midline shift or ventriculomegaly. Normal basilar cisterns. Up to 3 cm area of heterogeneously increased T2 and FLAIR hyperintensity corresponding to the vague hyperdensity at the posterior left temporal lobe on CT this morning. And on DWI there is conspicuous increased signal hyperintensity, although seems to have both restricted and facilitated diffusion on ADC. See series 3, image 26, series 300, image 26, series 5, image 14. Cortical and subcortical white matter involvement, some of the DWI signal abnormality is gyriform. Mild if any regional mass effect (series 8, image 15). No other convincing diffusion abnormality, but there is evidence of similar abnormal cortical T2/FLAIR hyperintensity in the contralateral posterior right temporal lobe (series 7 image 11 and series 8, image 11). But no definite additional posterior circulation cortical involvement. Nonspecific periatrial white matter hyperintensity in both hemispheres. Deep gray matter nuclei, brainstem and cerebellum grossly negative. Vascular: Major intracranial vascular flow voids are stable since 2014. Skull and upper cervical spine: Not evaluated. Sinuses/Orbits: Paranasal sinuses and mastoids are clear. Negative orbits. Other: Visible internal auditory structures appear normal. Negative visible scalp. IMPRESSION: 1. Combative patient, truncated exam. DWI, axial T2 and FLAIR only obtained and somewhat motion degraded. 2. Abnormality in the posterior left temporal lobe appears to be parenchymal and at least partially  restricted on diffusion, although etiology uncertain on this limited exam. No strong evidence of hemorrhage, and at this time top differential considerations for this area include Hypercellular Tumor and Infarct. No significant mass effect. However, there is also evidence of similar contralateral posterior right temporal lobe cortical and subcortical T2 and FLAIR signal abnormality, although without corresponding diffusion change. PRES was also considered, but unlikely due to apparent sparing of other areas. Overall recommend repeat MRI Head Without And With Contrast, with sedation if necessary to control for motion. 3. Evidence of preserved flow in the intracranial large vessels. Generalized cerebral volume loss since 2014. Electronically Signed   By: Genevie Ann M.D.   On: 08/01/2021 05:32   DG CHEST PORT 1 VIEW  Result Date: 08/01/2021 CLINICAL DATA:  Metal screening EXAM: PORTABLE CHEST 1 VIEW COMPARISON:  09/10/2020 FINDINGS: Lungs are clear. No pneumothorax or pleural effusion. Cardiac size within normal limits. Pulmonary vascularity is normal. Surgical clips are seen within the right axilla. Right humeral ORIF hardware partially visualized. No unexpected metallic foreign body noted within the visualized thorax. IMPRESSION: No unexpected metallic foreign body identified within the visualized thorax. Electronically Signed   By: Fidela Salisbury M.D.   On: 08/01/2021 03:40   DG Abd Portable 1V  Result Date: 08/01/2021 CLINICAL DATA:  Metal screening, pre MRI EXAM: PORTABLE ABDOMEN - 1 VIEW COMPARISON:  None. FINDINGS: Normal abdominal gas pattern. No gross free intraperitoneal gas. Visualized lung bases are clear. No organomegaly. Multiple rounded calcific densities seen within the right paraspinal region overlying the right fourth transverse process may represent calculi within the expected ureteropelvic junction. No unexpected metallic foreign body within the abdomen. IMPRESSION: No unexpected metallic foreign  body within the visualized abdomen. Possible right urolithiasis. Electronically Signed   By: Fidela Salisbury M.D.   On: 08/01/2021 03:43    Review of systems not obtained due to patient factors. Blood pressure 105/64, pulse 66, temperature 97.9 F (36.6 C), temperature source Axillary, resp. rate 19, height 5' 4.5" (1.638 m), weight 50.1 kg, SpO2 100 %. Patient is somnolent but she will awaken.  She speaks unintelligibly.  She has no evidence of facial asymmetry.  She moves all 4 extremities equally.  Examination head ears eyes nose and throat is unremarkable her chest and abdomen are benign appearing extremities are free of major deformity.  Assessment/Plan: Patient with brain lesions not consistent with neoplastic disease or hemorrhage.  Question toxic metabolic change versus encephalitis versus possible infarct.  Patient should have repeat MRI scan when available.  I think LP analysis of CSF is still worthwhile particularly as there could be some question of encephalitis.  Cooper Render Jakel Alphin 08/01/2021, 12:27 PM
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
## 2021-08-01 NOTE — Progress Notes (Addendum)
Notified by radiologist that CT head is showing hyperdensity overlying the posterior left temporal lobe concerning for subarachnoid hemorrhage, recommending MRI for further evaluation.  I have ordered stat brain MRI.  Informed by RN that patient is restless/agitated again and will not be able to hold still for MRI.  Reportedly Ativan given in the ED which did not help but Haldol did.  Additional Haldol ordered for sedation so MRI can be done as soon as possible. Patient is not on anticoagulation.  Neurosurgery urgently consulted, spoke to Viona Gilmore NP who discussed the case with Dr. Trenton Gammon.  Recommending LP and brain MRI.  No radiologist onsite tonight to do LP.  Informed by neurosurgery that they are not planning on doing any urgent intervention/surgery overnight and okay with waiting until morning for LP.  Neurosurgery will consult in the morning.  Discussed with neurology, Dr. Lorrin Goodell will do LP early in the morning around 7 AM, really appreciate help.  Addendum: Also discussed with PCCM.  Patient is being transferred to progressive care unit for frequent neurochecks and close monitoring.  She is remains normotensive, goal SBP <160 or MAP <110.

## 2021-08-01 NOTE — Evaluation (Signed)
Occupational Therapy Evaluation Patient Details Name: Marie [name] MRN: [id number] DOB: 04-15-1951 Today's Date: 08/01/2021   History of Present Illness Marie [name] is a 71 y.o. female who presented to the ED via EMS and law enforcement under IVC.  Pt was found by granddaughter covered in bed bugs, lice, roaches, and feces in which pt was eating it. Pt lives with spouse however spouse was admitted to the hospital for the last 4 days and she was home alone. MCN:[id number] with behavioral disturbance, depression, type II diabetes not on medications, hyperlipidemia, hypertension, hypothyroidism, mild nonobstructive CAD per cath done in May 2021, vitamin D deficiency, aortic atherosclerosis, CKD stage IIIa, history of breast cancer   Clinical Impression   Pt admitted for concerns listed above. Unable to obtain PLOF due to pt cognition. At this time pt is confused, unable to follow any commands, and oriented to self only. She is requiring total assist for all ADL's due to cognition. Recommending SNF level care, as pt has poor, unsafe living conditions, and a poor home situation where family is unable to provide safe care. OT will follow to address functional mobility, cognition, and ADL performance.       Recommendations for follow up therapy are one component of a multi-disciplinary discharge planning process, led by the attending physician.  Recommendations may be updated based on patient status, additional functional criteria and insurance authorization.   Follow Up Recommendations  Skilled nursing-short term rehab (<3 hours/day)    Assistance Recommended at Discharge Frequent or constant Supervision/Assistance  Patient can return home with the following A lot of help with walking and/or transfers;A lot of help with bathing/dressing/bathroom;Assistance with cooking/housework;Direct supervision/assist for medications management;Direct supervision/assist for financial management;Assistance with  feeding    Functional Status Assessment  Patient has had a recent decline in their functional status and demonstrates the ability to make significant improvements in function in a reasonable and predictable amount of time.  Equipment Recommendations  Other (comment) (TBD)    Recommendations for Other Services       Precautions / Restrictions Precautions Precautions: Fall;Other (comment) Precaution Comments: pt with observed bed bugs and lice infesting pt's hair and body Restrictions Weight Bearing Restrictions: No      Mobility Bed Mobility Overal bed mobility: Needs Assistance Bed Mobility: Sidelying to Sit (pt received in L sidelying)   Sidelying to sit: Max assist       General bed mobility comments: max verbal and tactile cues for task at hand, pt with increased level of alertness once EOB versus lying down    Transfers                   General transfer comment: pt initiated trying to stand up however pt with significant retropulsion when trying to stand and was unable      Balance Overall balance assessment: Needs assistance Sitting-balance support: Feet supported, No upper extremity supported Sitting balance-Leahy Scale: Fair Sitting balance - Comments: pt able to maintain EOB balance with close min guard with no UE support       Standing balance comment: unable to stand                           ADL either performed or assessed with clinical judgement   ADL Overall ADL's : Needs assistance/impaired  General ADL Comments: At this time, due to cognition, pt requiring total assist     Vision Baseline Vision/History: 0 No visual deficits Ability to See in Adequate Light: 0 Adequate Patient Visual Report: No change from baseline Additional Comments: Unable to assess vision as pt kept her eyes closed for most of the session and followed no commands     Perception  Perception Comments: Pt seeing things that aren't there and not following any commands   Praxis      Pertinent Vitals/Pain Pain Assessment Pain Assessment: No/denies pain Faces Pain Scale: No hurt     Hand Dominance Right   Extremity/Trunk Assessment Upper Extremity Assessment Upper Extremity Assessment: Generalized weakness   Lower Extremity Assessment Lower Extremity Assessment: Defer to PT evaluation   Cervical / Trunk Assessment Cervical / Trunk Assessment: Kyphotic   Communication Communication Communication: Expressive difficulties   Cognition Arousal/Alertness: Lethargic Behavior During Therapy: Flat affect Overall Cognitive Status: History of cognitive impairments - at baseline                                 General Comments: no family present to give PLOF. Pt was lethargic and sleeping upon arrival, once assisted pt to EOB pt did open eyes several times but didn't maintain eyes opened. Pt only answered to name, otherwise not oriented. Pt reaching for things that aren't there, unable to follow simple commands, constantly itching all over her body     General Comments  pt infested with bed bugs and lice. Pt with poor skin integrity and hygiene however unaware    Exercises     Shoulder Instructions      Home Living Family/patient expects to be discharged to:: Unsure                                 Additional Comments: pt came for poor, unsafe living situation, will most likely need placement      Prior Functioning/Environment Prior Level of Function : Other (comment) (unsure, unable to get a hold of family)             Mobility Comments: unsure as pt was in poor condition upon being brought in hospital, suspect pt layed in the same spot for 4 days ADLs Comments: per chart spouse manages her ADLs and medications due to her dementia        OT Problem List: Decreased strength;Decreased range of motion;Decreased activity  tolerance;Impaired balance (sitting and/or standing);Impaired vision/perception;Decreased coordination;Decreased cognition;Decreased safety awareness;Decreased knowledge of use of DME or AE      OT Treatment/Interventions: Self-care/ADL training;Therapeutic exercise;Energy conservation;DME and/or AE instruction;Therapeutic activities;Cognitive remediation/compensation;Patient/family education;Balance training    OT Goals(Current goals can be found in the care plan section) Acute Rehab OT Goals Patient Stated Goal: None stated OT Goal Formulation: Patient unable to participate in goal setting Time For Goal Achievement: 08/15/21 Potential to Achieve Goals: Fair ADL Goals Additional ADL Goal #1: Pt will follow 1 step commands 50% of the time during session. Additional ADL Goal #2: Pt will attend to tasks presented to her for 3+ mins.  OT Frequency: Min 2X/week    Co-evaluation PT/OT/SLP Co-Evaluation/Treatment: Yes Reason for Co-Treatment: Complexity of the patient's impairments (multi-system involvement) PT goals addressed during session: Mobility/safety with mobility OT goals addressed during session: Strengthening/ROM;ADL's and self-care      AM-PAC OT "6 Clicks" Daily Activity  Outcome Measure Help from another person eating meals?: Total Help from another person taking care of personal grooming?: Total Help from another person toileting, which includes using toliet, bedpan, or urinal?: Total Help from another person bathing (including washing, rinsing, drying)?: Total Help from another person to put on and taking off regular upper body clothing?: Total Help from another person to put on and taking off regular lower body clothing?: Total 6 Click Score: 6   End of Session Nurse Communication: Mobility status  Activity Tolerance: Other (comment) (Limtied by cognition) Patient left: in bed;with call bell/phone within reach;with bed alarm set;with nursing/sitter in room  OT Visit  Diagnosis: Unsteadiness on feet (R26.81);Other abnormalities of gait and mobility (R26.89);Muscle weakness (generalized) (M62.81);Other symptoms and signs involving cognitive function                Time: 4462-8638 OT Time Calculation (min): 29 min Charges:  OT General Charges $OT Visit: 1 Visit OT Evaluation $OT Eval Moderate Complexity: 1 Mod  Zamar Odwyer H., OTR/L Acute Rehabilitation  Janaisha Tolsma Elane Yolanda Bonine 08/01/2021, 5:25 PM

## 2021-08-02 ENCOUNTER — Inpatient Hospital Stay (HOSPITAL_COMMUNITY): Payer: Medicare PPO | Admitting: Anesthesiology

## 2021-08-02 ENCOUNTER — Inpatient Hospital Stay (HOSPITAL_COMMUNITY): Payer: Medicare PPO

## 2021-08-02 ENCOUNTER — Encounter (HOSPITAL_COMMUNITY)
Admission: EM | Disposition: A | Discharge status: Skilled Nursing Facility | Payer: Self-pay | Source: Home / Self Care | Attending: Internal Medicine

## 2021-08-02 DIAGNOSIS — G060 Intracranial abscess and granuloma: Secondary | ICD-10-CM

## 2021-08-02 HISTORY — PX: RADIOLOGY WITH ANESTHESIA: SHX6223

## 2021-08-02 LAB — CBC WITH DIFFERENTIAL/PLATELET
Abs Immature Granulocytes: 0.02 10*3/uL (ref 0.00–0.07)
Basophils Absolute: 0.1 10*3/uL (ref 0.0–0.1)
Basophils Relative: 1 %
Eosinophils Absolute: 1.1 10*3/uL — ABNORMAL HIGH (ref 0.0–0.5)
Eosinophils Relative: 16 %
HCT: 40.1 % (ref 36.0–46.0)
Hemoglobin: 12.5 g/dL (ref 12.0–15.0)
Immature Granulocytes: 0 %
Lymphocytes Relative: 12 %
Lymphs Abs: 0.8 10*3/uL (ref 0.7–4.0)
MCH: 28.8 pg (ref 26.0–34.0)
MCHC: 31.2 g/dL (ref 30.0–36.0)
MCV: 92.4 fL (ref 80.0–100.0)
Monocytes Absolute: 0.4 10*3/uL (ref 0.1–1.0)
Monocytes Relative: 6 %
Neutro Abs: 4.5 10*3/uL (ref 1.7–7.7)
Neutrophils Relative %: 65 %
Platelets: 293 10*3/uL (ref 150–400)
RBC: 4.34 MIL/uL (ref 3.87–5.11)
RDW: 13.2 % (ref 11.5–15.5)
WBC: 6.9 10*3/uL (ref 4.0–10.5)
nRBC: 0 % (ref 0.0–0.2)

## 2021-08-02 LAB — BASIC METABOLIC PANEL
Anion gap: 9 (ref 5–15)
BUN: 25 mg/dL — ABNORMAL HIGH (ref 8–23)
CO2: 27 mmol/L (ref 22–32)
Calcium: 8.8 mg/dL — ABNORMAL LOW (ref 8.9–10.3)
Chloride: 109 mmol/L (ref 98–111)
Creatinine, Ser: 1.36 mg/dL — ABNORMAL HIGH (ref 0.44–1.00)
GFR, Estimated: 42 mL/min — ABNORMAL LOW (ref >60)
Glucose, Bld: 126 mg/dL — ABNORMAL HIGH (ref 70–99)
Potassium: 3.4 mmol/L — ABNORMAL LOW (ref 3.5–5.1)
Sodium: 145 mmol/L (ref 135–145)

## 2021-08-02 LAB — GLUCOSE, CAPILLARY
Glucose-Capillary: 111 mg/dL — ABNORMAL HIGH (ref 70–99)
Glucose-Capillary: 115 mg/dL — ABNORMAL HIGH (ref 70–99)
Glucose-Capillary: 127 mg/dL — ABNORMAL HIGH (ref 70–99)
Glucose-Capillary: 128 mg/dL — ABNORMAL HIGH (ref 70–99)
Glucose-Capillary: 137 mg/dL — ABNORMAL HIGH (ref 70–99)
Glucose-Capillary: 141 mg/dL — ABNORMAL HIGH (ref 70–99)
Glucose-Capillary: 163 mg/dL — ABNORMAL HIGH (ref 70–99)

## 2021-08-02 SURGERY — MRI WITH ANESTHESIA
Anesthesia: General

## 2021-08-02 MED ORDER — POTASSIUM CHLORIDE 10 MEQ/100ML IV SOLN
10.0000 meq | INTRAVENOUS | Status: AC
  Administered 2021-08-02 (×4): 10 meq via INTRAVENOUS
  Filled 2021-08-02 (×4): qty 100

## 2021-08-02 MED ORDER — VANCOMYCIN HCL IN DEXTROSE 1-5 GM/200ML-% IV SOLN
1000.0000 mg | Freq: Once | INTRAVENOUS | Status: AC
  Administered 2021-08-03: 1000 mg via INTRAVENOUS
  Filled 2021-08-02 (×2): qty 200

## 2021-08-02 MED ORDER — VANCOMYCIN HCL 500 MG/100ML IV SOLN
500.0000 mg | INTRAVENOUS | Status: DC
  Administered 2021-08-03: 500 mg via INTRAVENOUS
  Filled 2021-08-02: qty 100

## 2021-08-02 MED ORDER — SODIUM CHLORIDE 0.9 % IV SOLN
2.0000 g | Freq: Two times a day (BID) | INTRAVENOUS | Status: DC
  Administered 2021-08-02 – 2021-08-04 (×4): 2 g via INTRAVENOUS
  Filled 2021-08-02 (×5): qty 20

## 2021-08-02 MED ORDER — FENTANYL CITRATE (PF) 100 MCG/2ML IJ SOLN: 25.0000 ug | INTRAMUSCULAR | Status: DC | PRN

## 2021-08-02 MED ORDER — DEXTROSE 5 % IV SOLN
10.0000 mg/kg | Freq: Two times a day (BID) | INTRAVENOUS | Status: DC
  Administered 2021-08-02 – 2021-08-08 (×11): 500 mg via INTRAVENOUS
  Filled 2021-08-02 (×13): qty 10

## 2021-08-02 MED ORDER — GADOBUTROL 1 MMOL/ML IV SOLN
5.0000 mL | Freq: Once | INTRAVENOUS | Status: AC | PRN
  Administered 2021-08-02: 5 mL via INTRAVENOUS

## 2021-08-02 MED ORDER — ACETAMINOPHEN 10 MG/ML IV SOLN: 1000.0000 mg | Freq: Once | INTRAVENOUS | Status: DC | PRN

## 2021-08-02 MED ORDER — METRONIDAZOLE 500 MG/100ML IV SOLN
500.0000 mg | Freq: Three times a day (TID) | INTRAVENOUS | Status: DC
  Administered 2021-08-03 – 2021-08-04 (×5): 500 mg via INTRAVENOUS
  Filled 2021-08-02 (×5): qty 100

## 2021-08-02 MED ORDER — HALOPERIDOL LACTATE 5 MG/ML IJ SOLN
5.0000 mg | Freq: Once | INTRAMUSCULAR | Status: AC
  Administered 2021-08-02: 5 mg via INTRAVENOUS
  Filled 2021-08-02: qty 1

## 2021-08-02 MED ORDER — DEXTROSE-NACL 5-0.9 % IV SOLN: INTRAVENOUS | Status: DC

## 2021-08-02 MED ORDER — SODIUM CHLORIDE 0.9 % IV SOLN: INTRAVENOUS | Status: DC
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
## 2021-08-02 NOTE — Progress Notes (Signed)
Pharmacy Antibiotic Note  Marie [name] is a 71 y.o. female admitted on 07/31/2021 with encephalopathy.  Pharmacy has been consulted for vancomycin dosing for CNS infection with possible abscess and acyclovir dosing for possible HSV encephalitis. She is also on ceftriaxone and metronidazole. She is afebrile, WBC are normal, and SCr is 1.36 (baseline ~1.1-1.3).   Plan: Vancomycin 1000 mg IV load then 500 mg IV q24h    Goal trough 15-20 mcg/ml Acyclovir 500 mg IV q12h (~10 mg/kg/dose) Ceftriaxone 2 g IV q12h per MD Metronidazole 500 mg IV q8h per MD Monitor renal function, clinical progress, cultures/sensitivities F/U LOT and de-escalate as able Vancomycin levels as clinically indicated   Height: 5' 4.5" (163.8 cm) Weight: 50.1 kg (110 lb 7.2 oz) (bed was zeroed immediately prior to pt arrival) IBW/kg (Calculated) : 55.85  Temp (24hrs), Avg:97.8 F (36.6 C), Min:97.5 F (36.4 C), Max:98.6 F (37 C)  Recent Labs  Lab 07/31/21 1941 07/31/21 2356 08/01/21 0304 08/01/21 0654 08/01/21 1305 08/01/21 2014 08/02/21 0809  WBC 9.1  --  9.8  --   --   --  6.9  CREATININE 2.00*   < > 1.86* 1.73* 1.54* 1.41* 1.36*   < > = values in this interval not displayed.    Estimated Creatinine Clearance: 30.9 mL/min (A) (by C-G formula based on SCr of 1.36 mg/dL (H)).    Allergies  Allergen Reactions   Penicillins Rash    Swelling in face - last 10 years    Antimicrobials this admission: Vancomycin 2/6 >>  Ceftriaxone 2/6 >>  Acyclovir 2/6 >> Metronidazole 2/6 >>  Dose adjustments this admission:   Microbiology results:   Thank you for involving pharmacy in this patient's care.  Renold Genta, PharmD, BCPS Clinical Pharmacist Clinical phone for 08/02/2021 until 11p is [phone] 08/02/2021 10:41 PM  **Pharmacist phone directory can be found on [email or web address] listed under [place]**

## 2021-08-02 NOTE — Anesthesia Postprocedure Evaluation (Signed)
Anesthesia Post Note  Patient: Marie [name]  Procedure(s) Performed: MRI WITH ANESTHESIA     Patient location during evaluation: PACU Anesthesia Type: General Level of consciousness: awake and alert Pain management: pain level controlled Vital Signs Assessment: post-procedure vital signs reviewed and stable Respiratory status: spontaneous breathing, nonlabored ventilation, respiratory function stable and patient connected to nasal cannula oxygen Cardiovascular status: blood pressure returned to baseline and stable Postop Assessment: no apparent nausea or vomiting Anesthetic complications: no   No notable events documented.  Last Vitals:  Vitals:   08/02/21 1555 08/02/21 1610  BP: 130/64 (!) 100/49  Pulse: 78 65  Resp: 18 18  Temp:  36.4 C  SpO2: 100% 100%    Last Pain:  Vitals:   08/02/21 1610  TempSrc:   PainSc: 0-No pain                 Belenda Cruise P Ysabela Keisler

## 2021-08-02 NOTE — Progress Notes (Signed)
PROGRESS NOTE  Marie [name] UMP:[id number] DOB: 1951/05/16 DOA: 07/31/2021 PCP: [name], MD  HPI/Recap of past 24 hours: Marie [name] is a 70 y.o. female with medical history significant of dementia with behavioral disturbance, depression, DM type 2 not on medications, HLD, HTN, hypothyroidism, mild nonobstructive CAD per cath done in May 2021, CKD stage IIIa, history of breast cancer presented to the ED via EMS and law enforcement under IVC. Patient normally lives with her husband at home who manages her ADLs. Husband was admitted to the hospital for the last 4 days and she was alone at home. Per [employer], family is unreliable and has mental health issues of their own. Husband was discharged from the hospital today and granddaughter came home before he arrived to find the patient aggravated, combative, eating her own feces and covered in feces, roaches, lice, and bedbugs.  Granddaughter has taken out IVC papers.  Vital signs stable on arrival to the ED.  Patient was combative and was given Ativan and Haldol.  Labs fairly stable except for sodium 156, BUN 41, creatinine 2.0 (baseline 1.0-1.3), UA without signs of infection. Ethanol and UDS negative. CT head concerning for subarachnoid hemorrhage. Neurosurgery consulted and requested MRI and LP. MRI attempted after patient received Haldol, but patient still combative and noted motion degraded, recommending repeat MRI with sedation for further evaluation. Neurology attempted to contact patient for consent for LP, with no luck. Patient admitted for further management.     Patient seen and examined. Refused to speak with me, appears comfortable. Still not following commands    Assessment/Plan: Principal Problem:   Acute metabolic encephalopathy Active Problems:   Type 2 diabetes mellitus with stage 3a chronic kidney disease, without long-term current use of insulin (HCC)   Acute renal failure superimposed on stage 3a chronic kidney  disease (HCC)   Hypernatremia   Social problem   Acute metabolic encephalopathy- (present on admission) Dementia with behavioral disturbance Likely due to metabolic derangements (dehydration/hypernatremia/AKI) with underlying dementia  Currently afebrile, with no leukocytosis UA without signs of infection TSH, Vit B12, ammonia all WNL Ethanol and UDS negative CXR unremarkable Monitor closely, delirium precautions   Hypernatremia- (present on admission) Improved Likely due to severe dehydration, sodium 156 on presentation Continue IVF Daily BMP  Possible subarachnoid hemorrhage seen on CT CT head showed above Neurosurgery consulted, recommend MRI and LP Initial MRI inconclusive due to motion, patient will require sedation for repeat MRI Repeat MRI pending LP pending   AKI on stage 3a chronic kidney disease On admission, BUN 41, creatinine 2.0 (baseline 1.0-1.3). Continue IVF Daily BMP  Hypokalemia Replace as needed Daily BMP  Type 2 DM, without long-term current use of insulin ([place])- (present on admission) A1c 6.3 Not on any medications at home SSI every 4 hours, Accu-Cheks, hypoglycemic protocol   Social problem Per report from ED staff, patient's primary caregiver is her husband who was hospitalized for the past few days.  Her daughter is also currently hospitalized and admitted to the ICU. Patient was at home with her grand children who reportedly have substance abuse issues. Another granddaughter, checked on patient, noted to be aggravated, combative, eating her own feces and covered in feces, roaches, lice, and bedbugs.  IVC paperwork was done per request from granddaughter PT/OT and social work consulted for placement       Estimated body mass index is 18.67 kg/m as calculated from the following:   Height as of this encounter: 5' 4.5" (1.638 m).  Weight as of this encounter: 50.1 kg.     Code Status: Full  Family Communication: Discussed with  grand-daughter over the phone  Disposition Plan: Status is: Inpatient Remains inpatient appropriate because: Level of care, will need SNF    Consultants: Neurosurgery Neurology  Procedures: Sedation by anesthesia  Antimicrobials: None  DVT prophylaxis:  SCDs   Objective: Vitals:   08/02/21 0815 08/02/21 1100 08/02/21 1540 08/02/21 1555  BP: 108/85 97/66 138/70 130/64  Pulse: 81 82 68 78  Resp: 18 20 20 18   Temp: 97.6 F (36.4 C) 97.7 F (36.5 C) (!) 97.5 F (36.4 C)   TempSrc: Axillary Axillary    SpO2: 100% 100% 100% 100%  Weight:      Height:        Intake/Output Summary (Last 24 hours) at 08/02/2021 1559 Last data filed at 08/02/2021 0500 Gross per 24 hour  Intake 2449.38 ml  Output --  Net 2449.38 ml   Filed Weights   07/31/21 1734 07/31/21 2300  Weight: 59 kg 50.1 kg    Exam: General: NAD, awake, alert, not responding to questions asked, unkempt Cardiovascular: S1, S2 present Respiratory: CTAB Abdomen: Soft, nontender, nondistended, bowel sounds present Musculoskeletal: No bilateral pedal edema noted Skin: Normal Psychiatry: Unable to assess Neurology: Unable to perform as not following commands     Data Reviewed: CBC: Recent Labs  Lab 07/31/21 1941 08/01/21 0304 08/02/21 0809  WBC 9.1 9.8 6.9  NEUTROABS 7.2  --  4.5  HGB 15.8* 13.9 12.5  HCT 51.1* 45.5 40.1  MCV 93.2 92.5 92.4  PLT 412* 376 599   Basic Metabolic Panel: Recent Labs  Lab 08/01/21 0304 08/01/21 0654 08/01/21 1305 08/01/21 2014 08/02/21 0809  NA 157* 156* 150* 149* 145  K 3.5 3.2* 3.8 4.3 3.4*  CL 119* 119* 117* 116* 109  CO2 26 24 23 26 27   GLUCOSE 114* 136* 187* 133* 126*  BUN 39* 39* 34* 32* 25*  CREATININE 1.86* 1.73* 1.54* 1.41* 1.36*  CALCIUM 9.3 9.1 8.9 9.0 8.8*  MG  --   --  2.2  --   --    GFR: Estimated Creatinine Clearance: 30.9 mL/min (A) (by C-G formula based on SCr of 1.36 mg/dL (H)). Liver Function Tests: Recent Labs  Lab 07/31/21 1941   AST 33  ALT 17  ALKPHOS 75  BILITOT 0.6  PROT 7.8  ALBUMIN 3.6   No results for input(s): LIPASE, AMYLASE in the last 168 hours. Recent Labs  Lab 07/31/21 2356  AMMONIA 32   Coagulation Profile: No results for input(s): INR, PROTIME in the last 168 hours. Cardiac Enzymes: No results for input(s): CKTOTAL, CKMB, CKMBINDEX, TROPONINI in the last 168 hours. BNP (last 3 results) No results for input(s): PROBNP in the last 8760 hours. HbA1C: Recent Labs    07/31/21 2356  HGBA1C 6.3*   CBG: Recent Labs  Lab 08/02/21 0005 08/02/21 0418 08/02/21 0809 08/02/21 1236 08/02/21 1542  GLUCAP 163* 128* 127* 141* 111*   Lipid Profile: No results for input(s): CHOL, HDL, LDLCALC, TRIG, CHOLHDL, LDLDIRECT in the last 72 hours. Thyroid Function Tests: Recent Labs    07/31/21 2356  TSH 3.703   Anemia Panel: Recent Labs    08/01/21 0304  VITAMINB12 527   Urine analysis:    Component Value Date/Time   COLORURINE YELLOW 07/31/2021 1941   APPEARANCEUR CLEAR 07/31/2021 1941   LABSPEC >1.030 (H) 07/31/2021 1941   PHURINE 5.5 07/31/2021 [place] NEGATIVE 07/31/2021 1941  HGBUR NEGATIVE 07/31/2021 1941   BILIRUBINUR MODERATE (A) 07/31/2021 1941   KETONESUR NEGATIVE 07/31/2021 1941   PROTEINUR NEGATIVE 07/31/2021 1941   UROBILINOGEN 0.2 07/16/2014 1212   NITRITE NEGATIVE 07/31/2021 1941   LEUKOCYTESUR NEGATIVE 07/31/2021 1941   Sepsis Labs: @LABRCNTIP (procalcitonin:4,lacticidven:4)  ) Recent Results (from the past 240 hour(s))  Resp Panel by RT-PCR (Flu A&B, Covid) Nasopharyngeal Swab     Status: None   Collection Time: 07/31/21  9:13 PM   Specimen: Nasopharyngeal Swab; Nasopharyngeal(NP) swabs in vial transport medium  Result Value Ref Range Status   SARS Coronavirus 2 by RT PCR NEGATIVE NEGATIVE Final    Comment: (NOTE) SARS-CoV-2 target nucleic acids are NOT DETECTED.  The SARS-CoV-2 RNA is generally detectable in upper respiratory specimens during the  acute phase of infection. The lowest concentration of SARS-CoV-2 viral copies this assay can detect is 138 copies/mL. A negative result does not preclude SARS-Cov-2 infection and should not be used as the sole basis for treatment or other patient management decisions. A negative result may occur with  improper specimen collection/handling, submission of specimen other than nasopharyngeal swab, presence of viral mutation(s) within the areas targeted by this assay, and inadequate number of viral copies(<138 copies/mL). A negative result must be combined with clinical observations, patient history, and epidemiological information. The expected result is Negative.  Fact Sheet for Patients:  EntrepreneurPulse.com.au  Fact Sheet for Healthcare Providers:  IncredibleEmployment.be  This test is no t yet approved or cleared by the [place] FDA and  has been authorized for detection and/or diagnosis of SARS-CoV-2 by FDA under an Emergency Use Authorization (EUA). This EUA will remain  in effect (meaning this test can be used) for the duration of the COVID-19 declaration under Section 564(b)(1) of the Act, 21 U.S.C.section 360bbb-3(b)(1), unless the authorization is terminated  or revoked sooner.       Influenza A by PCR NEGATIVE NEGATIVE Final   Influenza B by PCR NEGATIVE NEGATIVE Final    Comment: (NOTE) The Xpert Xpress SARS-CoV-2/FLU/RSV plus assay is intended as an aid in the diagnosis of influenza from Nasopharyngeal swab specimens and should not be used as a sole basis for treatment. Nasal washings and aspirates are unacceptable for Xpert Xpress SARS-CoV-2/FLU/RSV testing.  Fact Sheet for Patients: EntrepreneurPulse.com.au  Fact Sheet for Healthcare Providers: IncredibleEmployment.be  This test is not yet approved or cleared by the [place] FDA and has been authorized for detection and/or  diagnosis of SARS-CoV-2 by FDA under an Emergency Use Authorization (EUA). This EUA will remain in effect (meaning this test can be used) for the duration of the COVID-19 declaration under Section 564(b)(1) of the Act, 21 U.S.C. section 360bbb-3(b)(1), unless the authorization is terminated or revoked.  Performed at [facility], [address], [place], [place] [address]       Studies: No results found.  Scheduled Meds:  [MAR Hold] insulin aspart  0-9 Units Subcutaneous Q4H   [MAR Hold] mouth rinse  15 mL Mouth Rinse BID    Continuous Infusions:  acetaminophen     dextrose 100 mL/hr at 08/02/21 1037     LOS: 2 days     Alma Friendly, MD [facility]  If 7PM-7AM, please contact night-coverage [email or web address] 08/02/2021, 3:59 PM

## 2021-08-02 NOTE — Anesthesia Preprocedure Evaluation (Addendum)
Anesthesia Evaluation  Patient identified by MRN, date of birth, ID band Patient confused    Reviewed: Allergy & Precautions, NPO status , Patient's Chart, lab work & pertinent test results  Airway Mallampati: II  TM Distance: >3 FB Neck ROM: Full    Dental  (+) Poor Dentition   Pulmonary    Pulmonary exam normal        Cardiovascular hypertension, + CAD   Rhythm:Regular Rate:Normal     Neuro/Psych Anxiety Depression Dementia    GI/Hepatic negative GI ROS, Neg liver ROS,   Endo/Other  diabetes, Well Controlled, Type 2  Renal/GU CRFRenal disease  negative genitourinary   Musculoskeletal negative musculoskeletal ROS (+)   Abdominal Normal abdominal exam  (+)   Peds  Hematology negative hematology ROS (+)   Anesthesia Other Findings   Reproductive/Obstetrics                           Anesthesia Physical Anesthesia Plan  ASA: 4  Anesthesia Plan: General   Post-op Pain Management:    Induction: Intravenous  PONV Risk Score and Plan: 3 and Ondansetron, Dexamethasone and Treatment may vary due to age or medical condition  Airway Management Planned: Mask and LMA  Additional Equipment: None  Intra-op Plan:   Post-operative Plan: Extubation in OR  Informed Consent: I have reviewed the patients History and Physical, chart, labs and discussed the procedure including the risks, benefits and alternatives for the proposed anesthesia with the patient or authorized representative who has indicated his/her understanding and acceptance.     Consent reviewed with POA  Plan Discussed with:   Anesthesia Plan Comments: (- Consent and anesthesia plan reviewed with proxy (grand daugther).  Lab Results      Component                Value               Date                      WBC                      6.9                 08/02/2021                HGB                      12.5                 08/02/2021                HCT                      40.1                08/02/2021                MCV                      92.4                08/02/2021                PLT                      293  08/02/2021           Lab Results      Component                Value               Date                      NA                       145                 08/02/2021                K                        3.4 (L)             08/02/2021                CO2                      27                  08/02/2021                GLUCOSE                  126 (H)             08/02/2021                BUN                      25 (H)              08/02/2021                CREATININE               1.36 (H)            08/02/2021                CALCIUM                  8.8 (L)             08/02/2021                GFRNONAA                 42 (L)              08/02/2021          )       Anesthesia Quick Evaluation

## 2021-08-02 NOTE — Transfer of Care (Signed)
Immediate Anesthesia Transfer of Care Note  Patient: Marie [name]  Procedure(s) Performed: MRI WITH ANESTHESIA  Patient Location: PACU  Anesthesia Type:General  Level of Consciousness: pateint uncooperative and confused  Airway & Oxygen Therapy: Patient Spontanous Breathing and Patient connected to nasal cannula oxygen  Post-op Assessment: Report given to RN, Post -op Vital signs reviewed and stable and Patient moving all extremities X 4  Post vital signs: Reviewed and stable  Last Vitals:  Vitals Value Taken Time  BP 130/64 08/02/21 1555  Temp    Pulse 78 08/02/21 1555  Resp 18 08/02/21 1555  SpO2 100 % 08/02/21 1555    Last Pain:  Vitals:   08/02/21 1555  TempSrc:   PainSc: 0-No pain         Complications: No notable events documented.

## 2021-08-02 NOTE — Progress Notes (Addendum)
Subjective: The patient continues to be confused.   Objective: Current vital signs: BP (!) 100/49 (BP Location: Left Arm)    Pulse 65    Temp 97.6 F (36.4 C)    Resp 18    Ht 5' 4.5" (1.638 m)    Wt 50.1 kg Comment: bed was zeroed immediately prior to pt arrival   SpO2 100%    BMI 18.67 kg/m  Vital signs in last 24 hours: Temp:  [97.5 F (36.4 C)-97.8 F (36.6 C)] 97.6 F (36.4 C) (02/06 1610) Pulse Rate:  [65-84] 65 (02/06 1610) Resp:  [17-20] 18 (02/06 1610) BP: (97-149)/(49-99) 100/49 (02/06 1610) SpO2:  [97 %-100 %] 100 % (02/06 1610)  Intake/Output from previous day: 02/05 0701 - 02/06 0700 In: 2449.4 [I.V.:2049.4; IV Piggyback:400] Out: -  Intake/Output this shift: No intake/output data recorded. Nutritional status:  Diet Order             Diet NPO time specified  Diet effective now                  HEENT: [place]/AT Lungs: Respirations unlabored Ext: No edema  Neurologic Exam: Ment: Delirious. Eyes are closed at baseline but will open them to voice and light tactile stimuli. Speech is fluent with short comprehensible phrases that are non-dysarthric, but with verbalizations that do not pertain to her current environment or the questions asked. Unable to answer any orientation questions. Opens eyes and makes brief eye contact with examiner, but not following commands. Swats at examiner once during the exam. Asks for a family member twice during the exam. No family present in the room.  CN: Eyes are conjugate at the midline. Will briefly fixate and track. PERRL. Blinks to threat. Face is symmetric. Phonation intact.  Motor: Moves BUE spontaneously when swatting at examiner and scratching her scalp, without asymmetry but will not follow commands. No asymmetry noted when she gesticulates with arms while asking for a family member.  BLE are flexed at hips and knees, in fetal position. Does not follow commands to extend legs. Does move BLE to light tactile stimuli. No  asymmetry noted.  Sensory: As above. Unable to perform a detailed assessment due to confusion.  Reflexes. Non-cooperative Cerebellar: No gross upper extremity ataxia noted.  Gait: Unable to assess Other: No jerking, twitching or other seizure-like activity noted.   Lab Results: Results for orders placed or performed during the hospital encounter of 07/31/21 (from the past 48 hour(s))  Resp Panel by RT-PCR (Flu A&B, Covid) Nasopharyngeal Swab     Status: None   Collection Time: 07/31/21  9:13 PM   Specimen: Nasopharyngeal Swab; Nasopharyngeal(NP) swabs in vial transport medium  Result Value Ref Range   SARS Coronavirus 2 by RT PCR NEGATIVE NEGATIVE    Comment: (NOTE) SARS-CoV-2 target nucleic acids are NOT DETECTED.  The SARS-CoV-2 RNA is generally detectable in upper respiratory specimens during the acute phase of infection. The lowest concentration of SARS-CoV-2 viral copies this assay can detect is 138 copies/mL. A negative result does not preclude SARS-Cov-2 infection and should not be used as the sole basis for treatment or other patient management decisions. A negative result may occur with  improper specimen collection/handling, submission of specimen other than nasopharyngeal swab, presence of viral mutation(s) within the areas targeted by this assay, and inadequate number of viral copies(<138 copies/mL). A negative result must be combined with clinical observations, patient history, and epidemiological information. The expected result is Negative.  Fact Sheet for Patients:  EntrepreneurPulse.com.au  Fact Sheet for Healthcare Providers:  IncredibleEmployment.be  This test is no t yet approved or cleared by the [place] FDA and  has been authorized for detection and/or diagnosis of SARS-CoV-2 by FDA under an Emergency Use Authorization (EUA). This EUA will remain  in effect (meaning this test can be used) for the duration of  the COVID-19 declaration under Section 564(b)(1) of the Act, 21 U.S.C.section 360bbb-3(b)(1), unless the authorization is terminated  or revoked sooner.       Influenza A by PCR NEGATIVE NEGATIVE   Influenza B by PCR NEGATIVE NEGATIVE    Comment: (NOTE) The Xpert Xpress SARS-CoV-2/FLU/RSV plus assay is intended as an aid in the diagnosis of influenza from Nasopharyngeal swab specimens and should not be used as a sole basis for treatment. Nasal washings and aspirates are unacceptable for Xpert Xpress SARS-CoV-2/FLU/RSV testing.  Fact Sheet for Patients: EntrepreneurPulse.com.au  Fact Sheet for Healthcare Providers: IncredibleEmployment.be  This test is not yet approved or cleared by the [place] FDA and has been authorized for detection and/or diagnosis of SARS-CoV-2 by FDA under an Emergency Use Authorization (EUA). This EUA will remain in effect (meaning this test can be used) for the duration of the COVID-19 declaration under Section 564(b)(1) of the Act, 21 U.S.C. section 360bbb-3(b)(1), unless the authorization is terminated or revoked.  Performed at [facility], [place] [address] [place], [place] [address]   Glucose, capillary     Status: Abnormal   Collection Time: 07/31/21 10:50 PM  Result Value Ref Range   Glucose-Capillary 140 (H) 70 - 99 mg/dL    Comment: Glucose reference range applies only to samples taken after fasting for at least 8 hours.  HIV Antibody (routine testing w rflx)     Status: None   Collection Time: 07/31/21 11:56 PM  Result Value Ref Range   HIV Screen 4th Generation wRfx Non Reactive Non Reactive    Comment: Performed at [facility], [place] [address], [place], [place] [address]  TSH     Status: None   Collection Time: 07/31/21 11:56 PM  Result Value Ref Range   TSH 3.703 0.350 - 4.500 uIU/mL    Comment: Performed by a 3rd Generation assay with a functional sensitivity of <=0.01  uIU/mL. Performed at [facility], [place] [address] [place], [place] [address]   Ammonia     Status: None   Collection Time: 07/31/21 11:56 PM  Result Value Ref Range   Ammonia 32 9 - 35 umol/L    Comment: Performed at [facility], [place] [address] [place], [place] [address]  Hemoglobin A1c     Status: Abnormal   Collection Time: 07/31/21 11:56 PM  Result Value Ref Range   Hgb A1c MFr Bld 6.3 (H) 4.8 - 5.6 %    Comment: (NOTE) Pre diabetes:          5.7%-6.4%  Diabetes:              >6.4%  Glycemic control for   <7.0% adults with diabetes    Mean Plasma Glucose 134.11 mg/dL    Comment: Performed at [facility] [address], [place], [place] [address]  Basic metabolic panel     Status: Abnormal   Collection Time: 07/31/21 11:56 PM  Result Value Ref Range   Sodium 155 (H) 135 - 145 mmol/L   Potassium 3.6 3.5 - 5.1 mmol/L   Chloride 117 (H) 98 - 111  mmol/L   CO2 26 22 - 32 mmol/L   Glucose, Bld 148 (H) 70 - 99 mg/dL    Comment: Glucose reference range applies only to samples taken after fasting for at least 8 hours.   BUN 42 (H) 8 - 23 mg/dL   Creatinine, Ser 1.85 (H) 0.44 - 1.00 mg/dL   Calcium 9.3 8.9 - 10.3 mg/dL   GFR, Estimated 29 (L) >60 mL/min    Comment: (NOTE) Calculated using the CKD-EPI Creatinine Equation (2021)    Anion gap 12 5 - 15    Comment: Performed at [facility] [address], [place], [place] [address]  CBC     Status: None   Collection Time: 08/01/21  3:04 AM  Result Value Ref Range   WBC 9.8 4.0 - 10.5 K/uL   RBC 4.92 3.87 - 5.11 MIL/uL   Hemoglobin 13.9 12.0 - 15.0 g/dL   HCT 45.5 36.0 - 46.0 %   MCV 92.5 80.0 - 100.0 fL   MCH 28.3 26.0 - 34.0 pg   MCHC 30.5 30.0 - 36.0 g/dL   RDW 13.6 11.5 - 15.5 %   Platelets 376 150 - 400 K/uL   nRBC 0.0 0.0 - 0.2 %    Comment: Performed at [facility], [place] [address] [place], [place] [address]  Basic metabolic panel     Status: Abnormal   Collection Time: 08/01/21   3:04 AM  Result Value Ref Range   Sodium 157 (H) 135 - 145 mmol/L   Potassium 3.5 3.5 - 5.1 mmol/L   Chloride 119 (H) 98 - 111 mmol/L   CO2 26 22 - 32 mmol/L   Glucose, Bld 114 (H) 70 - 99 mg/dL    Comment: Glucose reference range applies only to samples taken after fasting for at least 8 hours.   BUN 39 (H) 8 - 23 mg/dL   Creatinine, Ser 1.86 (H) 0.44 - 1.00 mg/dL   Calcium 9.3 8.9 - 10.3 mg/dL   GFR, Estimated 29 (L) >60 mL/min    Comment: (NOTE) Calculated using the CKD-EPI Creatinine Equation (2021)    Anion gap 12 5 - 15    Comment: Performed at [facility] [address] [place], [place] [address]  Vitamin B12     Status: None   Collection Time: 08/01/21  3:04 AM  Result Value Ref Range   Vitamin B-12 527 180 - 914 pg/mL    Comment: (NOTE) This assay is not validated for testing neonatal or myeloproliferative syndrome specimens for Vitamin B12 levels. Performed at [facility], [place] [address] [place],  [address]   Glucose, capillary     Status: Abnormal   Collection Time: 08/01/21  3:40 AM  Result Value Ref Range   Glucose-Capillary 107 (H) 70 - 99 mg/dL    Comment: Glucose reference range applies only to samples taken after fasting for at least 8 hours.   Comment 1 Notify RN    Comment 2 Document in Chart   Basic metabolic panel     Status: Abnormal   Collection Time: 08/01/21  6:54 AM  Result Value Ref Range   Sodium 156 (H) 135 - 145 mmol/L   Potassium 3.2 (L) 3.5 - 5.1 mmol/L   Chloride 119 (H) 98 - 111 mmol/L   CO2 24 22 - 32 mmol/L   Glucose, Bld 136 (H) 70 - 99 mg/dL    Comment: Glucose reference range applies only to samples taken  after fasting for at least 8 hours.   BUN 39 (H) 8 - 23 mg/dL   Creatinine, Ser 1.73 (H) 0.44 - 1.00 mg/dL   Calcium 9.1 8.9 - 10.3 mg/dL   GFR, Estimated 32 (L) >60 mL/min    Comment: (NOTE) Calculated using the CKD-EPI Creatinine Equation (2021)    Anion gap 13 5 - 15    Comment: Performed at [facility] [address], [place], [place] [address]  Glucose, capillary     Status: Abnormal   Collection Time: 08/01/21  8:09 AM  Result Value Ref Range   Glucose-Capillary 137 (H) 70 - 99 mg/dL    Comment: Glucose reference range applies only to samples taken after fasting for at least 8 hours.  Glucose, capillary     Status: Abnormal   Collection Time: 08/01/21 12:02 PM  Result Value Ref Range   Glucose-Capillary 168 (H) 70 - 99 mg/dL    Comment: Glucose reference range applies only to samples taken after fasting for at least 8 hours.  Magnesium     Status: None   Collection Time: 08/01/21  1:05 PM  Result Value Ref Range   Magnesium 2.2 1.7 - 2.4 mg/dL    Comment: Performed at [facility], [place] [address] [place], [place] [address]  Basic metabolic panel     Status: Abnormal   Collection Time: 08/01/21  1:05 PM  Result Value Ref Range   Sodium 150 (H) 135 - 145 mmol/L   Potassium 3.8 3.5 - 5.1 mmol/L   Chloride 117 (H) 98 - 111 mmol/L   CO2 23 22 - 32 mmol/L   Glucose, Bld 187 (H) 70 - 99 mg/dL    Comment: Glucose reference range applies only to samples taken after fasting for at least 8 hours.   BUN 34 (H) 8 - 23 mg/dL   Creatinine, Ser 1.54 (H) 0.44 - 1.00 mg/dL   Calcium 8.9 8.9 - 10.3 mg/dL   GFR, Estimated 36 (L) >60 mL/min    Comment: (NOTE) Calculated using the CKD-EPI Creatinine Equation (2021)    Anion gap 10 5 - 15    Comment: Performed at [facility] [address] [place], [place] [address]  Glucose, capillary     Status: None   Collection Time: 08/01/21  4:10 PM  Result Value Ref Range   Glucose-Capillary 96 70 - 99 mg/dL    Comment: Glucose reference range applies only to samples taken after fasting for at least 8 hours.   Comment 1 Notify RN    Comment 2 Document in Chart   Glucose, capillary     Status: Abnormal   Collection Time: 08/01/21  8:07 PM  Result Value Ref Range   Glucose-Capillary 115 (H) 70 - 99 mg/dL    Comment:  Glucose reference range applies only to samples taken after fasting for at least 8 hours.  Basic metabolic panel     Status: Abnormal   Collection Time: 08/01/21  8:14 PM  Result Value Ref Range   Sodium 149 (H) 135 - 145 mmol/L   Potassium 4.3 3.5 - 5.1 mmol/L   Chloride 116 (H) 98 - 111 mmol/L   CO2 26 22 - 32 mmol/L   Glucose, Bld 133 (H) 70 - 99 mg/dL    Comment: Glucose reference range applies only to samples taken after fasting for at least 8 hours.   BUN 32 (H) 8 - 23 mg/dL   Creatinine, Ser 1.41 (  H) 0.44 - 1.00 mg/dL   Calcium 9.0 8.9 - 10.3 mg/dL   GFR, Estimated 40 (L) >60 mL/min    Comment: (NOTE) Calculated using the CKD-EPI Creatinine Equation (2021)    Anion gap 7 5 - 15    Comment: Performed at [facility], [place] [name] [place], [place] [address]  Glucose, capillary     Status: Abnormal   Collection Time: 08/02/21 12:05 AM  Result Value Ref Range   Glucose-Capillary 163 (H) 70 - 99 mg/dL    Comment: Glucose reference range applies only to samples taken after fasting for at least 8 hours.  Glucose, capillary     Status: Abnormal   Collection Time: 08/02/21  4:18 AM  Result Value Ref Range   Glucose-Capillary 128 (H) 70 - 99 mg/dL    Comment: Glucose reference range applies only to samples taken after fasting for at least 8 hours.  CBC with Differential/Platelet     Status: Abnormal   Collection Time: 08/02/21  8:09 AM  Result Value Ref Range   WBC 6.9 4.0 - 10.5 K/uL   RBC 4.34 3.87 - 5.11 MIL/uL   Hemoglobin 12.5 12.0 - 15.0 g/dL   HCT 40.1 36.0 - 46.0 %   MCV 92.4 80.0 - 100.0 fL   MCH 28.8 26.0 - 34.0 pg   MCHC 31.2 30.0 - 36.0 g/dL   RDW 13.2 11.5 - 15.5 %   Platelets 293 150 - 400 K/uL   nRBC 0.0 0.0 - 0.2 %   Neutrophils Relative % 65 %   Neutro Abs 4.5 1.7 - 7.7 K/uL   Lymphocytes Relative 12 %   Lymphs Abs 0.8 0.7 - 4.0 K/uL   Monocytes Relative 6 %   Monocytes Absolute 0.4 0.1 - 1.0 K/uL   Eosinophils Relative 16 %   Eosinophils  Absolute 1.1 (H) 0.0 - 0.5 K/uL   Basophils Relative 1 %   Basophils Absolute 0.1 0.0 - 0.1 K/uL   Immature Granulocytes 0 %   Abs Immature Granulocytes 0.02 0.00 - 0.07 K/uL    Comment: Performed at [facility], [address] [place], [place] [address]  Basic metabolic panel     Status: Abnormal   Collection Time: 08/02/21  8:09 AM  Result Value Ref Range   Sodium 145 135 - 145 mmol/L   Potassium 3.4 (L) 3.5 - 5.1 mmol/L   Chloride 109 98 - 111 mmol/L   CO2 27 22 - 32 mmol/L   Glucose, Bld 126 (H) 70 - 99 mg/dL    Comment: Glucose reference range applies only to samples taken after fasting for at least 8 hours.   BUN 25 (H) 8 - 23 mg/dL   Creatinine, Ser 1.36 (H) 0.44 - 1.00 mg/dL   Calcium 8.8 (L) 8.9 - 10.3 mg/dL   GFR, Estimated 42 (L) >60 mL/min    Comment: (NOTE) Calculated using the CKD-EPI Creatinine Equation (2021)    Anion gap 9 5 - 15    Comment: Performed at [facility] [address], [place], [place] [address]  Glucose, capillary     Status: Abnormal   Collection Time: 08/02/21  8:09 AM  Result Value Ref Range   Glucose-Capillary 127 (H) 70 - 99 mg/dL    Comment: Glucose reference range applies only to samples taken after fasting for at least 8 hours.  Glucose, capillary     Status: Abnormal   Collection Time: 08/02/21 12:36 PM  Result Value  Ref Range   Glucose-Capillary 141 (H) 70 - 99 mg/dL    Comment: Glucose reference range applies only to samples taken after fasting for at least 8 hours.  Glucose, capillary     Status: Abnormal   Collection Time: 08/02/21  3:42 PM  Result Value Ref Range   Glucose-Capillary 111 (H) 70 - 99 mg/dL    Comment: Glucose reference range applies only to samples taken after fasting for at least 8 hours.  Glucose, capillary     Status: Abnormal   Collection Time: 08/02/21  4:31 PM  Result Value Ref Range   Glucose-Capillary 115 (H) 70 - 99 mg/dL    Comment: Glucose reference range applies only to samples taken  after fasting for at least 8 hours.    Recent Results (from the past 240 hour(s))  Resp Panel by RT-PCR (Flu A&B, Covid) Nasopharyngeal Swab     Status: None   Collection Time: 07/31/21  9:13 PM   Specimen: Nasopharyngeal Swab; Nasopharyngeal(NP) swabs in vial transport medium  Result Value Ref Range Status   SARS Coronavirus 2 by RT PCR NEGATIVE NEGATIVE Final    Comment: (NOTE) SARS-CoV-2 target nucleic acids are NOT DETECTED.  The SARS-CoV-2 RNA is generally detectable in upper respiratory specimens during the acute phase of infection. The lowest concentration of SARS-CoV-2 viral copies this assay can detect is 138 copies/mL. A negative result does not preclude SARS-Cov-2 infection and should not be used as the sole basis for treatment or other patient management decisions. A negative result may occur with  improper specimen collection/handling, submission of specimen other than nasopharyngeal swab, presence of viral mutation(s) within the areas targeted by this assay, and inadequate number of viral copies(<138 copies/mL). A negative result must be combined with clinical observations, patient history, and epidemiological information. The expected result is Negative.  Fact Sheet for Patients:  EntrepreneurPulse.com.au  Fact Sheet for Healthcare Providers:  IncredibleEmployment.be  This test is no t yet approved or cleared by the [place] FDA and  has been authorized for detection and/or diagnosis of SARS-CoV-2 by FDA under an Emergency Use Authorization (EUA). This EUA will remain  in effect (meaning this test can be used) for the duration of the COVID-19 declaration under Section 564(b)(1) of the Act, 21 U.S.C.section 360bbb-3(b)(1), unless the authorization is terminated  or revoked sooner.       Influenza A by PCR NEGATIVE NEGATIVE Final   Influenza B by PCR NEGATIVE NEGATIVE Final    Comment: (NOTE) The Xpert Xpress  SARS-CoV-2/FLU/RSV plus assay is intended as an aid in the diagnosis of influenza from Nasopharyngeal swab specimens and should not be used as a sole basis for treatment. Nasal washings and aspirates are unacceptable for Xpert Xpress SARS-CoV-2/FLU/RSV testing.  Fact Sheet for Patients: EntrepreneurPulse.com.au  Fact Sheet for Healthcare Providers: IncredibleEmployment.be  This test is not yet approved or cleared by the [place] FDA and has been authorized for detection and/or diagnosis of SARS-CoV-2 by FDA under an Emergency Use Authorization (EUA). This EUA will remain in effect (meaning this test can be used) for the duration of the COVID-19 declaration under Section 564(b)(1) of the Act, 21 U.S.C. section 360bbb-3(b)(1), unless the authorization is terminated or revoked.  Performed at [facility], [place] [address] [place], [place] [address]     Lipid Panel No results for input(s): CHOL, TRIG, HDL, CHOLHDL, VLDL, LDLCALC in the last 72 hours.  Studies/Results: CT HEAD WO CONTRAST (5MM)  Result Date: 08/01/2021 CLINICAL DATA:  Delirium, altered mental status EXAM: CT HEAD WITHOUT CONTRAST TECHNIQUE: Contiguous axial images were obtained from the base of the skull through the vertex without intravenous contrast. RADIATION DOSE REDUCTION: This exam was performed according to the departmental dose-optimization program which includes automated exposure control, adjustment of the mA and/or kV according to patient size and/or use of iterative reconstruction technique. COMPARISON:  09/09/2020 FINDINGS: Brain: Ill-defined hyperdensity in the left temporal lobe (series 2, image 11 and series 5, image 44), concerning for subarachnoid hemorrhage. No evidence of acute infarction, hemorrhage, mass, mass effect, or midline shift. No hydrocephalus. Periventricular white matter changes, likely the sequela of chronic small vessel ischemic disease.  Vascular: No hyperdense vessel. Skull: Normal. Negative for fracture or focal lesion. Sinuses/Orbits: No acute finding. Other: The mastoid air cells are well aerated. IMPRESSION: IMPRESSION Hyperdensity overlying the posterior left temporal lobe, which is concerning for subarachnoid hemorrhage. An MRI is recommended for further evaluation. These results were called by telephone at the time of interpretation on 08/01/2021 at 1:53 am to provider [facility] , who verbally acknowledged these results. Electronically Signed   By: Merilyn Baba M.D.   On: 08/01/2021 01:53   MR BRAIN WO CONTRAST  Result Date: 08/01/2021 CLINICAL DATA:  71 year old female with altered mental status. Vague hyperdensity in the posterior left temporal lobe on head CT 0133 hours today possibly subarachnoid hemorrhage. EXAM: MRI HEAD WITHOUT CONTRAST TECHNIQUE: Multiplanar, multiecho pulse sequences of the brain and surrounding structures were obtained without intravenous contrast. COMPARISON:  Head CT 0133 hours. Brain MRI 05/01/2013. FINDINGS: The examination had to be discontinued prior to completion due to combative patient. Axial and coronal DWI, axial T2 and FLAIR sequences only were obtained, and are intermittently motion degraded. Brain: Generalized cerebral volume loss since 2014. No midline shift or ventriculomegaly. Normal basilar cisterns. Up to 3 cm area of heterogeneously increased T2 and FLAIR hyperintensity corresponding to the vague hyperdensity at the posterior left temporal lobe on CT this morning. And on DWI there is conspicuous increased signal hyperintensity, although seems to have both restricted and facilitated diffusion on ADC. See series 3, image 26, series 300, image 26, series 5, image 14. Cortical and subcortical white matter involvement, some of the DWI signal abnormality is gyriform. Mild if any regional mass effect (series 8, image 15). No other convincing diffusion abnormality, but there is evidence of  similar abnormal cortical T2/FLAIR hyperintensity in the contralateral posterior right temporal lobe (series 7 image 11 and series 8, image 11). But no definite additional posterior circulation cortical involvement. Nonspecific periatrial white matter hyperintensity in both hemispheres. Deep gray matter nuclei, brainstem and cerebellum grossly negative. Vascular: Major intracranial vascular flow voids are stable since 2014. Skull and upper cervical spine: Not evaluated. Sinuses/Orbits: Paranasal sinuses and mastoids are clear. Negative orbits. Other: Visible internal auditory structures appear normal. Negative visible scalp. IMPRESSION: 1. Combative patient, truncated exam. DWI, axial T2 and FLAIR only obtained and somewhat motion degraded. 2. Abnormality in the posterior left temporal lobe appears to be parenchymal and at least partially restricted on diffusion, although etiology uncertain on this limited exam. No strong evidence of hemorrhage, and at this time top differential considerations for this area include Hypercellular Tumor and Infarct. No significant mass effect. However, there is also evidence of similar contralateral posterior right temporal lobe cortical and subcortical T2 and FLAIR signal abnormality, although without corresponding diffusion change. PRES was also considered, but unlikely due to apparent sparing of other areas. Overall recommend repeat MRI Head Without  And With Contrast, with sedation if necessary to control for motion. 3. Evidence of preserved flow in the intracranial large vessels. Generalized cerebral volume loss since 2014. Electronically Signed   By: Genevie Ann M.D.   On: 08/01/2021 05:32   MR BRAIN W WO CONTRAST  Addendum Date: 08/02/2021   ADDENDUM REPORT: 08/02/2021 17:56 ADDENDUM: Findings and recommendations discussed with Dr. Horris Latino via telephone at 5:50 p.m. Electronically Signed   By: Margaretha Sheffield M.D.   On: 08/02/2021 17:56   Result Date: 08/02/2021 CLINICAL  DATA:  Mental status change, unknown cause r/o subarachnoid hemorrhage EXAM: MRI HEAD WITHOUT AND WITH CONTRAST TECHNIQUE: Multiplanar, multiecho pulse sequences of the brain and surrounding structures were obtained without and with intravenous contrast. CONTRAST:  78mL GADAVIST GADOBUTROL 1 MMOL/ML IV SOLN COMPARISON:  MRI August 01, 2021. FINDINGS: Brain: Redemonstrated area of restricted diffusion in the posterolateral left temporal lobe. No definite abnormal restricted diffusion elsewhere. This area demonstrates persistent expansile T2/FLAIR hyperintensity and some areas of susceptibility artifact in the region there suspicious for hemorrhage. There are a few punctate foci of susceptibility artifact in the more superior left parietal lobe as well as bifrontal lobes not apparent on prior and possibly representing small hemorrhages. No significant mass effect or midline shift. This area demonstrates irregular enhancement as well as overlying linear dural enhancement. Surrounding T2/FLAIR cortical and subcortical hyperintensity in the bilateral parietal and temporal lobes, which probably is increased. T2/FLAIR hyperintensity involves cortex and subcortical white matter. No hydrocephalus. Vascular: Major arterial flow voids are maintained skull base. Skull and upper cervical spine: Normal marrow signal. Sinuses/Orbits: Mild paranasal sinus mucosal thickening. Unremarkable orbits. Other: No mastoid effusions. IMPRESSION: The previously described signal abnormality in the posterior left temporal lobe demonstrates irregular enhancement with persistent mild restricted diffusion. Probably increased mildly expansile T2/FLAIR hyperintensity in bilateral parietal and temporal lobes involving both cortex and subcortical white matter with some scattered probable small areas of hemorrhage. Findings are nonspecific; however, given probable progressive involvement of BILATERAL temporal and parietal lobes, infection (including  herpes, autoimmune, or paraneoplastic encephalitis) is possible. Evolving infarct, inflammation (including PRES and vasculitis), and malignancy (including hypercellular tumor) are differential considerations. Recommend correlation with lumbar puncture and short interval follow-up. Electronically Signed: By: Margaretha Sheffield M.D. On: 08/02/2021 17:43   DG CHEST PORT 1 VIEW  Result Date: 08/01/2021 CLINICAL DATA:  Metal screening EXAM: PORTABLE CHEST 1 VIEW COMPARISON:  09/10/2020 FINDINGS: Lungs are clear. No pneumothorax or pleural effusion. Cardiac size within normal limits. Pulmonary vascularity is normal. Surgical clips are seen within the right axilla. Right humeral ORIF hardware partially visualized. No unexpected metallic foreign body noted within the visualized thorax. IMPRESSION: No unexpected metallic foreign body identified within the visualized thorax. Electronically Signed   By: Fidela Salisbury M.D.   On: 08/01/2021 03:40   DG Abd Portable 1V  Result Date: 08/01/2021 CLINICAL DATA:  Metal screening, pre MRI EXAM: PORTABLE ABDOMEN - 1 VIEW COMPARISON:  None. FINDINGS: Normal abdominal gas pattern. No gross free intraperitoneal gas. Visualized lung bases are clear. No organomegaly. Multiple rounded calcific densities seen within the right paraspinal region overlying the right fourth transverse process may represent calculi within the expected ureteropelvic junction. No unexpected metallic foreign body within the abdomen. IMPRESSION: No unexpected metallic foreign body within the visualized abdomen. Possible right urolithiasis. Electronically Signed   By: Fidela Salisbury M.D.   On: 08/01/2021 03:43    Medications: Scheduled:  insulin aspart  0-9 Units Subcutaneous Q4H   mouth  rinse  15 mL Mouth Rinse BID   Continuous:  dextrose 5 % and 0.9% NaCl 75 mL/hr at 08/02/21 1735   potassium chloride 10 mEq (08/02/21 1843)    Assessment: 1. Exam reveals a delirious patient who is uncooperative with  exam. No facial droop, gross focal weakness or sensory loss noted  2. MRI brain: Images personally reviewed. Note that the following interpretation contains additional information as compared to the official Radiology report: The previously described signal abnormality in the posterior left temporal lobe which on CT was concerning for a possible subarachnoid hemorrhage, is revealed to be more consistent with mass or inflammation. There is a focus of irregular nodular enhancement along the posterior left temporal lobe with associated persistent mild restricted diffusion, in addition to suggestion of expansile T2/FLAIR hyperintensity in the adjacent brain parenchyma involving both cortex and subcortical white matter. Also noted is abnormal FLAIR hyperintensity in the right parietal and temporal lobes, as well as left temporal and parietal lobes, that may be due to underlying lesion versus artifact. A few punctate foci of susceptibility effect in the left parietal region are most consistent with microhemorrhages. Given probable progressive involvement of BILATERAL temporal and parietal lobes, infection (including herpes, autoimmune, or paraneoplastic encephalitis) is possible. Evolving infarct, inflammation (including PRES and vasculitis), and malignancy (including hypercellular tumor) are differential considerations.     Recommendations: 1. Will need lumbar puncture for cytology (10 mL), cell count with differential, protein, glucose, bacterial culture, gram stain, fungal culture, VDRL and IgG index. Will need family to provide consent as patient is unable to make her own medical decisions at this time. If no family available for consent, then 2-physician decision for LP may be needed.  2. The patient becomes agitated with tactile stimuli, swatting at the examiner and waving her hands, as well as refusing to change position during the exam. LP will need to be performed under sedation with fluoroscopic guidance.   3. If LP is negative for infection or inflammation, then metastatic disease would be higher on the DDx and a biopsy most likely would be needed.  4. For possible abscess versus HSV encephalitis, empiric antibiotics are being started as per recommendations in UpToDate. Have discussed with Pharmacy. Empiric IV acyclovir, ceftriaxone, metronidazole and vancomycin are being started.  5. EEG in AM (ordered) 6. Pending LP, no anticoagulants. DVT prophylaxis with SCDs.  7. Consider CT of Chest/abdomen/pelvis with contrast to assess for possible primary tumor.   Addendum: Per UpToDate:  -  "Ceftriaxone covers most aerobic and microaerophilic streptococci (and can be used in place of penicillin) but also covers many Enterobacteriaceae as well, which can cause brain abscess, particularly in association with chronic ear or sinus infections or following penetrating trauma. Cefotaxime provides similar coverage." - "Metronidazole readily penetrates brain abscesses. This drug has excellent bactericidal activity against many anaerobes but is not active against aerobic organisms, including microaerophilic streptococci. Given the excellent intralesional concentrations and the high probability of anaerobes, many experts recommend administering this agent to most patients with brain abscess along with another agent." - "Vancomycin - Vancomycin should be included until culture and susceptibility results are available when brain abscess follows penetrating head trauma or craniotomy or when S. aureus bacteremia is documented. Vancomycin should also be included until culture and susceptibility results are available in patients with hospital-associated infections and in patients in communities where community-associated, methicillin-resistant S. aureus (MRSA) is common."     LOS: 2 days   @Electronically  signed: Dr. Kerney Elbe 08/02/2021  8:26 PM

## 2021-08-03 ENCOUNTER — Inpatient Hospital Stay (HOSPITAL_COMMUNITY): Payer: Medicare PPO

## 2021-08-03 ENCOUNTER — Encounter (HOSPITAL_COMMUNITY): Payer: Self-pay | Admitting: Radiology

## 2021-08-03 LAB — PROTEIN, CSF: Total  Protein, CSF: 53 mg/dL — ABNORMAL HIGH (ref 15–45)

## 2021-08-03 LAB — BASIC METABOLIC PANEL
Anion gap: 9 (ref 5–15)
BUN: 20 mg/dL (ref 8–23)
CO2: 23 mmol/L (ref 22–32)
Calcium: 8.4 mg/dL — ABNORMAL LOW (ref 8.9–10.3)
Chloride: 110 mmol/L (ref 98–111)
Creatinine, Ser: 1.23 mg/dL — ABNORMAL HIGH (ref 0.44–1.00)
GFR, Estimated: 48 mL/min — ABNORMAL LOW (ref >60)
Glucose, Bld: 78 mg/dL (ref 70–99)
Potassium: 3.7 mmol/L (ref 3.5–5.1)
Sodium: 142 mmol/L (ref 135–145)

## 2021-08-03 LAB — GLUCOSE, CAPILLARY
Glucose-Capillary: 110 mg/dL — ABNORMAL HIGH (ref 70–99)
Glucose-Capillary: 133 mg/dL — ABNORMAL HIGH (ref 70–99)
Glucose-Capillary: 156 mg/dL — ABNORMAL HIGH (ref 70–99)
Glucose-Capillary: 165 mg/dL — ABNORMAL HIGH (ref 70–99)
Glucose-Capillary: 50 mg/dL — ABNORMAL LOW (ref 70–99)
Glucose-Capillary: 68 mg/dL — ABNORMAL LOW (ref 70–99)
Glucose-Capillary: 80 mg/dL (ref 70–99)
Glucose-Capillary: 96 mg/dL (ref 70–99)

## 2021-08-03 LAB — CBC WITH DIFFERENTIAL/PLATELET
Abs Immature Granulocytes: 0.02 10*3/uL (ref 0.00–0.07)
Basophils Absolute: 0.1 10*3/uL (ref 0.0–0.1)
Basophils Relative: 1 %
Eosinophils Absolute: 0.9 10*3/uL — ABNORMAL HIGH (ref 0.0–0.5)
Eosinophils Relative: 14 %
HCT: 36.3 % (ref 36.0–46.0)
Hemoglobin: 11.6 g/dL — ABNORMAL LOW (ref 12.0–15.0)
Immature Granulocytes: 0 %
Lymphocytes Relative: 11 %
Lymphs Abs: 0.8 10*3/uL (ref 0.7–4.0)
MCH: 28.8 pg (ref 26.0–34.0)
MCHC: 32 g/dL (ref 30.0–36.0)
MCV: 90.1 fL (ref 80.0–100.0)
Monocytes Absolute: 0.4 10*3/uL (ref 0.1–1.0)
Monocytes Relative: 6 %
Neutro Abs: 4.7 10*3/uL (ref 1.7–7.7)
Neutrophils Relative %: 68 %
Platelets: 281 10*3/uL (ref 150–400)
RBC: 4.03 MIL/uL (ref 3.87–5.11)
RDW: 13.2 % (ref 11.5–15.5)
WBC: 6.8 10*3/uL (ref 4.0–10.5)
nRBC: 0 % (ref 0.0–0.2)

## 2021-08-03 LAB — GLUCOSE, CSF: Glucose, CSF: 77 mg/dL — ABNORMAL HIGH (ref 40–70)

## 2021-08-03 LAB — CSF CELL COUNT WITH DIFFERENTIAL
RBC Count, CSF: 4 /mm3 — ABNORMAL HIGH
Tube #: 1
WBC, CSF: 1 /mm3 (ref 0–5)

## 2021-08-03 MED ORDER — LIDOCAINE HCL (PF) 1 % IJ SOLN
5.0000 mL | Freq: Once | INTRAMUSCULAR | Status: AC
  Administered 2021-08-03: 2 mL

## 2021-08-03 NOTE — Procedures (Signed)
PROCEDURE SUMMARY:  Successful Lumbar Puncture from L3-L4.  Yielded 7cc of clear fluid.  Patient movement during procedure dislodged needle tip, presumably into venous plexus.  Needle was removed and procedure terminated. No immediate complications.   Specimen was sent for labs.  EBL negligible  Kijuana Ruppel PA-C 08/03/2021 3:45 PM

## 2021-08-03 NOTE — Progress Notes (Addendum)
PROGRESS NOTE  CHASTIDY RANKER YSA:[id number] DOB: 05/04/1951 DOA: 07/31/2021 PCP: [name], MD  HPI/Recap of past 24 hours: Marie [name] is a 71 y.o. female with medical history significant of dementia with behavioral disturbance, depression, DM type 2 not on medications, HLD, HTN, hypothyroidism, mild nonobstructive CAD per cath done in May 2021, CKD stage IIIa, history of breast cancer presented to the ED via EMS and law enforcement under IVC. Patient normally lives with her husband at home who manages her ADLs. Husband was admitted to the hospital for the last 4 days and she was alone at home. Per [employer], family is unreliable and has mental health issues of their own. Husband was discharged from the hospital today and granddaughter came home before he arrived to find the patient aggravated, combative, eating her own feces and covered in feces, roaches, lice, and bedbugs.  Granddaughter has taken out IVC papers.  Vital signs stable on arrival to the ED.  Patient was combative and was given Ativan and Haldol.  Labs fairly stable except for sodium 156, BUN 41, creatinine 2.0 (baseline 1.0-1.3), UA without signs of infection. Ethanol and UDS negative. CT head concerning for subarachnoid hemorrhage. Neurosurgery consulted and requested MRI and LP. MRI attempted after patient received Haldol, but patient still combative and noted motion degraded, recommending repeat MRI with sedation for further evaluation. Neurology attempted to contact patient for consent for LP, with no luck. Patient admitted for further management.     Pt seen today, continues to be confused, unable to engage in any meaningful conversation, but awake alert, not oriented    Assessment/Plan: Principal Problem:   Acute metabolic encephalopathy Active Problems:   Type 2 diabetes mellitus with stage 3a chronic kidney disease, without long-term current use of insulin (HCC)   Acute renal failure superimposed on stage 3a  chronic kidney disease (HCC)   Hypernatremia   Social problem   Acute metabolic encephalopathy- (present on admission) Dementia with behavioral disturbance Likely due to metabolic derangements (dehydration/hypernatremia/AKI) with underlying dementia Vs encephalitis, vasculitis, malignancy Currently afebrile, with no leukocytosis UA without signs of infection MRI brain showed possible encephalitis versus vasculitis rule out malignancy Neurology consulted, recommend LP, status IV acyclovir (has to be on IV fluids due to renal toxicity), ceftriaxone, Flagyl, vancomycin LP done on 08/03/2021 yielded 7 cc of clear fluid, specimen sent for labs, pending CT chest/abdomen/pelvis to rule out any occult malignancy TSH, Vit B12, ammonia all WNL Ethanol and UDS negative CXR unremarkable Monitor closely, delirium precautions   Hypernatremia- (present on admission) Improved Likely due to severe dehydration, sodium 156 on presentation S/p D5 IV fluids, now on normal saline Daily BMP  Possible subarachnoid hemorrhage seen on CT CT head showed above Neurosurgery consulted, unlikely Repeat MRI as above   AKI on stage 3a chronic kidney disease Improved On admission, BUN 41, creatinine 2.0 (baseline 1.0-1.3). Daily BMP  Hypokalemia Replace as needed Daily BMP  Type 2 DM, without long-term current use of insulin ([name])- (present on admission) A1c 6.3 Not on any medications at home SSI every 4 hours, Accu-Cheks, hypoglycemic protocol   Social problem Per report from ED staff, patient's primary caregiver is her husband who was hospitalized for the past few days.  Her daughter is also currently hospitalized and admitted to the ICU. Patient was at home with her grand children who reportedly have substance abuse issues. Another granddaughter, checked on patient, noted to be aggravated, combative, eating her own feces and covered in feces, roaches, lice,  and bedbugs.  IVC paperwork was done per  request from granddaughter PT/OT and social work consulted for placement       Estimated body mass index is 18.67 kg/m as calculated from the following:   Height as of this encounter: 5' 4.5" (1.638 m).   Weight as of this encounter: 50.1 kg.     Code Status: Full  Family Communication: Discussed with grand-daughter over the phone  Disposition Plan: Status is: Inpatient Remains inpatient appropriate because: Level of care, will need SNF    Consultants: Neurosurgery Neurology  Procedures: Sedation by anesthesia for MRI  Antimicrobials: IV acyclovir Ceftriaxone Flagyl Vancomycin  DVT prophylaxis:  SCDs   Objective: Vitals:   08/03/21 0353 08/03/21 0726 08/03/21 1153 08/03/21 1625  BP: 118/62 108/78 (!) 124/55 111/66  Pulse: 82 93 83 75  Resp: 18 16 16 18   Temp: 98.6 F (37 C) 97.7 F (36.5 C) 97.7 F (36.5 C) 97.8 F (36.6 C)  TempSrc: Axillary Axillary Axillary Axillary  SpO2: 98%     Weight:      Height:        Intake/Output Summary (Last 24 hours) at 08/03/2021 1849 Last data filed at 08/03/2021 1546 Gross per 24 hour  Intake 3434.02 ml  Output --  Net 3434.02 ml   Filed Weights   07/31/21 1734 07/31/21 2300  Weight: 59 kg 50.1 kg    Exam: General: NAD, awake, alert, unable to engage in any meaningful conversation, not oriented Cardiovascular: S1, S2 present Respiratory: CTAB Abdomen: Soft, nontender, nondistended, bowel sounds present Musculoskeletal: No bilateral pedal edema noted Skin: Normal Psychiatry: Unable to assess Neurology: Unable to perform as not following commands     Data Reviewed: CBC: Recent Labs  Lab 07/31/21 1941 08/01/21 0304 08/02/21 0809 08/03/21 0238  WBC 9.1 9.8 6.9 6.8  NEUTROABS 7.2  --  4.5 4.7  HGB 15.8* 13.9 12.5 11.6*  HCT 51.1* 45.5 40.1 36.3  MCV 93.2 92.5 92.4 90.1  PLT 412* 376 293 338   Basic Metabolic Panel: Recent Labs  Lab 08/01/21 0654 08/01/21 1305 08/01/21 2014 08/02/21 0809  08/03/21 0238  NA 156* 150* 149* 145 142  K 3.2* 3.8 4.3 3.4* 3.7  CL 119* 117* 116* 109 110  CO2 24 23 26 27 23   GLUCOSE 136* 187* 133* 126* 78  BUN 39* 34* 32* 25* 20  CREATININE 1.73* 1.54* 1.41* 1.36* 1.23*  CALCIUM 9.1 8.9 9.0 8.8* 8.4*  MG  --  2.2  --   --   --    GFR: Estimated Creatinine Clearance: 34.1 mL/min (A) (by C-G formula based on SCr of 1.23 mg/dL (H)). Liver Function Tests: Recent Labs  Lab 07/31/21 1941  AST 33  ALT 17  ALKPHOS 75  BILITOT 0.6  PROT 7.8  ALBUMIN 3.6   No results for input(s): LIPASE, AMYLASE in the last 168 hours. Recent Labs  Lab 07/31/21 2356  AMMONIA 32   Coagulation Profile: No results for input(s): INR, PROTIME in the last 168 hours. Cardiac Enzymes: No results for input(s): CKTOTAL, CKMB, CKMBINDEX, TROPONINI in the last 168 hours. BNP (last 3 results) No results for input(s): PROBNP in the last 8760 hours. HbA1C: Recent Labs    07/31/21 2356  HGBA1C 6.3*   CBG: Recent Labs  Lab 08/03/21 0401 08/03/21 0407 08/03/21 0754 08/03/21 1150 08/03/21 1624  GLUCAP 68* 80 133* 96 156*   Lipid Profile: No results for input(s): CHOL, HDL, LDLCALC, TRIG, CHOLHDL, LDLDIRECT in the last 72  hours. Thyroid Function Tests: Recent Labs    07/31/21 2356  TSH 3.703   Anemia Panel: Recent Labs    08/01/21 0304  VITAMINB12 527   Urine analysis:    Component Value Date/Time   COLORURINE YELLOW 07/31/2021 1941   APPEARANCEUR CLEAR 07/31/2021 1941   LABSPEC >1.030 (H) 07/31/2021 1941   PHURINE 5.5 07/31/2021 1941   GLUCOSEU NEGATIVE 07/31/2021 1941   HGBUR NEGATIVE 07/31/2021 1941   BILIRUBINUR MODERATE (A) 07/31/2021 1941   KETONESUR NEGATIVE 07/31/2021 1941   PROTEINUR NEGATIVE 07/31/2021 1941   UROBILINOGEN 0.2 07/16/2014 1212   NITRITE NEGATIVE 07/31/2021 1941   LEUKOCYTESUR NEGATIVE 07/31/2021 1941   Sepsis Labs: @LABRCNTIP (procalcitonin:4,lacticidven:4)  ) Recent Results (from the past 240 hour(s))  Resp  Panel by RT-PCR (Flu A&B, Covid) Nasopharyngeal Swab     Status: None   Collection Time: 07/31/21  9:13 PM   Specimen: Nasopharyngeal Swab; Nasopharyngeal(NP) swabs in vial transport medium  Result Value Ref Range Status   SARS Coronavirus 2 by RT PCR NEGATIVE NEGATIVE Final    Comment: (NOTE) SARS-CoV-2 target nucleic acids are NOT DETECTED.  The SARS-CoV-2 RNA is generally detectable in upper respiratory specimens during the acute phase of infection. The lowest concentration of SARS-CoV-2 viral copies this assay can detect is 138 copies/mL. A negative result does not preclude SARS-Cov-2 infection and should not be used as the sole basis for treatment or other patient management decisions. A negative result may occur with  improper specimen collection/handling, submission of specimen other than nasopharyngeal swab, presence of viral mutation(s) within the areas targeted by this assay, and inadequate number of viral copies(<138 copies/mL). A negative result must be combined with clinical observations, patient history, and epidemiological information. The expected result is Negative.  Fact Sheet for Patients:  EntrepreneurPulse.com.au  Fact Sheet for Healthcare Providers:  IncredibleEmployment.be  This test is no t yet approved or cleared by the [place] FDA and  has been authorized for detection and/or diagnosis of SARS-CoV-2 by FDA under an Emergency Use Authorization (EUA). This EUA will remain  in effect (meaning this test can be used) for the duration of the COVID-19 declaration under Section 564(b)(1) of the Act, 21 U.S.C.section 360bbb-3(b)(1), unless the authorization is terminated  or revoked sooner.       Influenza A by PCR NEGATIVE NEGATIVE Final   Influenza B by PCR NEGATIVE NEGATIVE Final    Comment: (NOTE) The Xpert Xpress SARS-CoV-2/FLU/RSV plus assay is intended as an aid in the diagnosis of influenza from Nasopharyngeal  swab specimens and should not be used as a sole basis for treatment. Nasal washings and aspirates are unacceptable for Xpert Xpress SARS-CoV-2/FLU/RSV testing.  Fact Sheet for Patients: EntrepreneurPulse.com.au  Fact Sheet for Healthcare Providers: IncredibleEmployment.be  This test is not yet approved or cleared by the [place] FDA and has been authorized for detection and/or diagnosis of SARS-CoV-2 by FDA under an Emergency Use Authorization (EUA). This EUA will remain in effect (meaning this test can be used) for the duration of the COVID-19 declaration under Section 564(b)(1) of the Act, 21 U.S.C. section 360bbb-3(b)(1), unless the authorization is terminated or revoked.  Performed at [facility], [place] [address], [place], [place] [address]       Studies: DG FLUORO GUIDE LUMBAR PUNCTURE  Result Date: 08/03/2021 CLINICAL DATA:  Acute metabolic encephalopathy EXAM: DIAGNOSTIC LUMBAR PUNCTURE UNDER FLUOROSCOPIC GUIDANCE COMPARISON:  None FLUOROSCOPY: Fluoroscopy Time: 12 seconds Radiation Exposure Index (if provided by the fluoroscopic device): 1.4 mGy  Number of Acquired Spot Images: 0 PROCEDURE: Informed consent was obtained from the patient's son, [name], prior to the procedure, including potential complications of headache, allergy, and pain. With the patient prone, the lower back was prepped with Betadine. 1% Lidocaine was used for local anesthesia. Lumbar puncture was performed at the L3-L4 level using a 20 gauge needle with return of clear CSF. Opening pressure was deferred due to slow/low flow and patient cooperation limitations. 13ml of CSF were obtained for laboratory studies. The patient struggled to tolerate the procedure well and ultimately dislodged placement of needle with arching of her back. The needle was removed and procedure terminated. No apparent complications. IMPRESSION: Technically successful lumbar puncture with limited  fluid retrieval as described above. Approximately 7 mL of clear CSF was collected and sent to the laboratory for analysis. Read and performed by: Alexandria Lodge, PA-C Electronically Signed   By: Maurine Simmering M.D.   On: 08/03/2021 16:32    Scheduled Meds:  mouth rinse  15 mL Mouth Rinse BID    Continuous Infusions:  sodium chloride 125 mL/hr at 08/03/21 1048   acyclovir (ZOVIRAX) </= 700 mg IVPB 500 mg (08/03/21 1314)   cefTRIAXone (ROCEPHIN)  IV 2 g (08/03/21 1050)   metronidazole 500 mg (08/03/21 1801)   vancomycin       LOS: 3 days     Alma Friendly, MD [facility]  If 7PM-7AM, please contact night-coverage [email or web address] 08/03/2021, 6:49 PM

## 2021-08-04 ENCOUNTER — Inpatient Hospital Stay (HOSPITAL_COMMUNITY): Payer: Medicare PPO

## 2021-08-04 DIAGNOSIS — Z515 Encounter for palliative care: Secondary | ICD-10-CM

## 2021-08-04 LAB — BASIC METABOLIC PANEL
Anion gap: 11 (ref 5–15)
BUN: 10 mg/dL (ref 8–23)
CO2: 21 mmol/L — ABNORMAL LOW (ref 22–32)
Calcium: 8.2 mg/dL — ABNORMAL LOW (ref 8.9–10.3)
Chloride: 108 mmol/L (ref 98–111)
Creatinine, Ser: 1.02 mg/dL — ABNORMAL HIGH (ref 0.44–1.00)
GFR, Estimated: 60 mL/min — ABNORMAL LOW (ref >60)
Glucose, Bld: 121 mg/dL — ABNORMAL HIGH (ref 70–99)
Potassium: 4 mmol/L (ref 3.5–5.1)
Sodium: 140 mmol/L (ref 135–145)

## 2021-08-04 LAB — CBC WITH DIFFERENTIAL/PLATELET
Abs Immature Granulocytes: 0.02 10*3/uL (ref 0.00–0.07)
Basophils Absolute: 0 10*3/uL (ref 0.0–0.1)
Basophils Relative: 1 %
Eosinophils Absolute: 0.8 10*3/uL — ABNORMAL HIGH (ref 0.0–0.5)
Eosinophils Relative: 12 %
HCT: 36.5 % (ref 36.0–46.0)
Hemoglobin: 11.7 g/dL — ABNORMAL LOW (ref 12.0–15.0)
Immature Granulocytes: 0 %
Lymphocytes Relative: 8 %
Lymphs Abs: 0.5 10*3/uL — ABNORMAL LOW (ref 0.7–4.0)
MCH: 28.3 pg (ref 26.0–34.0)
MCHC: 32.1 g/dL (ref 30.0–36.0)
MCV: 88.2 fL (ref 80.0–100.0)
Monocytes Absolute: 0.4 10*3/uL (ref 0.1–1.0)
Monocytes Relative: 6 %
Neutro Abs: 5 10*3/uL (ref 1.7–7.7)
Neutrophils Relative %: 73 %
Platelets: 263 10*3/uL (ref 150–400)
RBC: 4.14 MIL/uL (ref 3.87–5.11)
RDW: 13.2 % (ref 11.5–15.5)
WBC: 6.8 10*3/uL (ref 4.0–10.5)
nRBC: 0 % (ref 0.0–0.2)

## 2021-08-04 LAB — GLUCOSE, CAPILLARY
Glucose-Capillary: 110 mg/dL — ABNORMAL HIGH (ref 70–99)
Glucose-Capillary: 133 mg/dL — ABNORMAL HIGH (ref 70–99)
Glucose-Capillary: 165 mg/dL — ABNORMAL HIGH (ref 70–99)
Glucose-Capillary: 197 mg/dL — ABNORMAL HIGH (ref 70–99)

## 2021-08-04 MED ORDER — IOHEXOL 300 MG/ML  SOLN
100.0000 mL | Freq: Once | INTRAMUSCULAR | Status: AC | PRN
  Administered 2021-08-04: 100 mL via INTRAVENOUS

## 2021-08-04 MED ORDER — PERMETHRIN 1 % EX LOTN
TOPICAL_LOTION | Freq: Once | CUTANEOUS | Status: AC
  Administered 2021-08-04: 1 via TOPICAL
  Filled 2021-08-04: qty 59

## 2021-08-04 NOTE — Evaluation (Signed)
Clinical/Bedside Swallow Evaluation Patient Details  Name: Marie [name] MRN: [id number] Date of Birth: 1950/10/04  Today's Date: 08/04/2021 Time: SLP Start Time (ACUTE ONLY): 50 SLP Stop Time (ACUTE ONLY): 9622 SLP Time Calculation (min) (ACUTE ONLY): 11 min  Past Medical History:  Past Medical History:  Diagnosis Date   Dementia ([place])    Depression    Diabetes mellitus    Hypertension    Thyroid disease    Type II or unspecified type diabetes mellitus without mention of complication, not stated as uncontrolled    stopped metformin due to diarrhea, no meds now   Vitamin D deficiency    Past Surgical History:  Past Surgical History:  Procedure Laterality Date   BREAST LUMPECTOMY WITH RADIOACTIVE SEED AND SENTINEL LYMPH NODE BIOPSY Right 03/08/2018   Procedure: BREAST LUMPECTOMY WITH RADIOACTIVE SEED AND SENTINEL LYMPH NODE BIOPSY;  Surgeon: [name], MD;  Location: [facility];  Service: General;  Laterality: Right;   CARDIAC CATHETERIZATION  11/05/2019   FRACTURE SURGERY  2009   right arm; rod   LEFT HEART CATH AND CORONARY ANGIOGRAPHY N/A 11/05/2019   Procedure: LEFT HEART CATH AND CORONARY ANGIOGRAPHY;  Surgeon: [name], MD;  Location: [facility];  Service: Cardiovascular;  Laterality: N/A;   RADIOLOGY WITH ANESTHESIA N/A 08/02/2021   Procedure: MRI WITH ANESTHESIA;  Surgeon: Radiologist, Medication, MD;  Location: [name];  Service: Radiology;  Laterality: N/A;   TOTAL KNEE ARTHROPLASTY  june 2013   left   HPI:  Marie [name] is a 71 y.o. female who presented to the ED found by granddaughter covered in bed bugs, lice, roaches, and feces in which pt was eating it. Per chart pt lives with spouse however spouse was admitted to the hospital for the last 4 days and she was home alone. Diagnosed with acute metabolic encephalopathy. PMH: dementia with behavioral disturbance, depression, type II diabetes not on medications, hyperlipidemia,  hypertension, hypothyroidism, mild nonobstructive CAD per cath done in May 2021, vitamin D deficiency, aortic atherosclerosis, CKD stage IIIa, history of breast cancer    Assessment / Plan / Recommendation  Clinical Impression  Pt presents with normal oropharyngeal swallow with lunch meal and thin liquids. She has normal strength allowing her to masticate, manipulate and transfer boluses for normal appearing swallow. No concern for aspiration and recommend she continue with regular texture, thin liquids, she needs intermittent assist for meal set up and consumption due to dementia. No further ST needed. SLP Visit Diagnosis: Dysphagia, unspecified (R13.10)    Aspiration Risk  Mild aspiration risk    Diet Recommendation Regular;Thin liquid   Liquid Administration via: Straw;Cup Medication Administration: Whole meds with liquid Supervision: Patient able to self feed;Intermittent supervision to cue for compensatory strategies Compensations: Minimize environmental distractions Postural Changes: Seated upright at 90 degrees    Other  Recommendations Oral Care Recommendations: Oral care BID    Recommendations for follow up therapy are one component of a multi-disciplinary discharge planning process, led by the attending physician.  Recommendations may be updated based on patient status, additional functional criteria and insurance authorization.  Follow up Recommendations No SLP follow up      Assistance Recommended at Discharge    Functional Status Assessment    Frequency and Duration            Prognosis        Swallow Study   General Date of Onset: 07/31/21 HPI: Marie [name] is a 71 y.o.  female who presented to the ED found by granddaughter covered in bed bugs, lice, roaches, and feces in which pt was eating it. Per chart pt lives with spouse however spouse was admitted to the hospital for the last 4 days and she was home alone. Diagnosed with acute metabolic encephalopathy.  PMH: dementia with behavioral disturbance, depression, type II diabetes not on medications, hyperlipidemia, hypertension, hypothyroidism, mild nonobstructive CAD per cath done in May 2021, vitamin D deficiency, aortic atherosclerosis, CKD stage IIIa, history of breast cancer Type of Study: Bedside Swallow Evaluation Diet Prior to this Study: Regular;Thin liquids Temperature Spikes Noted: No Respiratory Status: Room air History of Recent Intubation: No Behavior/Cognition: Alert;Confused;Pleasant mood;Cooperative;Requires cueing Oral Cavity Assessment: Within Functional Limits Oral Care Completed by SLP: No Oral Cavity - Dentition: Adequate natural dentition (missing only several posterior) Vision: Functional for self-feeding Self-Feeding Abilities: Able to feed self;Needs set up Patient Positioning: Upright in bed Baseline Vocal Quality: Normal    Oral/Motor/Sensory Function Overall Oral Motor/Sensory Function: Within functional limits   Ice Chips Ice chips: Not tested   Thin Liquid Thin Liquid: Within functional limits Presentation: Straw    Nectar Thick Nectar Thick Liquid: Not tested   Honey Thick Honey Thick Liquid: Not tested   Puree Puree: Not tested   Solid     Solid: Within functional limits      Houston Siren 08/04/2021,2:11 PM   Orbie Pyo [place].Ed Risk analyst [phone] Office [phone]

## 2021-08-04 NOTE — Progress Notes (Signed)
Physical Therapy Treatment Patient Details Name: Marie [name] MRN: [id number] DOB: 1950-11-09 Today's Date: 08/04/2021   History of Present Illness Marie [name] is a 71 y.o. female who presented to the ED via EMS and law enforcement under IVC.  Pt was found by granddaughter covered in bed bugs, lice, roaches, and feces in which pt was eating it. Pt lives with spouse however spouse was admitted to the hospital for the last 4 days and she was home alone. [id number] with behavioral disturbance, depression, type II diabetes not on medications, hyperlipidemia, hypertension, hypothyroidism, mild nonobstructive CAD per cath done in May 2021, vitamin D deficiency, aortic atherosclerosis, CKD stage IIIa, history of breast cancer    PT Comments    Pt awake and alert, unable to answer questions appropriately. Pt mobility is severely impacted by inability to follow verbal commands. With maximal tactile cuing pt is able to get OOB to walk to sink with min Ax2 for safety.Pt requiring total A for returning LE to bed to lay back down at the end of the session.  Pt no familiarity with toothbrush or washcloth to wash face. Pt will benefit from post acute rehab to improve endurance.   Recommendations for follow up therapy are one component of a multi-disciplinary discharge planning process, led by the attending physician.  Recommendations may be updated based on patient status, additional functional criteria and insurance authorization.  Follow Up Recommendations  Skilled nursing-short term rehab (<3 hours/day) (with transition to memory care)     Assistance Recommended at Discharge Frequent or constant Supervision/Assistance  Patient can return home with the following A lot of help with walking and/or transfers;A lot of help with bathing/dressing/bathroom;Assistance with cooking/housework;Direct supervision/assist for medications management;Direct supervision/assist for financial management;Assist for  transportation;Help with stairs or ramp for entrance   Equipment Recommendations   (TBD at next venue)       Precautions / Restrictions Precautions Precautions: Fall;Other (comment) Precaution Comments: pt with observed bed bugs and lice investing pt's hair and body Restrictions Weight Bearing Restrictions: No     Mobility  Bed Mobility Overal bed mobility: Needs Assistance Bed Mobility: Supine to Sit, Sit to Supine, Rolling (pt received in R sidelying) Rolling: Supervision   Supine to sit: +2 for physical assistance, Mod assist Sit to supine: Total assist   General bed mobility comments: max verbal and tactile cues for rolling to back and coming to seated on L side of bed despite maximal cuing requires maxAx2 for pad scoot to EoB to get feet on floor    Transfers Overall transfer level: Needs assistance   Transfers: Sit to/from Stand Sit to Stand: Min assist           General transfer comment: pt requires min A in form of hand over hand tactile cues to come to standing,    Ambulation/Gait Ambulation/Gait assistance: Min assist, +2 safety/equipment Gait Distance (Feet): 16 Feet Assistive device: 1 person hand held assist Gait Pattern/deviations: Step-through pattern, Shuffle Gait velocity: slowed Gait velocity interpretation: <1.31 ft/sec, indicative of household ambulator   General Gait Details: min A for leading pt from bed to sink to wash hands, pt with slowed shuffling gait      Modified Rankin (Stroke Patients Only) Modified Rankin (Stroke Patients Only) Pre-Morbid Rankin Score: Severe disability Modified Rankin: Severe disability     Balance Overall balance assessment: Needs assistance Sitting-balance support: Feet supported, No upper extremity supported Sitting balance-Leahy Scale: Fair Sitting balance - Comments: pt able to maintain EOB balance  with close min guard with no UE support   Standing balance support: No upper extremity supported,  During functional activity Standing balance-Leahy Scale: Fair Standing balance comment: able to stand unsupported for dynamic tasks                            Cognition Arousal/Alertness: Awake/alert Behavior During Therapy: Flat affect Overall Cognitive Status: History of cognitive impairments - at baseline                                 General Comments: pt pleasantly confused, does not follow any verbal commands needs tactile cuing for all activities, agreeable to mobilization           General Comments General comments (skin integrity, edema, etc.): pt continues to receive treatment for lice and bed bugs.      Pertinent Vitals/Pain Pain Assessment Pain Assessment: Faces Faces Pain Scale: No hurt     PT Goals (current goals can now be found in the care plan section) Acute Rehab PT Goals Patient Stated Goal: didn't state PT Goal Formulation: Patient unable to participate in goal setting Time For Goal Achievement: 08/15/21 Potential to Achieve Goals: Fair Progress towards PT goals: Progressing toward goals    Frequency    Min 2X/week      PT Plan Current plan remains appropriate    Co-evaluation PT/OT/SLP Co-Evaluation/Treatment: Yes Reason for Co-Treatment: Complexity of the patient's impairments (multi-system involvement);Necessary to address cognition/behavior during functional activity PT goals addressed during session: Mobility/safety with mobility        AM-PAC PT "6 Clicks" Mobility   Outcome Measure  Help needed turning from your back to your side while in a flat bed without using bedrails?: A Lot Help needed moving from lying on your back to sitting on the side of a flat bed without using bedrails?: A Lot Help needed moving to and from a bed to a chair (including a wheelchair)?: A Lot Help needed standing up from a chair using your arms (e.g., wheelchair or bedside chair)?: Total Help needed to walk in hospital room?:  Total Help needed climbing 3-5 steps with a railing? : Total 6 Click Score: 9    End of Session   Activity Tolerance: Patient tolerated treatment well Patient left: in bed;with call bell/phone within reach;with bed alarm set;with nursing/sitter in room Nurse Communication: Mobility status PT Visit Diagnosis: Unsteadiness on feet (R26.81);Muscle weakness (generalized) (M62.81);Difficulty in walking, not elsewhere classified (R26.2);Adult, failure to thrive (R62.7)     Time: 1448-1856 PT Time Calculation (min) (ACUTE ONLY): 23 min  Charges:  $Therapeutic Activity: 8-22 mins                     Marie Ryder B. Migdalia Dk PT, DPT Acute Rehabilitation Services Pager [phone] Office [phone]    [place] 08/04/2021, 4:22 PM

## 2021-08-04 NOTE — Progress Notes (Signed)
° ° ° °  Referral received for Marie [name] for goals of care discussion. Chart reviewed and updates received from RN. Patient assessed and is unable to engage appropriately in discussions. She did deny pain or acute issues today.  I was able to speak with patient's husband Marie [name], although difficulty in understanding him over the [facility] with reception issues which precluded in-depth [place] discussion virtually. He states he was just d/c from the hospital Sunday night and is doing ok, but still sore and weak. He was going to attempt to come to the hospital Friday. We discussed the importance of sitting down together to have a GOC discussion/family meeting to determine where to go from here. He admits he is having a lot of difficulty caring for the patient himself. [place] meeting scheduled for Friday 2/10 @ 2:00 pm. Family is aware we will meet at patient's bedside.   Detailed note and recommendations to follow once GOC/Family meeting has been completed.   Thank you for your referral and allowing PMT to assist in [place] care.   Walden Field, NP Palliative Medicine Team Phone: [phone]  NO CHARGE

## 2021-08-04 NOTE — Progress Notes (Signed)
Occupational Therapy Treatment Patient Details Name: Marie [name] MRN: [id number] DOB: April 15, 1951 Today's Date: 08/04/2021   History of present illness Marie [name] is a 70 y.o. female who presented to the ED via EMS and law enforcement under IVC.  Pt was found by granddaughter covered in bed bugs, lice, roaches, and feces in which pt was eating it. Pt lives with spouse however spouse was admitted to the hospital for the last 4 days and she was home alone. [id number] with behavioral disturbance, depression, type II diabetes not on medications, hyperlipidemia, hypertension, hypothyroidism, mild nonobstructive CAD per cath done in May 2021, vitamin D deficiency, aortic atherosclerosis, CKD stage IIIa, history of breast cancer   OT comments  Pt making incremental progress with physical OT goals. This session pt continues to only follow <25% of commands and is hallucinating. When asked what an item was (tooth brush), pt was unable to name the item. Mobilizing with min A and 2 person hand held assist. Continue to recommend SNF level therapies, OT will follow acutely.    Recommendations for follow up therapy are one component of a multi-disciplinary discharge planning process, led by the attending physician.  Recommendations may be updated based on patient status, additional functional criteria and insurance authorization.    Follow Up Recommendations  Skilled nursing-short term rehab (<3 hours/day)    Assistance Recommended at Discharge Frequent or constant Supervision/Assistance  Patient can return home with the following  A lot of help with walking and/or transfers;A lot of help with bathing/dressing/bathroom;Assistance with cooking/housework;Direct supervision/assist for medications management;Direct supervision/assist for financial management;Assistance with feeding   Equipment Recommendations  Other (comment)    Recommendations for Other Services      Precautions / Restrictions  Precautions Precautions: Fall;Other (comment) Precaution Comments: pt with observed bed bugs and lice infesting pt's hair and body Restrictions Weight Bearing Restrictions: No       Mobility Bed Mobility Overal bed mobility: Needs Assistance Bed Mobility: Supine to Sit, Sit to Supine     Supine to sit: Max assist Sit to supine: Max assist   General bed mobility comments: Pt requiring max A due to cognition and not following commands to sit up and return to supine    Transfers Overall transfer level: Needs assistance Equipment used: 2 person hand held assist Transfers: Sit to/from Stand Sit to Stand: Min assist           General transfer comment: Min physical assist to stand, max assist for safety and multimodal cuing.     Balance Overall balance assessment: Needs assistance Sitting-balance support: Feet supported, No upper extremity supported Sitting balance-Leahy Scale: Fair Sitting balance - Comments: pt able to maintain EOB balance with close min guard with no UE support   Standing balance support: Single extremity supported, No upper extremity supported, During functional activity Standing balance-Leahy Scale: Fair Standing balance comment: Pt completed dynamic balance reaching for items in standing, min guard for safety                           ADL either performed or assessed with clinical judgement   ADL Overall ADL's : Needs assistance/impaired                                       General ADL Comments: At this time, due to cognition, pt requiring total assist  Extremity/Trunk Assessment              Vision       Perception     Praxis      Cognition Arousal/Alertness: Awake/alert Behavior During Therapy: WFL for tasks assessed/performed Overall Cognitive Status: History of cognitive impairments - at baseline                                 General Comments: Pt following 25% of commands, has  history of dementia. Hallcinating things in the room. Unable to state "tooth brush" when presented with the item.        Exercises      Shoulder Instructions       General Comments VSS on RA, pt hallucinating with difficutlies follow commands this session.    Pertinent Vitals/ Pain       Pain Assessment Pain Assessment: No/denies pain  Home Living                                          Prior Functioning/Environment              Frequency  Min 2X/week        Progress Toward Goals  OT Goals(current goals can now be found in the care plan section)  Progress towards OT goals: Progressing toward goals  Acute Rehab OT Goals Patient Stated Goal: None statedc OT Goal Formulation: Patient unable to participate in goal setting Time For Goal Achievement: 08/15/21 Potential to Achieve Goals: Fair ADL Goals Additional ADL Goal #1: Pt will follow 1 step commands 50% of the time during session. Additional ADL Goal #2: Pt will attend to tasks presented to her for 3+ mins.  Plan Discharge plan remains appropriate;Frequency remains appropriate    Co-evaluation    PT/OT/SLP Co-Evaluation/Treatment: Yes Reason for Co-Treatment: Complexity of the patient's impairments (multi-system involvement);Necessary to address cognition/behavior during functional activity PT goals addressed during session: Mobility/safety with mobility OT goals addressed during session: ADL's and self-care      AM-PAC OT "6 Clicks" Daily Activity     Outcome Measure   Help from another person eating meals?: Total Help from another person taking care of personal grooming?: Total Help from another person toileting, which includes using toliet, bedpan, or urinal?: Total Help from another person bathing (including washing, rinsing, drying)?: Total Help from another person to put on and taking off regular upper body clothing?: Total Help from another person to put on and taking off  regular lower body clothing?: Total 6 Click Score: 6    End of Session    OT Visit Diagnosis: Unsteadiness on feet (R26.81);Other abnormalities of gait and mobility (R26.89);Muscle weakness (generalized) (M62.81);Other symptoms and signs involving cognitive function   Activity Tolerance Other (comment) (limited by cognition)   Patient Left in bed;with call bell/phone within reach;with bed alarm set;with nursing/sitter in room   Nurse Communication Mobility status        Time: 3419-6222 OT Time Calculation (min): 23 min  Charges: OT General Charges $OT Visit: 1 Visit OT Treatments $Self Care/Home Management : 8-22 mins  Marlyn Tondreau H., OTR/L Acute Rehabilitation  Blandon Offerdahl Elane Yolanda Bonine 08/04/2021, 4:26 PM

## 2021-08-04 NOTE — Assessment & Plan Note (Signed)
Optimal. Continue to monitor.

## 2021-08-04 NOTE — Progress Notes (Addendum)
Neurology Progress Note  Brief HPI: Patient with history of dementia, depression, T2DM, HLD, HTN, hypothyroidism, CAD, CKD3 and breast cancer was found at home covered in feces and with head lice after her husband (primary caregiver) was hospitalized for several days.  Patient was confused and sometimes combative towards caregivers.  MRI brain reveals signal abnormality in posterior left temporal lobe concerning for infection vs. malignancy.  Subjective: Patient is pleasantly confused, oriented to self only.  Exam: Vitals:   08/04/21 0413 08/04/21 0731  BP: 136/76 135/80  Pulse: 67 86  Resp: 20 18  Temp: 98 F (36.7 C) 97.6 F (36.4 C)  SpO2: 98% 100%   Gen: In bed, NAD Resp: non-labored breathing, no acute distress Abd: soft, nt  Neuro: Mental Status: A&O x1, unable to name objects, able to answer questions intermittently but answers often have nothing to do with the question asked.  1/3 registration, 0/3 recall.  Speech is fluent but nonsensical.  Patient will intermittently follow simple commands.  Cranial Nerves:PERRL, EOMI, facial sensation symmetric, smile symmetrical, hearing intact to voice, phonation normal, tongue midline Motor: patient moves all extremities with 5/5 strength Sensory:sensation intact to light touch DTR: 2+ triceps, unable to elicit patellar (possibly due to patient cooperation) Gait: Deferred  Pertinent Labs: CBC Latest Ref Rng & Units 08/04/2021 08/03/2021 08/02/2021  WBC 4.0 - 10.5 K/uL 6.8 6.8 6.9  Hemoglobin 12.0 - 15.0 g/dL 11.7(L) 11.6(L) 12.5  Hematocrit 36.0 - 46.0 % 36.5 36.3 40.1  Platelets 150 - 400 K/uL 263 281 293    BMP Latest Ref Rng & Units 08/04/2021 08/03/2021 08/02/2021  Glucose 70 - 99 mg/dL 121(H) 78 126(H)  BUN 8 - 23 mg/dL 10 20 25(H)  Creatinine 0.44 - 1.00 mg/dL 1.02(H) 1.23(H) 1.36(H)  BUN/Creat Ratio 6 - 22 (calc) - - -  Sodium 135 - 145 mmol/L 140 142 145  Potassium 3.5 - 5.1 mmol/L 4.0 3.7 3.4(L)  Chloride 98 - 111 mmol/L 108 110  109  CO2 22 - 32 mmol/L 21(L) 23 27  Calcium 8.9 - 10.3 mg/dL 8.2(L) 8.4(L) 8.8(L)    TSH 3.703  CSF glucose 77, RBC 4, WBC 1, protein 53 Imaging Reviewed:  MRI brain w/wo contrast reveals signal abnormality in posterior left temporal lobe with irregular enhancement and mild restricted diffusion.  Probably increased mildly expansile T2/FLAIR hyperintensity in bilateral parietal and temporal lobes  Assessment:  1) Confused patient who is intermittently cooperative with exam.  Spoke with patient's husband Marie [name], who states that at baseline, she is oriented to self only and requires assistance with ADLs.  He states that at times, she is uncooperative with care and can become combative during activities like bathing.  He states that she has been like this for about a year and that he is having difficulties caring for her at home.  Patient's current state seems similar to her baseline.  Patient will need SNF placement at discharge.   2) LP reveals elevated protein and glucose, concerning for viral meningitis.  CSF PCR for HSV would be helpful. 3) Abnormalities seen on MRI brain could be representative of malignancy, patient will need CT chest, abdomen and pelvis to detect primary source.  Abnormalities could also be due to viral infection or inflammation, to include PRES or vasculitis.  Recommendations: 1)EEG to rule out seizure activity 2) CT chest, abdomen and pelvis to rule out primary source of malignancy 3) Continue antivirals, OK to discontinue antibiotics as bacterial meningitis has been ruled out. 4) CSF PCR  for HSV would be helpful, unsure if a sufficient sample is present 5) consider brain biopsy to further characterize abnormalities seen on MRI  [place] , MSN, AGACNP-BC [facility] See Amion for schedule and pager information 08/04/2021 10:32 AM   ATTENDING ATTESTATION:  Pt in poor living conditions at home. H/o breast cancer poor medical f/u. Likely  baseline dementia. Brain MRI concerning for possible malignancy such as lymphoma or cellular tumor or PRESS. LP neg so far. Con't antivirals until HSV PCR resulted. EEG today. Otherwise plan as above.  Dr. Reeves Forth evaluated pt independently, reviewed imaging, chart, labs. Discussed and formulated plan with the APP. Please see APP note above for details.   Total 35 minutes spent on counseling patient and coordinating care, writing notes and reviewing chart.   Marie Dumais,MD

## 2021-08-04 NOTE — Progress Notes (Signed)
[facility]                                                                               [name], is a 71 y.o. female, DOB - 05/01/1951, POE:[id number] Admit date - 07/31/2021    Outpatient Primary MD for the patient is Marie Pinto, MD  LOS - 4  days    Brief summary   Marie [name] a 71 y.o.femalewith medical history significant ofdementia with behavioral disturbance, depression, DM type 2 not on medications, HLD, HTN, hypothyroidism, mild nonobstructive CAD per cath done in May 2021, CKD stage IIIa, history of breast cancer presented to the ED via EMS and law enforcement under IVC. Patient normally lives with her husband at home who manages her ADLs. Husband was admitted to the hospital for the last 4 days and she was alone at home. Per [employer], family is unreliable and has mental health issues of their own. Husband was discharged from the hospital today and granddaughter came home before he arrived to find the patient aggravated, combative, eating her own feces and covered in feces, roaches, lice, and bedbugs. Granddaughter has taken out IVC papers. Vital signs stable on arrival to the ED. Patient was combative and was given Ativan and Haldol. Labs fairly stable except for sodium 156, BUN 41, creatinine 2.0 (baseline 1.0-1.3), UA without signs of infection. Ethanol and UDS negative. CT head concerning for subarachnoid hemorrhage. Neurosurgery consulted and requested MRI and LP. MRI attempted after patient received Haldol, but patient still combative and noted motion degraded, recommending repeat MRI with sedation for further evaluation. Neurology attempted to contact patient for consent for LP, with no luck. Patient admitted for further management.  Assessment & Plan    Assessment and Plan: * Acute metabolic encephalopathy- (present on admission) Dementia with behavioral disturbance Likely due to metabolic derangements including severe  dehydration/AKI/hypernatremia.  No fever or leukocytosis.  UA without signs of infection.  UDS negative.  Blood ethanol level undetectable.   Pt likely has baseline dementia.  MRI of the brain concerning for lymphoma .  S/p LP done, bacterial meningitis ruled out.  Meanwhile neurology recommends to continue with anti viral agents.  EEG ordered to evaluate for seizures.  CT chest, abdomen and pelvis ordered for evaluation of malignancy.  Delirium precautions.  - .  Social problem Per report from ED staff, patient's primary caregiver is her husband who was hospitalized for the past few days.  Her daughter is also currently hospitalized and admitted to the ICU.  Patient was at home with her grand children who reportedly have substances of abuse related problems.  When granddaughter went to check on the patient today, she was aggravated, combative, eating her own feces and covered in feces, roaches, lice, and bedbugs.  IVC paperwork was done per request from granddaughter. -PT/OT and social work consulted for placement  Hypernatremia- (present on admission) Likely due to severe dehydration.  She has very dry mucous membranes and decreased skin turgor.  Sodium 156.   Resolved with IV fluids.   Acute renal failure superimposed on stage 3a chronic kidney disease (HCC) Likely prerenal azotemia from severe dehydration. BUN 41, creatinine 2.0 (baseline 1.0-1.3). -  much improved with IV fluids.  - continue to monitor.   Essential hypertension- (present on admission) Optimal. Continue to monitor.   Type 2 diabetes mellitus with stage 3a chronic kidney disease, without long-term current use of insulin ([place])- (present on admission) A1c is 6.3%. Continue with SSI.  CBG (last 3)  Recent Labs    08/03/21 2355 08/04/21 0407 08/04/21 1219  GLUCAP 110* 110* 133*            Estimated body mass index is 18.67 kg/m as calculated from the following:   Height as of this encounter: 5' 4.5" (1.638  m).   Weight as of this encounter: 50.1 kg.  Code Status: Full code.  DVT Prophylaxis:  SCDs Start: 07/31/21 2303   Level of Care: Level of care: Progressive Family Communication: None at bedside.   Disposition Plan:     Remains inpatient appropriate:  Unsafe d/c plan .   Procedures:  None.   Consultants:   None.   Antimicrobials:   Anti-infectives (From admission, onward)   Start     Dose/Rate Route Frequency Ordered Stop   08/03/21 2300  vancomycin (VANCOREADY) IVPB 500 mg/100 mL  Status:  Discontinued        500 mg 100 mL/hr over 60 Minutes Intravenous Every 24 hours 08/02/21 2251 08/04/21 1408   08/03/21 0000  metroNIDAZOLE (FLAGYL) IVPB 500 mg  Status:  Discontinued        500 mg 100 mL/hr over 60 Minutes Intravenous Every 8 hours 08/02/21 2240 08/04/21 1408   08/03/21 0000  acyclovir (ZOVIRAX) 500 mg in dextrose 5 % 100 mL IVPB        10 mg/kg  50.1 kg 110 mL/hr over 60 Minutes Intravenous Every 12 hours 08/02/21 2253     08/02/21 2345  vancomycin (VANCOCIN) IVPB 1000 mg/200 mL premix        1,000 mg 200 mL/hr over 60 Minutes Intravenous  Once 08/02/21 2249 08/03/21 0229   08/02/21 2330  cefTRIAXone (ROCEPHIN) 2 g in sodium chloride 0.9 % 100 mL IVPB  Status:  Discontinued        2 g 200 mL/hr over 30 Minutes Intravenous Every 12 hours 08/02/21 2240 08/04/21 1408        Medications  Scheduled Meds:  mouth rinse  15 mL Mouth Rinse BID   Continuous Infusions:  sodium chloride 125 mL/hr at 08/04/21 1515   acyclovir (ZOVIRAX) </= 700 mg IVPB Stopped (08/04/21 1354)   PRN Meds:.acetaminophen **OR** acetaminophen    Subjective:   Skylene Deremer was seen and examined today.  No new complaints.   Objective:   Vitals:   08/04/21 0731 08/04/21 1100 08/04/21 1205 08/04/21 1549  BP: 135/80  135/69 123/68  Pulse: 86 88 88 100  Resp: 18  19 18   Temp: 97.6 F (36.4 C) 98.2 F (36.8 C) 98.2 F (36.8 C) 97.6 F (36.4 C)  TempSrc: Axillary Axillary  Axillary Axillary  SpO2: 100%  99% 100%  Weight:      Height:        Intake/Output Summary (Last 24 hours) at 08/04/2021 1823 Last data filed at 08/04/2021 1515 Gross per 24 hour  Intake 3303 ml  Output --  Net 3303 ml   Filed Weights   07/31/21 1734 07/31/21 2300  Weight: 59 kg 50.1 kg    General exam: Appears calm and comfortable  Respiratory system: Clear to auscultation. Respiratory effort normal. Cardiovascular system: S1 & S2 heard, RRR. No JVD,  No pedal  edema. Gastrointestinal system: Abdomen is nondistended, soft and nontender. Normal bowel sounds heard. Central nervous system: Alert and oriented to person only.  Extremities:No pedal edema.  Skin: No rashes, lesions or ulcers Psychiatry:mood is normal.    Data Reviewed:  I have personally reviewed following labs and imaging studies   CBC Lab Results  Component Value Date   WBC 6.8 08/04/2021   RBC 4.14 08/04/2021   HGB 11.7 (L) 08/04/2021   HCT 36.5 08/04/2021   MCV 88.2 08/04/2021   MCH 28.3 08/04/2021   PLT 263 08/04/2021   MCHC 32.1 08/04/2021   RDW 13.2 08/04/2021   LYMPHSABS 0.5 (L) 08/04/2021   MONOABS 0.4 08/04/2021   EOSABS 0.8 (H) 08/04/2021   BASOSABS 0.0 64/15/8309     Last metabolic panel Lab Results  Component Value Date   NA 140 08/04/2021   K 4.0 08/04/2021   CL 108 08/04/2021   CO2 21 (L) 08/04/2021   BUN 10 08/04/2021   CREATININE 1.02 (H) 08/04/2021   GLUCOSE 121 (H) 08/04/2021   GFRNONAA 60 (L) 08/04/2021   GFRAA 62 09/02/2020   CALCIUM 8.2 (L) 08/04/2021   PROT 7.8 07/31/2021   ALBUMIN 3.6 07/31/2021   BILITOT 0.6 07/31/2021   ALKPHOS 75 07/31/2021   AST 33 07/31/2021   ALT 17 07/31/2021   ANIONGAP 11 08/04/2021    CBG (last 3)  Recent Labs    08/03/21 2355 08/04/21 0407 08/04/21 1219  GLUCAP 110* 110* 133*      Coagulation Profile: No results for input(s): INR, PROTIME in the last 168 hours.   Radiology Studies: CT CHEST ABDOMEN PELVIS W  CONTRAST  Result Date: 08/04/2021 CLINICAL DATA:  Occult malignancy. Abnormal brain imaging to rule out primary tumor or malignancy. Confusion. EXAM: CT CHEST, ABDOMEN, AND PELVIS WITH CONTRAST TECHNIQUE: Multidetector CT imaging of the chest, abdomen and pelvis was performed following the standard protocol during bolus administration of intravenous contrast. RADIATION DOSE REDUCTION: This exam was performed according to the departmental dose-optimization program which includes automated exposure control, adjustment of the mA and/or kV according to patient size and/or use of iterative reconstruction technique. CONTRAST:  128mL OMNIPAQUE IOHEXOL 300 MG/ML  SOLN COMPARISON:  CT chest 11/04/2019 FINDINGS: CT CHEST FINDINGS Cardiovascular: Normal heart size. No pericardial effusions. Normal caliber thoracic aorta. No aortic dissection. Great vessel origins are patent. Calcification in the aorta and coronary arteries. Main pulmonary arteries are patent without evidence of large central pulmonary embolus. Venous gas is present, likely resulting from intravenous injections. Mediastinum/Nodes: Small esophageal hiatal hernia. Esophagus is decompressed. No significant lymphadenopathy. Lungs/Pleura: Subpleural scarring along the right anterior chest wall may represent radiation change. Mild dependent changes in the lung bases. No focal consolidation or edema. No pulmonary mass lesion. No pleural effusions. No pneumothorax. Musculoskeletal: Postoperative changes in the right humerus. Degenerative changes in the spine. No destructive bone lesions. Anterior compression of T6 and T7 vertebrae, unchanged. CT ABDOMEN PELVIS FINDINGS Hepatobiliary: Cholelithiasis. No evidence of cholecystitis. No focal liver lesions. No bile duct dilatation. Pancreas: Unremarkable. No pancreatic ductal dilatation or surrounding inflammatory changes. Spleen: Normal in size without focal abnormality. Adrenals/Urinary Tract: No adrenal gland nodules.  Several cysts demonstrated on the right kidney, largest in the upper pole measuring 4.1 cm diameter. Appearance is consistent with typically benign lesion and unchanged since prior study. No follow-up is indicated. Nephrograms are symmetrical. No hydronephrosis or hydroureter. Bladder is unremarkable. Stomach/Bowel: Stomach, small bowel, and colon are not abnormally distended. Scattered stool throughout the colon.  No wall thickening or inflammatory changes are appreciated. Appendix is not identified. Vascular/Lymphatic: Aortic atherosclerosis. No enlarged abdominal or pelvic lymph nodes. Reproductive: Uterus and bilateral adnexa are unremarkable. Other: No abdominal wall hernia or abnormality. No abdominopelvic ascites. Musculoskeletal: Degenerative changes in the spine. No destructive bone lesions. IMPRESSION: 1. No primary mass or metastatic disease demonstrated in the chest abdomen or pelvis. 2. Small esophageal hiatal hernia. 3. Cholelithiasis without evidence of cholecystitis. 4. Aortic atherosclerosis. 5. Old compression deformities of T6 and T7 vertebrae, unchanged. Electronically Signed   By: Lucienne Capers M.D.   On: 08/04/2021 18:05   DG FLUORO GUIDE LUMBAR PUNCTURE  Result Date: 08/03/2021 CLINICAL DATA:  Acute metabolic encephalopathy EXAM: DIAGNOSTIC LUMBAR PUNCTURE UNDER FLUOROSCOPIC GUIDANCE COMPARISON:  None FLUOROSCOPY: Fluoroscopy Time: 12 seconds Radiation Exposure Index (if provided by the fluoroscopic device): 1.4 mGy Number of Acquired Spot Images: 0 PROCEDURE: Informed consent was obtained from the patient's son, [name], prior to the procedure, including potential complications of headache, allergy, and pain. With the patient prone, the lower back was prepped with Betadine. 1% Lidocaine was used for local anesthesia. Lumbar puncture was performed at the L3-L4 level using a 20 gauge needle with return of clear CSF. Opening pressure was deferred due to slow/low flow and patient cooperation  limitations. 39ml of CSF were obtained for laboratory studies. The patient struggled to tolerate the procedure well and ultimately dislodged placement of needle with arching of her back. The needle was removed and procedure terminated. No apparent complications. IMPRESSION: Technically successful lumbar puncture with limited fluid retrieval as described above. Approximately 7 mL of clear CSF was collected and sent to the laboratory for analysis. Read and performed by: Alexandria Lodge, PA-C Electronically Signed   By: Maurine Simmering M.D.   On: 08/03/2021 16:32       Hosie Poisson M.D. [facility] 08/04/2021, 6:23 PM  Available via Epic secure chat 7am-7pm After 7 pm, please refer to night coverage provider listed on amion.

## 2021-08-04 NOTE — NC FL2 (Signed)
[place] MEDICAID FL2 LEVEL OF CARE SCREENING TOOL     IDENTIFICATION  Patient Name: Marie [name] Birthdate: 09/10/1950 Sex: female Admission Date (Current Location): 07/31/2021  [name] and [place] Number:  Herbalist and Address:  [facility] [place], [place] [address] [place], [place] [address]      Provider Number: [id number]  Attending Physician Name and Address:  Hosie Poisson, MD  Relative Name and Phone Number:       Current Level of Care: Hospital Recommended Level of Care: [place] Prior Approval Number:    Date Approved/Denied:   PASRR Number: [id number]  Discharge Plan: SNF    Current Diagnoses: Patient Active Problem List   Diagnosis Date Noted   Acute metabolic encephalopathy 95/18/8416   Acute renal failure superimposed on stage 3a chronic kidney disease ([place]) 07/31/2021   Hypernatremia 07/31/2021   Social problem 07/31/2021   Cellulitis of right knee 09/02/2020   Intertriginous candidiasis 09/02/2020   Cellulitis of breast 09/02/2020   Agitation due to dementia 09/02/2020   Hallucinations 09/02/2020   History of SCC (squamous cell carcinoma) of skin (chest, 03/2019) 04/22/2020   Osteopenia 01/21/2020   Alzheimer's dementia with behavioral disturbance ([place]) 01/21/2020   Aortic atherosclerosis ([place]) by CTA scan on 11/04/2019 01/20/2020   CAD (coronary artery disease) 11/06/2019   History of NSTEMI - 11/04/19 11/04/2019   Malignant neoplasm of upper-outer quadrant of right breast in female, estrogen receptor positive ([place]) 02/13/2018   CKD stage 3 due to type 2 diabetes mellitus ([place]) 10/04/2017   BMI 22.0-22.9, adult 05/05/2015   Generalized anxiety disorder 08/04/2014   Essential hypertension 04/28/2014   Medication management 04/28/2014   Vitamin D deficiency 10/04/2013   Type 2 diabetes mellitus with stage 3a chronic kidney disease, without long-term current use of insulin (HCC)    Depression, major,  recurrent, in partial remission ([place])    SDAT     Hyperlipidemia associated with type 2 diabetes mellitus ([place]) 07/28/2007   Allergic rhinitis 07/28/2007    Orientation RESPIRATION BLADDER Height & Weight     Self  Normal Incontinent Weight: 110 lb 7.2 oz (50.1 kg) (bed was zeroed immediately prior to pt arrival) Height:  5' 4.5" (163.8 cm)  BEHAVIORAL SYMPTOMS/MOOD NEUROLOGICAL BOWEL NUTRITION STATUS      Incontinent Diet (carb modified)  AMBULATORY STATUS COMMUNICATION OF NEEDS Skin   Extensive Assist Verbally Normal                       Personal Care Assistance Level of Assistance  Bathing, Feeding, Dressing Bathing Assistance: Maximum assistance Feeding assistance: Limited assistance Dressing Assistance: Maximum assistance     Functional Limitations Info             SPECIAL CARE FACTORS FREQUENCY  PT (By licensed PT), OT (By licensed OT)     PT Frequency: 5x/wk OT Frequency: 5x/wk            Contractures Contractures Info: Not present    Additional Factors Info  Code Status, Allergies Code Status Info: Full Allergies Info: Penicillins           Current Medications (08/04/2021):  This is the current hospital active medication list Current Facility-Administered Medications  Medication Dose Route Frequency Provider Last Rate Last Admin   0.9 %  sodium chloride infusion   Intravenous Continuous Alvira Philips, [place] 125 mL/hr at 08/04/21 1040 New Bag at 08/04/21 1040   acetaminophen (TYLENOL) tablet  650 mg  650 mg Oral Q6H PRN Shela Leff, MD       Or   acetaminophen (TYLENOL) suppository 650 mg  650 mg Rectal Q6H PRN Shela Leff, MD       acyclovir (ZOVIRAX) 500 mg in dextrose 5 % 100 mL IVPB  10 mg/kg Intravenous Q12H Alvira Philips, RPH 110 mL/hr at 08/04/21 1253 500 mg at 08/04/21 1253   cefTRIAXone (ROCEPHIN) 2 g in sodium chloride 0.9 % 100 mL IVPB  2 g Intravenous Q12H Kerney Elbe, MD 200 mL/hr at 08/04/21 1042 2 g at  08/04/21 1042   MEDLINE mouth rinse  15 mL Mouth Rinse BID Shela Leff, MD   15 mL at 08/04/21 1046   metroNIDAZOLE (FLAGYL) IVPB 500 mg  500 mg Intravenous Q8H Kerney Elbe, MD 100 mL/hr at 08/04/21 0928 500 mg at 08/04/21 0928   vancomycin (VANCOREADY) IVPB 500 mg/100 mL  500 mg Intravenous Q24H Alvira Philips, RPH 100 mL/hr at 08/03/21 2310 500 mg at 08/03/21 2310     Discharge Medications: Please see discharge summary for a list of discharge medications.  Relevant Imaging Results:  Relevant Lab Results:   Additional Information SS#: [id number]  Geralynn Ochs, LCSW

## 2021-08-05 DIAGNOSIS — I10 Essential (primary) hypertension: Secondary | ICD-10-CM

## 2021-08-05 DIAGNOSIS — F03918 Unspecified dementia, unspecified severity, with other behavioral disturbance: Secondary | ICD-10-CM

## 2021-08-05 LAB — CBC WITH DIFFERENTIAL/PLATELET
Abs Immature Granulocytes: 0.02 10*3/uL (ref 0.00–0.07)
Basophils Absolute: 0.1 10*3/uL (ref 0.0–0.1)
Basophils Relative: 1 %
Eosinophils Absolute: 0.8 10*3/uL — ABNORMAL HIGH (ref 0.0–0.5)
Eosinophils Relative: 9 %
HCT: 38.3 % (ref 36.0–46.0)
Hemoglobin: 12.2 g/dL (ref 12.0–15.0)
Immature Granulocytes: 0 %
Lymphocytes Relative: 6 %
Lymphs Abs: 0.5 10*3/uL — ABNORMAL LOW (ref 0.7–4.0)
MCH: 28.3 pg (ref 26.0–34.0)
MCHC: 31.9 g/dL (ref 30.0–36.0)
MCV: 88.9 fL (ref 80.0–100.0)
Monocytes Absolute: 0.5 10*3/uL (ref 0.1–1.0)
Monocytes Relative: 6 %
Neutro Abs: 6.7 10*3/uL (ref 1.7–7.7)
Neutrophils Relative %: 78 %
Platelets: 286 10*3/uL (ref 150–400)
RBC: 4.31 MIL/uL (ref 3.87–5.11)
RDW: 13.2 % (ref 11.5–15.5)
WBC: 8.7 10*3/uL (ref 4.0–10.5)
nRBC: 0 % (ref 0.0–0.2)

## 2021-08-05 LAB — IGG CSF INDEX
Albumin CSF-mCnc: 23 mg/dL (ref 8–37)
Albumin: 2.9 g/dL — ABNORMAL LOW (ref 3.8–4.8)
CSF IgG Index: 0.6 (ref 0.0–0.7)
IgG (Immunoglobin G), Serum: 872 mg/dL (ref 586–1602)
IgG, CSF: 4.2 mg/dL (ref 0.0–6.7)
IgG/Alb Ratio, CSF: 0.18 (ref 0.00–0.25)

## 2021-08-05 LAB — BASIC METABOLIC PANEL
Anion gap: 10 (ref 5–15)
BUN: 10 mg/dL (ref 8–23)
CO2: 24 mmol/L (ref 22–32)
Calcium: 8.4 mg/dL — ABNORMAL LOW (ref 8.9–10.3)
Chloride: 103 mmol/L (ref 98–111)
Creatinine, Ser: 1.07 mg/dL — ABNORMAL HIGH (ref 0.44–1.00)
GFR, Estimated: 56 mL/min — ABNORMAL LOW (ref >60)
Glucose, Bld: 118 mg/dL — ABNORMAL HIGH (ref 70–99)
Potassium: 3.4 mmol/L — ABNORMAL LOW (ref 3.5–5.1)
Sodium: 137 mmol/L (ref 135–145)

## 2021-08-05 LAB — GLUCOSE, CAPILLARY: Glucose-Capillary: 124 mg/dL — ABNORMAL HIGH (ref 70–99)

## 2021-08-05 LAB — VDRL, CSF: VDRL Quant, CSF: NONREACTIVE

## 2021-08-05 MED FILL — Ondansetron HCl Inj 4 MG/2ML (2 MG/ML): INTRAMUSCULAR | Qty: 2 | Status: AC

## 2021-08-05 MED FILL — Propofol IV Emul 200 MG/20ML (10 MG/ML): INTRAVENOUS | Qty: 13 | Status: AC

## 2021-08-05 MED FILL — Phenylephrine HCl IV Soln Pref Syr 0.4 MG/10ML (40 MCG/ML): INTRAVENOUS | Qty: 20 | Status: AC

## 2021-08-05 NOTE — TOC Progression Note (Signed)
Transition of Care [facility]) - Progression Note    Patient Details  Name: Marie [name] MRN: [id number] Date of Birth: February 16, 1951  Transition of Care [facility]) CM/SW [place], [place] Phone Number: 08/05/2021, 11:30 AM  Clinical Narrative:    CSW following for discharge plan. Patient has been faxed out for SNF, no bed offers at this time. CSW spoke with granddaughter, patient's son has guardianship papers and is planning to turn them into the courthouse either today or tomorrow. Son to contact CSW when papers have been filed. CSW has yet to receive a call from [place] worker, but case has been accepted.   Noting plan for palliative team meeting with husband, potentially tomorrow. CSW to follow.    Expected Discharge Plan: [place] Barriers to Discharge: Continued Medical Work up, Ship broker  Expected Discharge Plan and Services Expected Discharge Plan: [place]     Post Acute Care Choice: NA Living arrangements for the past 2 months: Single Family Home                                       Social Determinants of Health (SDOH) Interventions    Readmission Risk Interventions No flowsheet data found.
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
## 2021-08-05 NOTE — Progress Notes (Signed)
Pharmacy Antibiotic Note  ROMELL CAVANAH is a 71 y.o. female admitted on 07/31/2021 with encephalopathy.  Pharmacy has been consulted for acyclovir dosing for possible HSV encephalitis. Remains afebrile, WBC normal, and SCr is 1.07 (baseline ~1.1-1.3).   Plan: Acyclovir 500 mg IV q12h (~10 mg/kg/dose) Monitor renal function, clinical progress, cultures/sensitivities F/U LOT and CSF PCR for HSV   Height: 5' 4.5" (163.8 cm) Weight: 50.1 kg (110 lb 7.2 oz) (bed was zeroed immediately prior to pt arrival) IBW/kg (Calculated) : 55.85  Temp (24hrs), Avg:97.7 F (36.5 C), Min:97.5 F (36.4 C), Max:97.9 F (36.6 C)  Recent Labs  Lab 08/01/21 0304 08/01/21 0654 08/01/21 2014 08/02/21 0809 08/03/21 0238 08/04/21 0237 08/05/21 0112  WBC 9.8  --   --  6.9 6.8 6.8 8.7  CREATININE 1.86*   < > 1.41* 1.36* 1.23* 1.02* 1.07*   < > = values in this interval not displayed.    Estimated Creatinine Clearance: 39.2 mL/min (A) (by C-G formula based on SCr of 1.07 mg/dL (H)).    Allergies  Allergen Reactions   Penicillins Rash    Swelling in face - last 10 years    Antimicrobials this admission: Vancomycin 2/6 >> 2/8 Ceftriaxone 2/6 >> 2/8 Metronidazole 2/6 >>2/8 Acyclovir 2/6 >>   Thank you for allowing pharmacy to be a part of this patients care.  Ardyth Harps, PharmD Clinical Pharmacist

## 2021-08-05 NOTE — Progress Notes (Signed)
[facility]                                                                               Marie [name], is a 71 y.o. female, DOB - 06/08/1951, FXT:[id number] Admit date - 07/31/2021    Outpatient Primary MD for the patient is [name], MD  LOS - 5  days    Brief summary   Marie [name] is a 71 y.o. female with medical history significant of dementia with behavioral disturbance, depression, DM type 2 not on medications, HLD, HTN, hypothyroidism, mild nonobstructive CAD per cath done in May 2021, CKD stage IIIa, history of breast cancer presented to the ED via EMS and law enforcement under IVC. Patient normally lives with her husband at home who manages her ADLs. Husband was admitted to the hospital for the last 4 days and she was alone at home. Per [employer], family is unreliable and has mental health issues of their own. Husband was discharged from the hospital today and granddaughter came home before he arrived to find the patient aggravated, combative, eating her own feces and covered in feces, roaches, lice, and bedbugs.  Granddaughter has taken out IVC papers.  Vital signs stable on arrival to the ED.  Patient was combative and was given Ativan and Haldol.  Labs fairly stable except for sodium 156, BUN 41, creatinine 2.0 (baseline 1.0-1.3), UA without signs of infection. Ethanol and UDS negative. CT head concerning for subarachnoid hemorrhage. Neurosurgery consulted and requested MRI and LP. MRI attempted after patient received Haldol, but patient still combative and noted motion degraded, recommending repeat MRI with sedation for further evaluation. Neurology attempted to contact patient for consent for LP, with no luck. Patient admitted for further management.  Assessment & Plan    Assessment and Plan: * Acute metabolic encephalopathy- (present on admission) Dementia with behavioral disturbance Likely due to metabolic derangements including severe  dehydration/AKI/hypernatremia.  No fever or leukocytosis.  UA without signs of infection.  UDS negative.  Blood ethanol level undetectable.   Pt likely has baseline dementia.  MRI of the brain concerning for lymphoma .  S/p LP done, bacterial meningitis ruled out. Continue with acyclovir . HSV pcr is pending.  Meanwhile neurology recommends to continue with anti viral agents.  EEG ordered to evaluate for seizures.  CT chest, abdomen and pelvis ordered for evaluation of malignancy did not show any acute pathology. .  Delirium precautions.   Social problem Per report from ED staff, patient's primary caregiver is her husband who was hospitalized for the past few days.  Her daughter is also currently hospitalized and admitted to the ICU.  Patient was at home with her grand children who reportedly have substances of abuse related problems.  When granddaughter went to check on the patient today, she was aggravated, combative, eating her own feces and covered in feces, roaches, lice, and bedbugs.  IVC paperwork was done per request from granddaughter. -PT/OT and social work consulted for placement - palliative care consulted for goals of care.   Hypernatremia- (present on admission) Likely due to severe dehydration.  She has very dry mucous membranes and decreased skin turgor.  Sodium 156.  Resolved with IV fluids.   Acute renal failure superimposed on stage 3a chronic kidney disease (HCC) Likely prerenal azotemia from severe dehydration. BUN 41, creatinine 2.0 (baseline 1.0-1.3). -much improved with IV fluids.  - continue to monitor.   Essential hypertension- (present on admission) Well controlled.  Continue to monitor.   Type 2 diabetes mellitus with stage 3a chronic kidney disease, without long-term current use of insulin ([place])- (present on admission) A1c is 6.3%. Continue with SSI.  CBG (last 3)  Recent Labs    08/03/21 2355 08/04/21 0407 08/04/21 1219  GLUCAP 110* 110* 133*    Encourage intake.          Estimated body mass index is 18.67 kg/m as calculated from the following:   Height as of this encounter: 5' 4.5" (1.638 m).   Weight as of this encounter: 50.1 kg.  Code Status: Full code.  DVT Prophylaxis:  SCDs Start: 07/31/21 2303   Level of Care: Level of care: Med-Surg Family Communication: None at bedside.   Disposition Plan:     Remains inpatient appropriate:  pending HSV PCR, IV anti virals  Procedures:  LP  Consultants:   Neurology.   Antimicrobials:   Anti-infectives (From admission, onward)    Start     Dose/Rate Route Frequency Ordered Stop   08/03/21 2300  vancomycin (VANCOREADY) IVPB 500 mg/100 mL  Status:  Discontinued        500 mg 100 mL/hr over 60 Minutes Intravenous Every 24 hours 08/02/21 2251 08/04/21 1408   08/03/21 0000  metroNIDAZOLE (FLAGYL) IVPB 500 mg  Status:  Discontinued        500 mg 100 mL/hr over 60 Minutes Intravenous Every 8 hours 08/02/21 2240 08/04/21 1408   08/03/21 0000  acyclovir (ZOVIRAX) 500 mg in dextrose 5 % 100 mL IVPB        10 mg/kg  50.1 kg 110 mL/hr over 60 Minutes Intravenous Every 12 hours 08/02/21 2253     08/02/21 2345  vancomycin (VANCOCIN) IVPB 1000 mg/200 mL premix        1,000 mg 200 mL/hr over 60 Minutes Intravenous  Once 08/02/21 2249 08/03/21 0229   08/02/21 2330  cefTRIAXone (ROCEPHIN) 2 g in sodium chloride 0.9 % 100 mL IVPB  Status:  Discontinued        2 g 200 mL/hr over 30 Minutes Intravenous Every 12 hours 08/02/21 2240 08/04/21 1408        Medications  Scheduled Meds:  mouth rinse  15 mL Mouth Rinse BID   Continuous Infusions:  sodium chloride 75 mL/hr at 08/05/21 1004   acyclovir (ZOVIRAX) </= 700 mg IVPB 500 mg (08/05/21 1230)   PRN Meds:.acetaminophen **OR** acetaminophen    Subjective:   Harlei Lehrmann was seen and examined today.  She appears comfortable. No chest pain or sob.   Objective:   Vitals:   08/04/21 2339 08/05/21 0657 08/05/21 1226  08/05/21 1547  BP: 125/74 (!) 106/54 (!) 133/54 99/65  Pulse: 82 63 64 73  Resp: 17 16 18 16   Temp: 97.9 F (36.6 C) (!) 97.5 F (36.4 C) 97.9 F (36.6 C) 98.8 F (37.1 C)  TempSrc: Oral Oral Axillary Oral  SpO2:   100% 100%  Weight:      Height:        Intake/Output Summary (Last 24 hours) at 08/05/2021 1654 Last data filed at 08/05/2021 1434 Gross per 24 hour  Intake 800.88 ml  Output --  Net 800.88 ml  Filed Weights   07/31/21 1734 07/31/21 2300  Weight: 59 kg 50.1 kg    General exam: Appears calm and comfortable  Respiratory system: Clear to auscultation. Respiratory effort normal. Cardiovascular system: S1 & S2 heard, RRR. No JVD,  Gastrointestinal system: Abdomen is nondistended, soft and nontender.  Normal bowel sounds heard. Central nervous system: Alert and oriented to person only.  Extremities: Symmetric 5 x 5 power. Skin: No rashes, lesions or ulcers Psychiatry: mood is appropriate.     Data Reviewed:  I have personally reviewed following labs and imaging studies   CBC Lab Results  Component Value Date   WBC 8.7 08/05/2021   RBC 4.31 08/05/2021   HGB 12.2 08/05/2021   HCT 38.3 08/05/2021   MCV 88.9 08/05/2021   MCH 28.3 08/05/2021   PLT 286 08/05/2021   MCHC 31.9 08/05/2021   RDW 13.2 08/05/2021   LYMPHSABS 0.5 (L) 08/05/2021   MONOABS 0.5 08/05/2021   EOSABS 0.8 (H) 08/05/2021   BASOSABS 0.1 29/51/8841     Last metabolic panel Lab Results  Component Value Date   NA 137 08/05/2021   K 3.4 (L) 08/05/2021   CL 103 08/05/2021   CO2 24 08/05/2021   BUN 10 08/05/2021   CREATININE 1.07 (H) 08/05/2021   GLUCOSE 118 (H) 08/05/2021   GFRNONAA 56 (L) 08/05/2021   GFRAA 62 09/02/2020   CALCIUM 8.4 (L) 08/05/2021   PROT 7.8 07/31/2021   ALBUMIN 2.9 (L) 08/03/2021   BILITOT 0.6 07/31/2021   ALKPHOS 75 07/31/2021   AST 33 07/31/2021   ALT 17 07/31/2021   ANIONGAP 10 08/05/2021    CBG (last 3)  Recent Labs    08/04/21 2116 08/04/21 2338  08/05/21 0659  GLUCAP 197* 165* 124*       Coagulation Profile: No results for input(s): INR, PROTIME in the last 168 hours.   Radiology Studies: CT CHEST ABDOMEN PELVIS W CONTRAST  Result Date: 08/04/2021 CLINICAL DATA:  Occult malignancy. Abnormal brain imaging to rule out primary tumor or malignancy. Confusion. EXAM: CT CHEST, ABDOMEN, AND PELVIS WITH CONTRAST TECHNIQUE: Multidetector CT imaging of the chest, abdomen and pelvis was performed following the standard protocol during bolus administration of intravenous contrast. RADIATION DOSE REDUCTION: This exam was performed according to the departmental dose-optimization program which includes automated exposure control, adjustment of the mA and/or kV according to patient size and/or use of iterative reconstruction technique. CONTRAST:  182mL OMNIPAQUE IOHEXOL 300 MG/ML  SOLN COMPARISON:  CT chest 11/04/2019 FINDINGS: CT CHEST FINDINGS Cardiovascular: Normal heart size. No pericardial effusions. Normal caliber thoracic aorta. No aortic dissection. Great vessel origins are patent. Calcification in the aorta and coronary arteries. Main pulmonary arteries are patent without evidence of large central pulmonary embolus. Venous gas is present, likely resulting from intravenous injections. Mediastinum/Nodes: Small esophageal hiatal hernia. Esophagus is decompressed. No significant lymphadenopathy. Lungs/Pleura: Subpleural scarring along the right anterior chest wall may represent radiation change. Mild dependent changes in the lung bases. No focal consolidation or edema. No pulmonary mass lesion. No pleural effusions. No pneumothorax. Musculoskeletal: Postoperative changes in the right humerus. Degenerative changes in the spine. No destructive bone lesions. Anterior compression of T6 and T7 vertebrae, unchanged. CT ABDOMEN PELVIS FINDINGS Hepatobiliary: Cholelithiasis. No evidence of cholecystitis. No focal liver lesions. No bile duct dilatation. Pancreas:  Unremarkable. No pancreatic ductal dilatation or surrounding inflammatory changes. Spleen: Normal in size without focal abnormality. Adrenals/Urinary Tract: No adrenal gland nodules. Several cysts demonstrated on the right kidney, largest in the  upper pole measuring 4.1 cm diameter. Appearance is consistent with typically benign lesion and unchanged since prior study. No follow-up is indicated. Nephrograms are symmetrical. No hydronephrosis or hydroureter. Bladder is unremarkable. Stomach/Bowel: Stomach, small bowel, and colon are not abnormally distended. Scattered stool throughout the colon. No wall thickening or inflammatory changes are appreciated. Appendix is not identified. Vascular/Lymphatic: Aortic atherosclerosis. No enlarged abdominal or pelvic lymph nodes. Reproductive: Uterus and bilateral adnexa are unremarkable. Other: No abdominal wall hernia or abnormality. No abdominopelvic ascites. Musculoskeletal: Degenerative changes in the spine. No destructive bone lesions. IMPRESSION: 1. No primary mass or metastatic disease demonstrated in the chest abdomen or pelvis. 2. Small esophageal hiatal hernia. 3. Cholelithiasis without evidence of cholecystitis. 4. Aortic atherosclerosis. 5. Old compression deformities of T6 and T7 vertebrae, unchanged. Electronically Signed   By: Lucienne Capers M.D.   On: 08/04/2021 18:05        Hosie Poisson M.D. [facility] 08/05/2021, 4:54 PM  Available via Epic secure chat 7am-7pm After 7 pm, please refer to night coverage provider listed on amion.

## 2021-08-06 DIAGNOSIS — Z7189 Other specified counseling: Secondary | ICD-10-CM

## 2021-08-06 DIAGNOSIS — F03918 Unspecified dementia, unspecified severity, with other behavioral disturbance: Secondary | ICD-10-CM

## 2021-08-06 DIAGNOSIS — Z66 Do not resuscitate: Secondary | ICD-10-CM

## 2021-08-06 LAB — CBC WITH DIFFERENTIAL/PLATELET
Abs Immature Granulocytes: 0.04 10*3/uL (ref 0.00–0.07)
Basophils Absolute: 0.1 10*3/uL (ref 0.0–0.1)
Basophils Relative: 1 %
Eosinophils Absolute: 1 10*3/uL — ABNORMAL HIGH (ref 0.0–0.5)
Eosinophils Relative: 12 %
HCT: 36.4 % (ref 36.0–46.0)
Hemoglobin: 12 g/dL (ref 12.0–15.0)
Immature Granulocytes: 1 %
Lymphocytes Relative: 8 %
Lymphs Abs: 0.7 10*3/uL (ref 0.7–4.0)
MCH: 29 pg (ref 26.0–34.0)
MCHC: 33 g/dL (ref 30.0–36.0)
MCV: 87.9 fL (ref 80.0–100.0)
Monocytes Absolute: 0.6 10*3/uL (ref 0.1–1.0)
Monocytes Relative: 7 %
Neutro Abs: 5.9 10*3/uL (ref 1.7–7.7)
Neutrophils Relative %: 71 %
Platelets: 256 10*3/uL (ref 150–400)
RBC: 4.14 MIL/uL (ref 3.87–5.11)
RDW: 13.4 % (ref 11.5–15.5)
WBC: 8.3 10*3/uL (ref 4.0–10.5)
nRBC: 0 % (ref 0.0–0.2)

## 2021-08-06 LAB — BASIC METABOLIC PANEL
Anion gap: 7 (ref 5–15)
BUN: 7 mg/dL — ABNORMAL LOW (ref 8–23)
CO2: 24 mmol/L (ref 22–32)
Calcium: 8.4 mg/dL — ABNORMAL LOW (ref 8.9–10.3)
Chloride: 107 mmol/L (ref 98–111)
Creatinine, Ser: 1.1 mg/dL — ABNORMAL HIGH (ref 0.44–1.00)
GFR, Estimated: 54 mL/min — ABNORMAL LOW (ref >60)
Glucose, Bld: 112 mg/dL — ABNORMAL HIGH (ref 70–99)
Potassium: 3.6 mmol/L (ref 3.5–5.1)
Sodium: 138 mmol/L (ref 135–145)

## 2021-08-06 LAB — OLIGOCLONAL BANDS, CSF + SERM

## 2021-08-06 NOTE — TOC Progression Note (Addendum)
Transition of Care [facility]) - Progression Note    Patient Details  Name: Marie [name] MRN: [id number] Date of Birth: [birth year]  Transition [facility] CM/SW [place], [place] Phone Number: 08/06/2021, 2:55 PM  Clinical Narrative:   CSW received call from [place] Worker assigned to patient's case that she has spoken with granddaughter, son, and patient, but unable to reach the husband or do a home safety eval yet. APS worker also received information from son and granddaughter that they are applying for guardianship for the patient, and agreeable to placement if recommended by the hospital team. APS worker will attempt to reach husband and do a home safety eval, wanted to ensure that hospital was not planning on discharging the patient home with husband. Current recommendation is for SNF, pending goals of care discussion. CSW to follow.  UPDATE 4:00 PM: CSW spoke with palliative NP who spoke with family, plan is for long term care placement, preferably memory care, with outpatient palliative follow up. Patient's husband and son are on board with the plan. APS Worker also asked CSW to make a CPS report on the patient's grandchildren in the home, CSW left a voicemail for CPS to make report.     Expected Discharge Plan: [place] Barriers to Discharge: Continued Medical Work up, Ship broker  Expected Discharge Plan and Services Expected Discharge Plan: [place]     Post Acute Care Choice: NA Living arrangements for the past 2 months: Single Family Home                                       Social Determinants of Health (SDOH) Interventions    Readmission Risk Interventions No flowsheet data found.

## 2021-08-06 NOTE — Progress Notes (Signed)
[facility]                                                                               Marie [name], is a 71 y.o. female, DOB - [birth year], EQA:[id number] Admit date - 07/31/2021    Outpatient Primary MD for the patient is [name], MD  LOS - 6  days    Brief summary   Marie [name] is a 71 y.o. female with medical history significant of dementia with behavioral disturbance, depression, DM type 2 not on medications, HLD, HTN, hypothyroidism, mild nonobstructive CAD per cath done in May 2021, CKD stage IIIa, history of breast cancer presented to the ED via EMS and law enforcement under IVC. Patient normally lives with her husband at home who manages her ADLs. Husband was admitted to the hospital for the last 4 days and she was alone at home. Per [employer], family is unreliable and has mental health issues of their own. Husband was discharged from the hospital today and granddaughter came home before he arrived to find the patient aggravated, combative, eating her own feces and covered in feces, roaches, lice, and bedbugs.  Granddaughter has taken out IVC papers.  Vital signs stable on arrival to the ED.  Patient was combative and was given Ativan and Haldol.  Labs fairly stable except for sodium 156, BUN 41, creatinine 2.0 (baseline 1.0-1.3), UA without signs of infection. Ethanol and UDS negative. CT head concerning for subarachnoid hemorrhage. Neurosurgery consulted and requested MRI and LP. MRI attempted after patient received Haldol, but patient still combative and noted motion degraded, recommending repeat MRI with sedation for further evaluation. Neurology attempted to contact patient for consent for LP, with no luck. Patient admitted for further management.  Assessment & Plan    Assessment and Plan: * Acute metabolic encephalopathy- (present on admission) Dementia with behavioral disturbance Likely due to metabolic derangements including severe  dehydration/AKI/hypernatremia.  No fever or leukocytosis.  UA without signs of infection.  UDS negative.  Blood ethanol level undetectable.   Pt likely has baseline dementia.  MRI of the brain concerning for lymphoma .  S/p LP done, bacterial meningitis ruled out. Continue with acyclovir . HSV pcr is pending.  Meanwhile neurology recommends to continue with anti viral agents.  EEG ordered to evaluate for seizures.  CT chest, abdomen and pelvis ordered for evaluation of malignancy did not show any acute pathology. .  Delirium precautions.  No change in mental status.   Social problem Per report from ED staff, patient's primary caregiver is her husband who was hospitalized for the past few days.  Her daughter is also currently hospitalized and admitted to the ICU.  Patient was at home with her grand children who reportedly have substances of abuse related problems.  When granddaughter went to check on the patient today, she was aggravated, combative, eating her own feces and covered in feces, roaches, lice, and bedbugs.  IVC paperwork was done per request from granddaughter. -PT/OT and social work consulted for placement - palliative care consulted for goals of care.   Hypernatremia- (present on admission) Likely due to severe dehydration.  She has very dry mucous membranes and decreased  skin turgor.   Sodium wnl.   Acute renal failure superimposed on stage 3a chronic kidney disease (HCC) Likely prerenal azotemia from severe dehydration. BUN 41, creatinine 2.0 (baseline 1.0-1.3). -much improved with IV fluids.  - continue to monitor.   Essential hypertension- (present on admission) Well controlled.  Continue to monitor.  No changes in meds.   Type 2 diabetes mellitus with stage 3a chronic kidney disease, without long-term current use of insulin ([place])- (present on admission) A1c is 6.3%. Continue with SSI.  CBG (last 3)  Recent Labs    08/03/21 2355 08/04/21 0407 08/04/21 1219   GLUCAP 110* 110* 133*   Encourage intake.   Palliative care consulted and plan for long term care placement memory care unit placement with outpatient palliative services.        Estimated body mass index is 18.67 kg/m as calculated from the following:   Height as of this encounter: 5' 4.5" (1.638 m).   Weight as of this encounter: 50.1 kg.  Code Status: Full code.  DVT Prophylaxis:  SCDs Start: 07/31/21 2303   Level of Care: Level of care: Med-Surg Family Communication: None at bedside.   Disposition Plan:     Remains inpatient appropriate:  pending HSV PCR, IV anti virals  Procedures:  LP  Consultants:   Neurology.   Antimicrobials:   Anti-infectives (From admission, onward)    Start     Dose/Rate Route Frequency Ordered Stop   08/03/21 2300  vancomycin (VANCOREADY) IVPB 500 mg/100 mL  Status:  Discontinued        500 mg 100 mL/hr over 60 Minutes Intravenous Every 24 hours 08/02/21 2251 08/04/21 1408   08/03/21 0000  metroNIDAZOLE (FLAGYL) IVPB 500 mg  Status:  Discontinued        500 mg 100 mL/hr over 60 Minutes Intravenous Every 8 hours 08/02/21 2240 08/04/21 1408   08/03/21 0000  acyclovir (ZOVIRAX) 500 mg in dextrose 5 % 100 mL IVPB        10 mg/kg  50.1 kg 110 mL/hr over 60 Minutes Intravenous Every 12 hours 08/02/21 2253     08/02/21 2345  vancomycin (VANCOCIN) IVPB 1000 mg/200 mL premix        1,000 mg 200 mL/hr over 60 Minutes Intravenous  Once 08/02/21 2249 08/03/21 0229   08/02/21 2330  cefTRIAXone (ROCEPHIN) 2 g in sodium chloride 0.9 % 100 mL IVPB  Status:  Discontinued        2 g 200 mL/hr over 30 Minutes Intravenous Every 12 hours 08/02/21 2240 08/04/21 1408        Medications  Scheduled Meds:  mouth rinse  15 mL Mouth Rinse BID   Continuous Infusions:  sodium chloride 75 mL/hr at 08/06/21 1152   acyclovir (ZOVIRAX) </= 700 mg IVPB 500 mg (08/06/21 1153)   PRN Meds:.acetaminophen **OR** acetaminophen    Subjective:   Marie [name] was seen and examined today.  She continues to be confused.   Objective:   Vitals:   08/05/21 2002 08/06/21 0402 08/06/21 0754 08/06/21 1206  BP: (!) 142/70 133/77 (!) 158/78 108/60  Pulse: 82 77 74 75  Resp: 14 16 18 17   Temp: 97.6 F (36.4 C) 97.9 F (36.6 C) 97.7 F (36.5 C) 97.6 F (36.4 C)  TempSrc: Oral Axillary Oral Axillary  SpO2: 100% 100% 100% 100%  Weight:      Height:        Intake/Output Summary (Last 24 hours) at 08/06/2021 1636 Last data filed  at 08/06/2021 1600 Gross per 24 hour  Intake 3578.15 ml  Output 1 ml  Net 3577.15 ml    Filed Weights   07/31/21 1734 07/31/21 2300  Weight: 59 kg 50.1 kg    General exam: Appears calm and comfortable  Respiratory system: Clear to auscultation. Respiratory effort normal. Cardiovascular system: S1 & S2 heard, RRR. No JVD,  No pedal edema. Gastrointestinal system: Abdomen is nondistended, soft and nontender.  Normal bowel sounds heard. Central nervous system: Alert and oriented. No focal neurological deficits. Extremities: Symmetric 5 x 5 power. Skin: No rashes, lesions or ulcers Psychiatry:  Mood & affect appropriate.      Data Reviewed:  I have personally reviewed following labs and imaging studies   CBC Lab Results  Component Value Date   WBC 8.3 08/06/2021   RBC 4.14 08/06/2021   HGB 12.0 08/06/2021   HCT 36.4 08/06/2021   MCV 87.9 08/06/2021   MCH 29.0 08/06/2021   PLT 256 08/06/2021   MCHC 33.0 08/06/2021   RDW 13.4 08/06/2021   LYMPHSABS 0.7 08/06/2021   MONOABS 0.6 08/06/2021   EOSABS 1.0 (H) 08/06/2021   BASOSABS 0.1 35/36/1443     Last metabolic panel Lab Results  Component Value Date   NA 138 08/06/2021   K 3.6 08/06/2021   CL 107 08/06/2021   CO2 24 08/06/2021   BUN 7 (L) 08/06/2021   CREATININE 1.10 (H) 08/06/2021   GLUCOSE 112 (H) 08/06/2021   GFRNONAA 54 (L) 08/06/2021   GFRAA 62 09/02/2020   CALCIUM 8.4 (L) 08/06/2021   PROT 7.8 07/31/2021   ALBUMIN 2.9 (L)  08/03/2021   BILITOT 0.6 07/31/2021   ALKPHOS 75 07/31/2021   AST 33 07/31/2021   ALT 17 07/31/2021   ANIONGAP 7 08/06/2021    CBG (last 3)  Recent Labs    08/04/21 2116 08/04/21 2338 08/05/21 0659  GLUCAP 197* 165* 124*       Coagulation Profile: No results for input(s): INR, PROTIME in the last 168 hours.   Radiology Studies: CT CHEST ABDOMEN PELVIS W CONTRAST  Result Date: 08/04/2021 CLINICAL DATA:  Occult malignancy. Abnormal brain imaging to rule out primary tumor or malignancy. Confusion. EXAM: CT CHEST, ABDOMEN, AND PELVIS WITH CONTRAST TECHNIQUE: Multidetector CT imaging of the chest, abdomen and pelvis was performed following the standard protocol during bolus administration of intravenous contrast. RADIATION DOSE REDUCTION: This exam was performed according to the departmental dose-optimization program which includes automated exposure control, adjustment of the mA and/or kV according to patient size and/or use of iterative reconstruction technique. CONTRAST:  19mL OMNIPAQUE IOHEXOL 300 MG/ML  SOLN COMPARISON:  CT chest 11/04/2019 FINDINGS: CT CHEST FINDINGS Cardiovascular: Normal heart size. No pericardial effusions. Normal caliber thoracic aorta. No aortic dissection. Great vessel origins are patent. Calcification in the aorta and coronary arteries. Main pulmonary arteries are patent without evidence of large central pulmonary embolus. Venous gas is present, likely resulting from intravenous injections. Mediastinum/Nodes: Small esophageal hiatal hernia. Esophagus is decompressed. No significant lymphadenopathy. Lungs/Pleura: Subpleural scarring along the right anterior chest wall may represent radiation change. Mild dependent changes in the lung bases. No focal consolidation or edema. No pulmonary mass lesion. No pleural effusions. No pneumothorax. Musculoskeletal: Postoperative changes in the right humerus. Degenerative changes in the spine. No destructive bone lesions.  Anterior compression of T6 and T7 vertebrae, unchanged. CT ABDOMEN PELVIS FINDINGS Hepatobiliary: Cholelithiasis. No evidence of cholecystitis. No focal liver lesions. No bile duct dilatation. Pancreas: Unremarkable. No pancreatic ductal dilatation  or surrounding inflammatory changes. Spleen: Normal in size without focal abnormality. Adrenals/Urinary Tract: No adrenal gland nodules. Several cysts demonstrated on the right kidney, largest in the upper pole measuring 4.1 cm diameter. Appearance is consistent with typically benign lesion and unchanged since prior study. No follow-up is indicated. Nephrograms are symmetrical. No hydronephrosis or hydroureter. Bladder is unremarkable. Stomach/Bowel: Stomach, small bowel, and colon are not abnormally distended. Scattered stool throughout the colon. No wall thickening or inflammatory changes are appreciated. Appendix is not identified. Vascular/Lymphatic: Aortic atherosclerosis. No enlarged abdominal or pelvic lymph nodes. Reproductive: Uterus and bilateral adnexa are unremarkable. Other: No abdominal wall hernia or abnormality. No abdominopelvic ascites. Musculoskeletal: Degenerative changes in the spine. No destructive bone lesions. IMPRESSION: 1. No primary mass or metastatic disease demonstrated in the chest abdomen or pelvis. 2. Small esophageal hiatal hernia. 3. Cholelithiasis without evidence of cholecystitis. 4. Aortic atherosclerosis. 5. Old compression deformities of T6 and T7 vertebrae, unchanged. Electronically Signed   By: Lucienne Capers M.D.   On: 08/04/2021 18:05        Hosie Poisson M.D. [facility] 08/06/2021, 4:36 PM  Available via Epic secure chat 7am-7pm After 7 pm, please refer to night coverage provider listed on amion.

## 2021-08-06 NOTE — Consult Note (Signed)
Palliative Care Consult Note                                  Date: 08/06/2021   Patient Name: Marie [name]  DOB: [birth year]  MRN: [id number]  Age / Sex: 71 y.o., female  PCP: [name], MD Referring Physician: Hosie Poisson, MD  Reason for Consultation: Establishing goals of care  HPI/Patient Profile: 71 y.o. female  with past medical history of dementia with behavioral disturbance, depression, DM type 2 not on medications, HLD, HTN, hypothyroidism, mild nonobstructive CAD per cath done in May 2021, CKD stage IIIa, history of breast cancer presented to the ED via EMS and law enforcement under IVC. She was admitted on 07/31/2021 with Acute metabolic encephalopathy on chronic dementia, AKI on CKD 3a, and others.  Patient normally lives with her husband at home who manages her ADLs. Husband was admitted to the hospital for the last 4 days and she was alone at home. Per [employer], family is unreliable and has mental health issues of their own. Husband was discharged from the hospital today and granddaughter came home before he arrived to find the patient aggravated, combative, eating her own feces and covered in feces, roaches, lice, and bedbugs.   PMT was consulted for [place] discussions.  Past Medical History:  Diagnosis Date   Dementia ([place])    Depression    Diabetes mellitus    Hypertension    Thyroid disease    Type II or unspecified type diabetes mellitus without mention of complication, not stated as uncontrolled    stopped metformin due to diarrhea, no meds now   Vitamin D deficiency     Subjective:   This NP Walden Field reviewed medical records, received report from team, assessed the patient and then meet at the patient's bedside to discuss diagnosis, prognosis, GOC, EOL wishes disposition and options.  I met with the patient's husband [name], son [name], and daughter-in-law Crystal.  We met at the bedside but proceeded to a  conference room for family meeting.   Concept of Palliative Care was introduced as specialized medical care for people and their families living with serious illness.  If focuses on providing relief from the symptoms and stress of a serious illness.  The goal is to improve quality of life for both the patient and the family. Values and goals of care important to patient and family were attempted to be elicited.  Created space and opportunity for patient  and family to explore thoughts and feelings regarding current medical situation   Natural trajectory and current clinical status were discussed. Questions and concerns addressed. Patient  encouraged to call with questions or concerns.    Patient/Family Understanding of Illness: They understand that there is some concern for acute mental status changes but MRI and lumbar puncture were negative.  They note that she has advanced dementia with behavioral disturbances.  Currently she is very calm and sweet until somebody tries to clean her up or give her medications and then she becomes combative.  Family states that she appears to be at her baseline at this point.  We also discussed her acute kidney injury that is resolving/resolved after IV fluids.  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  After surgery she had sudden mental status changes and it has been "downhill from there".  She had to resign her job.  She enjoys cracker jacks, cookies, and other sweets.  She is very social person and enjoys the social eating experience.  Patient Values: Family, animals  Goals: To take care of the patient as best as possible which they understand to be likely need for facility admission.  Today's Discussion: We had an extensive discussion today with the patient's family.  We discussed her acute and chronic medical conditions as described above.  We spent time monitoring the patient's husband's attempts  to care for his wife.  Their daughter and her children moved in with the patient and her husband with the agreement that they would like after the patient while the husband was working.  However, they were not "for filling her end of the bargain."  Husband works a lot in order to be able to make money to support all the people in his home and pay the bills.  He is unable to adequately care for the patient despite his best efforts.  Additionally, the patient's daughter and grandchildren have substance abuse issues.  Patient's daughter is currently admitted to [facility] long hospital ICU for an overdose.  This all came to ahead when the patient's husband [name] was admitted to the hospital and the patient's care relied solely on her daughter and grandchildren.  At this point, after Michael's admission/discharge, is when the patient was found in her own filth with infestations.  We discussed how difficult it is to care for patient with end-stage dementia especially when they have significant behavioral disturbances and combativeness.  The patient's son and daughter-in-law understand that their father is doing the best he can but is just simply unable to provide the level of care necessary at this stage of her dementia journey.  We discussed the progressive insidious nature of dementia.  She is not appear to be hospice appropriate at this time and they are not wanting to engage hospice yet but they are agreeable to outpatient palliative care.  After long discussion of potential options they have elected to try to get the patient placed in a facility such as a long-term care nursing home or memory care where appropriate care can be provided for her and family can go back to being family rather than caregivers.  We all agree that this is the best outcome.  We also discussed CODE STATUS and they have elected DNR status.  I provided emotional general supportive therapeutic listening, therapeutic silence, empathy,  sharing of stories, monitoring efforts, and others.  I answered all questions and addressed all concerns to the best my ability.  I spoke with [facility] team and they will check into the patient's insurance status as the patient's husband [name] thinks that she may have Medicaid.  They are in agreement to work toward long-term care placement as that is what the patient needs.  Review of Systems  Unable to perform ROS: Dementia   Objective:   Primary Diagnoses: Present on Admission:  Acute metabolic encephalopathy  Hypernatremia  Type 2 diabetes mellitus with stage 3a chronic kidney disease, without long-term current use of insulin (HCC)  Essential hypertension   Physical Exam Vitals and nursing note reviewed.  Constitutional:      General: She is not in acute distress.    Appearance: She is ill-appearing. She is not toxic-appearing.  HENT:     Head: Normocephalic and atraumatic.  Cardiovascular:     Rate and Rhythm: Normal rate.  Pulmonary:     Effort: Pulmonary effort is normal. No respiratory distress.  Neurological:  Mental Status: She is alert. She is confused.  Psychiatric:        Behavior: Behavior normal.    Vital Signs:  BP 108/60 (BP Location: Right Arm)    Pulse 75    Temp 97.6 F (36.4 C) (Axillary)    Resp 17    Ht 5' 4.5" (1.638 m)    Wt 50.1 kg Comment: bed was zeroed immediately prior to pt arrival   SpO2 100%    BMI 18.67 kg/m   Palliative Assessment/Data: 50%    Advanced Care Planning:   Primary Decision Maker: NEXT OF KIN  Code Status/Advance Care Planning: DNR  A discussion was had today regarding advanced directives. Concepts specific to code status, artifical feeding and hydration, continued IV antibiotics and rehospitalization was had.  The difference between a aggressive medical intervention path and a palliative comfort care path for this patient at this time was had. The MOST form was introduced and discussed.  A MOST form was completed and  scanned into the EMR in the [facility]yn[facility] tab.  Decisions/Changes to ACP: Change to DNR MOST form selections per below   I completed a MOST form today. The patient and family outlined their wishes for the following treatment decisions:  Cardiopulmonary Resuscitation: Do Not Attempt Resuscitation (DNR/No CPR)  Medical Interventions: Limited Additional Interventions: Use medical treatment, IV fluids and cardiac monitoring as indicated, DO NOT USE intubation or mechanical ventilation. May consider use of less invasive airway support such as BiPAP or CPAP. Also provide comfort measures. Transfer to the hospital if indicated. Avoid intensive care.   Antibiotics: Determine use of limitation of antibiotics when infection occurs  IV Fluids: IV fluids for a defined trial period  Feeding Tube: No feeding tube     Assessment & Plan:   Impression: 71 year old female with acute on chronic mental status changes with underlying advanced dementia and behavioral disturbances making it difficult to provide adequate care in the home.  Complex social issues as described above.  At this point we feel the best thing to do for her is to try to secure long-term care placement in either nursing home or memory care unit.  The patient may have Medicaid, may be able to afford some out-of-pocket expense.  Social work will engage with the family to work through this.  She is also appropriate for outpatient palliative care follow-up at what ever facility she ends up.  SUMMARY OF RECOMMENDATIONS   Changed to DNR MOST form completed and entered into the EMR Continue to treat the treatable Work toward placement at a long-term care facility/memory care PMT will follow peripherally as goals are clear Please contact us if we can be of further assistance or if new needs are identified  Symptom Management:  Per primary team PMT is available to assist as needed  Prognosis:  Unable to determine  Discharge Planning:  Long-term  care with outpatient palliative care    Discussed with: Patient's family, medical team, nursing team, [facility] team    Thank you for allowing [place] to participate in the care of YANNELY KINTZEL PMT will continue to support holistically.  Time Total: 90 min  Greater than 50%  of this time was spent counseling and coordinating care related to the above assessment and plan.  Signed by: Walden Field, NP Palliative Medicine Team  Team Phone # [phone] (Nights/Weekends)  08/06/2021, 1:39 PM

## 2021-08-07 LAB — CBC WITH DIFFERENTIAL/PLATELET
Abs Immature Granulocytes: 0.03 10*3/uL (ref 0.00–0.07)
Basophils Absolute: 0.1 10*3/uL (ref 0.0–0.1)
Basophils Relative: 1 %
Eosinophils Absolute: 0.8 10*3/uL — ABNORMAL HIGH (ref 0.0–0.5)
Eosinophils Relative: 10 %
HCT: 35.7 % — ABNORMAL LOW (ref 36.0–46.0)
Hemoglobin: 11.9 g/dL — ABNORMAL LOW (ref 12.0–15.0)
Immature Granulocytes: 0 %
Lymphocytes Relative: 8 %
Lymphs Abs: 0.6 10*3/uL — ABNORMAL LOW (ref 0.7–4.0)
MCH: 29.3 pg (ref 26.0–34.0)
MCHC: 33.3 g/dL (ref 30.0–36.0)
MCV: 87.9 fL (ref 80.0–100.0)
Monocytes Absolute: 0.6 10*3/uL (ref 0.1–1.0)
Monocytes Relative: 8 %
Neutro Abs: 5.5 10*3/uL (ref 1.7–7.7)
Neutrophils Relative %: 73 %
Platelets: 249 10*3/uL (ref 150–400)
RBC: 4.06 MIL/uL (ref 3.87–5.11)
RDW: 13.4 % (ref 11.5–15.5)
WBC: 7.6 10*3/uL (ref 4.0–10.5)
nRBC: 0 % (ref 0.0–0.2)

## 2021-08-07 LAB — CSF CULTURE W GRAM STAIN: Culture: NO GROWTH

## 2021-08-07 LAB — BASIC METABOLIC PANEL
Anion gap: 8 (ref 5–15)
BUN: 9 mg/dL (ref 8–23)
CO2: 22 mmol/L (ref 22–32)
Calcium: 8.5 mg/dL — ABNORMAL LOW (ref 8.9–10.3)
Chloride: 107 mmol/L (ref 98–111)
Creatinine, Ser: 0.9 mg/dL (ref 0.44–1.00)
GFR, Estimated: >60 mL/min (ref >60)
Glucose, Bld: 145 mg/dL — ABNORMAL HIGH (ref 70–99)
Potassium: 3.7 mmol/L (ref 3.5–5.1)
Sodium: 137 mmol/L (ref 135–145)

## 2021-08-07 LAB — HSV 1/2 PCR, CSF
HSV-1 DNA: NEGATIVE
HSV-2 DNA: NEGATIVE

## 2021-08-07 NOTE — Progress Notes (Signed)
Subjective: No significant changes  Exam: Vitals:   08/07/21 0404 08/07/21 0700  BP: 133/85 102/76  Pulse: 70 96  Resp: 16 16  Temp: 97.7 F (36.5 C) (!) 97.5 F (36.4 C)  SpO2:     Gen: In bed, NAD Resp: non-labored breathing, no acute distress Abd: soft, nt  Neuro: MS: Awakens easily, she is able to follow commands, has some confused responses, e.g .  I asked her how many fingers I was holding up(5) and she asked the tech in the room, "Can we get 5 tickets?" While calling her the wrong name. She changed this to one ticket when I held up one finger.   CN: Pupils equal and round and reactive, she counts fingers on the left, but gives the response above when checking on the right.  Motor: She moves all extremities spontaneously and to command Sensory: Intact light touch  Pertinent Labs: CT chest abdomen pelvis-no primary discovered LP-CSF WBC 1, CSF RBC 4, protein of 53, glucose of 77  Impression: 71 year old female presenting with altered mental status who has had a progressive encephalopathy over the past 10 years.  Possibilities for her MRI appearance at this point include inflammatory process such as an autoimmune encephalitis, malignancy(such as gliomatosis cerebri), infection is much less likely with her CSF results.  I discussed options for working up her MRI findings with her husband. At this point, she has been having significant dementia symptoms for 10+ years(there is an MRI from 2012 with indication memory loss). It has been a steady decline over that time. MRI from 2014 without any changes like those we are seeing today. I think the liklihood of returning her to an independent level of function is very low, and we could contribute to behavioral issues and delirium with steroids. In that setting, I do not think we should pursue an empiric steroid trial and he agrees. He is adamant about not pursuing any options that would cause discomfort(e.g. brain biopsy).   In this  setting, I think that supportive care is the only option left at this point, and therefore no other options to pursue at this time.   Recommendations: 1) supportive care per IM/palliative care.  2) Neurology will be available as needed.    Roland Rack, MD [facility] [phone]  If 7pm- 7am, please page neurology on call as listed in [place].

## 2021-08-07 NOTE — Progress Notes (Signed)
[facility]                                                                               Marie [name], is a 71 y.o. female, DOB - 08-15-1950, FXT:[id number] Admit date - 07/31/2021    Outpatient Primary MD for the patient is [name], MD  LOS - 7  days    Brief summary   Marie [name] is a 71 y.o. female with medical history significant of dementia with behavioral disturbance, depression, DM type 2 not on medications, HLD, HTN, hypothyroidism, mild nonobstructive CAD per cath done in May 2021, CKD stage IIIa, history of breast cancer presented to the ED via EMS and law enforcement under IVC. Patient normally lives with her husband at home who manages her ADLs. Husband was admitted to the hospital for the last 4 days and she was alone at home. Per [employer], family is unreliable and has mental health issues of their own. Husband was discharged from the hospital today and granddaughter came home before he arrived to find the patient aggravated, combative, eating her own feces and covered in feces, roaches, lice, and bedbugs.  Granddaughter has taken out IVC papers.  Vital signs stable on arrival to the ED.  Patient was combative and was given Ativan and Haldol.  Labs fairly stable except for sodium 156, BUN 41, creatinine 2.0 (baseline 1.0-1.3), UA without signs of infection. Ethanol and UDS negative. CT head concerning for subarachnoid hemorrhage. Neurosurgery consulted and requested MRI and LP. MRI attempted after patient received Haldol, but patient still combative and noted motion degraded, recommending repeat MRI with sedation for further evaluation. Neurology attempted to contact patient for consent for LP, with no luck. Patient admitted for further management.  Assessment & Plan    Assessment and Plan: * Acute metabolic encephalopathy- (present on admission) Dementia with behavioral disturbance Likely due to metabolic derangements including severe  dehydration/AKI/hypernatremia in the setting of progressive cognitive decline over a period of 10 years.  No fever or leukocytosis.  UA without signs of infection.  UDS negative.  Blood ethanol level undetectable.   Pt likely has baseline dementia.  MRI of the brain concerning for autoimmune encephalitis, or malignancy and infection is less likely.  S/p LP done, bacterial meningitis ruled out.  EEG ordered to evaluate for seizures.  CT chest, abdomen and pelvis ordered for evaluation of malignancy did not show any acute pathology. .  Delirium precautions.  In view of persistent cognitive deficits, palliative care consulted and plan for long tern care facility placement.   Social problem Per report from ED staff, patient's primary caregiver is her husband who was hospitalized for the past few days.  Her daughter is also currently hospitalized and admitted to the ICU.  Patient was at home with her grand children who reportedly have substances of abuse related problems.  When granddaughter went to check on the patient today, she was aggravated, combative, eating her own feces and covered in feces, roaches, lice, and bedbugs.  IVC paperwork was done per request from granddaughter. -PT/OT and social work consulted for placement - palliative care consulted for goals of care. DNR and long term care facility  and with outpatient palliative follow up.   Hypernatremia- (present on admission) Likely due to severe dehydration.  She has very dry mucous membranes and decreased skin turgor.   Sodium wnl.   Acute renal failure superimposed on stage 3a chronic kidney disease (HCC) Likely prerenal azotemia from severe dehydration. BUN 41, creatinine 2.0 (baseline 1.0-1.3). -much improved with IV fluids.  - continue to monitor.   Essential hypertension- (present on admission) Well controlled.  Continue to monitor.  No changes in meds.   Type 2 diabetes mellitus with stage 3a chronic kidney disease, without  long-term current use of insulin ([place])- (present on admission) A1c is 6.3%. Continue with SSI.  CBG (last 3)  Recent Labs    08/03/21 2355 08/04/21 0407 08/04/21 1219  GLUCAP 110* 110* 133*   Encourage intake.   Palliative care consulted and plan for long term care placement memory care unit placement with outpatient palliative services.        Estimated body mass index is 18.67 kg/m as calculated from the following:   Height as of this encounter: 5' 4.5" (1.638 m).   Weight as of this encounter: 50.1 kg.  Code Status: Full code.  DVT Prophylaxis:  SCDs Start: 07/31/21 2303   Level of Care: Level of care: Med-Surg Family Communication: None at bedside.   Disposition Plan:     Remains inpatient appropriate:  pending HSV PCR, IV anti virals  Procedures:  LP  Consultants:   Neurology.  Palliative care  Antimicrobials:   Anti-infectives (From admission, onward)    Start     Dose/Rate Route Frequency Ordered Stop   08/03/21 2300  vancomycin (VANCOREADY) IVPB 500 mg/100 mL  Status:  Discontinued        500 mg 100 mL/hr over 60 Minutes Intravenous Every 24 hours 08/02/21 2251 08/04/21 1408   08/03/21 0000  metroNIDAZOLE (FLAGYL) IVPB 500 mg  Status:  Discontinued        500 mg 100 mL/hr over 60 Minutes Intravenous Every 8 hours 08/02/21 2240 08/04/21 1408   08/03/21 0000  acyclovir (ZOVIRAX) 500 mg in dextrose 5 % 100 mL IVPB        10 mg/kg  50.1 kg 110 mL/hr over 60 Minutes Intravenous Every 12 hours 08/02/21 2253     08/02/21 2345  vancomycin (VANCOCIN) IVPB 1000 mg/200 mL premix        1,000 mg 200 mL/hr over 60 Minutes Intravenous  Once 08/02/21 2249 08/03/21 0229   08/02/21 2330  cefTRIAXone (ROCEPHIN) 2 g in sodium chloride 0.9 % 100 mL IVPB  Status:  Discontinued        2 g 200 mL/hr over 30 Minutes Intravenous Every 12 hours 08/02/21 2240 08/04/21 1408        Medications  Scheduled Meds:  mouth rinse  15 mL Mouth Rinse BID   Continuous  Infusions:  sodium chloride 75 mL/hr at 08/07/21 1409   acyclovir (ZOVIRAX) </= 700 mg IVPB 500 mg (08/07/21 1416)   PRN Meds:.acetaminophen **OR** acetaminophen    Subjective:   Marie [name] was seen and examined today.  Appears comfortable.   Objective:   Vitals:   08/07/21 0404 08/07/21 0700 08/07/21 1300 08/07/21 1600  BP: 133/85 102/76 117/62 134/84  Pulse: 70 96 69 79  Resp: 16 16 16 17   Temp: 97.7 F (36.5 C) (!) 97.5 F (36.4 C) (!) 97.3 F (36.3 C) (!) 97.3 F (36.3 C)  TempSrc: Oral Axillary Axillary Axillary  SpO2:  96% 100% 100%  Weight:      Height:        Intake/Output Summary (Last 24 hours) at 08/07/2021 1652 Last data filed at 08/07/2021 1230 Gross per 24 hour  Intake 1742.35 ml  Output --  Net 1742.35 ml    Filed Weights   07/31/21 1734 07/31/21 2300  Weight: 59 kg 50.1 kg    General exam: Appears calm and comfortable  Respiratory system: Clear to auscultation. Respiratory effort normal. Cardiovascular system: S1 & S2 heard, RRR. No JVD,  No pedal edema. Gastrointestinal system: Abdomen is nondistended, soft and nontender. Normal bowel sounds heard. Central nervous system: Alert a, confused.  Extremities: Symmetric 5 x 5 power. Skin: No rashes, lesions or ulcers Psychiatry: confused, restless.      Data Reviewed:  I have personally reviewed following labs and imaging studies   CBC Lab Results  Component Value Date   WBC 7.6 08/07/2021   RBC 4.06 08/07/2021   HGB 11.9 (L) 08/07/2021   HCT 35.7 (L) 08/07/2021   MCV 87.9 08/07/2021   MCH 29.3 08/07/2021   PLT 249 08/07/2021   MCHC 33.3 08/07/2021   RDW 13.4 08/07/2021   LYMPHSABS 0.6 (L) 08/07/2021   MONOABS 0.6 08/07/2021   EOSABS 0.8 (H) 08/07/2021   BASOSABS 0.1 69/48/5462     Last metabolic panel Lab Results  Component Value Date   NA 137 08/07/2021   K 3.7 08/07/2021   CL 107 08/07/2021   CO2 22 08/07/2021   BUN 9 08/07/2021   CREATININE 0.90 08/07/2021    GLUCOSE 145 (H) 08/07/2021   GFRNONAA >60 08/07/2021   GFRAA 62 09/02/2020   CALCIUM 8.5 (L) 08/07/2021   PROT 7.8 07/31/2021   ALBUMIN 2.9 (L) 08/03/2021   BILITOT 0.6 07/31/2021   ALKPHOS 75 07/31/2021   AST 33 07/31/2021   ALT 17 07/31/2021   ANIONGAP 8 08/07/2021    CBG (last 3)  Recent Labs    08/04/21 2116 08/04/21 2338 08/05/21 0659  GLUCAP 197* 165* 124*       Coagulation Profile: No results for input(s): INR, PROTIME in the last 168 hours.   Radiology Studies: No results found.      Hosie Poisson M.D. [facility] 08/07/2021, 4:52 PM  Available via Epic secure chat 7am-7pm After 7 pm, please refer to night coverage provider listed on amion.

## 2021-08-08 LAB — CBC WITH DIFFERENTIAL/PLATELET
Abs Immature Granulocytes: 0.04 10*3/uL (ref 0.00–0.07)
Basophils Absolute: 0.1 10*3/uL (ref 0.0–0.1)
Basophils Relative: 1 %
Eosinophils Absolute: 0.7 10*3/uL — ABNORMAL HIGH (ref 0.0–0.5)
Eosinophils Relative: 7 %
HCT: 37.9 % (ref 36.0–46.0)
Hemoglobin: 12.6 g/dL (ref 12.0–15.0)
Immature Granulocytes: 0 %
Lymphocytes Relative: 7 %
Lymphs Abs: 0.7 10*3/uL (ref 0.7–4.0)
MCH: 28.8 pg (ref 26.0–34.0)
MCHC: 33.2 g/dL (ref 30.0–36.0)
MCV: 86.7 fL (ref 80.0–100.0)
Monocytes Absolute: 0.7 10*3/uL (ref 0.1–1.0)
Monocytes Relative: 8 %
Neutro Abs: 7.5 10*3/uL (ref 1.7–7.7)
Neutrophils Relative %: 77 %
Platelets: 286 10*3/uL (ref 150–400)
RBC: 4.37 MIL/uL (ref 3.87–5.11)
RDW: 13.6 % (ref 11.5–15.5)
WBC: 9.8 10*3/uL (ref 4.0–10.5)
nRBC: 0 % (ref 0.0–0.2)

## 2021-08-08 LAB — ANAEROBIC CULTURE W GRAM STAIN

## 2021-08-08 MED ORDER — HYDROXYZINE HCL 25 MG PO TABS
25.0000 mg | ORAL_TABLET | Freq: Three times a day (TID) | ORAL | Status: DC | PRN
  Administered 2021-08-08 – 2021-08-11 (×4): 25 mg via ORAL
  Filled 2021-08-08 (×6): qty 1

## 2021-08-08 NOTE — Plan of Care (Signed)
°  Problem: Clinical Measurements: Goal: Ability to maintain clinical measurements within normal limits will improve Outcome: Progressing Goal: Will remain free from infection Outcome: Progressing Goal: Diagnostic test results will improve Outcome: Progressing Goal: Respiratory complications will improve Outcome: Progressing Goal: Cardiovascular complication will be avoided Outcome: Progressing   Problem: Activity: Goal: Risk for activity intolerance will decrease Outcome: Progressing   Problem: Nutrition: Goal: Adequate nutrition will be maintained Outcome: Progressing   Problem: Elimination: Goal: Will not experience complications related to urinary retention Outcome: Progressing   Problem: Pain Managment: Goal: General experience of comfort will improve Outcome: Progressing   Problem: Safety: Goal: Ability to remain free from injury will improve Outcome: Progressing   Problem: Skin Integrity: Goal: Risk for impaired skin integrity will decrease Outcome: Progressing

## 2021-08-08 NOTE — Progress Notes (Signed)
[facility]                                                                               Darrel Baroni, is a 71 y.o. female, DOB - 11/20/1950, XVQ:[id number] Admit date - 07/31/2021    Outpatient Primary MD for the patient is [name], MD  LOS - 8  days    Brief summary   Marie [name] is a 71 y.o. female with medical history significant of dementia with behavioral disturbance, depression, DM type 2 not on medications, HLD, HTN, hypothyroidism, mild nonobstructive CAD per cath done in May 2021, CKD stage IIIa, history of breast cancer presented to the ED via EMS and law enforcement under IVC. Patient normally lives with her husband at home who manages her ADLs. Husband was admitted to the hospital for the last 4 days and she was alone at home. Per [employer], family is unreliable and has mental health issues of their own. Husband was discharged from the hospital today and granddaughter came home before he arrived to find the patient aggravated, combative, eating her own feces and covered in feces, roaches, lice, and bedbugs.  Granddaughter has taken out IVC papers.  Vital signs stable on arrival to the ED.  Patient was combative and was given Ativan and Haldol.  Labs fairly stable except for sodium 156, BUN 41, creatinine 2.0 (baseline 1.0-1.3), UA without signs of infection. Ethanol and UDS negative. CT head concerning for subarachnoid hemorrhage. Neurosurgery consulted and requested MRI and LP. MRI attempted after patient received Haldol, but patient still combative and noted motion degraded, recommending repeat MRI with sedation for further evaluation. Neurology attempted to contact patient for consent for LP, with no luck. Patient admitted for further management.  Assessment & Plan    Assessment and Plan: * Acute metabolic encephalopathy- (present on admission) Dementia with behavioral disturbance Likely due to metabolic derangements including severe  dehydration/AKI/hypernatremia in the setting of progressive cognitive decline over a period of 10 years.  No fever or leukocytosis.  UA without signs of infection.  UDS negative.  Blood ethanol level undetectable.   Pt likely has baseline dementia.  MRI of the brain concerning for autoimmune encephalitis, or malignancy and infection is less likely.  S/p LP done, bacterial meningitis ruled out.  EEG ordered to evaluate for seizures.  CT chest, abdomen and pelvis ordered for evaluation of malignancy , it showed . No primary mass or metastatic disease demonstrated in the chest abdomen or pelvis. Delirium precautions.  In view of persistent cognitive deficits, palliative care consulted and plan for long tern care facility placement.   Social problem Per report from ED staff, patient's primary caregiver is her husband who was hospitalized for the past few days.  Her daughter is also currently hospitalized and admitted to the ICU.  Patient was at home with her grand children who reportedly have substances of abuse related problems.  When granddaughter went to check on the patient today, she was aggravated, combative, eating her own feces and covered in feces, roaches, lice, and bedbugs.  IVC paperwork was done per request from granddaughter. -PT/OT and social work consulted for placement - palliative care consulted for  goals of care. DNR and long term care facility and with outpatient palliative follow up.   Hypernatremia- (present on admission) Likely due to severe dehydration.  She has very dry mucous membranes and decreased skin turgor.   Sodium wnl. Will d/c fluids and monitor.   Acute renal failure superimposed on stage 3a chronic kidney disease (HCC) Likely prerenal azotemia from severe dehydration. BUN 41, creatinine 2.0 (baseline 1.0-1.3). -much improved with IV fluids.  - continue to monitor. Creatinine back to baseline.   Essential hypertension- (present on admission) Well controlled.   Continue to monitor.  No changes in meds.   Type 2 diabetes mellitus with stage 3a chronic kidney disease, without long-term current use of insulin ([place])- (present on admission) A1c is 6.3%. Continue with SSI.  CBG (last 3)  Recent Labs    08/03/21 2355 08/04/21 0407 08/04/21 1219  GLUCAP 110* 110* 133*   Encourage intake.    Constant itching from lice:  Vistaril ordered.   Palliative care consulted and plan for long term care placement memory care unit placement with outpatient palliative services.        Estimated body mass index is 18.67 kg/m as calculated from the following:   Height as of this encounter: 5' 4.5" (1.638 m).   Weight as of this encounter: 50.1 kg.  Code Status: Full code.  DVT Prophylaxis:  SCDs Start: 07/31/21 2303   Level of Care: Level of care: Med-Surg Family Communication: None at bedside.   Disposition Plan:     Remains inpatient appropriate:  pending HSV PCR, IV anti virals  Procedures:  LP  Consultants:   Neurology.  Palliative care  Antimicrobials:   Anti-infectives (From admission, onward)    Start     Dose/Rate Route Frequency Ordered Stop   08/03/21 2300  vancomycin (VANCOREADY) IVPB 500 mg/100 mL  Status:  Discontinued        500 mg 100 mL/hr over 60 Minutes Intravenous Every 24 hours 08/02/21 2251 08/04/21 1408   08/03/21 0000  metroNIDAZOLE (FLAGYL) IVPB 500 mg  Status:  Discontinued        500 mg 100 mL/hr over 60 Minutes Intravenous Every 8 hours 08/02/21 2240 08/04/21 1408   08/03/21 0000  acyclovir (ZOVIRAX) 500 mg in dextrose 5 % 100 mL IVPB  Status:  Discontinued        10 mg/kg  50.1 kg 110 mL/hr over 60 Minutes Intravenous Every 12 hours 08/02/21 2253 08/08/21 1029   08/02/21 2345  vancomycin (VANCOCIN) IVPB 1000 mg/200 mL premix        1,000 mg 200 mL/hr over 60 Minutes Intravenous  Once 08/02/21 2249 08/03/21 0229   08/02/21 2330  cefTRIAXone (ROCEPHIN) 2 g in sodium chloride 0.9 % 100 mL IVPB  Status:   Discontinued        2 g 200 mL/hr over 30 Minutes Intravenous Every 12 hours 08/02/21 2240 08/04/21 1408        Medications  Scheduled Meds:  mouth rinse  15 mL Mouth Rinse BID   Continuous Infusions:   PRN Meds:.acetaminophen **OR** acetaminophen, hydrOXYzine    Subjective:   Kallan Bischoff was seen and examined today.  RN reports constant scratching and itching.   Objective:   Vitals:   08/07/21 1300 08/07/21 1600 08/07/21 2000 08/08/21 0537  BP: 117/62 134/84 121/70 139/85  Pulse: 69 79 88 92  Resp: 16 17 17 17   Temp: (!) 97.3 F (36.3 C) (!) 97.3 F (36.3 C) (!) 97.4 F (36.3  C) 98.4 F (36.9 C)  TempSrc: Axillary Axillary Axillary Oral  SpO2: 100% 100%  100%  Weight:      Height:        Intake/Output Summary (Last 24 hours) at 08/08/2021 1030 Last data filed at 08/08/2021 0807 Gross per 24 hour  Intake 2391.56 ml  Output --  Net 2391.56 ml    Filed Weights   07/31/21 1734 07/31/21 2300  Weight: 59 kg 50.1 kg    General exam: Appears calm and comfortable  Respiratory system: Clear to auscultation. Respiratory effort normal. Cardiovascular system: S1 & S2 heard, RRR. No JVD,  No pedal edema. Gastrointestinal system: Abdomen is soft, NT ND BS+ Central nervous system: Alert and comfortable. Confused.  Extremities: Symmetric 5 x 5 power. Skin: No rashes, lesions or ulcers Psychiatry: Mood & affect appropriate.        Data Reviewed:  I have personally reviewed following labs and imaging studies   CBC Lab Results  Component Value Date   WBC 9.8 08/08/2021   RBC 4.37 08/08/2021   HGB 12.6 08/08/2021   HCT 37.9 08/08/2021   MCV 86.7 08/08/2021   MCH 28.8 08/08/2021   PLT 286 08/08/2021   MCHC 33.2 08/08/2021   RDW 13.6 08/08/2021   LYMPHSABS 0.7 08/08/2021   MONOABS 0.7 08/08/2021   EOSABS 0.7 (H) 08/08/2021   BASOSABS 0.1 62/70/3500     Last metabolic panel Lab Results  Component Value Date   NA 137 08/07/2021   K 3.7  08/07/2021   CL 107 08/07/2021   CO2 22 08/07/2021   BUN 9 08/07/2021   CREATININE 0.90 08/07/2021   GLUCOSE 145 (H) 08/07/2021   GFRNONAA >60 08/07/2021   GFRAA 62 09/02/2020   CALCIUM 8.5 (L) 08/07/2021   PROT 7.8 07/31/2021   ALBUMIN 2.9 (L) 08/03/2021   BILITOT 0.6 07/31/2021   ALKPHOS 75 07/31/2021   AST 33 07/31/2021   ALT 17 07/31/2021   ANIONGAP 8 08/07/2021    CBG (last 3)  No results for input(s): GLUCAP in the last 72 hours.     Coagulation Profile: No results for input(s): INR, PROTIME in the last 168 hours.   Radiology Studies: No results found.      Hosie Poisson M.D. [facility] 08/08/2021, 10:30 AM  Available via Epic secure chat 7am-7pm After 7 pm, please refer to night coverage provider listed on amion.

## 2021-08-09 LAB — CBC WITH DIFFERENTIAL/PLATELET
Abs Immature Granulocytes: 0.03 10*3/uL (ref 0.00–0.07)
Basophils Absolute: 0.1 10*3/uL (ref 0.0–0.1)
Basophils Relative: 1 %
Eosinophils Absolute: 0.7 10*3/uL — ABNORMAL HIGH (ref 0.0–0.5)
Eosinophils Relative: 12 %
HCT: 35 % — ABNORMAL LOW (ref 36.0–46.0)
Hemoglobin: 11.5 g/dL — ABNORMAL LOW (ref 12.0–15.0)
Immature Granulocytes: 1 %
Lymphocytes Relative: 14 %
Lymphs Abs: 0.8 10*3/uL (ref 0.7–4.0)
MCH: 28.8 pg (ref 26.0–34.0)
MCHC: 32.9 g/dL (ref 30.0–36.0)
MCV: 87.7 fL (ref 80.0–100.0)
Monocytes Absolute: 0.8 10*3/uL (ref 0.1–1.0)
Monocytes Relative: 13 %
Neutro Abs: 3.4 10*3/uL (ref 1.7–7.7)
Neutrophils Relative %: 59 %
Platelets: 236 10*3/uL (ref 150–400)
RBC: 3.99 MIL/uL (ref 3.87–5.11)
RDW: 13.8 % (ref 11.5–15.5)
WBC: 5.7 10*3/uL (ref 4.0–10.5)
nRBC: 0 % (ref 0.0–0.2)

## 2021-08-09 MED ORDER — HALOPERIDOL LACTATE 5 MG/ML IJ SOLN: 5.0000 mg | Freq: Once | INTRAMUSCULAR | Status: AC

## 2021-08-09 MED ORDER — HYDROCORTISONE 1 % EX CREA
TOPICAL_CREAM | Freq: Four times a day (QID) | CUTANEOUS | Status: DC
  Administered 2021-08-12: 1 via TOPICAL
  Filled 2021-08-09: qty 28

## 2021-08-09 MED ORDER — HALOPERIDOL LACTATE 5 MG/ML IJ SOLN
INTRAMUSCULAR | Status: AC
  Administered 2021-08-09: 5 mg via INTRAMUSCULAR
  Filled 2021-08-09: qty 1

## 2021-08-09 NOTE — Progress Notes (Signed)
Cross-coverage note:   Pt becoming aggressive, hitting staff, could not be redirected. Plan to give Haldol, may need restraints for safety of pt and staff if fails to respond to pharmacologic intervention.

## 2021-08-09 NOTE — Progress Notes (Signed)
Received consult for IV access. Pt currently does not have an IV medications or procedures ordered that indicate need for an IV. Secure chat sent to RN to confirm this information. Awaiting response.

## 2021-08-09 NOTE — Progress Notes (Signed)
Physical Therapy Treatment Patient Details Name: Marie [name] MRN: [id number] DOB: Oct 05, 1950 Today's Date: 08/09/2021   History of Present Illness Marie [name] is a 71 y.o. female who presented to the ED via EMS and law enforcement under IVC.  Pt was found by granddaughter covered in bed bugs, lice, roaches, and feces in which pt was eating it. Pt lives with spouse however spouse was admitted to the hospital for the last 4 days and she was home alone. [id number] with behavioral disturbance, depression, type II diabetes not on medications, hyperlipidemia, hypertension, hypothyroidism, mild nonobstructive CAD per cath done in May 2021, vitamin D deficiency, aortic atherosclerosis, CKD stage IIIa, history of breast cancer    PT Comments    Pt with significant improvement in her mobility however continues to demo visual hallucinations and significant hallucinations. Pt unaware of situation or place, only oriented to self. Pt saying "I love you" to PT one minute then becoming agitated and cussing another minute. Pt with no safety awareness. When given wash cloth to wash face pt unable to complete task despite max verbal cues. Pt did ambulate t/o room with close min guard and appreciated being up. Pt remains unsafe and unable to care for herself. Acute PT to cont to recommend SNF upon d/c.    Recommendations for follow up therapy are one component of a multi-disciplinary discharge planning process, led by the attending physician.  Recommendations may be updated based on patient status, additional functional criteria and insurance authorization.  Follow Up Recommendations  Skilled nursing-short term rehab (<3 hours/day) (with transition to memory care)     Assistance Recommended at Discharge Frequent or constant Supervision/Assistance  Patient can return home with the following A lot of help with walking and/or transfers;A lot of help with bathing/dressing/bathroom;Assistance with  cooking/housework;Direct supervision/assist for medications management;Direct supervision/assist for financial management;Assist for transportation;Help with stairs or ramp for entrance   Equipment Recommendations   (TBD at next venue)    Recommendations for Other Services       Precautions / Restrictions Precautions Precautions: Fall;Other (comment) Precaution Comments: pt with observed bed bugs and lice infesting pt's hair and body, pt very confused with visual hallucinations Restrictions Weight Bearing Restrictions: No     Mobility  Bed Mobility Overal bed mobility: Needs Assistance Bed Mobility: Supine to Sit, Sit to Supine     Supine to sit: Min guard Sit to supine: Mod assist, +2 for physical assistance   General bed mobility comments: pt eager to get up OOB and only required min guard for safety due to first time getting up. pt however very reluctant to return to bed requiring modAx2 for safe transfer back to bed    Transfers Overall transfer level: Needs assistance Equipment used: 1 person hand held assist Transfers: Sit to/from Stand Sit to Stand: Min assist           General transfer comment: Min physical assist to stand, max assist for safety and multimodal cuing.    Ambulation/Gait Ambulation/Gait assistance: Min assist Gait Distance (Feet): 100 Feet Assistive device: 1 person hand held assist, None (close min guard) Gait Pattern/deviations: Step-through pattern Gait velocity: wfl Gait velocity interpretation: 1.31 - 2.62 ft/sec, indicative of limited community ambulator   General Gait Details: pt ambulating wtih steady gait pattern, walking all around room with close min guard. Pt with no episodes of LOB, due to pt with active lice in hair unable to take pt into hallway   Stairs  Wheelchair Mobility    Modified Rankin (Stroke Patients Only) Modified Rankin (Stroke Patients Only) Pre-Morbid Rankin Score: Moderately severe  disability Modified Rankin: Moderately severe disability     Balance Overall balance assessment: Needs assistance Sitting-balance support: Feet supported, No upper extremity supported Sitting balance-Leahy Scale: Fair Sitting balance - Comments: pt able to maintain EOB balance with close min guard with no UE support   Standing balance support: Single extremity supported, No upper extremity supported, During functional activity Standing balance-Leahy Scale: Fair Standing balance comment: pt walking around room eating applesauce with her finger and unopening and opening pill crusher                            Cognition Arousal/Alertness: Awake/alert Behavior During Therapy: WFL for tasks assessed/performed Overall Cognitive Status: History of cognitive impairments - at baseline                                 General Comments: Pt talking to peope who aren't present, trying to pick up objects that aren't there. Pt using medicine cup to try to drink from. Pt very sweet saying "I love you so much" then pt cussing at PT and sitter or to invisible people.        Exercises      General Comments General comments (skin integrity, edema, etc.): VSS, pt soiled dependent for care      Pertinent Vitals/Pain Pain Assessment Pain Assessment: No/denies pain    Home Living                          Prior Function            PT Goals (current goals can now be found in the care plan section) Acute Rehab PT Goals Patient Stated Goal: didn't state PT Goal Formulation: Patient unable to participate in goal setting Time For Goal Achievement: 08/15/21 Potential to Achieve Goals: Fair Progress towards PT goals: Progressing toward goals    Frequency    Min 2X/week      PT Plan Current plan remains appropriate    Co-evaluation              AM-PAC PT "6 Clicks" Mobility   Outcome Measure  Help needed turning from your back to your side  while in a flat bed without using bedrails?: A Little Help needed moving from lying on your back to sitting on the side of a flat bed without using bedrails?: A Little Help needed moving to and from a bed to a chair (including a wheelchair)?: A Lot Help needed standing up from a chair using your arms (e.g., wheelchair or bedside chair)?: A Lot Help needed to walk in hospital room?: A Little Help needed climbing 3-5 steps with a railing? : A Lot 6 Click Score: 15    End of Session   Activity Tolerance: Patient tolerated treatment well Patient left: in bed;with call bell/phone within reach;with bed alarm set;with nursing/sitter in room Nurse Communication: Mobility status PT Visit Diagnosis: Unsteadiness on feet (R26.81);Muscle weakness (generalized) (M62.81);Difficulty in walking, not elsewhere classified (R26.2);Adult, failure to thrive (R62.7)     Time: 8469-6295 PT Time Calculation (min) (ACUTE ONLY): 28 min  Charges:  $Gait Training: 8-22 mins $Therapeutic Activity: 8-22 mins  Kittie Plater, PT, DPT Acute Rehabilitation Services Pager #: [phone] Office #: [phone]    Berline Lopes 08/09/2021, 2:26 PM

## 2021-08-09 NOTE — Progress Notes (Signed)
This RN went into patient's room as patient was trying to get up.  Pt's bed wet.  This RN attempted to redirect patient and was pushed away by patient.  NT Luis in room and this RN made charge aware of situation and unsafe assignment.  This RN was able to obtain medication from MD and [place], NT to sit in room until patient able to go to sleep/deemed to not be a danger to herself or staff.  Unable to obtain vitals on patient due to agitation and pt refusal.

## 2021-08-09 NOTE — Progress Notes (Signed)
[facility]                                                                               Marie [name], is a 71 y.o. female, DOB - December 15, 1950, GUY:[id number] Admit date - 07/31/2021    Outpatient Primary MD for the patient is [name], MD  LOS - 9  days    Brief summary   Marie [name] is a 71 y.o. female with medical history significant of dementia with behavioral disturbance, depression, DM type 2 not on medications, HLD, HTN, hypothyroidism, mild nonobstructive CAD per cath done in May 2021, CKD stage IIIa, history of breast cancer presented to the ED via EMS and law enforcement under IVC. Patient normally lives with her husband at home who manages her ADLs. Husband was admitted to the hospital for the last 4 days and she was alone at home. Per [employer], family is unreliable and has mental health issues of their own. Husband was discharged from the hospital today and granddaughter came home before he arrived to find the patient aggravated, combative, eating her own feces and covered in feces, roaches, lice, and bedbugs.  Granddaughter has taken out IVC papers.  Vital signs stable on arrival to the ED.  Patient was combative and was given Ativan and Haldol.  Labs fairly stable except for sodium 156, BUN 41, creatinine 2.0 (baseline 1.0-1.3), UA without signs of infection. Ethanol and UDS negative. CT head concerning for subarachnoid hemorrhage. Neurosurgery consulted and requested MRI and LP. MRI attempted after patient received Haldol, but patient still combative and noted motion degraded, recommending repeat MRI with sedation for further evaluation. Neurology attempted to contact patient for consent for LP, with no luck. Patient admitted for further management. Pt seen and examined at bedside. No new complaints.   Assessment & Plan    Assessment and Plan: * Acute metabolic encephalopathy- (present on admission) Dementia with behavioral disturbance Likely due to  metabolic derangements including severe dehydration/AKI/hypernatremia in the setting of progressive cognitive decline over a period of 10 years.  No fever or leukocytosis.  UA without signs of infection.  UDS negative.  Blood ethanol level undetectable.   Pt likely has baseline dementia.  MRI of the brain concerning for autoimmune encephalitis, or malignancy and infection is less likely.  S/p LP done, bacterial meningitis ruled out.  EEG ordered to evaluate for seizures.  CT chest, abdomen and pelvis ordered for evaluation of malignancy , it showed . No primary mass or metastatic disease demonstrated in the chest abdomen or pelvis. Delirium precautions.  In view of persistent cognitive deficits, palliative care consulted and plan for long tern care facility placement.  Family refused brain biopsy.   Social problem Per report from ED staff, patient's primary caregiver is her husband who was hospitalized for the past few days.  Her daughter is also currently hospitalized and admitted to the ICU.  Patient was at home with her grand children who reportedly have substances of abuse related problems.  When granddaughter went to check on the patient today, she was aggravated, combative, eating her own feces and covered in feces, roaches, lice, and bedbugs.  IVC paperwork was done per  request from granddaughter. -PT/OT and social work consulted for placement - palliative care consulted for goals of care. DNR and long term care facility and with outpatient palliative follow up.   Hypernatremia- (present on admission) Likely due to severe dehydration.  She has very dry mucous membranes and decreased skin turgor.   Sodium wnl. Will d/c fluids and monitor.  Encourage oral intake.   Acute renal failure superimposed on stage 3a chronic kidney disease (HCC) Likely prerenal azotemia from severe dehydration. BUN 41, creatinine 2.0 (baseline 1.0-1.3). -much improved with IV fluids.  - continue to monitor.  Creatinine back to baseline.   Essential hypertension- (present on admission) Optimally controlled. Continue to monitor.  No changes in meds.   Type 2 diabetes mellitus with stage 3a chronic kidney disease, without long-term current use of insulin ([place])- (present on admission) A1c is 6.3%. Continue with SSI.  CBG (last 3)  Recent Labs    08/03/21 2355 08/04/21 0407 08/04/21 1219  GLUCAP 110* 110* 133*   Encourage intake. No changes in meds.    Constant itching from lice:  Vistaril ordered. Hydrocortisone cream ordered.   Palliative care consulted and plan for long term care placement memory care unit placement with outpatient palliative services.        Estimated body mass index is 18.67 kg/m as calculated from the following:   Height as of this encounter: 5' 4.5" (1.638 m).   Weight as of this encounter: 50.1 kg.  Code Status: Full code.  DVT Prophylaxis:  SCDs Start: 07/31/21 2303   Level of Care: Level of care: Med-Surg Family Communication: None at bedside.   Disposition Plan:     Remains inpatient appropriate:  unsafe d/c plan. Pt stable to d/c to long term memory unit.   Procedures:  LP  Consultants:   Neurology.  Palliative care  Antimicrobials:   Anti-infectives (From admission, onward)    Start     Dose/Rate Route Frequency Ordered Stop   08/03/21 2300  vancomycin (VANCOREADY) IVPB 500 mg/100 mL  Status:  Discontinued        500 mg 100 mL/hr over 60 Minutes Intravenous Every 24 hours 08/02/21 2251 08/04/21 1408   08/03/21 0000  metroNIDAZOLE (FLAGYL) IVPB 500 mg  Status:  Discontinued        500 mg 100 mL/hr over 60 Minutes Intravenous Every 8 hours 08/02/21 2240 08/04/21 1408   08/03/21 0000  acyclovir (ZOVIRAX) 500 mg in dextrose 5 % 100 mL IVPB  Status:  Discontinued        10 mg/kg  50.1 kg 110 mL/hr over 60 Minutes Intravenous Every 12 hours 08/02/21 2253 08/08/21 1029   08/02/21 2345  vancomycin (VANCOCIN) IVPB 1000 mg/200 mL premix         1,000 mg 200 mL/hr over 60 Minutes Intravenous  Once 08/02/21 2249 08/03/21 0229   08/02/21 2330  cefTRIAXone (ROCEPHIN) 2 g in sodium chloride 0.9 % 100 mL IVPB  Status:  Discontinued        2 g 200 mL/hr over 30 Minutes Intravenous Every 12 hours 08/02/21 2240 08/04/21 1408        Medications  Scheduled Meds:  mouth rinse  15 mL Mouth Rinse BID   Continuous Infusions:   PRN Meds:.acetaminophen **OR** acetaminophen, hydrOXYzine    Subjective:   Taylynn Easton was seen and examined today.  Confused, and scratching.   Objective:   Vitals:   08/08/21 2300 08/09/21 0235 08/09/21 0700 08/09/21 1143  BP: (!) 148/76 106/84 Marland Kitchen)  156/77 (!) 145/81  Pulse: 96 96 88 86  Resp: 19 19 18 18   Temp: 98.3 F (36.8 C) 98.3 F (36.8 C) 98.7 F (37.1 C) 98.1 F (36.7 C)  TempSrc: Oral Oral Oral Axillary  SpO2: 100% 100% 100% 100%  Weight:      Height:        Intake/Output Summary (Last 24 hours) at 08/09/2021 1249 Last data filed at 08/08/2021 2130 Gross per 24 hour  Intake 660 ml  Output --  Net 660 ml    Filed Weights   07/31/21 1734 07/31/21 2300  Weight: 59 kg 50.1 kg    General exam: ill disheveled woman not in distress.  Respiratory system: Clear to auscultation. Respiratory effort normal. Cardiovascular system: S1 & S2 heard, RRR. No JVD,  No pedal edema. Gastrointestinal system: Abdomen is nondistended, soft and nontender.Normal bowel sounds heard. Central nervous system: Alert and oriented. No focal neurological deficits. Extremities: Symmetric 5 x 5 power. Skin: No rashes, lesions or ulcers Psychiatry: Mood & affect appropriate.         Data Reviewed:  I have personally reviewed following labs and imaging studies   CBC Lab Results  Component Value Date   WBC 5.7 08/09/2021   RBC 3.99 08/09/2021   HGB 11.5 (L) 08/09/2021   HCT 35.0 (L) 08/09/2021   MCV 87.7 08/09/2021   MCH 28.8 08/09/2021   PLT 236 08/09/2021   MCHC 32.9 08/09/2021   RDW  13.8 08/09/2021   LYMPHSABS 0.8 08/09/2021   MONOABS 0.8 08/09/2021   EOSABS 0.7 (H) 08/09/2021   BASOSABS 0.1 48/18/5631     Last metabolic panel Lab Results  Component Value Date   NA 137 08/07/2021   K 3.7 08/07/2021   CL 107 08/07/2021   CO2 22 08/07/2021   BUN 9 08/07/2021   CREATININE 0.90 08/07/2021   GLUCOSE 145 (H) 08/07/2021   GFRNONAA >60 08/07/2021   GFRAA 62 09/02/2020   CALCIUM 8.5 (L) 08/07/2021   PROT 7.8 07/31/2021   ALBUMIN 2.9 (L) 08/03/2021   BILITOT 0.6 07/31/2021   ALKPHOS 75 07/31/2021   AST 33 07/31/2021   ALT 17 07/31/2021   ANIONGAP 8 08/07/2021    CBG (last 3)  No results for input(s): GLUCAP in the last 72 hours.     Coagulation Profile: No results for input(s): INR, PROTIME in the last 168 hours.   Radiology Studies: No results found.      Hosie Poisson M.D. [facility] 08/09/2021, 12:49 PM  Available via Epic secure chat 7am-7pm After 7 pm, please refer to night coverage provider listed on amion.

## 2021-08-10 LAB — CBC WITH DIFFERENTIAL/PLATELET
Abs Immature Granulocytes: 0.02 10*3/uL (ref 0.00–0.07)
Basophils Absolute: 0.1 10*3/uL (ref 0.0–0.1)
Basophils Relative: 1 %
Eosinophils Absolute: 0.5 10*3/uL (ref 0.0–0.5)
Eosinophils Relative: 7 %
HCT: 39.1 % (ref 36.0–46.0)
Hemoglobin: 12.6 g/dL (ref 12.0–15.0)
Immature Granulocytes: 0 %
Lymphocytes Relative: 9 %
Lymphs Abs: 0.6 10*3/uL — ABNORMAL LOW (ref 0.7–4.0)
MCH: 28.4 pg (ref 26.0–34.0)
MCHC: 32.2 g/dL (ref 30.0–36.0)
MCV: 88.1 fL (ref 80.0–100.0)
Monocytes Absolute: 0.7 10*3/uL (ref 0.1–1.0)
Monocytes Relative: 11 %
Neutro Abs: 4.7 10*3/uL (ref 1.7–7.7)
Neutrophils Relative %: 72 %
Platelets: 253 10*3/uL (ref 150–400)
RBC: 4.44 MIL/uL (ref 3.87–5.11)
RDW: 14 % (ref 11.5–15.5)
WBC: 6.5 10*3/uL (ref 4.0–10.5)
nRBC: 0 % (ref 0.0–0.2)

## 2021-08-10 MED ORDER — OLANZAPINE 2.5 MG PO TABS
2.5000 mg | ORAL_TABLET | Freq: Every day | ORAL | Status: DC
  Administered 2021-08-10 – 2021-08-11 (×2): 2.5 mg via ORAL
  Filled 2021-08-10 (×2): qty 1

## 2021-08-10 MED ORDER — PERMETHRIN 1 % EX LOTN
TOPICAL_LOTION | Freq: Once | CUTANEOUS | Status: AC
  Filled 2021-08-10: qty 59

## 2021-08-10 NOTE — Progress Notes (Signed)
OT Cancellation Note  Patient Details Name: Marie [name] MRN: [id number] DOB: Dec 19, 1950   Cancelled Treatment:    Reason Eval/Treat Not Completed: Fatigue/lethargy limiting ability to participate;Other (comment) pt asleep upon arrival, unable arouse despite max multimodal cues. Will f/u as time allows for OT session.   Harley Alto., COTA/L [facility] [phone]   [name] 08/10/2021, 2:43 PM

## 2021-08-10 NOTE — Progress Notes (Signed)
[facility]                                                                               Nickola Lenig, is a 71 y.o. female, DOB - 1951-01-14, GUY:[id number] Admit date - 07/31/2021    Outpatient Primary MD for the patient is [name], MD  LOS - 10  days    Brief summary   Marie [name] is a 71 y.o. female with medical history significant of dementia with behavioral disturbance, depression, DM type 2 not on medications, HLD, HTN, hypothyroidism, mild nonobstructive CAD per cath done in May 2021, CKD stage IIIa, history of breast cancer presented to the ED via EMS and law enforcement under IVC. Patient normally lives with her husband at home who manages her ADLs. Husband was admitted to the hospital for the last 4 days and she was alone at home. Per [employer], family is unreliable and has mental health issues of their own. Husband was discharged from the hospital today and granddaughter came home before he arrived to find the patient aggravated, combative, eating her own feces and covered in feces, roaches, lice, and bedbugs.  Granddaughter has taken out IVC papers.  Vital signs stable on arrival to the ED.  Patient was combative and was given Ativan and Haldol.  Labs fairly stable except for sodium 156, BUN 41, creatinine 2.0 (baseline 1.0-1.3), UA without signs of infection. Ethanol and UDS negative. CT head concerning for subarachnoid hemorrhage. Neurosurgery consulted and requested MRI and LP. MRI attempted after patient received Haldol, but patient still combative and noted motion degraded, recommending repeat MRI with sedation for further evaluation. Neurology attempted to contact patient for consent for LP, with no luck. Patient admitted for further management.  Assessment & Plan    Assessment and Plan: * Acute metabolic encephalopathy- (present on admission) Dementia with behavioral disturbance Likely due to metabolic derangements including severe  dehydration/AKI/hypernatremia in the setting of progressive cognitive decline over a period of 10 years.  No fever or leukocytosis.  UA without signs of infection.  UDS negative.  Blood ethanol level undetectable.   Pt likely has baseline dementia.  MRI of the brain concerning for autoimmune encephalitis, or malignancy and infection is less likely.  S/p LP done, bacterial meningitis ruled out.  EEG ordered to evaluate for seizures.  CT chest, abdomen and pelvis ordered for evaluation of malignancy , it showed . No primary mass or metastatic disease demonstrated in the chest abdomen or pelvis. Delirium precautions.  In view of persistent cognitive deficits, palliative care consulted and plan for long tern care facility placement.  Family refused brain biopsy.   Social problem Per report from ED staff, patient's primary caregiver is her husband who was hospitalized for the past few days.  Her daughter is also currently hospitalized and admitted to the ICU.  Patient was at home with her grand children who reportedly have substances of abuse related problems.  When granddaughter went to check on the patient today, she was aggravated, combative, eating her own feces and covered in feces, roaches, lice, and bedbugs.  IVC paperwork was done per request from granddaughter. -PT/OT and social work consulted for placement -  palliative care consulted for goals of care. DNR and long term care facility and with outpatient palliative follow up.   Hypernatremia- (present on admission) Likely due to severe dehydration.  She has very dry mucous membranes and decreased skin turgor.   Sodium wnl. Will d/c fluids and monitor.  Encourage oral intake.   Acute renal failure superimposed on stage 3a chronic kidney disease (HCC) Likely prerenal azotemia from severe dehydration. BUN 41, creatinine 2.0 (baseline 1.0-1.3). -much improved with IV fluids.  - continue to monitor. Creatinine back to baseline.   Essential  hypertension- (present on admission) Well controlled.   Type 2 diabetes mellitus with stage 3a chronic kidney disease, without long-term current use of insulin ([place])- (present on admission) A1c is 6.3%. Continue with SSI.  CBG (last 3)  Recent Labs    08/03/21 2355 08/04/21 0407 08/04/21 1219  GLUCAP 110* 110* 133*   Encourage intake. No changes in meds.    Constant itching from lice:  Vistaril ordered. Hydrocortisone cream ordered.  Another treatment with permethrin today.   Palliative care consulted and plan for long term care placement memory care unit placement with outpatient palliative services.        Estimated body mass index is 18.67 kg/m as calculated from the following:   Height as of this encounter: 5' 4.5" (1.638 m).   Weight as of this encounter: 50.1 kg.  Code Status: Full code.  DVT Prophylaxis:  SCDs Start: 07/31/21 2303   Level of Care: Level of care: Med-Surg Family Communication: None at bedside.   Disposition Plan:     Remains inpatient appropriate:  unsafe d/c plan. Pt stable to d/c to long term memory unit.   Procedures:  LP  Consultants:   Neurology.  Palliative care  Antimicrobials:   Anti-infectives (From admission, onward)    Start     Dose/Rate Route Frequency Ordered Stop   08/03/21 2300  vancomycin (VANCOREADY) IVPB 500 mg/100 mL  Status:  Discontinued        500 mg 100 mL/hr over 60 Minutes Intravenous Every 24 hours 08/02/21 2251 08/04/21 1408   08/03/21 0000  metroNIDAZOLE (FLAGYL) IVPB 500 mg  Status:  Discontinued        500 mg 100 mL/hr over 60 Minutes Intravenous Every 8 hours 08/02/21 2240 08/04/21 1408   08/03/21 0000  acyclovir (ZOVIRAX) 500 mg in dextrose 5 % 100 mL IVPB  Status:  Discontinued        10 mg/kg  50.1 kg 110 mL/hr over 60 Minutes Intravenous Every 12 hours 08/02/21 2253 08/08/21 1029   08/02/21 2345  vancomycin (VANCOCIN) IVPB 1000 mg/200 mL premix        1,000 mg 200 mL/hr over 60 Minutes  Intravenous  Once 08/02/21 2249 08/03/21 0229   08/02/21 2330  cefTRIAXone (ROCEPHIN) 2 g in sodium chloride 0.9 % 100 mL IVPB  Status:  Discontinued        2 g 200 mL/hr over 30 Minutes Intravenous Every 12 hours 08/02/21 2240 08/04/21 1408        Medications  Scheduled Meds:  hydrocortisone cream   Topical QID   mouth rinse  15 mL Mouth Rinse BID   permethrin   Topical Once   Continuous Infusions:   PRN Meds:.acetaminophen **OR** acetaminophen, hydrOXYzine    Subjective:   Marie [name] was seen and examined today.  Sleeping comfortably.   Objective:   Vitals:   08/09/21 2310 08/10/21 0329 08/10/21 0819 08/10/21 1253  BP: 133/78 136/60 Marland Kitchen)  126/52 (!) (P) 107/53  Pulse: 78 81 77 (P) 84  Resp: 18 18 19  (P) 18  Temp: 97.6 F (36.4 C) 97.8 F (36.6 C)  (P) 97.7 F (36.5 C)  TempSrc: Oral Oral Axillary (P) Axillary  SpO2: 100% 99% 100% (P) 99%  Weight:      Height:        Intake/Output Summary (Last 24 hours) at 08/10/2021 1357 Last data filed at 08/10/2021 0900 Gross per 24 hour  Intake 200 ml  Output --  Net 200 ml    Filed Weights   07/31/21 1734 07/31/21 2300  Weight: 59 kg 50.1 kg    General exam: ill appearing woman.  Respiratory system: Clear to auscultation. Respiratory effort normal. Cardiovascular system: S1 & S2 heard, RRR. No JVD, . No pedal edema. Gastrointestinal system: Abdomen is nondistended, soft and nontender.  Normal bowel sounds heard. Central nervous system: sleeping comfortably,  Extremities: Symmetric 5 x 5 power. Skin: No rashes, lesions or ulcers Psychiatry: cannot be assessed.        Data Reviewed:  I have personally reviewed following labs and imaging studies   CBC Lab Results  Component Value Date   WBC 6.5 08/10/2021   RBC 4.44 08/10/2021   HGB 12.6 08/10/2021   HCT 39.1 08/10/2021   MCV 88.1 08/10/2021   MCH 28.4 08/10/2021   PLT 253 08/10/2021   MCHC 32.2 08/10/2021   RDW 14.0 08/10/2021   LYMPHSABS 0.6  (L) 08/10/2021   MONOABS 0.7 08/10/2021   EOSABS 0.5 08/10/2021   BASOSABS 0.1 24/40/1027     Last metabolic panel Lab Results  Component Value Date   NA 137 08/07/2021   K 3.7 08/07/2021   CL 107 08/07/2021   CO2 22 08/07/2021   BUN 9 08/07/2021   CREATININE 0.90 08/07/2021   GLUCOSE 145 (H) 08/07/2021   GFRNONAA >60 08/07/2021   GFRAA 62 09/02/2020   CALCIUM 8.5 (L) 08/07/2021   PROT 7.8 07/31/2021   ALBUMIN 2.9 (L) 08/03/2021   BILITOT 0.6 07/31/2021   ALKPHOS 75 07/31/2021   AST 33 07/31/2021   ALT 17 07/31/2021   ANIONGAP 8 08/07/2021    CBG (last 3)  No results for input(s): GLUCAP in the last 72 hours.     Coagulation Profile: No results for input(s): INR, PROTIME in the last 168 hours.   Radiology Studies: No results found.      Hosie Poisson M.D. [facility] 08/10/2021, 1:57 PM  Available via Epic secure chat 7am-7pm After 7 pm, please refer to night coverage provider listed on amion.

## 2021-08-11 LAB — CBC WITH DIFFERENTIAL/PLATELET
Abs Immature Granulocytes: 0.02 10*3/uL (ref 0.00–0.07)
Basophils Absolute: 0.1 10*3/uL (ref 0.0–0.1)
Basophils Relative: 2 %
Eosinophils Absolute: 0.6 10*3/uL — ABNORMAL HIGH (ref 0.0–0.5)
Eosinophils Relative: 11 %
HCT: 36.1 % (ref 36.0–46.0)
Hemoglobin: 12 g/dL (ref 12.0–15.0)
Immature Granulocytes: 0 %
Lymphocytes Relative: 14 %
Lymphs Abs: 0.8 10*3/uL (ref 0.7–4.0)
MCH: 28.8 pg (ref 26.0–34.0)
MCHC: 33.2 g/dL (ref 30.0–36.0)
MCV: 86.6 fL (ref 80.0–100.0)
Monocytes Absolute: 0.6 10*3/uL (ref 0.1–1.0)
Monocytes Relative: 11 %
Neutro Abs: 3.3 10*3/uL (ref 1.7–7.7)
Neutrophils Relative %: 62 %
Platelets: 264 10*3/uL (ref 150–400)
RBC: 4.17 MIL/uL (ref 3.87–5.11)
RDW: 14 % (ref 11.5–15.5)
WBC: 5.4 10*3/uL (ref 4.0–10.5)
nRBC: 0 % (ref 0.0–0.2)

## 2021-08-11 MED ORDER — HALOPERIDOL LACTATE 5 MG/ML IJ SOLN
2.0000 mg | Freq: Four times a day (QID) | INTRAMUSCULAR | Status: DC | PRN
  Filled 2021-08-11: qty 1

## 2021-08-11 MED ORDER — HALOPERIDOL LACTATE 5 MG/ML IJ SOLN: 2.0000 mg | Freq: Four times a day (QID) | INTRAMUSCULAR | Status: DC | PRN

## 2021-08-11 NOTE — Progress Notes (Signed)
Marie [name]  HFW:[id number] DOB: 1950/12/21 DOA: 07/31/2021 PCP: [name], MD    Brief Narrative:  [phone] with a history of dementia, depression, DM 2, HLD, HTN, hypothyroidism, CKD stage IIIa, and breast cancer who presented to the ED via law enforcement under IVC.  She had been home alone after her husband required hospitalization 4 days prior.  The patient had not been receiving her medicines due to her husband's absence, and when he returned home he found her aggravated combative and consuming her own feces.  In the ED UA was without significant findings.  Alcohol and drug screens were negative.  CT head raised concern for subarachnoid hemorrhage.  MRI was attempted but was initially unable to be completed due to the patient's combativeness. Ultimately MRI imaging raised concern of hemorrhage v/s encephalitis.   Consultants:  Neurology Neurosurgery  Code Status: NO CODE BLUE  DVT prophylaxis: SCDs  Interim Hx: Afebrile.  Vital signs stable. Resting comfortably. Confused. Unable to provide a hx.   Assessment & Plan:  Acute metabolic encephalopathy on chronic dementia with behavioral disturbances Felt to likely be due to severe metabolic derangements -MRI worrisome for autoimmune encephalitis versus malignancy -LP ruled out bacterial meningitis -CT chest abdomen and pelvis without evidence of occult malignancy -given 10+ year history of progressive severe dementia extensive evaluation was not felt to be in the patient's best interest  Hypernatremia Reflective of severe dehydration -corrected with simple volume expansion  Acute renal failure on stage IIIa CKD Corrected with volume expansion -creatinine now normal  HTN No indication for strict control in this clinical setting -blood pressure currently well controlled  DM2 CBG stable -no indication for strict control in this clinical setting  Pruritus -lice infestation Being treated with permethrin  Goals of  care Focus is to be on improving quality of life and minimizing testing/discomfort   Family Communication: No family present at time of visit today Disposition: From home - therapy is recommending SNF for rehab stay then transition to memory care unit  Objective: Blood pressure 123/90, pulse 100, temperature 97.6 F (36.4 C), temperature source Axillary, resp. rate 18, height 5' 4.5" (1.638 m), weight 50.1 kg, SpO2 100 %.  Intake/Output Summary (Last 24 hours) at 08/11/2021 1114 Last data filed at 08/11/2021 0410 Gross per 24 hour  Intake 560 ml  Output 800 ml  Net -240 ml   Filed Weights   07/31/21 1734 07/31/21 2300  Weight: 59 kg 50.1 kg    Examination: General: No acute respiratory distress Lungs: Clear to auscultation bilaterally without wheezes or crackles Cardiovascular: Regular rate and rhythm without murmur gallop or rub normal S1 and S2 Abdomen: Nontender, nondistended, soft, bowel sounds positive, no rebound, no ascites, no appreciable mass Extremities: No significant cyanosis, clubbing, or edema bilateral lower extremities  CBC: Recent Labs  Lab 08/09/21 0634 08/10/21 0429 08/11/21 0411  WBC 5.7 6.5 5.4  NEUTROABS 3.4 4.7 3.3  HGB 11.5* 12.6 12.0  HCT 35.0* 39.1 36.1  MCV 87.7 88.1 86.6  PLT 236 253 412   Basic Metabolic Panel: Recent Labs  Lab 08/05/21 0112 08/06/21 0113 08/07/21 0156  NA 137 138 137  K 3.4* 3.6 3.7  CL 103 107 107  CO2 24 24 22   GLUCOSE 118* 112* 145*  BUN 10 7* 9  CREATININE 1.07* 1.10* 0.90  CALCIUM 8.4* 8.4* 8.5*   GFR: Estimated Creatinine Clearance: 46.7 mL/min (by C-G formula based on SCr of 0.9 mg/dL).  HbA1C: Hgb A1c MFr Bld  Date/Time Value Ref Range Status  07/31/2021 11:56 PM 6.3 (H) 4.8 - 5.6 % Final    Comment:    (NOTE) Pre diabetes:          5.7%-6.4%  Diabetes:              >6.4%  Glycemic control for   <7.0% adults with diabetes   01/21/2020 02:45 PM 6.2 (H) <5.7 % of total Hgb Final    Comment:     For someone without known diabetes, a hemoglobin  A1c value between 5.7% and 6.4% is consistent with prediabetes and should be confirmed with a  follow-up test. . For someone with known diabetes, a value <7% indicates that their diabetes is well controlled. A1c targets should be individualized based on duration of diabetes, age, comorbid conditions, and other considerations. . This assay result is consistent with an increased risk of diabetes. . Currently, no consensus exists regarding use of hemoglobin A1c for diagnosis of diabetes for children. .     CBG: Recent Labs  Lab 08/04/21 1219 08/04/21 2116 08/04/21 2338 08/05/21 0659  GLUCAP 133* 197* 165* 124*    Recent Results (from the past 240 hour(s))  CSF culture w Gram Stain     Status: None   Collection Time: 08/03/21  4:32 PM   Specimen: PATH Cytology CSF; Cerebrospinal Fluid  Result Value Ref Range Status   Specimen Description CSF  Final   Special Requests NONE  Final   Gram Stain   Final    WBC PRESENT,BOTH PMN AND MONONUCLEAR NO ORGANISMS SEEN CYTOSPIN SMEAR    Culture   Final    NO GROWTH 3 DAYS Performed at [facility], [address], [place], [place] [address]    Report Status 08/07/2021 FINAL  Final  Fungus Culture With Stain     Status: None (Preliminary result)   Collection Time: 08/03/21  4:32 PM   Specimen: PATH Cytology CSF; Cerebrospinal Fluid  Result Value Ref Range Status   Fungus Stain Final report  Final    Comment: (NOTE) Performed At: [facility] [place], [place] [id number] Rush Farmer MD KP:[id number]    Fungus (Mycology) Culture PENDING  Incomplete   Fungal Source CSF  Final    Comment: Performed at [facility], [place] [address] [place], [place] [address]  Anaerobic culture w Gram Stain     Status: None   Collection Time: 08/03/21  4:32 PM   Specimen: PATH Cytology CSF; Cerebrospinal Fluid  Result Value Ref Range Status   Specimen  Description CSF  Final   Special Requests NONE  Final   Culture   Final    NO ANAEROBES ISOLATED Performed at [facility], [place] [address] [place], [place] [address]    Report Status 08/08/2021 FINAL  Final  Fungus Culture Result     Status: None   Collection Time: 08/03/21  4:32 PM  Result Value Ref Range Status   Result 1 Comment  Final    Comment: (NOTE) KOH/Calcofluor preparation:  no fungus observed. Performed At: [facility] [place], [place] [id number] Rush Farmer MD IT:[id number]      Scheduled Meds:  hydrocortisone cream   Topical QID   mouth rinse  15 mL Mouth Rinse BID   OLANZapine  2.5 mg Oral QHS     LOS: 11 days   Cherene Altes, MD [facility] Office  [phone] Pager - Text Page per Shea Evans  If 7PM-7AM, please contact night-coverage per Amion 08/11/2021, 11:14 AM

## 2021-08-11 NOTE — Progress Notes (Signed)
Occupational Therapy Treatment Patient Details Name: Marie [name] MRN: [id number] DOB: [birth year] Today's Date: 08/11/2021   History of present illness Marie [name] is a 71 y.o. female who presented to the ED via EMS and law enforcement under IVC.  Pt was found by granddaughter covered in bed bugs, lice, roaches, and feces in which pt was eating it. Pt lives with spouse however spouse was admitted to the hospital for the last 4 days and she was home alone. IRS:[id number] with behavioral disturbance, depression, type II diabetes not on medications, hyperlipidemia, hypertension, hypothyroidism, mild nonobstructive CAD per cath done in May 2021, vitamin D deficiency, aortic atherosclerosis, CKD stage IIIa, history of breast cancer   OT comments  Pt making incremental progress physically, however cognitively, pt continues to have difficulties following commands. Overall physically, pt is requiring min guard-min A for functional mobility physically, however due to difficulty following commands, pt requiring max multimodal cuing for cognition. OT will continue to follow acutely.    Recommendations for follow up therapy are one component of a multi-disciplinary discharge planning process, led by the attending physician.  Recommendations may be updated based on patient status, additional functional criteria and insurance authorization.    Follow Up Recommendations  Skilled nursing-short term rehab (<3 hours/day)    Assistance Recommended at Discharge Frequent or constant Supervision/Assistance  Patient can return home with the following  A lot of help with walking and/or transfers;A lot of help with bathing/dressing/bathroom;Assistance with cooking/housework;Direct supervision/assist for medications management;Direct supervision/assist for financial management;Assistance with feeding   Equipment Recommendations  None recommended by OT    Recommendations for Other Services      Precautions /  Restrictions Precautions Precautions: Fall;Other (comment) Precaution Comments: Contact; pt very confused with visual hallucinations, has had lice and bed bugs Restrictions Weight Bearing Restrictions: No       Mobility Bed Mobility Overal bed mobility: Needs Assistance       Supine to sit: Min guard, Max assist Sit to supine: Min guard, Max assist   General bed mobility comments: Physically pt able to complete with no physical assist, however requiring max multimodal cuing to initiate, sequence, and follow commands.    Transfers Overall transfer level: Needs assistance Equipment used: Rolling walker (2 wheels), 1 person hand held assist Transfers: Sit to/from Stand Sit to Stand: Min guard, Max assist           General transfer comment: Min G to stand physically, max to follow commands     Balance Overall balance assessment: Needs assistance Sitting-balance support: Feet supported, No upper extremity supported Sitting balance-Leahy Scale: Fair Sitting balance - Comments: pt able to maintain EOB balance with close min guard with no UE support   Standing balance support: Single extremity supported, No upper extremity supported, During functional activity Standing balance-Leahy Scale: Fair Standing balance comment: pt fair standing balance at sink but with gait tasks needing at least +1 UE support due to confusion and unsafe environment (pt throwing soiled wet washcloths and other items on floor)                           ADL either performed or assessed with clinical judgement   ADL Overall ADL's : Needs assistance/impaired Eating/Feeding: Set up;Sitting Eating/Feeding Details (indicate cue type and reason): Finishing dinner during session Grooming: Wash/dry hands;Wash/dry face;Set up;Sitting Grooming Details (indicate cue type and reason): provided wash cloth  Toilet Transfer: Maximal assistance;Ambulation Toilet Transfer Details  (indicate cue type and reason): Max A due to difficulty following commands to sit down on toilet, requiring tactile cuing with pressure applide to hips         Functional mobility during ADLs: Minimal assistance;Maximal assistance;Rolling walker (2 wheels) General ADL Comments: Pt overall requiring min A for mobility for safety physically, however due to cognition and difficulty following commands, pt requring max A multimodal cuing.    Extremity/Trunk Assessment              Vision       Perception     Praxis      Cognition Arousal/Alertness: Awake/alert Behavior During Therapy: Flat affect, Impulsive Overall Cognitive Status: History of cognitive impairments - at baseline                                 General Comments: Pt continuing with visual hallucinations, Oriented x1-2, following approximately 25% of commands        Exercises      Shoulder Instructions       General Comments VSS on RA, NT in room as sitter    Pertinent Vitals/ Pain       Pain Assessment Pain Assessment: No/denies pain  Home Living                                          Prior Functioning/Environment              Frequency  Min 2X/week        Progress Toward Goals  OT Goals(current goals can now be found in the care plan section)  Progress towards OT goals: Progressing toward goals  Acute Rehab OT Goals Patient Stated Goal: None stated OT Goal Formulation: Patient unable to participate in goal setting Time For Goal Achievement: 08/15/21 Potential to Achieve Goals: Fair ADL Goals Additional ADL Goal #1: Pt will follow 1 step commands 50% of the time during session. Additional ADL Goal #2: Pt will attend to tasks presented to her for 3+ mins.  Plan Discharge plan remains appropriate;Frequency remains appropriate    Co-evaluation                 AM-PAC OT "6 Clicks" Daily Activity     Outcome Measure   Help from another  person eating meals?: A Little Help from another person taking care of personal grooming?: Total Help from another person toileting, which includes using toliet, bedpan, or urinal?: A Lot Help from another person bathing (including washing, rinsing, drying)?: Total Help from another person to put on and taking off regular upper body clothing?: Total Help from another person to put on and taking off regular lower body clothing?: Total 6 Click Score: 9    End of Session Equipment Utilized During Treatment: Gait belt;Rolling walker (2 wheels)  OT Visit Diagnosis: Unsteadiness on feet (R26.81);Other abnormalities of gait and mobility (R26.89);Muscle weakness (generalized) (M62.81);Other symptoms and signs involving cognitive function   Activity Tolerance Patient tolerated treatment well   Patient Left in bed;with call bell/phone within reach;with bed alarm set;with nursing/sitter in room   Nurse Communication Mobility status        Time: 8841-6606 OT Time Calculation (min): 16 min  Charges: OT General Charges $OT Visit: 1 Visit OT Treatments $Therapeutic Activity:  8-22 mins  Risa Auman H., OTR/L Acute Rehabilitation  Norwood Quezada Elane Yolanda Bonine 08/11/2021, 6:39 PM

## 2021-08-11 NOTE — Progress Notes (Signed)
Physical Therapy Treatment Patient Details Name: Marie [name] MRN: [id number] DOB: 29-Aug-1950 Today's Date: 08/11/2021   History of Present Illness Marie [name] is a 71 y.o. female who presented to the ED via EMS and law enforcement under IVC.  Pt was found by granddaughter covered in bed bugs, lice, roaches, and feces in which pt was eating it. Pt lives with spouse however spouse was admitted to the hospital for the last 4 days and she was home alone. OIN:OMVEHMCN with behavioral disturbance, depression, type II diabetes not on medications, hyperlipidemia, hypertension, hypothyroidism, mild nonobstructive CAD per cath done in May 2021, vitamin D deficiency, aortic atherosclerosis, CKD stage IIIa, history of breast cancer    PT Comments    Pt received up in room being assisted to bathroom by sitter, nsg staff requesting urgent assistance, PTA in room to assist for pt/staff safety. Pt with fair seated/standing balance but with high fall risk due to confusion and AMS/behaviors. Pt needing up to modA +2 for stand>sit safety due to pt poor command following and minA +2 for safety with standing/gait and balance tasks at sink. Pt standing up to 20 mins at a time prior to seated rest break. RN notified of pt increased agitation/behaviors during peri-care and clean-up. Pt following <10% of cues for assisting with hygiene tasks. Pt continues to benefit from PT services to progress toward functional mobility goals.    Recommendations for follow up therapy are one component of a multi-disciplinary discharge planning process, led by the attending physician.  Recommendations may be updated based on patient status, additional functional criteria and insurance authorization.  Follow Up Recommendations  Skilled nursing-short term rehab (<3 hours/day) (with transition to memory care)     Assistance Recommended at Discharge Frequent or constant Supervision/Assistance  Patient can return home with the  following A lot of help with walking and/or transfers;A lot of help with bathing/dressing/bathroom;Assistance with cooking/housework;Direct supervision/assist for medications management;Direct supervision/assist for financial management;Assist for transportation;Help with stairs or ramp for entrance   Equipment Recommendations   (TBD at next venue)    Recommendations for Other Services       Precautions / Restrictions Precautions Precautions: Fall;Other (comment) Precaution Comments: Contact; pt very confused with visual hallucinations, has had lice and bed bugs Restrictions Weight Bearing Restrictions: No     Mobility  Bed Mobility Overal bed mobility: Needs Assistance             General bed mobility comments: pt received up in room and seated EOB as therapist leaving room.    Transfers Overall transfer level: Needs assistance Equipment used: 1 person hand held assist, 2 person hand held assist, None Transfers: Sit to/from Stand Sit to Stand: Min guard, +2 safety/equipment, Mod assist           General transfer comment: min guard for safety to stand, up to modA for stand>sit due to pt poor command following and confusion, but needing to sit on toilet so needing some manual assist for B knee bending    Ambulation/Gait Ambulation/Gait assistance: Min assist, +2 safety/equipment, Min guard Gait Distance (Feet): 30 Feet Assistive device: 1 person hand held assist, 2 person hand held assist (close min guard) Gait Pattern/deviations: Step-through pattern, Drifts right/left       General Gait Details: pt needing more assist due to confusion and impulsivity, LOB due to pt combativeness at times, +1-2 UE support for safety, at times min guard only when pt more calm.   Stairs  Wheelchair Mobility    Modified Rankin (Stroke Patients Only) Modified Rankin (Stroke Patients Only) Pre-Morbid Rankin Score: Moderately severe disability Modified Rankin:  Moderately severe disability     Balance Overall balance assessment: Needs assistance Sitting-balance support: Feet supported, No upper extremity supported Sitting balance-Leahy Scale: Fair Sitting balance - Comments: pt able to maintain EOB balance with close min guard with no UE support   Standing balance support: Single extremity supported, No upper extremity supported, During functional activity Standing balance-Leahy Scale: Fair Standing balance comment: pt fair standing balance at sink but with gait tasks needing at least +1 UE support due to confusion and unsafe environment (pt throwing soiled wet washcloths and other items on floor)            Cognition Arousal/Alertness: Awake/alert Behavior During Therapy: Restless, Agitated, Flat affect, Impulsive Overall Cognitive Status: History of cognitive impairments - at baseline             General Comments: Pt not oriented, confused, agitated when PTA entered room to assist sitter to safely mobilize pt, pt using slur words and cursing at staff, follows <10% of 1-step instructions, pt more calm toward end of session but difficult to redirect and impulsive to stand/walk around room, sitter and NT present for safety as PTA leaving room        Exercises      General Comments General comments (skin integrity, edema, etc.): UTA vitals due to pt need for consistent physical assist, pt denies dizziness when prompted      Pertinent Vitals/Pain Pain Assessment Pain Assessment: No/denies pain Pain Intervention(s): Monitored during session, Repositioned     PT Goals (current goals can now be found in the care plan section) Acute Rehab PT Goals Patient Stated Goal: didn't state PT Goal Formulation: Patient unable to participate in goal setting Time For Goal Achievement: 08/15/21 Progress towards PT goals: Progressing toward goals (AMS/behaviors limiting comprehension)    Frequency    Min 2X/week      PT Plan Current  plan remains appropriate       AM-PAC PT "6 Clicks" Mobility   Outcome Measure  Help needed turning from your back to your side while in a flat bed without using bedrails?: A Little Help needed moving from lying on your back to sitting on the side of a flat bed without using bedrails?: A Little Help needed moving to and from a bed to a chair (including a wheelchair)?: A Lot Help needed standing up from a chair using your arms (e.g., wheelchair or bedside chair)?: A Lot Help needed to walk in hospital room?: A Lot Help needed climbing 3-5 steps with a railing? : A Lot (mod cues) 6 Click Score: 14    End of Session Equipment Utilized During Treatment: Gait belt Activity Tolerance: Treatment limited secondary to agitation Patient left: with nursing/sitter in room;with call bell/phone within reach (pt sitting EOB and x2 nsg staff in room) Nurse Communication: Mobility status;Precautions;Other (comment) (pt agitation and using slurs at times, not following commands well) PT Visit Diagnosis: Unsteadiness on feet (R26.81);Muscle weakness (generalized) (M62.81);Difficulty in walking, not elsewhere classified (R26.2);Adult, failure to thrive (R62.7)     Time: 7989-2119 PT Time Calculation (min) (ACUTE ONLY): 30 min  Charges:  $Gait Training: 8-22 mins $Therapeutic Activity: 8-22 mins                     Javanna Patin P., PTA Acute Rehabilitation Services Pager: [phone] Office: [place]  Timisha Mondry 08/11/2021, 3:04 PM

## 2021-08-12 ENCOUNTER — Other Ambulatory Visit: Payer: Self-pay

## 2021-08-12 LAB — RESP PANEL BY RT-PCR (FLU A&B, COVID) ARPGX2
Influenza A by PCR: NEGATIVE
Influenza B by PCR: NEGATIVE
SARS Coronavirus 2 by RT PCR: NEGATIVE

## 2021-08-12 MED ORDER — HYDROCORTISONE 1 % EX CREA
TOPICAL_CREAM | Freq: Four times a day (QID) | CUTANEOUS | Status: DC | PRN
  Filled 2021-08-12: qty 28

## 2021-08-12 MED ORDER — QUETIAPINE FUMARATE 50 MG PO TABS
50.0000 mg | ORAL_TABLET | Freq: Every day | ORAL | Status: DC
  Administered 2021-08-12: 50 mg via ORAL
  Filled 2021-08-12: qty 1

## 2021-08-12 NOTE — Progress Notes (Signed)
Marie [name]  DJT:[id number] DOB: Aug 04, 1950 DOA: 07/31/2021 PCP: [name], MD    Brief Narrative:  [phone] with a history of dementia, depression, DM 2, HLD, HTN, hypothyroidism, CKD stage IIIa, and breast cancer who presented to the ED via law enforcement under IVC.  She had been home alone after her husband required hospitalization 4 days prior.  The patient had not been receiving her medicines due to her husband's absence, and when he returned home he found her aggravated combative and consuming her own feces.  In the ED UA was without significant findings.  Alcohol and drug screens were negative.  CT head raised concern for subarachnoid hemorrhage.  MRI was attempted but was initially unable to be completed due to the patient's combativeness. Ultimately MRI imaging raised concern of hemorrhage v/s encephalitis.   Consultants:  Neurology Neurosurgery  Code Status: NO CODE BLUE  DVT prophylaxis: SCDs  Interim Hx: Afebrile.  Vital signs stable.  Saturation 99% on room air. Appears comfortable in bed. Is non-communicative/sedate. No respiratory distress or evidence of uncontrolled pain.   Assessment & Plan:  Acute metabolic encephalopathy on chronic dementia with behavioral disturbances Felt to likely be due to severe metabolic derangements -MRI worrisome for autoimmune encephalitis versus malignancy -LP ruled out bacterial meningitis -CT chest abdomen and pelvis without evidence of occult malignancy -given 10+ year history of progressive severe dementia extensive evaluation was not felt to be in the patient's best interest - placement is currently being sought   Hypernatremia Reflective of severe dehydration -corrected with simple volume expansion  Acute renal failure on stage IIIa CKD Corrected with volume expansion -creatinine now normal  HTN No indication for strict control in this clinical setting -blood pressure currently well controlled  DM2 CBG stable -no indication  for strict control in this clinical setting  Pruritus -lice infestation Has been treated with permethrin  Goals of care Focus is to be on improving quality of life and minimizing testing/discomfort   Family Communication: No family present at time of visit today Disposition: From home - therapy is recommending SNF for rehab stay then transition to memory care unit  Objective: Blood pressure 132/73, pulse 86, temperature 97.6 F (36.4 C), temperature source Axillary, resp. rate 19, height 5' 4.5" (1.638 m), weight 50.1 kg, SpO2 99 %.  Intake/Output Summary (Last 24 hours) at 08/12/2021 1122 Last data filed at 08/11/2021 2147 Gross per 24 hour  Intake --  Output 0 ml  Net 0 ml    Filed Weights   07/31/21 1734 07/31/21 2300  Weight: 59 kg 50.1 kg    Examination: General: No acute respiratory distress Lungs: Clear to auscultation bilaterally  Cardiovascular: RRR Abdomen: NT/ND, soft, bs+, no mass  Extremities: No significant edema bilateral lower extremities  CBC: Recent Labs  Lab 08/09/21 0634 08/10/21 0429 08/11/21 0411  WBC 5.7 6.5 5.4  NEUTROABS 3.4 4.7 3.3  HGB 11.5* 12.6 12.0  HCT 35.0* 39.1 36.1  MCV 87.7 88.1 86.6  PLT 236 253 233    Basic Metabolic Panel: Recent Labs  Lab 08/06/21 0113 08/07/21 0156  NA 138 137  K 3.6 3.7  CL 107 107  CO2 24 22  GLUCOSE 112* 145*  BUN 7* 9  CREATININE 1.10* 0.90  CALCIUM 8.4* 8.5*    GFR: Estimated Creatinine Clearance: 46.7 mL/min (by C-G formula based on SCr of 0.9 mg/dL).  HbA1C: Hgb A1c MFr Bld  Date/Time Value Ref Range Status  07/31/2021 11:56 PM 6.3 (H) 4.8 -  5.6 % Final    Comment:    (NOTE) Pre diabetes:          5.7%-6.4%  Diabetes:              >6.4%  Glycemic control for   <7.0% adults with diabetes   01/21/2020 02:45 PM 6.2 (H) <5.7 % of total Hgb Final    Comment:    For someone without known diabetes, a hemoglobin  A1c value between 5.7% and 6.4% is consistent with prediabetes and  should be confirmed with a  follow-up test. . For someone with known diabetes, a value <7% indicates that their diabetes is well controlled. A1c targets should be individualized based on duration of diabetes, age, comorbid conditions, and other considerations. . This assay result is consistent with an increased risk of diabetes. . Currently, no consensus exists regarding use of hemoglobin A1c for diagnosis of diabetes for children. .      Recent Results (from the past 240 hour(s))  CSF culture w Gram Stain     Status: None   Collection Time: 08/03/21  4:32 PM   Specimen: PATH Cytology CSF; Cerebrospinal Fluid  Result Value Ref Range Status   Specimen Description CSF  Final   Special Requests NONE  Final   Gram Stain   Final    WBC PRESENT,BOTH PMN AND MONONUCLEAR NO ORGANISMS SEEN CYTOSPIN SMEAR    Culture   Final    NO GROWTH 3 DAYS Performed at [facility], [address], [place], [place] [address]    Report Status 08/07/2021 FINAL  Final  Fungus Culture With Stain     Status: None (Preliminary result)   Collection Time: 08/03/21  4:32 PM   Specimen: PATH Cytology CSF; Cerebrospinal Fluid  Result Value Ref Range Status   Fungus Stain Final report  Final    Comment: (NOTE) Performed At: [facility] [place], [place] [id number] Rush Farmer MD QH:[id number]    Fungus (Mycology) Culture PENDING  Incomplete   Fungal Source CSF  Final    Comment: Performed at [facility], [place] [name], [place], [place] [address]  Anaerobic culture w Gram Stain     Status: None   Collection Time: 08/03/21  4:32 PM   Specimen: PATH Cytology CSF; Cerebrospinal Fluid  Result Value Ref Range Status   Specimen Description CSF  Final   Special Requests NONE  Final   Culture   Final    NO ANAEROBES ISOLATED Performed at [facility], [place] [address] [place], [place] [address]    Report Status 08/08/2021 FINAL  Final  Fungus Culture  Result     Status: None   Collection Time: 08/03/21  4:32 PM  Result Value Ref Range Status   Result 1 Comment  Final    Comment: (NOTE) KOH/Calcofluor preparation:  no fungus observed. Performed At: [facility] [place], [place] [id number] Rush Farmer MD HQ:[id number]       Scheduled Meds:  hydrocortisone cream   Topical QID   mouth rinse  15 mL Mouth Rinse BID   OLANZapine  2.5 mg Oral QHS     LOS: 12 days   Cherene Altes, MD [facility] Office  [phone] Pager - Text Page per Shea Evans  If 7PM-7AM, please contact night-coverage per Amion 08/12/2021, 11:22 AM

## 2021-08-12 NOTE — TOC Progression Note (Signed)
Transition of Care [facility]) - Progression Note    Patient Details  Name: Marie [name] MRN: [id number] Date of Birth: 1951-05-06  Transition of Care [facility]) CM/SW [place], [place] Phone Number: 08/12/2021, 11:06 AM  Clinical Narrative:   CSW spoke with patient's spouse to update on progress for locating placement for patient, discuss barriers to placement. Spouse in agreement with looking for memory care facility. CSW explained that patient may need to be placed out of town, and spouse in agreement with that, as well. CSW discussed how we need to figure out payment for LTC, as patient does not have Medicaid. Patient gets approximately $1500 in social security, which will not cover LTC. Spouse unaware if patient has ever applied for Medicaid; says he thinks his daughter was working on that at one point, but she's gone off the deep end and can't be relied on for anything anymore. CSW explained reaching out to financial counseling to see if they can assist the spouse, and spouse in agreement. CSW sent email to financial counseling to request that they reach out to spouse to assist with completing Medicaid application for LTC. CSW also discussed updated information with [facility], and they are offering a bed for patient. CSW sent request to initiate insurance authorization to see if insurance will authorize short rehab stay prior to transition to long term care. CSW to follow.    Expected Discharge Plan: [place] Barriers to Discharge: Continued Medical Work up, Ship broker  Expected Discharge Plan and Services Expected Discharge Plan: [place]     Post Acute Care Choice: NA Living arrangements for the past 2 months: Single Family Home                                       Social Determinants of Health (SDOH) Interventions    Readmission Risk Interventions No flowsheet data found.

## 2021-08-13 DIAGNOSIS — G9341 Metabolic encephalopathy: Secondary | ICD-10-CM | POA: Diagnosis not present

## 2021-08-13 DIAGNOSIS — E1122 Type 2 diabetes mellitus with diabetic chronic kidney disease: Secondary | ICD-10-CM | POA: Diagnosis not present

## 2021-08-13 DIAGNOSIS — E039 Hypothyroidism, unspecified: Secondary | ICD-10-CM | POA: Diagnosis not present

## 2021-08-13 DIAGNOSIS — F419 Anxiety disorder, unspecified: Secondary | ICD-10-CM | POA: Diagnosis not present

## 2021-08-13 DIAGNOSIS — G309 Alzheimer's disease, unspecified: Secondary | ICD-10-CM | POA: Diagnosis not present

## 2021-08-13 DIAGNOSIS — N1831 Chronic kidney disease, stage 3a: Secondary | ICD-10-CM | POA: Diagnosis not present

## 2021-08-13 DIAGNOSIS — Z7401 Bed confinement status: Secondary | ICD-10-CM | POA: Diagnosis not present

## 2021-08-13 DIAGNOSIS — N179 Acute kidney failure, unspecified: Secondary | ICD-10-CM | POA: Diagnosis not present

## 2021-08-13 DIAGNOSIS — F03918 Unspecified dementia, unspecified severity, with other behavioral disturbance: Secondary | ICD-10-CM | POA: Diagnosis not present

## 2021-08-13 DIAGNOSIS — R4182 Altered mental status, unspecified: Secondary | ICD-10-CM | POA: Diagnosis not present

## 2021-08-13 DIAGNOSIS — F339 Major depressive disorder, recurrent, unspecified: Secondary | ICD-10-CM | POA: Diagnosis not present

## 2021-08-13 MED ORDER — QUETIAPINE FUMARATE 50 MG PO TABS: 50.0000 mg | ORAL_TABLET | Freq: Every day | ORAL | Status: DC

## 2021-08-13 MED ORDER — ACETAMINOPHEN 325 MG PO TABS: 650.0000 mg | ORAL_TABLET | Freq: Four times a day (QID) | ORAL | Status: DC | PRN

## 2021-08-13 NOTE — Care Management Important Message (Signed)
Important Message  Patient Details  Name: Marie [name] MRN: [id number] Date of Birth: Jun 25, 1951   Medicare Important Message Given:  Yes     Orbie Pyo 08/13/2021, 2:18 PM

## 2021-08-13 NOTE — Progress Notes (Signed)
Nutrition Brief Note  Patient identified on the Malnutrition Screening Tool (MST) Report.  Admitting Dx: Acute metabolic encephalopathy [H21.97] PMH:  Past Medical History:  Diagnosis Date   Dementia ([place])    Depression    Diabetes mellitus    Hypertension    Thyroid disease    Type II or unspecified type diabetes mellitus without mention of complication, not stated as uncontrolled    stopped metformin due to diarrhea, no meds now   Vitamin D deficiency    Filed Weights   07/31/21 1734 07/31/21 2300  Weight: 59 kg 50.1 kg  Body mass index is 18.67 kg/m. Patient meets criteria for normal based on current BMI.   Current diet order is regular, patient is consuming approximately 85-100% of meals at this time. Labs and medications reviewed.   Pt has discharge summary and orders signed and is to go to SNF today per MD. No nutrition interventions warranted at this time. If nutrition issues arise, please consult RD.    Theone Stanley., MS, RD, LDN (she/her/hers) RD pager number and weekend/on-call pager number located in [place].

## 2021-08-13 NOTE — TOC Transition Note (Signed)
Transition of Care [facility]) - CM/SW Discharge Note   Patient Details  Name: Marie [name] MRN: [id number] Date of Birth: 27-Jul-1950  Transition of Care [facility]) CM/SW Contact:  Geralynn Ochs, LCSW Phone Number: 08/13/2021, 10:58 AM   Clinical Narrative:   CSW received insurance approval for patient to admit to [place], spoke with spouse and son to update. They are agreeable to transfer. Transport scheduled with [facility] for next available.   Nurse to call report to [phone], Room 614-A.    Final next level of care: Skilled Nursing Facility Barriers to Discharge: Barriers Resolved   Patient Goals and CMS Choice Patient states their goals for this hospitalization and ongoing recovery are:: patient unable to participate in goal setting, not oriented CMS Medicare.gov Compare Post Acute Care list provided to:: Patient Represenative (must comment) Choice offered to / list presented to : Adult Children  Discharge Placement              Patient chooses bed at: [facility] Patient to be transferred to facility by: [place] Name of family member notified: Jeb Levering Patient and family notified of of transfer: 08/13/21  Discharge Plan and Services     Post Acute Care Choice: NA                               Social Determinants of Health (SDOH) Interventions     Readmission Risk Interventions No flowsheet data found.

## 2021-08-13 NOTE — Discharge Summary (Signed)
DISCHARGE SUMMARY  Marie [name]  MR#: [id number]  DOB:January 26, 1951  Date of Admission: 07/31/2021 Date of Discharge: 08/13/2021  Attending Physician:Deshon Koslowski Hennie Duos, MD  Patient's UXN:[id number], Gwyndolyn Saxon, MD  Consults: Neurology Neurosurgery  Disposition: D/C to SNF   Follow-up Appts:  Follow-up Information     Attending of Record at your chosen SNF Follow up.   Why: Ongoing care will be provided by the Attending Doctor at your chosen skilled nurising facility.                Discharge Diagnoses: Acute metabolic encephalopathy Chronic dementia with behavioral disturbances Hypernatremia Acute renal failure on stage IIIa CKD HTN DM2 Pruritus -lice infestation > resolved  Goals of care - comfort focused care  NO CODE BLUE   Initial presentation: 71yo with a history of dementia, depression, DM 2, HLD, HTN, hypothyroidism, CKD stage IIIa, and breast cancer who presented to the ED via law enforcement under IVC. She had been home alone after her husband required hospitalization 4 days prior.  The patient had not been receiving her medicines due to her husband's absence, and when he returned home he found her aggravated, combative, and consuming her own feces.  In the ED UA was without significant findings.  Alcohol and drug screens were negative.  CT head raised concern for subarachnoid hemorrhage.  MRI was attempted but was initially unable to be completed due to the patient's combativeness. Ultimately MRI imaging raised concern of hemorrhage v/s encephalitis.   Hospital Course:  Acute metabolic encephalopathy on chronic dementia with behavioral disturbances Felt to likely be due to severe metabolic derangements - MRI worrisome for autoimmune encephalitis versus malignancy - LP ruled out bacterial meningitis - CT chest abdomen and pelvis without evidence of occult malignancy - given 10+ year history of progressive severe dementia extensive evaluation was not felt to be in  the patient's best interest by the medical team or family - placement in a SNF for supportive care sought    Hypernatremia Reflective of severe dehydration -corrected with simple volume expansion   Acute renal failure on stage IIIa CKD Corrected with volume expansion -creatinine normal prior to d/c    HTN No indication for strict control in this clinical setting -blood pressure currently well controlled   DM2 CBG stable -no indication for strict control in this clinical setting   Pruritus -lice infestation Has been treated with permethrin - no evidence of persisting infestation   Goals of care Focus is to be on improving quality of life and minimizing testing/discomfort  Allergies as of 08/13/2021       Reactions   Penicillins Rash   Swelling in face - last 10 years        Medication List     STOP taking these medications    donepezil 23 MG Tabs tablet Commonly known as: ARICEPT   escitalopram 10 MG tablet Commonly known as: LEXAPRO       TAKE these medications    acetaminophen 325 MG tablet Commonly known as: TYLENOL Take 2 tablets (650 mg total) by mouth every 6 (six) hours as needed for mild pain (or Fever >/= 101).   QUEtiapine 50 MG tablet Commonly known as: SEROQUEL Take 1 tablet (50 mg total) by mouth at bedtime. What changed:  medication strength how much to take how to take this when to take this additional instructions        Day of Discharge BP 130/78 (BP Location: Right Arm)    Pulse 86  Temp 98 F (36.7 C) (Oral)    Resp 20    Ht 5' 4.5" (1.638 m)    Wt 50.1 kg Comment: bed was zeroed immediately prior to pt arrival   SpO2 100%    BMI 18.67 kg/m   Physical Exam: General: No acute respiratory distress Lungs: Clear to auscultation bilaterally  Cardiovascular: Regular rate and rhythm without murmur  Abdomen: Nontender, nondistended, soft, bowel sounds positive Extremities: No significant edema bilateral lower extremities  Basic  Metabolic Panel: Recent Labs  Lab 08/07/21 0156  NA 137  K 3.7  CL 107  CO2 22  GLUCOSE 145*  BUN 9  CREATININE 0.90  CALCIUM 8.5*    CBC: Recent Labs  Lab 08/07/21 0156 08/08/21 0220 08/09/21 0634 08/10/21 0429 08/11/21 0411  WBC 7.6 9.8 5.7 6.5 5.4  NEUTROABS 5.5 7.5 3.4 4.7 3.3  HGB 11.9* 12.6 11.5* 12.6 12.0  HCT 35.7* 37.9 35.0* 39.1 36.1  MCV 87.9 86.7 87.7 88.1 86.6  PLT 249 286 236 253 264    Time spent in discharge (includes decision making & examination of pt): 35 minutes  08/13/2021, 10:19 AM   Cherene Altes, MD [facility] Office  [phone]

## 2021-08-17 DIAGNOSIS — G9341 Metabolic encephalopathy: Secondary | ICD-10-CM | POA: Diagnosis not present

## 2021-08-22 LAB — CYTOLOGY - NON PAP

## 2021-09-02 LAB — FUNGUS CULTURE WITH STAIN

## 2021-09-02 LAB — FUNGUS CULTURE RESULT

## 2021-09-02 LAB — FUNGAL ORGANISM REFLEX

## 2021-09-07 DIAGNOSIS — F02C18 Dementia in other diseases classified elsewhere, severe, with other behavioral disturbance: Secondary | ICD-10-CM | POA: Diagnosis not present

## 2021-09-07 DIAGNOSIS — F29 Unspecified psychosis not due to a substance or known physiological condition: Secondary | ICD-10-CM | POA: Diagnosis not present

## 2021-09-08 DIAGNOSIS — H01004 Unspecified blepharitis left upper eyelid: Secondary | ICD-10-CM | POA: Diagnosis not present

## 2021-09-08 DIAGNOSIS — H2513 Age-related nuclear cataract, bilateral: Secondary | ICD-10-CM | POA: Diagnosis not present

## 2021-09-08 DIAGNOSIS — H01001 Unspecified blepharitis right upper eyelid: Secondary | ICD-10-CM | POA: Diagnosis not present

## 2021-09-20 ENCOUNTER — Emergency Department: Payer: Medicare PPO

## 2021-09-20 ENCOUNTER — Other Ambulatory Visit: Payer: Self-pay

## 2021-09-20 ENCOUNTER — Inpatient Hospital Stay
Admission: EM | Admit: 2021-09-20 | Discharge: 2021-11-23 | DRG: 054 | Disposition: A | Discharge status: Hospice | Payer: Medicare PPO | Attending: Internal Medicine | Admitting: Internal Medicine

## 2021-09-20 DIAGNOSIS — Z841 Family history of disorders of kidney and ureter: Secondary | ICD-10-CM

## 2021-09-20 DIAGNOSIS — Z96652 Presence of left artificial knee joint: Secondary | ICD-10-CM | POA: Diagnosis present

## 2021-09-20 DIAGNOSIS — Z751 Person awaiting admission to adequate facility elsewhere: Secondary | ICD-10-CM

## 2021-09-20 DIAGNOSIS — F02C18 Dementia in other diseases classified elsewhere, severe, with other behavioral disturbance: Secondary | ICD-10-CM | POA: Diagnosis present

## 2021-09-20 DIAGNOSIS — R636 Underweight: Secondary | ICD-10-CM | POA: Diagnosis present

## 2021-09-20 DIAGNOSIS — R627 Adult failure to thrive: Secondary | ICD-10-CM | POA: Diagnosis present

## 2021-09-20 DIAGNOSIS — R4701 Aphasia: Secondary | ICD-10-CM | POA: Diagnosis present

## 2021-09-20 DIAGNOSIS — I2510 Atherosclerotic heart disease of native coronary artery without angina pectoris: Secondary | ICD-10-CM | POA: Diagnosis present

## 2021-09-20 DIAGNOSIS — Z20822 Contact with and (suspected) exposure to covid-19: Secondary | ICD-10-CM | POA: Diagnosis present

## 2021-09-20 DIAGNOSIS — N1831 Chronic kidney disease, stage 3a: Secondary | ICD-10-CM | POA: Diagnosis present

## 2021-09-20 DIAGNOSIS — F02C11 Dementia in other diseases classified elsewhere, severe, with agitation: Secondary | ICD-10-CM | POA: Diagnosis present

## 2021-09-20 DIAGNOSIS — G936 Cerebral edema: Secondary | ICD-10-CM | POA: Diagnosis present

## 2021-09-20 DIAGNOSIS — D496 Neoplasm of unspecified behavior of brain: Principal | ICD-10-CM | POA: Diagnosis present

## 2021-09-20 DIAGNOSIS — R456 Violent behavior: Secondary | ICD-10-CM | POA: Diagnosis not present

## 2021-09-20 DIAGNOSIS — F02818 Dementia in other diseases classified elsewhere, unspecified severity, with other behavioral disturbance: Secondary | ICD-10-CM | POA: Diagnosis present

## 2021-09-20 DIAGNOSIS — G935 Compression of brain: Secondary | ICD-10-CM | POA: Diagnosis not present

## 2021-09-20 DIAGNOSIS — Z880 Allergy status to penicillin: Secondary | ICD-10-CM

## 2021-09-20 DIAGNOSIS — Z046 Encounter for general psychiatric examination, requested by authority: Secondary | ICD-10-CM

## 2021-09-20 DIAGNOSIS — Z803 Family history of malignant neoplasm of breast: Secondary | ICD-10-CM

## 2021-09-20 DIAGNOSIS — R4182 Altered mental status, unspecified: Secondary | ICD-10-CM | POA: Diagnosis not present

## 2021-09-20 DIAGNOSIS — Z825 Family history of asthma and other chronic lower respiratory diseases: Secondary | ICD-10-CM

## 2021-09-20 DIAGNOSIS — E039 Hypothyroidism, unspecified: Secondary | ICD-10-CM | POA: Diagnosis present

## 2021-09-20 DIAGNOSIS — R400 Somnolence: Secondary | ICD-10-CM | POA: Diagnosis not present

## 2021-09-20 DIAGNOSIS — F32A Depression, unspecified: Secondary | ICD-10-CM | POA: Diagnosis present

## 2021-09-20 DIAGNOSIS — Z515 Encounter for palliative care: Secondary | ICD-10-CM

## 2021-09-20 DIAGNOSIS — I129 Hypertensive chronic kidney disease with stage 1 through stage 4 chronic kidney disease, or unspecified chronic kidney disease: Secondary | ICD-10-CM | POA: Diagnosis present

## 2021-09-20 DIAGNOSIS — R4689 Other symptoms and signs involving appearance and behavior: Secondary | ICD-10-CM | POA: Diagnosis not present

## 2021-09-20 DIAGNOSIS — R451 Restlessness and agitation: Secondary | ICD-10-CM | POA: Diagnosis present

## 2021-09-20 DIAGNOSIS — I10 Essential (primary) hypertension: Secondary | ICD-10-CM | POA: Diagnosis not present

## 2021-09-20 DIAGNOSIS — Z7189 Other specified counseling: Secondary | ICD-10-CM | POA: Diagnosis not present

## 2021-09-20 DIAGNOSIS — C719 Malignant neoplasm of brain, unspecified: Secondary | ICD-10-CM | POA: Diagnosis not present

## 2021-09-20 DIAGNOSIS — Z681 Body mass index (BMI) 19 or less, adult: Secondary | ICD-10-CM | POA: Diagnosis not present

## 2021-09-20 DIAGNOSIS — E119 Type 2 diabetes mellitus without complications: Secondary | ICD-10-CM

## 2021-09-20 DIAGNOSIS — E1122 Type 2 diabetes mellitus with diabetic chronic kidney disease: Secondary | ICD-10-CM | POA: Diagnosis present

## 2021-09-20 DIAGNOSIS — R600 Localized edema: Secondary | ICD-10-CM | POA: Diagnosis not present

## 2021-09-20 DIAGNOSIS — M8580 Other specified disorders of bone density and structure, unspecified site: Secondary | ICD-10-CM | POA: Diagnosis present

## 2021-09-20 DIAGNOSIS — E785 Hyperlipidemia, unspecified: Secondary | ICD-10-CM | POA: Diagnosis present

## 2021-09-20 DIAGNOSIS — Z853 Personal history of malignant neoplasm of breast: Secondary | ICD-10-CM

## 2021-09-20 DIAGNOSIS — Z66 Do not resuscitate: Secondary | ICD-10-CM | POA: Diagnosis present

## 2021-09-20 DIAGNOSIS — L299 Pruritus, unspecified: Secondary | ICD-10-CM

## 2021-09-20 DIAGNOSIS — G9389 Other specified disorders of brain: Secondary | ICD-10-CM

## 2021-09-20 DIAGNOSIS — Z79899 Other long term (current) drug therapy: Secondary | ICD-10-CM

## 2021-09-20 DIAGNOSIS — R000 Tachycardia, unspecified: Secondary | ICD-10-CM

## 2021-09-20 DIAGNOSIS — M7989 Other specified soft tissue disorders: Secondary | ICD-10-CM | POA: Diagnosis not present

## 2021-09-20 DIAGNOSIS — I700 Atherosclerosis of aorta: Secondary | ICD-10-CM | POA: Diagnosis present

## 2021-09-20 DIAGNOSIS — M79601 Pain in right arm: Secondary | ICD-10-CM | POA: Diagnosis not present

## 2021-09-20 DIAGNOSIS — G309 Alzheimer's disease, unspecified: Secondary | ICD-10-CM | POA: Diagnosis present

## 2021-09-20 DIAGNOSIS — E559 Vitamin D deficiency, unspecified: Secondary | ICD-10-CM | POA: Diagnosis present

## 2021-09-20 DIAGNOSIS — Z85828 Personal history of other malignant neoplasm of skin: Secondary | ICD-10-CM

## 2021-09-20 LAB — COMPREHENSIVE METABOLIC PANEL
ALT: 11 U/L (ref 0–44)
AST: 23 U/L (ref 15–41)
Albumin: 4 g/dL (ref 3.5–5.0)
Alkaline Phosphatase: 66 U/L (ref 38–126)
Anion gap: 9 (ref 5–15)
BUN: 29 mg/dL — ABNORMAL HIGH (ref 8–23)
CO2: 27 mmol/L (ref 22–32)
Calcium: 9.3 mg/dL (ref 8.9–10.3)
Chloride: 107 mmol/L (ref 98–111)
Creatinine, Ser: 0.88 mg/dL (ref 0.44–1.00)
GFR, Estimated: >60 mL/min (ref >60)
Glucose, Bld: 151 mg/dL — ABNORMAL HIGH (ref 70–99)
Potassium: 3.9 mmol/L (ref 3.5–5.1)
Sodium: 143 mmol/L (ref 135–145)
Total Bilirubin: 0.9 mg/dL (ref 0.3–1.2)
Total Protein: 7.5 g/dL (ref 6.5–8.1)

## 2021-09-20 LAB — CBC
HCT: 41.2 % (ref 36.0–46.0)
Hemoglobin: 13 g/dL (ref 12.0–15.0)
MCH: 28.8 pg (ref 26.0–34.0)
MCHC: 31.6 g/dL (ref 30.0–36.0)
MCV: 91.2 fL (ref 80.0–100.0)
Platelets: 269 10*3/uL (ref 150–400)
RBC: 4.52 MIL/uL (ref 3.87–5.11)
RDW: 14.6 % (ref 11.5–15.5)
WBC: 8.8 10*3/uL (ref 4.0–10.5)
nRBC: 0 % (ref 0.0–0.2)

## 2021-09-20 LAB — ETHANOL: Alcohol, Ethyl (B): <10 mg/dL (ref <10)

## 2021-09-20 LAB — ACETAMINOPHEN LEVEL: Acetaminophen (Tylenol), Serum: <10 ug/mL — ABNORMAL LOW (ref 10–30)

## 2021-09-20 LAB — SALICYLATE LEVEL: Salicylate Lvl: <7 mg/dL — ABNORMAL LOW (ref 7.0–30.0)

## 2021-09-20 MED ORDER — DEXAMETHASONE SODIUM PHOSPHATE 10 MG/ML IJ SOLN
10.0000 mg | Freq: Once | INTRAMUSCULAR | Status: AC
  Administered 2021-09-21: 10 mg via INTRAVENOUS
  Filled 2021-09-20: qty 1
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
## 2021-09-20 NOTE — ED Notes (Signed)
Teleneuro cart at bedside

## 2021-09-20 NOTE — ED Notes (Signed)
MOST FORM AND DNR FROM FACILITY ?

## 2021-09-20 NOTE — ED Notes (Signed)
Posey alarm in place and on ?
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
## 2021-09-21 DIAGNOSIS — I2510 Atherosclerotic heart disease of native coronary artery without angina pectoris: Secondary | ICD-10-CM | POA: Diagnosis present

## 2021-09-21 DIAGNOSIS — C719 Malignant neoplasm of brain, unspecified: Secondary | ICD-10-CM | POA: Diagnosis not present

## 2021-09-21 DIAGNOSIS — E039 Hypothyroidism, unspecified: Secondary | ICD-10-CM | POA: Diagnosis present

## 2021-09-21 DIAGNOSIS — E119 Type 2 diabetes mellitus without complications: Secondary | ICD-10-CM

## 2021-09-21 DIAGNOSIS — Z96652 Presence of left artificial knee joint: Secondary | ICD-10-CM | POA: Diagnosis present

## 2021-09-21 DIAGNOSIS — E1122 Type 2 diabetes mellitus with diabetic chronic kidney disease: Secondary | ICD-10-CM | POA: Diagnosis present

## 2021-09-21 DIAGNOSIS — G309 Alzheimer's disease, unspecified: Secondary | ICD-10-CM

## 2021-09-21 DIAGNOSIS — F02818 Dementia in other diseases classified elsewhere, unspecified severity, with other behavioral disturbance: Secondary | ICD-10-CM

## 2021-09-21 DIAGNOSIS — I129 Hypertensive chronic kidney disease with stage 1 through stage 4 chronic kidney disease, or unspecified chronic kidney disease: Secondary | ICD-10-CM | POA: Diagnosis present

## 2021-09-21 DIAGNOSIS — Z046 Encounter for general psychiatric examination, requested by authority: Secondary | ICD-10-CM

## 2021-09-21 DIAGNOSIS — R4689 Other symptoms and signs involving appearance and behavior: Secondary | ICD-10-CM | POA: Diagnosis not present

## 2021-09-21 DIAGNOSIS — R451 Restlessness and agitation: Secondary | ICD-10-CM | POA: Diagnosis present

## 2021-09-21 DIAGNOSIS — G935 Compression of brain: Secondary | ICD-10-CM

## 2021-09-21 DIAGNOSIS — Z681 Body mass index (BMI) 19 or less, adult: Secondary | ICD-10-CM | POA: Diagnosis not present

## 2021-09-21 DIAGNOSIS — G936 Cerebral edema: Secondary | ICD-10-CM | POA: Diagnosis present

## 2021-09-21 DIAGNOSIS — R627 Adult failure to thrive: Secondary | ICD-10-CM | POA: Diagnosis present

## 2021-09-21 DIAGNOSIS — R4182 Altered mental status, unspecified: Secondary | ICD-10-CM | POA: Diagnosis not present

## 2021-09-21 DIAGNOSIS — D496 Neoplasm of unspecified behavior of brain: Secondary | ICD-10-CM | POA: Diagnosis present

## 2021-09-21 DIAGNOSIS — F02C18 Dementia in other diseases classified elsewhere, severe, with other behavioral disturbance: Secondary | ICD-10-CM | POA: Diagnosis present

## 2021-09-21 DIAGNOSIS — R400 Somnolence: Secondary | ICD-10-CM | POA: Diagnosis not present

## 2021-09-21 DIAGNOSIS — R636 Underweight: Secondary | ICD-10-CM | POA: Diagnosis present

## 2021-09-21 DIAGNOSIS — R4701 Aphasia: Secondary | ICD-10-CM | POA: Diagnosis present

## 2021-09-21 DIAGNOSIS — E785 Hyperlipidemia, unspecified: Secondary | ICD-10-CM | POA: Diagnosis present

## 2021-09-21 DIAGNOSIS — Z853 Personal history of malignant neoplasm of breast: Secondary | ICD-10-CM | POA: Diagnosis not present

## 2021-09-21 DIAGNOSIS — Z880 Allergy status to penicillin: Secondary | ICD-10-CM | POA: Diagnosis not present

## 2021-09-21 DIAGNOSIS — N1831 Chronic kidney disease, stage 3a: Secondary | ICD-10-CM | POA: Diagnosis present

## 2021-09-21 DIAGNOSIS — I700 Atherosclerosis of aorta: Secondary | ICD-10-CM | POA: Diagnosis present

## 2021-09-21 DIAGNOSIS — Z20822 Contact with and (suspected) exposure to covid-19: Secondary | ICD-10-CM | POA: Diagnosis present

## 2021-09-21 DIAGNOSIS — Z66 Do not resuscitate: Secondary | ICD-10-CM | POA: Diagnosis present

## 2021-09-21 DIAGNOSIS — G9389 Other specified disorders of brain: Secondary | ICD-10-CM | POA: Diagnosis not present

## 2021-09-21 DIAGNOSIS — F32A Depression, unspecified: Secondary | ICD-10-CM | POA: Diagnosis present

## 2021-09-21 DIAGNOSIS — F02C11 Dementia in other diseases classified elsewhere, severe, with agitation: Secondary | ICD-10-CM | POA: Diagnosis present

## 2021-09-21 DIAGNOSIS — E559 Vitamin D deficiency, unspecified: Secondary | ICD-10-CM | POA: Diagnosis present

## 2021-09-21 DIAGNOSIS — Z515 Encounter for palliative care: Secondary | ICD-10-CM | POA: Diagnosis not present

## 2021-09-21 DIAGNOSIS — Z7189 Other specified counseling: Secondary | ICD-10-CM | POA: Diagnosis not present

## 2021-09-21 LAB — CBG MONITORING, ED
Glucose-Capillary: 122 mg/dL — ABNORMAL HIGH (ref 70–99)
Glucose-Capillary: 126 mg/dL — ABNORMAL HIGH (ref 70–99)
Glucose-Capillary: 138 mg/dL — ABNORMAL HIGH (ref 70–99)
Glucose-Capillary: 153 mg/dL — ABNORMAL HIGH (ref 70–99)
Glucose-Capillary: 157 mg/dL — ABNORMAL HIGH (ref 70–99)

## 2021-09-21 MED ORDER — QUETIAPINE FUMARATE 25 MG PO TABS
50.0000 mg | ORAL_TABLET | Freq: Every day | ORAL | Status: DC
  Administered 2021-09-21 – 2021-11-19 (×59): 50 mg via ORAL
  Filled 2021-09-21 (×5): qty 2
  Filled 2021-09-21: qty 0.5
  Filled 2021-09-21: qty 2
  Filled 2021-09-21 (×2): qty 0.5
  Filled 2021-09-21 (×2): qty 2
  Filled 2021-09-21: qty 0.5
  Filled 2021-09-21 (×2): qty 2
  Filled 2021-09-21 (×2): qty 0.5
  Filled 2021-09-21 (×2): qty 2
  Filled 2021-09-21: qty 0.5
  Filled 2021-09-21: qty 2
  Filled 2021-09-21: qty 0.5
  Filled 2021-09-21 (×2): qty 2
  Filled 2021-09-21 (×3): qty 0.5
  Filled 2021-09-21 (×8): qty 2
  Filled 2021-09-21: qty 0.5
  Filled 2021-09-21 (×5): qty 2
  Filled 2021-09-21: qty 0.5
  Filled 2021-09-21: qty 2
  Filled 2021-09-21: qty 0.5
  Filled 2021-09-21: qty 2
  Filled 2021-09-21: qty 0.5
  Filled 2021-09-21: qty 2
  Filled 2021-09-21 (×7): qty 0.5
  Filled 2021-09-21 (×3): qty 2
  Filled 2021-09-21: qty 0.5
  Filled 2021-09-21 (×2): qty 2
  Filled 2021-09-21: qty 0.5
  Filled 2021-09-21: qty 2

## 2021-09-21 MED ORDER — INSULIN ASPART 100 UNIT/ML IJ SOLN: 0.0000 [IU] | Freq: Every day | INTRAMUSCULAR | Status: DC

## 2021-09-21 MED ORDER — ONDANSETRON HCL 4 MG/2ML IJ SOLN
4.0000 mg | Freq: Four times a day (QID) | INTRAMUSCULAR | Status: DC | PRN
Start: 2021-09-21 — End: 2021-11-23

## 2021-09-21 MED ORDER — INSULIN ASPART 100 UNIT/ML IJ SOLN: 0.0000 [IU] | Freq: Three times a day (TID) | INTRAMUSCULAR | Status: DC

## 2021-09-21 MED ORDER — ONDANSETRON HCL 4 MG PO TABS: 4.0000 mg | ORAL_TABLET | Freq: Four times a day (QID) | ORAL | Status: DC | PRN

## 2021-09-21 MED ORDER — ACETAMINOPHEN 650 MG RE SUPP: 650.0000 mg | Freq: Four times a day (QID) | RECTAL | Status: DC | PRN

## 2021-09-21 MED ORDER — ENOXAPARIN SODIUM 40 MG/0.4ML IJ SOSY
40.0000 mg | PREFILLED_SYRINGE | INTRAMUSCULAR | Status: DC
  Administered 2021-09-21 – 2021-09-22 (×2): 40 mg via SUBCUTANEOUS
  Filled 2021-09-21 (×2): qty 0.4

## 2021-09-21 MED ORDER — INSULIN ASPART 100 UNIT/ML IJ SOLN
0.0000 [IU] | INTRAMUSCULAR | Status: DC
  Administered 2021-09-22 (×2): 2 [IU] via SUBCUTANEOUS
  Filled 2021-09-21 (×2): qty 1

## 2021-09-21 MED ORDER — DEXAMETHASONE SODIUM PHOSPHATE 4 MG/ML IJ SOLN
4.0000 mg | Freq: Three times a day (TID) | INTRAMUSCULAR | Status: DC
  Administered 2021-09-21 – 2021-09-22 (×4): 4 mg via INTRAVENOUS
  Filled 2021-09-21 (×4): qty 1

## 2021-09-21 MED ORDER — ACETAMINOPHEN 325 MG PO TABS
650.0000 mg | ORAL_TABLET | Freq: Four times a day (QID) | ORAL | Status: DC | PRN
  Administered 2021-11-17: 650 mg via ORAL
  Filled 2021-09-21: qty 2

## 2021-09-21 MED ORDER — HALOPERIDOL LACTATE 5 MG/ML IJ SOLN
2.0000 mg | Freq: Four times a day (QID) | INTRAMUSCULAR | Status: AC | PRN
  Administered 2021-09-23 (×2): 2 mg via INTRAVENOUS
  Filled 2021-09-21 (×2): qty 1

## 2021-09-21 NOTE — Treatment Plan (Signed)
Pt seen and examined. Full note will follow. ? ?Pt unable to provide history on her own. Unable to reach POA over phone. Chart reviewed. On last recent admit, patient's wishes were noted to be DNR. At that time, pt had also filled out MOST form which was scanned in chart. On MOST form, pt's wishes were clearly stated to be DNR. Based on pt's known prior wishes, will place DNR order now and will continue to attempt to reach POA to update. ? ?

## 2021-09-21 NOTE — ED Notes (Signed)
Bed linen adjusted, pt checked for incontinence, brief dry. Pt pulling at brief but remains in place. ?
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
## 2021-09-21 NOTE — ED Notes (Signed)
Pt is not taking any food or drink by mouth. ?

## 2021-09-21 NOTE — Assessment & Plan Note (Addendum)
Haldol as needed for agitation and delirium precautions as above ?-Continue Seroquel as needed ?

## 2021-09-21 NOTE — Assessment & Plan Note (Addendum)
BP trends are soft, cont off bp meds ?

## 2021-09-21 NOTE — Assessment & Plan Note (Addendum)
Possibly multifactorial and related to progressive Alzheimer's with behavioral disturbance as well as vasogenic edema from brain mass/cerebritis ?Continued IVC with one-to-one sitter for patient's safety ?Treat vasogenic edema as will be outlined below ?Delirium precautions ?Haldol as needed ?

## 2021-09-21 NOTE — Assessment & Plan Note (Addendum)
Sliding scale insulin coverage 
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
## 2021-09-21 NOTE — ED Notes (Signed)
Pt continues to not drink nor eat any thing. ?

## 2021-09-21 NOTE — ED Notes (Signed)
This tech got vitals on pt, hard to get a blood pressure but obtained one.  ?

## 2021-09-21 NOTE — Assessment & Plan Note (Addendum)
Seems stable at this time ?No meds noted on MAR ?

## 2021-09-21 NOTE — ED Notes (Signed)
Pt did not eat lunch nor drink. ?
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
## 2021-09-21 NOTE — ED Notes (Signed)
Pt resting in bed at this time with eyes closed.  RR even and unlabored.  No needs identified at this time.  ?

## 2021-09-21 NOTE — Assessment & Plan Note (Addendum)
Discussed case with patient's husband, who affirms no further intervention, including biopsy and reports "the Reita Cliche will take her when he's ready" ?Pt's husband confirms patient's known wishes for DNR/DNI ?Given CT findings of enlarging mass and midline shift, Palliative Care has been consulted. Prognosis will definitely be <6 months, and perhaps even down to <2 weeks given midline shift, making pt eligible for hospice  ?Did not qualify for residential hospice ?-Continue Decadron for vasogenic edema ?-Continue with supportive care ?
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
## 2021-09-21 NOTE — Assessment & Plan Note (Signed)
Continue IVC ?

## 2021-09-21 NOTE — ED Notes (Signed)
INVOLUNTARY looking at medical admit and pallative care consult ?

## 2021-09-21 NOTE — ED Notes (Signed)
Pt cleaned of wet brief at this time and new, dry brief placed on pt.  Pt somewhat combative when attempting to change, speaking nonsensically.   ?

## 2021-09-22 ENCOUNTER — Encounter: Payer: Self-pay | Admitting: Internal Medicine

## 2021-09-22 DIAGNOSIS — D496 Neoplasm of unspecified behavior of brain: Secondary | ICD-10-CM

## 2021-09-22 DIAGNOSIS — R451 Restlessness and agitation: Secondary | ICD-10-CM | POA: Diagnosis not present

## 2021-09-22 DIAGNOSIS — G309 Alzheimer's disease, unspecified: Secondary | ICD-10-CM | POA: Diagnosis not present

## 2021-09-22 DIAGNOSIS — Z66 Do not resuscitate: Secondary | ICD-10-CM

## 2021-09-22 DIAGNOSIS — G9389 Other specified disorders of brain: Secondary | ICD-10-CM | POA: Diagnosis not present

## 2021-09-22 DIAGNOSIS — F02818 Dementia in other diseases classified elsewhere, unspecified severity, with other behavioral disturbance: Secondary | ICD-10-CM

## 2021-09-22 DIAGNOSIS — R4182 Altered mental status, unspecified: Secondary | ICD-10-CM | POA: Diagnosis not present

## 2021-09-22 DIAGNOSIS — Z7189 Other specified counseling: Secondary | ICD-10-CM

## 2021-09-22 DIAGNOSIS — R4689 Other symptoms and signs involving appearance and behavior: Secondary | ICD-10-CM

## 2021-09-22 DIAGNOSIS — E119 Type 2 diabetes mellitus without complications: Secondary | ICD-10-CM

## 2021-09-22 DIAGNOSIS — G935 Compression of brain: Secondary | ICD-10-CM

## 2021-09-22 DIAGNOSIS — Z515 Encounter for palliative care: Secondary | ICD-10-CM

## 2021-09-22 DIAGNOSIS — Z046 Encounter for general psychiatric examination, requested by authority: Secondary | ICD-10-CM

## 2021-09-22 LAB — CBG MONITORING, ED
Glucose-Capillary: 146 mg/dL — ABNORMAL HIGH (ref 70–99)
Glucose-Capillary: 135 mg/dL — ABNORMAL HIGH (ref 70–99)

## 2021-09-22 MED ORDER — GLYCOPYRROLATE 0.2 MG/ML IJ SOLN
0.2000 mg | INTRAMUSCULAR | Status: DC | PRN
  Filled 2021-09-22: qty 1

## 2021-09-22 MED ORDER — MORPHINE SULFATE (PF) 2 MG/ML IV SOLN: 0.5000 mg | INTRAVENOUS | Status: DC | PRN

## 2021-09-22 MED ORDER — BIOTENE DRY MOUTH MT LIQD
15.0000 mL | OROMUCOSAL | Status: DC | PRN
  Filled 2021-09-22: qty 15

## 2021-09-22 MED ORDER — MORPHINE SULFATE (PF) 2 MG/ML IV SOLN
0.5000 mg | INTRAVENOUS | Status: DC | PRN
  Administered 2021-11-03: 0.5 mg via INTRAVENOUS
  Filled 2021-09-22: qty 1

## 2021-09-22 MED ORDER — GLYCOPYRROLATE 1 MG PO TABS
1.0000 mg | ORAL_TABLET | ORAL | Status: DC | PRN
  Filled 2021-09-22: qty 1

## 2021-09-22 MED ORDER — ALBUTEROL SULFATE (2.5 MG/3ML) 0.083% IN NEBU: 2.5000 mg | INHALATION_SOLUTION | RESPIRATORY_TRACT | Status: DC | PRN

## 2021-09-22 MED ORDER — POLYVINYL ALCOHOL 1.4 % OP SOLN: 1.0000 [drp] | Freq: Four times a day (QID) | OPHTHALMIC | Status: DC | PRN

## 2021-09-22 NOTE — ED Notes (Signed)
Pt fed dinner tray and given tea. 90% meal was eaten.  ?

## 2021-09-22 NOTE — Progress Notes (Signed)
?   09/22/21 2300  ?Clinical Encounter Type  ?Visited With Patient  ?Visit Type Initial  ?Referral From Nurse  ?Consult/Referral To Chaplain  ? ?Chaplain responded to nurse consult. Chaplain visited with patient and sitter. Patient was engaged in conversation with the Chaplain however it wasn't responding appropriately in conversation. Patient seemed confused and fixated on chewing blanket. Chaplain provided hospitality with ice water. Patient seem to appreciated [name] visit.  ?
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
## 2021-09-22 NOTE — ED Notes (Signed)
Pt ate 100% lunch tray and was given diet cola. Pt needs assistance eating.  ?

## 2021-09-22 NOTE — ED Notes (Signed)
Pt  IVC/ Medical  Admit ?
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
## 2021-09-22 NOTE — Progress Notes (Signed)
Consult acknowledged. ? ?After reviewing the patient's chart and assessing the patient at bedside, I spoke with patient's spouse Marie [name] over the phone.  Marie [name] plans to be at the hospital this afternoon.   ? ?We plan to meet to discuss patient's goals of care and disposition at 2:30 PM. ? ?Marie [name]. Marie Hornbeck, DNP, FNP-BC ?Palliative Medicine Team ?Team Phone # [phone] ? ?NO CHARGE ?

## 2021-09-22 NOTE — ED Notes (Signed)
Pt cleansed of urine and changed into hospital gown. Pt belongings in bag beside bed. Pt remains with IV in arm. Rewrapped with coban. Pt has yellow socks and fall alarm on.  ?

## 2021-09-22 NOTE — ED Notes (Signed)
Spoke with floor nurse. Waiting for available sitter for pt on floor.  ?

## 2021-09-22 NOTE — Progress Notes (Signed)
Manufacturing engineer [facility]) Hospital Liaison Note ? ?Received request from Transitions of Care Manager Pryor Montes  for family interest in [facility]. Visited patient at bedside and spoke with husband/Michael to confirm interest and explain services. ? ?Approval for [employer] is determined by [facility] MD. Once [facility] [place] MD has determined [facility] eligibility, [place] will update hospital staff and family. Eligibility is pending ? ?Please do not hesitate to call with any hospice related questions.  ?  ?Thank you for the opportunity to participate in this patient's care. ? ?Daphene Calamity, MSW ?[place] Hospital Liaison  ?[phone] ? ? ?

## 2021-09-22 NOTE — ED Notes (Signed)
Pt linens changed, clean gown and brief placed. Side rails up. Bed in lowest locked position ?

## 2021-09-22 NOTE — ED Notes (Signed)
Informed RN bed assigned 

## 2021-09-22 NOTE — ED Notes (Signed)
IVC pending placement 

## 2021-09-23 DIAGNOSIS — F02818 Dementia in other diseases classified elsewhere, unspecified severity, with other behavioral disturbance: Secondary | ICD-10-CM | POA: Diagnosis not present

## 2021-09-23 DIAGNOSIS — G309 Alzheimer's disease, unspecified: Secondary | ICD-10-CM | POA: Diagnosis not present

## 2021-09-23 DIAGNOSIS — R4182 Altered mental status, unspecified: Secondary | ICD-10-CM | POA: Diagnosis not present

## 2021-09-23 DIAGNOSIS — G9389 Other specified disorders of brain: Secondary | ICD-10-CM | POA: Diagnosis not present

## 2021-09-23 DIAGNOSIS — D496 Neoplasm of unspecified behavior of brain: Secondary | ICD-10-CM | POA: Diagnosis not present

## 2021-09-23 DIAGNOSIS — I2510 Atherosclerotic heart disease of native coronary artery without angina pectoris: Secondary | ICD-10-CM

## 2021-09-23 LAB — URINE DRUG SCREEN, QUALITATIVE (ARMC ONLY)
Amphetamines, Ur Screen: NOT DETECTED
Barbiturates, Ur Screen: NOT DETECTED
Benzodiazepine, Ur Scrn: NOT DETECTED
Cannabinoid 50 Ng, Ur ~~LOC~~: NOT DETECTED
Cocaine Metabolite,Ur ~~LOC~~: NOT DETECTED
MDMA (Ecstasy)Ur Screen: NOT DETECTED
Methadone Scn, Ur: NOT DETECTED
Opiate, Ur Screen: NOT DETECTED
Phencyclidine (PCP) Ur S: NOT DETECTED
Tricyclic, Ur Screen: NOT DETECTED

## 2021-09-23 LAB — URINALYSIS, ROUTINE W REFLEX MICROSCOPIC
Bilirubin Urine: NEGATIVE
Glucose, UA: NEGATIVE mg/dL
Hgb urine dipstick: NEGATIVE
Ketones, ur: NEGATIVE mg/dL
Nitrite: NEGATIVE
Protein, ur: NEGATIVE mg/dL
Specific Gravity, Urine: 1.01 (ref 1.005–1.030)
pH: 5 (ref 5.0–8.0)

## 2021-09-23 NOTE — TOC Progression Note (Signed)
Transition of Care (TOC) - Progression Note  ? ? ?Patient Details  ?Name: Marie [name] ?MRN: [id number] ?Date of Birth: 22-Jan-1951 ? ?Transition of Care (TOC) CM/SW Contact  ?Pete Pelt, RN ?Phone Number: ?09/23/2021, 11:13 AM ? ?Clinical Narrative:   As per Lorayne Bender at [facility], patient is hospice appropriate, but not Hospice House appropriate.  Palliative Care to contact [facility] to discuss hospice house due to patient's very poor prognosis.  TOC to follow. ? ? ? ?  ?  ? ?Expected Discharge Plan and Services ?  ?  ?  ?  ?  ?                ?  ?  ?  ?  ?  ?  ?  ?  ?  ?  ? ? ?Social Determinants of Health (SDOH) Interventions ?  ? ?Readmission Risk Interventions ?   ? View : No data to display.  ?  ?  ?  ? ? ?

## 2021-09-23 NOTE — Plan of Care (Signed)
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
## 2021-09-23 NOTE — Progress Notes (Signed)
Manufacturing engineer [facility]) Hospital Liaison Note ?  ?Approval for [employer] is determined by [facility] MD. [facility] MD has determined [facility] eligibility: not appropriate. However, patient is appropriate for Hospice Services. [facility] to continue to follow in the event patient declines. ?  ?Please do not hesitate to call with any hospice related questions.  ?  ?Thank you for the opportunity to participate in this patient's care. ?  ?Daphene Calamity, MSW ?[place]  ?[phone] ?
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
## 2021-09-24 NOTE — Plan of Care (Signed)
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
## 2021-09-24 NOTE — Care Management Important Message (Signed)
Important Message ? ?Patient Details  ?Name: Marie [name] ?MRN: [id number] ?Date of Birth: 04/19/1951 ? ? ?Medicare Important Message Given:  Other (see comment) ? ?Patient is on comfort care and will discharge with hospice. Out of respect for the patient and family no Important Message from [facility] given. ? ? ?Juliann Pulse A Dougles Kimmey ?09/24/2021, 8:16 AM ?

## 2021-09-24 NOTE — Progress Notes (Signed)
Manufacturing engineer [facility]) Hospital Liaison Note ?  ?[facility] will continue to follow through dispo. If the event that family would like to utilize home hospice services upon discharge. ? ?Please do not hesitate to call with any hospice related questions.  ?  ?Thank you for the opportunity to participate in this patient's care. ?  ?Daphene Calamity, MSW ?[place]  ?[phone] ?

## 2021-09-24 NOTE — NC FL2 (Signed)
?[place] MEDICAID FL2 LEVEL OF CARE SCREENING TOOL  ?  ? ?IDENTIFICATION  ?Patient Name: ?Marie [name] Birthdate: 19-Aug-1950 Sex: female Admission Date (Current Location): ?09/20/2021  ?[place] and [place] Number: ? [place] ?  Facility and Address:  ?[facility], [address], [place], [place] [address] ?     Provider Number: ?[id number]  ?Attending Physician Name and Address:  ?Jennye Boroughs, MD ? Relative Name and Phone Number:  ?  ?   ?Current Level of Care: ?Hospital Recommended Level of Care: ?Other (Comment) (SNF HOSPICE) Prior Approval Number: ?  ? ?Date Approved/Denied: ?  PASRR Number: ?[id number] A ? ?Discharge Plan: ?  ?  ? ?Current Diagnoses: ?Patient Active Problem List  ? Diagnosis Date Noted  ? Agitation 09/21/2021  ? Neoplasm of brain causing mass effect on adjacent structures ([place])   ? Involuntary commitment   ? Diabetes mellitus without complication ([place])   ? Acute metabolic encephalopathy 09/38/1829  ? Acute renal failure superimposed on stage 3a chronic kidney disease ([place]) 07/31/2021  ? Hypernatremia 07/31/2021  ? Social problem 07/31/2021  ? Cellulitis of right knee 09/02/2020  ? Intertriginous candidiasis 09/02/2020  ? Cellulitis of breast 09/02/2020  ? 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 Hallucinations 09/02/2020  ? History of SCC (squamous cell carcinoma) of skin (chest, 03/2019) 04/22/2020  ? Osteopenia 01/21/2020  ? Alzheimer's dementia with behavioral disturbance ([place]) 01/21/2020  ? Aortic atherosclerosis ([place]) by CTA scan on 11/04/2019 01/20/2020  ? CAD (coronary artery disease) 11/06/2019  ? History of NSTEMI - 11/04/19 11/04/2019  ? Malignant neoplasm of upper-outer quadrant of right breast in female, estrogen receptor positive ([place]) 02/13/2018  ? CKD stage 3 due to type 2 diabetes mellitus ([place]) 10/04/2017  ? BMI 22.0-22.9, adult 05/05/2015  ? Generalized anxiety disorder 08/04/2014  ? Essential hypertension 04/28/2014  ? Medication management  04/28/2014  ? Vitamin D deficiency 10/04/2013  ? Type 2 diabetes mellitus with stage 3a chronic kidney disease, without long-term current use of insulin ([place])   ? Depression, major, recurrent, in partial remission ([place])   ? SDAT    ? Hyperlipidemia associated with type 2 diabetes mellitus ([name]) 07/28/2007  ? Allergic rhinitis 07/28/2007  ? ? ?Orientation RESPIRATION BLADDER Height & Weight   ?  ?Self ? Normal Incontinent, External catheter Weight: 100 lb (45.4 kg) ?Height:  '5\' 5"'$  (165.1 cm)  ?BEHAVIORAL SYMPTOMS/MOOD NEUROLOGICAL BOWEL NUTRITION STATUS  ?    Incontinent Diet (heart healthy/carb modified)  ?AMBULATORY STATUS COMMUNICATION OF NEEDS Skin   ?Total Care   Other (Comment) (dry) ?  ?  ?  ?    ?     ?     ? ? ?Personal Care Assistance Level of Assistance  ?Bathing, Feeding, Dressing Bathing Assistance: Maximum assistance ?Feeding assistance: Maximum assistance ?Dressing Assistance: Maximum assistance ?   ? ?Functional Limitations Info  ?    ?  ?   ? ? ?SPECIAL CARE FACTORS FREQUENCY  ?    ?  ?  ?  ?  ?  ?  ?   ? ? ?Contractures    ? ? ?Additional Factors Info  ?Code Status, Allergies Code Status Info: DNR ?Allergies Info: penicillins ?  ?  ?  ?   ? ?Current Medications (09/24/2021):  This is the current hospital active medication list ?Current Facility-Administered Medications  ?Medication Dose Route Frequency Provider Last Rate Last Admin  ? acetaminophen (TYLENOL) tablet 650 mg  650 mg Oral Q6H PRN Judd Gaudier  V, MD      ? Or  ? acetaminophen (TYLENOL) suppository 650 mg  650 mg Rectal Q6H PRN Athena Masse, MD      ? antiseptic oral rinse (BIOTENE) solution 15 mL  15 mL Topical PRN Jordan Hawks, FNP      ? glycopyrrolate (ROBINUL) tablet 1 mg  1 mg Oral Q4H PRN Jordan Hawks, FNP      ? Or  ? glycopyrrolate (ROBINUL) injection 0.2 mg  0.2 mg Subcutaneous Q4H PRN Jordan Hawks, FNP      ? Or  ? glycopyrrolate (ROBINUL) injection 0.2 mg  0.2 mg Intravenous Q4H PRN Jordan Hawks, FNP      ? morphine  (PF) 2 MG/ML injection 0.5 mg  0.5 mg Intravenous Q4H PRN Sharion Settler, NP      ? ondansetron [facility]) tablet 4 mg  4 mg Oral Q6H PRN Athena Masse, MD      ? Or  ? ondansetron [facility]) injection 4 mg  4 mg Intravenous Q6H PRN Athena Masse, MD      ? polyvinyl alcohol (LIQUIFILM TEARS) 1.4 % ophthalmic solution 1 drop  1 drop Both Eyes QID PRN Jordan Hawks, FNP      ? QUEtiapine (SEROQUEL) tablet 50 mg  50 mg Oral QHS Athena Masse, MD   50 mg at 09/23/21 2154  ? ? ? ?Discharge Medications: ?Please see discharge summary for a list of discharge medications. ? ?Relevant Imaging Results: ? ?Relevant Lab Results: ? ? ?Additional Information ?Hospice/Comfort Care. SS #: [phone] ? ?Clayborn Milnes E Valkyrie Guardiola, LCSW ? ? ? ? ?

## 2021-09-25 NOTE — TOC Progression Note (Signed)
Transition of Care (TOC) - Progression Note  ? ? ?Patient Details  ?Name: Marie [name] ?MRN: [id number] ?Date of Birth: October 20, 1950 ? ?Transition of Care (TOC) CM/SW Contact  ?Montserrath Madding E Savilla Turbyfill, LCSW ?Phone Number: ?09/25/2021, 10:26 AM ? ?Clinical Narrative:   Following for bed offers. ? ? ? ?  ?  ? ?Expected Discharge Plan and Services ?  ?  ?  ?  ?  ?                ?  ?  ?  ?  ?  ?  ?  ?  ?  ?  ? ? ?Social Determinants of Health (SDOH) Interventions ?  ? ?Readmission Risk Interventions ?   ? View : No data to display.  ?  ?  ?  ? ? ?
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
## 2021-09-25 NOTE — Plan of Care (Signed)
Patient remains confused. Viewed last night with fecal matter in her hand. Cleaned her up. Patient has word salad and what she says does not make sense. Slept most of the night-no pain indicators seen. ?Problem: Nutrition: ?Goal: Adequate nutrition will be maintained ?Outcome: Progressing ?  ?Problem: Elimination: ?Goal: Will not experience complications related to bowel motility ?Outcome: Progressing ?Goal: Will not experience complications related to urinary retention ?Outcome: Progressing ?  ?Problem: Pain Managment: ?Goal: General experience of comfort will improve ?Outcome: Progressing ?  ?Problem: Safety: ?Goal: Ability to remain free from injury will improve ?Outcome: Progressing ?  ?Problem: Skin Integrity: ?Goal: Risk for impaired skin integrity will decrease ?Outcome: Progressing ?  ?
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
## 2021-09-27 DIAGNOSIS — G309 Alzheimer's disease, unspecified: Secondary | ICD-10-CM | POA: Diagnosis not present

## 2021-09-27 DIAGNOSIS — F02818 Dementia in other diseases classified elsewhere, unspecified severity, with other behavioral disturbance: Secondary | ICD-10-CM | POA: Diagnosis not present
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
## 2021-09-28 DIAGNOSIS — F02818 Dementia in other diseases classified elsewhere, unspecified severity, with other behavioral disturbance: Secondary | ICD-10-CM | POA: Diagnosis not present

## 2021-09-28 DIAGNOSIS — G309 Alzheimer's disease, unspecified: Secondary | ICD-10-CM | POA: Diagnosis not present

## 2021-09-28 NOTE — TOC Progression Note (Addendum)
Transition of Care (TOC) - Progression Note  ? ? ?Patient Details  ?Name: Marie [name] ?MRN: [id number] ?Date of Birth: [birth year] ? ?Transition of Care (TOC) CM/SW Contact  ?Pete Pelt, RN ?Phone Number: ?09/28/2021, 10:01 AM ? ?Clinical Narrative:   No bed offers, RNCM re-sent bed referrals to facilities.  Will continue to monitor for bed offers. ? ?Addendum 5784:  No bed offers at this time. ? ?  ?  ? ?Expected Discharge Plan and Services ?  ?  ?  ?  ?  ?                ?  ?  ?  ?  ?  ?  ?  ?  ?  ?  ? ? ?Social Determinants of Health (SDOH) Interventions ?  ? ?Readmission Risk Interventions ?   ? View : No data to display.  ?  ?  ?  ? ? ?
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
## 2021-09-28 NOTE — Progress Notes (Signed)
Manufacturing engineer [facility]) Hospital Liaison Note ?  ?Family has chosen to pursue LTC placement with [facility] to follow. At this time, there are no bed offers for LTC. TOC continues seek placement for patient. [facility] will continue to follow through dispo. ?  ?Please do not hesitate to call with any hospice related questions.  ?  ?Thank you for the opportunity to participate in this patient's care. ?  ?Daphene Calamity, MSW ?[place]  ?[phone] ?

## 2021-09-29 DIAGNOSIS — D496 Neoplasm of unspecified behavior of brain: Secondary | ICD-10-CM | POA: Diagnosis not present

## 2021-09-29 DIAGNOSIS — G309 Alzheimer's disease, unspecified: Secondary | ICD-10-CM | POA: Diagnosis not present

## 2021-09-29 DIAGNOSIS — R627 Adult failure to thrive: Secondary | ICD-10-CM | POA: Diagnosis not present

## 2021-09-29 DIAGNOSIS — F02818 Dementia in other diseases classified elsewhere, unspecified severity, with other behavioral disturbance: Secondary | ICD-10-CM | POA: Diagnosis not present

## 2021-09-29 NOTE — Progress Notes (Signed)
?PROGRESS NOTE ? ? ? ?AMMA CREAR  YNW:[id number] DOB: [birth year] DOA: 09/20/2021 ?PCP: [name], MD ? ?Assessment & Plan: ?  ?Principal Problem: ?  Alzheimer's dementia with behavioral disturbance ([place]) ?Active Problems: ?  Agitation ?  Neoplasm of brain causing mass effect on adjacent structures ([place]) ?  Essential hypertension ?  CAD (coronary artery disease) ?  Involuntary commitment ?  Diabetes mellitus without complication ([place]) ? ?Failure to thrive: secondary to all below. Continue on comfort care only  ? ?Enlarging left temporal/parietal cortex brain mass: with marked increase in surrounding vasogenic edema and mass effect concerning rightward midline shift. Comfort care only  ? ?Alzheimer dementia: continue w/ comfort care only   ? ?Hx of CAD: comfort care only  ? ?DM2: likely poorly controlled. Comfort care only  ? ? ?DVT prophylaxis:  comfort care only  ?Code Status: DNR ?Family Communication: called pt's son, [name], but no answer so I left a voicemail  ?Disposition Plan: unclear ? ?Level of care: Med-Surg ? ?Status is: Inpatient ?Remains inpatient appropriate because: comfort care only  ? ? ?Consultants:  ?Hospice  ? ?Procedures:  ? ?Antimicrobials:  ? ? ?Subjective: ?Pt is confused and not answering questions appropriately  ? ?Objective: ?Vitals:  ? 09/26/21 0734 09/27/21 0758 09/28/21 0903 09/28/21 1940  ?BP: (!) 126/96 110/67 (!) 105/59 129/73  ?Pulse: 85 82 95 96  ?Resp: '16 16 16 18  '$ ?Temp: 97.8 ?F (36.6 ?C) 98.1 ?F (36.7 ?C) 97.8 ?F (36.6 ?C) 98.8 ?F (37.1 ?C)  ?TempSrc:    Oral  ?SpO2: 100% 99% 100% 99%  ?Weight:      ?Height:      ? ? ?Intake/Output Summary (Last 24 hours) at 09/29/2021 0814 ?Last data filed at 09/28/2021 2125 ?Gross per 24 hour  ?Intake 600 ml  ?Output 1200 ml  ?Net -600 ml  ? ?Filed Weights  ? 09/20/21 2129  ?Weight: 45.4 kg  ? ? ?Examination: ? ?General exam: Appears calm and comfortable  ?Respiratory system: Clear to auscultation. Respiratory effort  normal. ?Cardiovascular system: S1 & S2 +. No rubs, gallops or clicks.  ?Gastrointestinal system: Abdomen is nondistended, soft and nontender. Hypoactive bowel sounds heard. ?Central nervous system: Lethargic. Moves all extremities  ?Psychiatry: Judgement and insight appear poor. Flat mood and affect.  ? ? ? ?Data Reviewed: I have personally reviewed following labs and imaging studies ? ?CBC: ?No results for input(s): WBC, NEUTROABS, HGB, HCT, MCV, PLT in the last 168 hours. ?Basic Metabolic Panel: ?No results for input(s): NA, K, CL, CO2, GLUCOSE, BUN, CREATININE, CALCIUM, MG, PHOS in the last 168 hours. ?GFR: ?Estimated Creatinine Clearance: 43.2 mL/min (by C-G formula based on SCr of 0.88 mg/dL). ?Liver Function Tests: ?No results for input(s): AST, ALT, ALKPHOS, BILITOT, PROT, ALBUMIN in the last 168 hours. ?No results for input(s): LIPASE, AMYLASE in the last 168 hours. ?No results for input(s): AMMONIA in the last 168 hours. ?Coagulation Profile: ?No results for input(s): INR, PROTIME in the last 168 hours. ?Cardiac Enzymes: ?No results for input(s): CKTOTAL, CKMB, CKMBINDEX, TROPONINI in the last 168 hours. ?BNP (last 3 results) ?No results for input(s): PROBNP in the last 8760 hours. ?HbA1C: ?No results for input(s): HGBA1C in the last 72 hours. ?CBG: ?Recent Labs  ?Lab 09/22/21 ?1030  ?GLUCAP 135*  ? ?Lipid Profile: ?No results for input(s): CHOL, HDL, LDLCALC, TRIG, CHOLHDL, LDLDIRECT in the last 72 hours. ?Thyroid Function Tests: ?No results for input(s): TSH, T4TOTAL, FREET4, T3FREE, THYROIDAB in the last 72 hours. ?Anemia  Panel: ?No results for input(s): VITAMINB12, FOLATE, FERRITIN, TIBC, IRON, RETICCTPCT in the last 72 hours. ?Sepsis Labs: ?No results for input(s): PROCALCITON, LATICACIDVEN in the last 168 hours. ? ?No results found for this or any previous visit (from the past 240 hour(s)).  ? ? ? ? ? ?Radiology Studies: ?No results found. ? ? ? ? ? ?Scheduled Meds: ? QUEtiapine  50 mg Oral QHS   ? ?Continuous Infusions: ? ? LOS: 8 days  ? ? ?Time spent: 15 mins  ? ? ? ?Wyvonnia Dusky, MD ?[facility] ?Pager [phone] ? ?If 7PM-7AM, please contact night-coverage ?09/29/2021, 8:14 AM  ? ?

## 2021-09-29 NOTE — Progress Notes (Addendum)
Manufacturing engineer [facility]) Hospital Liaison Note ?  ?Per [facility] & MD request, patient is being reevaluated for [facility] admission. Approval for [employer] is determined by [facility] MD. Once [facility] MD has determined [facility] eligibility, [facility] will update hospital staff. Eligibility is pending. ? ?Addendum: ?Patient eligible for hospice services, but will need to followed for eligibility for Hospice Home as patient is not meeting eligibility at this time. TOC aware. ?  ?Please do not hesitate to call with any hospice related questions.  ?  ?Thank you for the opportunity to participate in this patient's care. ?  ?Daphene Calamity, MSW ?[place]  ?[phone] ?

## 2021-09-29 NOTE — Progress Notes (Signed)
Per patient's spouse, he has chosen [facility] [facility] and [facility] should patient pass during her hospital stay.  [place] [address] [place] [address] and phone [phone].   ?
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
## 2021-09-30 DIAGNOSIS — D496 Neoplasm of unspecified behavior of brain: Secondary | ICD-10-CM | POA: Diagnosis not present

## 2021-09-30 DIAGNOSIS — F02818 Dementia in other diseases classified elsewhere, unspecified severity, with other behavioral disturbance: Secondary | ICD-10-CM | POA: Diagnosis not present

## 2021-09-30 DIAGNOSIS — I2510 Atherosclerotic heart disease of native coronary artery without angina pectoris: Secondary | ICD-10-CM | POA: Diagnosis not present

## 2021-09-30 DIAGNOSIS — G309 Alzheimer's disease, unspecified: Secondary | ICD-10-CM | POA: Diagnosis not present

## 2021-09-30 NOTE — Progress Notes (Signed)
Manufacturing engineer [facility]) Hospital Liaison Note ?  ?Family continues to seek LTC availability. At this time, there are no bed offers for LTC. TOC continues seek placement for patient. [facility] will continue to follow through dispo.  ?  ?Please do not hesitate to call with any hospice related questions.  ?  ?Thank you for the opportunity to participate in this patient's care. ?  ?Daphene Calamity, MSW ?[place]  ?[phone] ?
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
## 2021-09-30 NOTE — Progress Notes (Signed)
?PROGRESS NOTE ? ? ? ?Marie [name]  [id number]:[id number] DOB: August 18, 1950 DOA: 09/20/2021 ?PCP: [name], MD ? ?Assessment & Plan: ?  ?Principal Problem: ?  Alzheimer's dementia with behavioral disturbance ([place]) ?Active Problems: ?  Agitation ?  Neoplasm of brain causing mass effect on adjacent structures ([place]) ?  Essential hypertension ?  CAD (coronary artery disease) ?  Involuntary commitment ?  Diabetes mellitus without complication ([place]) ? ?Failure to thrive: secondary to all below. Continue w/ comfort care  ? ?Enlarging left temporal/parietal cortex brain mass: with marked increase in surrounding vasogenic edema and mass effect concerning rightward midline shift. Continue w/ comfort care only ? ?Alzheimer dementia: continue w/ comfort care only   ? ?Hx of CAD: continue w/ comfort care  ? ?DM2: likely poorly controlled. Comfort care only  ? ? ?DVT prophylaxis:  comfort care only  ?Code Status: DNR ?Family Communication: called pt's son, [name], again but no answer so I left a voicemail  ?Disposition Plan: unclear ? ?Level of care: Med-Surg ? ?Status is: Inpatient ?Remains inpatient appropriate because: comfort care only  ? ? ?Consultants:  ?Hospice  ? ?Procedures:  ? ?Antimicrobials:  ? ? ?Subjective: ?Pt is lethargic & not answering questions ? ?Objective: ?Vitals:  ? 09/27/21 0758 09/28/21 0903 09/28/21 1940 09/29/21 0843  ?BP: 110/67 (!) 105/59 129/73 123/75  ?Pulse: 82 95 96 80  ?Resp: '16 16 18 17  '$ ?Temp: 98.1 ?F (36.7 ?C) 97.8 ?F (36.6 ?C) 98.8 ?F (37.1 ?C) 98.6 ?F (37 ?C)  ?TempSrc:   Oral   ?SpO2: 99% 100% 99% 100%  ?Weight:      ?Height:      ? ? ?Intake/Output Summary (Last 24 hours) at 09/30/2021 0733 ?Last data filed at 09/29/2021 1411 ?Gross per 24 hour  ?Intake 120 ml  ?Output --  ?Net 120 ml  ? ?Filed Weights  ? 09/20/21 2129  ?Weight: 45.4 kg  ? ? ?Examination: ? ?General exam:Appears comfortable  ?Respiratory system: diminished breath sounds b/l ?Cardiovascular system: S1/S2+. No rubs or gallops   ?Gastrointestinal system: Abd is soft, NT, ND & hypoactive bowel sounds  ?Central nervous system: Lethargic  ?Psychiatry: judgement and insight appears poor   ? ? ? ?Data Reviewed: I have personally reviewed following labs and imaging studies ? ?CBC: ?No results for input(s): WBC, NEUTROABS, HGB, HCT, MCV, PLT in the last 168 hours. ?Basic Metabolic Panel: ?No results for input(s): NA, K, CL, CO2, GLUCOSE, BUN, CREATININE, CALCIUM, MG, PHOS in the last 168 hours. ?GFR: ?Estimated Creatinine Clearance: 43.2 mL/min (by C-G formula based on SCr of 0.88 mg/dL). ?Liver Function Tests: ?No results for input(s): AST, ALT, ALKPHOS, BILITOT, PROT, ALBUMIN in the last 168 hours. ?No results for input(s): LIPASE, AMYLASE in the last 168 hours. ?No results for input(s): AMMONIA in the last 168 hours. ?Coagulation Profile: ?No results for input(s): INR, PROTIME in the last 168 hours. ?Cardiac Enzymes: ?No results for input(s): CKTOTAL, CKMB, CKMBINDEX, TROPONINI in the last 168 hours. ?BNP (last 3 results) ?No results for input(s): PROBNP in the last 8760 hours. ?HbA1C: ?No results for input(s): HGBA1C in the last 72 hours. ?CBG: ?No results for input(s): GLUCAP in the last 168 hours. ? ?Lipid Profile: ?No results for input(s): CHOL, HDL, LDLCALC, TRIG, CHOLHDL, LDLDIRECT in the last 72 hours. ?Thyroid Function Tests: ?No results for input(s): TSH, T4TOTAL, FREET4, T3FREE, THYROIDAB in the last 72 hours. ?Anemia Panel: ?No results for input(s): VITAMINB12, FOLATE, FERRITIN, TIBC, IRON, RETICCTPCT in the last 72 hours. ?Sepsis  Labs: ?No results for input(s): PROCALCITON, LATICACIDVEN in the last 168 hours. ? ?No results found for this or any previous visit (from the past 240 hour(s)).  ? ? ? ? ? ?Radiology Studies: ?No results found. ? ? ? ? ? ?Scheduled Meds: ? QUEtiapine  50 mg Oral QHS  ? ?Continuous Infusions: ? ? LOS: 9 days  ? ? ?Time spent: 10 mins  ? ? ? ?Wyvonnia Dusky, MD ?[facility] ?Pager [phone]  [phone] ? ?If 7PM-7AM, please contact night-coverage ?09/30/2021, 7:33 AM  ? ?

## 2021-10-01 NOTE — Progress Notes (Signed)
Manufacturing engineer [facility]) Hospital Liaison Note ?  ?Upon chart review, family continues to seek LTC availability and have begun Medicaid. At this time, there are no bed offers for LTC. TOC continues seek placement for patient. ACC will continue to follow through dispo.  ?  ?Please do not hesitate to call with any hospice related questions.  ?  ?Thank you for the opportunity to participate in this patient's care. ?  ?Daphene Calamity, MSW ?[place]  ?[phone] ?

## 2021-10-01 NOTE — Progress Notes (Signed)
?PROGRESS NOTE ? ? ? ?Marie [name]  [id number]:[id number] DOB: 1950-09-01 DOA: 09/20/2021 ?PCP: [name], MD ? ?Assessment & Plan: ?  ?Principal Problem: ?  Alzheimer's dementia with behavioral disturbance ([place]) ?Active Problems: ?  Agitation ?  Neoplasm of brain causing mass effect on adjacent structures ([place]) ?  Essential hypertension ?  CAD (coronary artery disease) ?  Involuntary commitment ?  Diabetes mellitus without complication ([place]) ? ?Failure to thrive: secondary to all below. Continue w/ comfort care only  ? ?Enlarging left temporal/parietal cortex brain mass: with marked increase in surrounding vasogenic edema and mass effect concerning rightward midline shift. Continue w/ comfort care  ? ?Alzheimer dementia: continue w/ comfort care    ? ?Hx of CAD: continue w/ comfort care  ? ?DM2: likely poorly controlled. Comfort care only  ? ? ?DVT prophylaxis:  comfort care only  ?Code Status: DNR ?Family Communication: called pt's husband, [name], and answer his questions  ?Disposition Plan: unclear ? ?Level of care: Med-Surg ? ?Status is: Inpatient ?Remains inpatient appropriate because: comfort care only  ? ? ?Consultants:  ?Hospice  ? ?Procedures:  ? ?Antimicrobials:  ? ? ?Subjective: ?Pt is lethargic  ? ?Objective: ?Vitals:  ? 09/28/21 0903 09/28/21 1940 09/29/21 0843 10/01/21 2703  ?BP: (!) 105/59 129/73 123/75 123/67  ?Pulse: 95 96 80 96  ?Resp: '16 18 17 18  '$ ?Temp: 97.8 ?F (36.6 ?C) 98.8 ?F (37.1 ?C) 98.6 ?F (37 ?C) 98.6 ?F (37 ?C)  ?TempSrc:  Oral  Oral  ?SpO2: 100% 99% 100% 100%  ?Weight:      ?Height:      ? ? ?Intake/Output Summary (Last 24 hours) at 10/01/2021 0646 ?Last data filed at 09/30/2021 1300 ?Gross per 24 hour  ?Intake 0 ml  ?Output --  ?Net 0 ml  ? ?Filed Weights  ? 09/20/21 2129  ?Weight: 45.4 kg  ? ? ?Examination: ? ?General exam: Appears calm & comfortable  ?Respiratory system: decreased breath sounds b/l  ?Cardiovascular system: S1 & S2+. No rubs or clicks  ?Gastrointestinal system: Abd is  soft, NT, ND & hypoactive bowel sounds  ?Central nervous system: lethargic  ?Psychiatry: judgement and insight appears poor  ? ? ? ?Data Reviewed: I have personally reviewed following labs and imaging studies ? ?CBC: ?No results for input(s): WBC, NEUTROABS, HGB, HCT, MCV, PLT in the last 168 hours. ?Basic Metabolic Panel: ?No results for input(s): NA, K, CL, CO2, GLUCOSE, BUN, CREATININE, CALCIUM, MG, PHOS in the last 168 hours. ?GFR: ?Estimated Creatinine Clearance: 43.2 mL/min (by C-G formula based on SCr of 0.88 mg/dL). ?Liver Function Tests: ?No results for input(s): AST, ALT, ALKPHOS, BILITOT, PROT, ALBUMIN in the last 168 hours. ?No results for input(s): LIPASE, AMYLASE in the last 168 hours. ?No results for input(s): AMMONIA in the last 168 hours. ?Coagulation Profile: ?No results for input(s): INR, PROTIME in the last 168 hours. ?Cardiac Enzymes: ?No results for input(s): CKTOTAL, CKMB, CKMBINDEX, TROPONINI in the last 168 hours. ?BNP (last 3 results) ?No results for input(s): PROBNP in the last 8760 hours. ?HbA1C: ?No results for input(s): HGBA1C in the last 72 hours. ?CBG: ?No results for input(s): GLUCAP in the last 168 hours. ? ?Lipid Profile: ?No results for input(s): CHOL, HDL, LDLCALC, TRIG, CHOLHDL, LDLDIRECT in the last 72 hours. ?Thyroid Function Tests: ?No results for input(s): TSH, T4TOTAL, FREET4, T3FREE, THYROIDAB in the last 72 hours. ?Anemia Panel: ?No results for input(s): VITAMINB12, FOLATE, FERRITIN, TIBC, IRON, RETICCTPCT in the last 72 hours. ?Sepsis Labs: ?No  results for input(s): PROCALCITON, LATICACIDVEN in the last 168 hours. ? ?No results found for this or any previous visit (from the past 240 hour(s)).  ? ? ? ? ? ?Radiology Studies: ?No results found. ? ? ? ? ? ?Scheduled Meds: ? QUEtiapine  50 mg Oral QHS  ? ?Continuous Infusions: ? ? LOS: 10 days  ? ? ?Time spent: 10 mins  ? ? ? ?Wyvonnia Dusky, MD ?[facility] ?Pager [phone] ? ?If 7PM-7AM, please contact  night-coverage ?10/01/2021, 6:46 AM  ? ?

## 2021-10-02 NOTE — Progress Notes (Signed)
?PROGRESS NOTE ? ? ? ?Marie [name]  [id number]:[id number] DOB: 1951-02-07 DOA: 09/20/2021 ?PCP: [name], MD ? ?Assessment & Plan: ?  ?Principal Problem: ?  Alzheimer's dementia with behavioral disturbance ([place]) ?Active Problems: ?  Agitation ?  Neoplasm of brain causing mass effect on adjacent structures ([place]) ?  Essential hypertension ?  CAD (coronary artery disease) ?  Involuntary commitment ?  Diabetes mellitus without complication ([place]) ? ?Failure to thrive: secondary to all below. Continue w/ comfort care only  ? ?Enlarging left temporal/parietal cortex brain mass: with marked increase in surrounding vasogenic edema and mass effect concerning rightward midline shift. Continue w/ comfort care  ? ?Alzheimer dementia: continue w/ comfort care    ? ?Hx of CAD: continue w/ comfort care only   ? ?DM2: likely poorly controlled. Comfort care only   ? ? ?DVT prophylaxis:  comfort care only  ?Code Status: DNR ?Family Communication: called pt's husband, Marie [name], but no answer so I left a voicemail  ?Disposition Plan: unclear ? ?Level of care: Med-Surg ? ?Status is: Inpatient ?Remains inpatient appropriate because: comfort care only  ? ? ?Consultants:  ?Hospice  ? ?Procedures:  ? ?Antimicrobials:  ? ? ?Subjective: ?Pt is lethargic ? ?Objective: ?Vitals:  ? 09/29/21 0843 10/01/21 0639 10/01/21 0830 10/02/21 0518  ?BP: 123/75 123/67 (!) 104/59 (!) 98/57  ?Pulse: 80 96 93 73  ?Resp: '17 18 17 16  '$ ?Temp: 98.6 ?F (37 ?C) 98.6 ?F (37 ?C) 97.7 ?F (36.5 ?C) 99.3 ?F (37.4 ?C)  ?TempSrc:  Oral  Axillary  ?SpO2: 100% 100% (!) 87% 98%  ?Weight:      ?Height:      ? ?No intake or output data in the 24 hours ending 10/02/21 0746 ? ?Filed Weights  ? 09/20/21 2129  ?Weight: 45.4 kg  ? ? ?Examination: ? ?General exam: Appears calm & comfortable ?Respiratory system: diminished breath sounds b/l ?Cardiovascular system: S1/S2+. No rubs or clicks  ?Gastrointestinal system: Abd is soft, NT, ND & hypoactive bowel sounds ?Central nervous  system: lethargic  ?Psychiatry: judgement and insight appears poor  ? ? ? ?Data Reviewed: I have personally reviewed following labs and imaging studies ? ?CBC: ?No results for input(s): WBC, NEUTROABS, HGB, HCT, MCV, PLT in the last 168 hours. ?Basic Metabolic Panel: ?No results for input(s): NA, K, CL, CO2, GLUCOSE, BUN, CREATININE, CALCIUM, MG, PHOS in the last 168 hours. ?GFR: ?Estimated Creatinine Clearance: 43.2 mL/min (by C-G formula based on SCr of 0.88 mg/dL). ?Liver Function Tests: ?No results for input(s): AST, ALT, ALKPHOS, BILITOT, PROT, ALBUMIN in the last 168 hours. ?No results for input(s): LIPASE, AMYLASE in the last 168 hours. ?No results for input(s): AMMONIA in the last 168 hours. ?Coagulation Profile: ?No results for input(s): INR, PROTIME in the last 168 hours. ?Cardiac Enzymes: ?No results for input(s): CKTOTAL, CKMB, CKMBINDEX, TROPONINI in the last 168 hours. ?BNP (last 3 results) ?No results for input(s): PROBNP in the last 8760 hours. ?HbA1C: ?No results for input(s): HGBA1C in the last 72 hours. ?CBG: ?No results for input(s): GLUCAP in the last 168 hours. ? ?Lipid Profile: ?No results for input(s): CHOL, HDL, LDLCALC, TRIG, CHOLHDL, LDLDIRECT in the last 72 hours. ?Thyroid Function Tests: ?No results for input(s): TSH, T4TOTAL, FREET4, T3FREE, THYROIDAB in the last 72 hours. ?Anemia Panel: ?No results for input(s): VITAMINB12, FOLATE, FERRITIN, TIBC, IRON, RETICCTPCT in the last 72 hours. ?Sepsis Labs: ?No results for input(s): PROCALCITON, LATICACIDVEN in the last 168 hours. ? ?No results found for this  or any previous visit (from the past 240 hour(s)).  ? ? ? ? ? ?Radiology Studies: ?No results found. ? ? ? ? ? ?Scheduled Meds: ? QUEtiapine  50 mg Oral QHS  ? ?Continuous Infusions: ? ? LOS: 11 days  ? ? ?Time spent: 10 mins  ? ? ? ?Wyvonnia Dusky, MD ?[facility] ?Pager [phone] ? ?If 7PM-7AM, please contact night-coverage ?10/02/2021, 7:46 AM  ? ?

## 2021-10-03 DIAGNOSIS — G309 Alzheimer's disease, unspecified: Secondary | ICD-10-CM | POA: Diagnosis not present

## 2021-10-03 DIAGNOSIS — R627 Adult failure to thrive: Secondary | ICD-10-CM | POA: Diagnosis not present

## 2021-10-03 DIAGNOSIS — D496 Neoplasm of unspecified behavior of brain: Secondary | ICD-10-CM | POA: Diagnosis not present

## 2021-10-03 DIAGNOSIS — F02818 Dementia in other diseases classified elsewhere, unspecified severity, with other behavioral disturbance: Secondary | ICD-10-CM | POA: Diagnosis not present

## 2021-10-03 NOTE — Progress Notes (Signed)
?PROGRESS NOTE ? ? ? ?Marie [name]  [id number]:[id number] DOB: Feb 06, 1951 DOA: 09/20/2021 ?PCP: Marie Pinto, MD ? ?Assessment & Plan: ?  ?Principal Problem: ?  Alzheimer's dementia with behavioral disturbance ([place]) ?Active Problems: ?  Agitation ?  Neoplasm of brain causing mass effect on adjacent structures ([place]) ?  Essential hypertension ?  CAD (coronary artery disease) ?  Involuntary commitment ?  Diabetes mellitus without complication ([place]) ? ?Failure to thrive: secondary to all below. Continue w/ comfort care only   ? ?Enlarging left temporal/parietal cortex brain mass: with marked increase in surrounding vasogenic edema and mass effect concerning rightward midline shift. Continue w/ comfort care  ? ?Alzheimer dementia: Talking today but not making any sense. Continue w/ comfort care  ? ?Hx of CAD: continue w/ comfort care only   ? ?DM2: likely poorly controlled. Comfort care only   ? ? ?DVT prophylaxis:  comfort care only  ?Code Status: DNR ?Family Communication: called pt's husband again today, Marie [name], but no answer so I left a voicemail  ?Disposition Plan: unclear ? ?Level of care: Med-Surg ? ?Status is: Inpatient ?Remains inpatient appropriate because: comfort care only  ? ? ?Consultants:  ?Hospice  ? ?Procedures:  ? ?Antimicrobials:  ? ? ?Subjective: ?Pt is awake and talking but not making any sense ? ?Objective: ?Vitals:  ? 10/01/21 0830 10/02/21 0518 10/02/21 0759 10/02/21 2024  ?BP: (!) 104/59 (!) 98/57 (!) 108/58 109/74  ?Pulse: 93 73 89 (!) 107  ?Resp: '17 16 19 20  '$ ?Temp: 97.7 ?F (36.5 ?C) 99.3 ?F (37.4 ?C) 97.7 ?F (36.5 ?C) 98.6 ?F (37 ?C)  ?TempSrc:  Axillary Axillary Oral  ?SpO2: (!) 87% 98% 99% 99%  ?Weight:      ?Height:      ? ? ?Intake/Output Summary (Last 24 hours) at 10/03/2021 0729 ?Last data filed at 10/02/2021 1700 ?Gross per 24 hour  ?Intake 480 ml  ?Output --  ?Net 480 ml  ? ? ?Filed Weights  ? 09/20/21 2129  ?Weight: 45.4 kg  ? ? ?Examination: ? ?General exam: Appears comfortable but  confused  ?Respiratory system: decreased breath sounds b/l  ?Cardiovascular system: S1 & S2+. No rubs or gallops  ?Gastrointestinal system: abd is soft, NT, ND & hypoactive bowel sounds  ?Central nervous system: awake and alert. Moves all extremities  ?Psychiatry: judgement and insight appear poor  ? ? ? ?Data Reviewed: I have personally reviewed following labs and imaging studies ? ?CBC: ?No results for input(s): WBC, NEUTROABS, HGB, HCT, MCV, PLT in the last 168 hours. ?Basic Metabolic Panel: ?No results for input(s): NA, K, CL, CO2, GLUCOSE, BUN, CREATININE, CALCIUM, MG, PHOS in the last 168 hours. ?GFR: ?Estimated Creatinine Clearance: 43.2 mL/min (by C-G formula based on SCr of 0.88 mg/dL). ?Liver Function Tests: ?No results for input(s): AST, ALT, ALKPHOS, BILITOT, PROT, ALBUMIN in the last 168 hours. ?No results for input(s): LIPASE, AMYLASE in the last 168 hours. ?No results for input(s): AMMONIA in the last 168 hours. ?Coagulation Profile: ?No results for input(s): INR, PROTIME in the last 168 hours. ?Cardiac Enzymes: ?No results for input(s): CKTOTAL, CKMB, CKMBINDEX, TROPONINI in the last 168 hours. ?BNP (last 3 results) ?No results for input(s): PROBNP in the last 8760 hours. ?HbA1C: ?No results for input(s): HGBA1C in the last 72 hours. ?CBG: ?No results for input(s): GLUCAP in the last 168 hours. ? ?Lipid Profile: ?No results for input(s): CHOL, HDL, LDLCALC, TRIG, CHOLHDL, LDLDIRECT in the last 72 hours. ?Thyroid Function Tests: ?No results for  input(s): TSH, T4TOTAL, FREET4, T3FREE, THYROIDAB in the last 72 hours. ?Anemia Panel: ?No results for input(s): VITAMINB12, FOLATE, FERRITIN, TIBC, IRON, RETICCTPCT in the last 72 hours. ?Sepsis Labs: ?No results for input(s): PROCALCITON, LATICACIDVEN in the last 168 hours. ? ?No results found for this or any previous visit (from the past 240 hour(s)).  ? ? ? ? ? ?Radiology Studies: ?No results found. ? ? ? ? ? ?Scheduled Meds: ? QUEtiapine  50 mg Oral QHS   ? ?Continuous Infusions: ? ? LOS: 12 days  ? ? ?Time spent: 10 mins  ? ? ? ?Marie Dusky, MD ?[facility] ?Pager [phone] ? ?If 7PM-7AM, please contact night-coverage ?10/03/2021, 7:29 AM  ? ?

## 2021-10-03 NOTE — Progress Notes (Signed)
Manufacturing engineer (ACC) ? ?ACC will continue to follow peripherally until a d/c destination has been determined. ? ?Please reach out if we can assist. ? ?Venia Carbon BSN, RN ?[facility] Liaison  ?

## 2021-10-04 NOTE — Progress Notes (Signed)
Manufacturing engineer [facility]) Hospital Liaison Note ?  ?RNCM/Elena continues to seek LTC availability and continue to assist family with  Medicaid application. At this time, there are no bed offers for LTC. TOC continues seek placement for patient. ACC will continue to follow through dispo.  ?  ?Please do not hesitate to call with any hospice related questions.  ?  ?Thank you for the opportunity to participate in this patient's care. ?  ?Daphene Calamity, MSW ?[place]  ?[phone] ?

## 2021-10-04 NOTE — TOC Progression Note (Addendum)
Transition of Care (TOC) - Progression Note  ? ? ?Patient Details  ?Name: Marie [name] ?MRN: [id number] ?Date of Birth: 25-Jun-1951 ? ?Transition of Care (TOC) CM/SW Contact  ?Pete Pelt, RN ?Phone Number: ?10/04/2021, 11:26 AM ? ?Clinical Narrative:   [facility] financial services checking Medicaid application and caseworker information with  ?DSS, daughter would not provide to [facility].  TOC to follow. ? ?Addendum 1546hours:  RNCM left message with Alinda Dooms, Medicaid Caseworker [phone].  [name] financial services also reached out.  TOC to follow. ? ?  ?  ? ?Expected Discharge Plan and Services ?  ?  ?  ?  ?  ?                ?  ?  ?  ?  ?  ?  ?  ?  ?  ?  ? ? ?Social Determinants of Health (SDOH) Interventions ?  ? ?Readmission Risk Interventions ?   ? View : No data to display.  ?  ?  ?  ? ? ?

## 2021-10-04 NOTE — Progress Notes (Signed)
?PROGRESS NOTE ? ? ? ?Marie [name]  PPI:[id number] DOB: [birth year] DOA: 09/20/2021 ?PCP: [name], MD ? ?Assessment & Plan: ?  ?Principal Problem: ?  Alzheimer's dementia with behavioral disturbance ([place]) ?Active Problems: ?  Agitation ?  Neoplasm of brain causing mass effect on adjacent structures ([place]) ?  Essential hypertension ?  CAD (coronary artery disease) ?  Involuntary commitment ?  Diabetes mellitus without complication ([place]) ? ?Failure to thrive: secondary to all below. Continue w/ comfort care only   ? ?Enlarging left temporal/parietal cortex brain mass: with marked increase in surrounding vasogenic edema and mass effect concerning rightward midline shift. Continue w/ comfort care  ? ?Alzheimer dementia: talking but not making any sense. Continue w/ comfort care  ? ?Hx of CAD: continue w/ comfort care only   ? ?DM2: likely poorly controlled. Comfort care only   ? ? ?DVT prophylaxis:  comfort care only  ?Code Status: DNR ?Family Communication:  ?Disposition Plan: unclear ? ?Level of care: Med-Surg ? ?Status is: Inpatient ?Remains inpatient appropriate because: comfort care only  ? ? ?Consultants:  ?Hospice  ? ?Procedures:  ? ?Antimicrobials:  ? ? ?Subjective: ?Pt is unable to answer questions appropriately  ? ?Objective: ?Vitals:  ? 10/02/21 0518 10/02/21 0630 10/02/21 2024 10/04/21 1601  ?BP: (!) 98/57 (!) 108/58 109/74 (!) 97/53  ?Pulse: 73 89 (!) 107 93  ?Resp: '16 19 20 16  '$ ?Temp: 99.3 ?F (37.4 ?C) 97.7 ?F (36.5 ?C) 98.6 ?F (37 ?C) 98.9 ?F (37.2 ?C)  ?TempSrc: Axillary Axillary Oral   ?SpO2: 98% 99% 99% 98%  ?Weight:      ?Height:      ? ? ?Intake/Output Summary (Last 24 hours) at 10/04/2021 0735 ?Last data filed at 10/03/2021 1700 ?Gross per 24 hour  ?Intake 596 ml  ?Output --  ?Net 596 ml  ? ? ?Filed Weights  ? 09/20/21 2129  ?Weight: 45.4 kg  ? ? ?Examination: ? ?General exam: Appears confused  ?Respiratory system: diminished breath sounds b/l ?Cardiovascular system: S1/S2+. No rubs or  gallops ?Gastrointestinal system: abd is soft, NT, ND & hypoactive bowel sounds  ?Central nervous system: moves all extremities  ?Psychiatry: judgement and insight appears poor  ? ? ? ?Data Reviewed: I have personally reviewed following labs and imaging studies ? ?CBC: ?No results for input(s): WBC, NEUTROABS, HGB, HCT, MCV, PLT in the last 168 hours. ?Basic Metabolic Panel: ?No results for input(s): NA, K, CL, CO2, GLUCOSE, BUN, CREATININE, CALCIUM, MG, PHOS in the last 168 hours. ?GFR: ?Estimated Creatinine Clearance: 43.2 mL/min (by C-G formula based on SCr of 0.88 mg/dL). ?Liver Function Tests: ?No results for input(s): AST, ALT, ALKPHOS, BILITOT, PROT, ALBUMIN in the last 168 hours. ?No results for input(s): LIPASE, AMYLASE in the last 168 hours. ?No results for input(s): AMMONIA in the last 168 hours. ?Coagulation Profile: ?No results for input(s): INR, PROTIME in the last 168 hours. ?Cardiac Enzymes: ?No results for input(s): CKTOTAL, CKMB, CKMBINDEX, TROPONINI in the last 168 hours. ?BNP (last 3 results) ?No results for input(s): PROBNP in the last 8760 hours. ?HbA1C: ?No results for input(s): HGBA1C in the last 72 hours. ?CBG: ?No results for input(s): GLUCAP in the last 168 hours. ? ?Lipid Profile: ?No results for input(s): CHOL, HDL, LDLCALC, TRIG, CHOLHDL, LDLDIRECT in the last 72 hours. ?Thyroid Function Tests: ?No results for input(s): TSH, T4TOTAL, FREET4, T3FREE, THYROIDAB in the last 72 hours. ?Anemia Panel: ?No results for input(s): VITAMINB12, FOLATE, FERRITIN, TIBC, IRON, RETICCTPCT in the last 72  hours. ?Sepsis Labs: ?No results for input(s): PROCALCITON, LATICACIDVEN in the last 168 hours. ? ?No results found for this or any previous visit (from the past 240 hour(s)).  ? ? ? ? ? ?Radiology Studies: ?No results found. ? ? ? ? ? ?Scheduled Meds: ? QUEtiapine  50 mg Oral QHS  ? ?Continuous Infusions: ? ? LOS: 13 days  ? ? ?Time spent: 10 mins  ? ? ? ?Wyvonnia Dusky, MD ?[facility] ?Pager [phone] ? ?If 7PM-7AM, please contact night-coverage ?10/04/2021, 7:35 AM  ? ?

## 2021-10-05 MED ORDER — LORAZEPAM 2 MG/ML IJ SOLN: 0.5000 mg | INTRAMUSCULAR | Status: DC | PRN

## 2021-10-05 MED ORDER — MORPHINE SULFATE 10 MG/5ML PO SOLN
5.0000 mg | ORAL | Status: DC | PRN
  Administered 2021-10-15: 5 mg via SUBLINGUAL
  Filled 2021-10-05: qty 5

## 2021-10-05 MED ORDER — ALPRAZOLAM 0.5 MG PO TABS
0.5000 mg | ORAL_TABLET | Freq: Once | ORAL | Status: AC
  Administered 2021-10-05: 0.5 mg via ORAL
  Filled 2021-10-05: qty 1

## 2021-10-05 MED ORDER — HYDROXYZINE HCL 25 MG PO TABS
25.0000 mg | ORAL_TABLET | Freq: Three times a day (TID) | ORAL | Status: DC | PRN
  Administered 2021-10-05 – 2021-11-20 (×10): 25 mg via ORAL
  Filled 2021-10-05 (×11): qty 1

## 2021-10-05 NOTE — Progress Notes (Signed)
Manufacturing engineer [facility]) Hospital Liaison Note ?  ?RNCM/Elena continues to seek LTC placement .At this time, there are no bed offers for LTC. TOC continues seek placement for patient. ACC will continue to follow through dispo.  ?  ?Please do not hesitate to call with any hospice related questions.  ?  ?Thank you for the opportunity to participate in this patient's care. ?  ?Daphene Calamity, MSW ?[place]  ?[phone] ?

## 2021-10-05 NOTE — Progress Notes (Signed)
?PROGRESS NOTE ? ? ? ?HENRETTA [name]  XYI:[id number] DOB: 1950-07-18 DOA: 09/20/2021 ?PCP: [name], MD ? ?Assessment & Plan: ?  ?Principal Problem: ?  Alzheimer's dementia with behavioral disturbance ([place]) ?Active Problems: ?  Agitation ?  Neoplasm of brain causing mass effect on adjacent structures ([place]) ?  Essential hypertension ?  CAD (coronary artery disease) ?  Involuntary commitment ?  Diabetes mellitus without complication ([place]) ? ?Failure to thrive: secondary to all below. Continue w/ comfort care only   ? ?Enlarging left temporal/parietal cortex brain mass: with marked increase in surrounding vasogenic edema and mass effect concerning rightward midline shift. Continue w/ comfort care  ? ?Alzheimer dementia: talking but not making any sense. Continue w/ comfort care  ? ?Hx of CAD: continue w/ comfort care    ? ?DM2: likely poorly controlled. Comfort care only  ? ? ?DVT prophylaxis:  comfort care only  ?Code Status: DNR ?Family Communication: discussed pt's care w/ pt's husband, Marie [name], and answered his questions ?Disposition Plan: unclear ? ?Level of care: Med-Surg ? ?Status is: Inpatient ?Remains inpatient appropriate because: comfort care only, CM is still working placement   ? ? ?Consultants:  ?Hospice  ? ?Procedures:  ? ?Antimicrobials:  ? ? ?Subjective: ?Pt is sleeping comfortably in bed ? ?Objective: ?Vitals:  ? 10/02/21 0759 10/02/21 2024 10/04/21 2707 10/04/21 0737  ?BP: (!) 108/58 109/74 (!) 97/53 106/62  ?Pulse: 89 (!) 107 93 86  ?Resp: '19 20 16 20  '$ ?Temp: 97.7 ?F (36.5 ?C) 98.6 ?F (37 ?C) 98.9 ?F (37.2 ?C) 99 ?F (37.2 ?C)  ?TempSrc: Axillary Oral  Axillary  ?SpO2: 99% 99% 98% 97%  ?Weight:      ?Height:      ? ? ?Intake/Output Summary (Last 24 hours) at 10/05/2021 0643 ?Last data filed at 10/04/2021 1850 ?Gross per 24 hour  ?Intake 960 ml  ?Output --  ?Net 960 ml  ? ? ?Filed Weights  ? 09/20/21 2129  ?Weight: 45.4 kg  ? ? ?Examination: ? ?General exam: Appears confused  ?Respiratory  system: decreased breath sounds b/l ?Cardiovascular system: S1 & S2+. No rubs or clicks  ?Gastrointestinal system: abd is soft, NT, ND & hypoactive bowel sounds  ?Central nervous system: moves all extremities  ?Psychiatry: judgement and insight appears poor  ? ? ? ?Data Reviewed: I have personally reviewed following labs and imaging studies ? ?CBC: ?No results for input(s): WBC, NEUTROABS, HGB, HCT, MCV, PLT in the last 168 hours. ?Basic Metabolic Panel: ?No results for input(s): NA, K, CL, CO2, GLUCOSE, BUN, CREATININE, CALCIUM, MG, PHOS in the last 168 hours. ?GFR: ?Estimated Creatinine Clearance: 43.2 mL/min (by C-G formula based on SCr of 0.88 mg/dL). ?Liver Function Tests: ?No results for input(s): AST, ALT, ALKPHOS, BILITOT, PROT, ALBUMIN in the last 168 hours. ?No results for input(s): LIPASE, AMYLASE in the last 168 hours. ?No results for input(s): AMMONIA in the last 168 hours. ?Coagulation Profile: ?No results for input(s): INR, PROTIME in the last 168 hours. ?Cardiac Enzymes: ?No results for input(s): CKTOTAL, CKMB, CKMBINDEX, TROPONINI in the last 168 hours. ?BNP (last 3 results) ?No results for input(s): PROBNP in the last 8760 hours. ?HbA1C: ?No results for input(s): HGBA1C in the last 72 hours. ?CBG: ?No results for input(s): GLUCAP in the last 168 hours. ? ?Lipid Profile: ?No results for input(s): CHOL, HDL, LDLCALC, TRIG, CHOLHDL, LDLDIRECT in the last 72 hours. ?Thyroid Function Tests: ?No results for input(s): TSH, T4TOTAL, FREET4, T3FREE, THYROIDAB in the last 72 hours. ?Anemia  Panel: ?No results for input(s): VITAMINB12, FOLATE, FERRITIN, TIBC, IRON, RETICCTPCT in the last 72 hours. ?Sepsis Labs: ?No results for input(s): PROCALCITON, LATICACIDVEN in the last 168 hours. ? ?No results found for this or any previous visit (from the past 240 hour(s)).  ? ? ? ? ? ?Radiology Studies: ?No results found. ? ? ? ? ? ?Scheduled Meds: ? QUEtiapine  50 mg Oral QHS  ? ?Continuous Infusions: ? ? LOS: 14 days   ? ? ?Time spent: 10 mins  ? ? ? ?Wyvonnia Dusky, MD ?[facility] ?Pager [phone] ? ?If 7PM-7AM, please contact night-coverage ?10/05/2021, 6:43 AM  ? ?

## 2021-10-06 DIAGNOSIS — F02818 Dementia in other diseases classified elsewhere, unspecified severity, with other behavioral disturbance: Secondary | ICD-10-CM | POA: Diagnosis not present

## 2021-10-06 DIAGNOSIS — G309 Alzheimer's disease, unspecified: Secondary | ICD-10-CM | POA: Diagnosis not present

## 2021-10-06 MED ORDER — CHLORHEXIDINE GLUCONATE CLOTH 2 % EX PADS: 6.0000 | MEDICATED_PAD | Freq: Every day | CUTANEOUS | Status: DC

## 2021-10-06 MED ORDER — LORAZEPAM 0.5 MG PO TABS
0.5000 mg | ORAL_TABLET | ORAL | Status: DC | PRN
  Administered 2021-10-15: 0.5 mg via ORAL
  Filled 2021-10-06: qty 1
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
## 2021-10-06 NOTE — Progress Notes (Signed)
Manufacturing engineer [facility]) Hospital Liaison Note ?  ?RNCM/Elena continues to seek LTC placement .At this time, there are no bed offers for LTC. Per chart review, [place] has been contacted to evaluate patient. TOC continues seek placement for patient. ACC will continue to follow through dispo.  ?  ?Please do not hesitate to call with any hospice related questions.  ?  ?Thank you for the opportunity to participate in this patient's care. ?  ?Daphene Calamity, MSW ?[place]  ?[phone] ?

## 2021-10-06 NOTE — TOC Progression Note (Signed)
Transition of Care (TOC) - Progression Note  ? ? ?Patient Details  ?Name: Marie [name] ?MRN: [id number] ?Date of Birth: [birth year] ? ?Transition of Care (TOC) CM/SW Contact  ?Pete Pelt, RN ?Phone Number: ?10/06/2021, 10:07 AM ? ?Clinical Narrative:   Patient's medicaid application is currently pending as per M. Gentle, Medicaid Caseworker.  [phone].  RNCM left message at [facility] to assess if they can accept patient, awaiting call back at this time.  TOC supervisor aware, TOC to follow. ? ?  ? ?  ? ? ? ?  ?  ? ?Expected Discharge Plan and Services ?  ?  ?  ?  ?  ?                ?  ?  ?  ?  ?  ?  ?  ?  ?  ?  ? ? ?Social Determinants of Health (SDOH) Interventions ?  ? ?Readmission Risk Interventions ?   ? View : No data to display.  ?  ?  ?  ? ? ?

## 2021-10-07 DIAGNOSIS — R4182 Altered mental status, unspecified: Secondary | ICD-10-CM | POA: Diagnosis not present

## 2021-10-07 DIAGNOSIS — G309 Alzheimer's disease, unspecified: Secondary | ICD-10-CM | POA: Diagnosis not present

## 2021-10-07 DIAGNOSIS — F02818 Dementia in other diseases classified elsewhere, unspecified severity, with other behavioral disturbance: Secondary | ICD-10-CM | POA: Diagnosis not present

## 2021-10-07 DIAGNOSIS — G9389 Other specified disorders of brain: Secondary | ICD-10-CM | POA: Diagnosis not present

## 2021-10-07 DIAGNOSIS — Z515 Encounter for palliative care: Secondary | ICD-10-CM | POA: Diagnosis not present
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
## 2021-10-07 NOTE — Progress Notes (Signed)
Manufacturing engineer [facility]) Hospital Liaison Note ?  ?RNCM/Elena continues to seek LTC placement. At this time, there are no bed offers for LTC. TOC continues seek placement for patient. ACC will continue to follow through dispo.  ?  ?Please do not hesitate to call with any hospice related questions.  ?  ?Thank you for the opportunity to participate in this patient's care. ?  ?Daphene Calamity, MSW ?[place]  ?[phone] ?

## 2021-10-07 NOTE — TOC Progression Note (Signed)
Transition of Care (TOC) - Progression Note  ? ? ?Patient Details  ?Name: Marie [name] ?MRN: [id number] ?Date of Birth: 1950-11-29 ? ?Transition of Care (TOC) CM/SW Contact  ?Pete Pelt, RN ?Phone Number: ?10/07/2021, 3:46 PM ? ?Clinical Narrative:  LOG sent to facility, they are presently evaluating.  No response from [facility] caseworker  ? ? ? ?  ?  ? ?Expected Discharge Plan and Services ?  ?  ?  ?  ?  ?                ?  ?  ?  ?  ?  ?  ?  ?  ?  ?  ? ? ?Social Determinants of Health (SDOH) Interventions ?  ? ?Readmission Risk Interventions ?   ? View : No data to display.  ?  ?  ?  ? ? ?
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
## 2021-10-08 DIAGNOSIS — F02818 Dementia in other diseases classified elsewhere, unspecified severity, with other behavioral disturbance: Secondary | ICD-10-CM | POA: Diagnosis not present

## 2021-10-08 DIAGNOSIS — Z7189 Other specified counseling: Secondary | ICD-10-CM

## 2021-10-08 DIAGNOSIS — G309 Alzheimer's disease, unspecified: Secondary | ICD-10-CM | POA: Diagnosis not present
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
## 2021-10-08 NOTE — Progress Notes (Signed)
Manufacturing engineer [facility]) hospital Liaison note: ? ?Asked by Henrene Hawking to reassess patient for hospice home appropriateness. It was reported that she was not eating and was lethargic. ?Patient seen at bedside, lying in bed eyes closed. Patient was mumbling and few words were understandable, but not able to converse. ?Her husband and daughter were at beside. They reported that she had eaten 1/2 of her lunch they fed her and then she ate a bag of "cracker jacks", They also reported that she drank several glasses of tea and coke. ?Writer did explain that if the patient is eating and drinking she still is not appearing to be eligible for the hospice home. She remains eligible for hospice services at SNF ?Epic chat to attending physician. Writer spoke with staff RN [name] to update. ? ?Hospital Liaison to see again tomorrow. ?Thank you ? ?Flo Shanks BSN, RN, [facility] ?Hospital Liaison ?Manufacturing engineer ?[phone] ? ?

## 2021-10-08 NOTE — Progress Notes (Addendum)
? ?                                                                                                                                                     ?                                                   ?Daily Progress Note  ? ?Patient Name: Marie [name]       Date: 10/08/2021 ?DOB: 1950/08/03  Age: 71 y.o. MRN#: [id number] ?Attending Physician: Caren Griffins, MD ?Primary Care Physician: [name], MD ?Admit Date: 09/20/2021 ? ?Reason for Consultation/Follow-up: Establishing goals of care ? ?Subjective: ?Contacted by nursing to update patient is not eating very much today and is more lethargic. Notes reviewed. It appears patient was not a candidate for hospice facility placement on 4/5, and currently TOC is working on LTC placement. In to see patient. Patient resting with eyes closed, no distress noted. No family at bedside. Attempted to call husband unsuccessfully. If patient is not eating or drinking, or is doing so minimally, could ask hospice to review candidacy for hospice facility placement if family still desires this. Discussed this with TOC.  ? ?Length of Stay: 17 ? ?Current Medications: ?Scheduled Meds:  ? QUEtiapine  50 mg Oral QHS  ? ? ?Continuous Infusions: ? ? ?PRN Meds: ?acetaminophen **OR** acetaminophen, antiseptic oral rinse, glycopyrrolate **OR** glycopyrrolate **OR** glycopyrrolate, hydrOXYzine, LORazepam, morphine injection, morphine, ondansetron **OR** ondansetron (ZOFRAN) IV, polyvinyl alcohol ? ?Physical Exam ?Constitutional:   ?   Comments: Eyes closed.   ?Pulmonary:  ?   Effort: Pulmonary effort is normal.  ?         ? ?Vital Signs: BP (!) 115/57 (BP Location: Left Arm)   Pulse 84   Temp 97.8 ?F (36.6 ?C)   Resp 20   Ht '5\' 5"'$  (1.651 m)   Wt 45.4 kg   SpO2 100%   BMI 16.64 kg/m?  ?SpO2: SpO2: 100 % ?O2 Device: O2 Device: Room Air ?O2 Flow Rate:   ? ?Intake/output summary:  ?Intake/Output Summary (Last 24 hours) at 10/08/2021 1304 ?Last data filed at 10/07/2021 1418 ?Gross  per 24 hour  ?Intake 0 ml  ?Output --  ?Net 0 ml  ? ?LBM: Last BM Date : 10/07/21 ?Baseline Weight: Weight: 45.4 kg ?Most recent weight: Weight: 45.4 kg ? ? ? ?Patient Active Problem List  ? Diagnosis Date Noted  ? Agitation 09/21/2021  ? Neoplasm of brain causing mass effect on adjacent structures ([place])   ? Involuntary commitment   ? Diabetes mellitus without complication ([place])   ? Acute metabolic encephalopathy 47/42/5956  ? Acute renal failure superimposed on stage 3a  chronic kidney disease ([place]) 07/31/2021  ? Hypernatremia 07/31/2021  ? Social problem 07/31/2021  ? Cellulitis of right knee 09/02/2020  ? Intertriginous candidiasis 09/02/2020  ? Cellulitis of breast 09/02/2020  ? Agitation due to dementia [facility]) 09/02/2020  ? Hallucinations 09/02/2020  ? History of SCC (squamous cell carcinoma) of skin (chest, 03/2019) 04/22/2020  ? Osteopenia 01/21/2020  ? Alzheimer's dementia with behavioral disturbance ([place]) 01/21/2020  ? Aortic atherosclerosis ([place]) by CTA scan on 11/04/2019 01/20/2020  ? CAD (coronary artery disease) 11/06/2019  ? History of NSTEMI - 11/04/19 11/04/2019  ? Malignant neoplasm of upper-outer quadrant of right breast in female, estrogen receptor positive ([place]) 02/13/2018  ? CKD stage 3 due to type 2 diabetes mellitus ([place]) 10/04/2017  ? BMI 22.0-22.9, adult 05/05/2015  ? Generalized anxiety disorder 08/04/2014  ? Essential hypertension 04/28/2014  ? Medication management 04/28/2014  ? Vitamin D deficiency 10/04/2013  ? Type 2 diabetes mellitus with stage 3a chronic kidney disease, without long-term current use of insulin ([place])   ? Depression, major, recurrent, in partial remission ([place])   ? SDAT    ? Hyperlipidemia associated with type 2 diabetes mellitus ([place]) 07/28/2007  ? Allergic rhinitis 07/28/2007  ? ? ?Palliative Care Assessment & Plan  ? ? ? ?Recommendations/Plan: ?If patient is not eating or drinking, or is doing so minimally, could ask hospice to review candidacy for hospice facility  placement if family still desires this. ? ? ?Code Status: ? ?  ?Code Status Orders  ?(From admission, onward)  ?  ? ? ?  ? ?  Start     Ordered  ? 09/22/21 1516  Do not attempt resuscitation (DNR)  Continuous       ?Question Answer Comment  ?In the event of cardiac or respiratory ARREST Do not call a ?code blue?   ?In the event of cardiac or respiratory ARREST Do not perform Intubation, CPR, defibrillation or ACLS   ?In the event of cardiac or respiratory ARREST Use medication by any route, position, wound care, and other measures to relive pain and suffering. May use oxygen, suction and manual treatment of airway obstruction as needed for comfort.   ?  ? 09/22/21 1515  ? ?  ?  ? ?  ? ?Code Status History   ? ? Date Active Date Inactive Code Status Order ID Comments User Context  ? 09/21/2021 0929 09/22/2021 1515 DNR [id number]  Donne Hazel, MD ED  ? 09/21/2021 0117 09/21/2021 0927 Full Code [id number]  Athena Masse, MD ED  ? 08/06/2021 1831 08/13/2021 1705 DNR [id number]  Randell Loop, RN Inpatient  ? 07/31/2021 2307 08/06/2021 1831 Full Code [id number]  Shela Leff, MD Inpatient  ? 09/09/2020 2329 09/10/2020 2339 Full Code [id number]  Daleen Bo, MD ED  ? 11/04/2019 2127 11/06/2019 1826 Full Code [id number]  [name], NP ED  ? ?  ? ?Advance Directive Documentation   ? ?Flowsheet Row Most Recent Value  ?Type of Advance Directive Healthcare Power of Attorney  ?Pre-existing out of facility DNR order (yellow form or pink MOST form) --  ?"MOST" Form in Place? --  ? ?  ? ? ?Prognosis: ? < 2 weeks ? ? ?Care plan was discussed with TOC and nursing ? ?Thank you for allowing the Palliative Medicine Team to assist in the care of this patient. ? ? ?Asencion Gowda, NP ? ?Please contact Palliative Medicine Team phone at [phone] for questions and concerns.  ? ? ? ? ? ?
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
## 2021-10-09 DIAGNOSIS — F02818 Dementia in other diseases classified elsewhere, unspecified severity, with other behavioral disturbance: Secondary | ICD-10-CM | POA: Diagnosis not present

## 2021-10-09 DIAGNOSIS — G309 Alzheimer's disease, unspecified: Secondary | ICD-10-CM | POA: Diagnosis not present
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
## 2021-10-10 DIAGNOSIS — G309 Alzheimer's disease, unspecified: Secondary | ICD-10-CM | POA: Diagnosis not present

## 2021-10-10 DIAGNOSIS — F02818 Dementia in other diseases classified elsewhere, unspecified severity, with other behavioral disturbance: Secondary | ICD-10-CM | POA: Diagnosis not present
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
## 2021-10-10 NOTE — Progress Notes (Signed)
Manufacturing engineer (ACC) ? ?ACC continues to follow peripherally for discharge planning.  ? ?Venia Carbon BSN, RN ?[facility] Liaison  ?

## 2021-10-11 DIAGNOSIS — F02818 Dementia in other diseases classified elsewhere, unspecified severity, with other behavioral disturbance: Secondary | ICD-10-CM | POA: Diagnosis not present

## 2021-10-11 DIAGNOSIS — G309 Alzheimer's disease, unspecified: Secondary | ICD-10-CM | POA: Diagnosis not present

## 2021-10-11 NOTE — Progress Notes (Signed)
Manufacturing engineer [facility]) Hospital Liaison Note ?  ?RNCM/Elena continues to seek LTC placement. At this time, there are no bed offers for LTC. TOC continues seek placement for patient. ACC will continue to follow through dispo.  ?  ?Please do not hesitate to call with any hospice related questions.  ?  ?Thank you for the opportunity to participate in this patient's care. ?  ?Daphene Calamity, MSW ?[place]  ?[phone] ?
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
## 2021-10-11 NOTE — Progress Notes (Signed)
Nutrition Brief Note  Chart reviewed. Pt now transitioning to comfort care.  No further nutrition interventions planned at this time.  Please re-consult as needed.   Thula Stewart W, RD, LDN, CDCES Registered Dietitian II Certified Diabetes Care and Education Specialist Please refer to AMION for RD and/or RD on-call/weekend/after hours pager   

## 2021-10-11 NOTE — TOC Progression Note (Signed)
Transition of Care (TOC) - Progression Note  ? ? ?Patient Details  ?Name: Marie [name] ?MRN: [id number] ?Date of Birth: Aug 07, 1950 ? ?Transition of Care (TOC) CM/SW Contact  ?Pete Pelt, RN ?Phone Number: ?10/11/2021, 12:44 PM ? ?Clinical Narrative:   RNCM spoke to [employer], Medicaid Caseworker [phone].who could not offer information to [facility] except to say medicaid is in process.  TOC to follow for LTC bed offers. ? ? ? ?  ?  ? ?Expected Discharge Plan and Services ?  ?  ?  ?  ?  ?                ?  ?  ?  ?  ?  ?  ?  ?  ?  ?  ? ? ?Social Determinants of Health (SDOH) Interventions ?  ? ?Readmission Risk Interventions ?   ? View : No data to display.  ?  ?  ?  ? ? ?

## 2021-10-12 DIAGNOSIS — G309 Alzheimer's disease, unspecified: Secondary | ICD-10-CM | POA: Diagnosis not present

## 2021-10-12 DIAGNOSIS — F02818 Dementia in other diseases classified elsewhere, unspecified severity, with other behavioral disturbance: Secondary | ICD-10-CM | POA: Diagnosis not present
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
## 2021-10-12 NOTE — TOC Progression Note (Signed)
Transition of Care (TOC) - Progression Note  ? ? ?Patient Details  ?Name: MYLEE FALIN ?MRN: [id number] ?Date of Birth: 03/15/1951 ? ?Transition of Care (TOC) CM/SW Contact  ?Pete Pelt, RN ?Phone Number: ?10/12/2021, 2:52 PM ? ?Clinical Narrative:   RNCM spoke to Staley at facility, they are unable to accept Letter of Guarantee (LOG) for this patient, as facility administration declined to accept patient. TOC supervisor made aware. ? ? ? ?  ?  ? ?Expected Discharge Plan and Services ?  ?  ?  ?  ?  ?                ?  ?  ?  ?  ?  ?  ?  ?  ?  ?  ? ? ?Social Determinants of Health (SDOH) Interventions ?  ? ?Readmission Risk Interventions ?   ? View : No data to display.  ?  ?  ?  ? ? ?

## 2021-10-12 NOTE — Progress Notes (Signed)
Manufacturing engineer [facility]) Hospital Liaison Note ?  ?RNCM/Elena continues to seek LTC placement. At this time, there are no bed offers for LTC. TOC continues seek placement for patient. ACC will continue to follow through dispo.  ?  ?Please do not hesitate to call with any hospice related questions.  ?  ?Thank you for the opportunity to participate in this patient's care. ?  ?Daphene Calamity, MSW ?[place]  ?[phone] ?

## 2021-10-13 DIAGNOSIS — G309 Alzheimer's disease, unspecified: Secondary | ICD-10-CM | POA: Diagnosis not present

## 2021-10-13 DIAGNOSIS — F02818 Dementia in other diseases classified elsewhere, unspecified severity, with other behavioral disturbance: Secondary | ICD-10-CM | POA: Diagnosis not present
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
## 2021-10-13 NOTE — TOC Progression Note (Signed)
Transition of Care (TOC) - Progression Note  ? ? ?Patient Details  ?Name: Marie [name] ?MRN: [id number] ?Date of Birth: 08/16/1950 ? ?Transition of Care (TOC) CM/SW Contact  ?Pete Pelt, RN ?Phone Number: ?10/13/2021, 3:57 PM ? ?Clinical Narrative:   RNCM contacted Tonya at [facility] re:  LOG for placement.  Tonya from facility informed RNCM that [facility] does not accept LOG from [place] fo continue to follow for placement. ? ? ? ?  ?  ? ?Expected Discharge Plan and Services ?  ?  ?  ?  ?  ?                ?  ?  ?  ?  ?  ?  ?  ?  ?  ?  ? ? ?Social Determinants of Health (SDOH) Interventions ?  ? ?Readmission Risk Interventions ?   ? View : No data to display.  ?  ?  ?  ? ? ?

## 2021-10-13 NOTE — Progress Notes (Signed)
Manufacturing engineer [facility]) Hospital Liaison Note ?  ?RNCM/Elena continues to seek LTC placement. At this time, there are no bed offers for LTC. TOC continues seek placement for patient. ACC will continue to follow through dispo.  ?  ?Please do not hesitate to call with any hospice related questions.  ?  ?Thank you for the opportunity to participate in this patient's care. ?  ?Daphene Calamity, MSW ?[place]  ?[phone] ?

## 2021-10-14 DIAGNOSIS — G309 Alzheimer's disease, unspecified: Secondary | ICD-10-CM | POA: Diagnosis not present

## 2021-10-14 DIAGNOSIS — F02818 Dementia in other diseases classified elsewhere, unspecified severity, with other behavioral disturbance: Secondary | ICD-10-CM | POA: Diagnosis not present

## 2021-10-14 NOTE — TOC Progression Note (Signed)
Transition of Care (TOC) - Progression Note  ? ? ?Patient Details  ?Name: Marie [name] ?MRN: [id number] ?Date of Birth: 02-Dec-1950 ? ?Transition of Care (TOC) CM/SW Contact  ?Candie Chroman, LCSW ?Phone Number: ?10/14/2021, 11:35 AM ? ?Clinical Narrative:   Still no bed offers. ? ?Expected Discharge Plan and Services ?  ?  ?  ?  ?  ?                ?  ?  ?  ?  ?  ?  ?  ?  ?  ?  ? ? ?Social Determinants of Health (SDOH) Interventions ?  ? ?Readmission Risk Interventions ?   ? View : No data to display.  ?  ?  ?  ? ? ?
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
## 2021-10-14 NOTE — Progress Notes (Signed)
Manufacturing engineer [facility]) Hospital Liaison Note ?  ?TOC continues to seek LTC placement. At this time, there are no bed offers for LTC. TOC continues seek placement for patient. ACC will continue to follow through dispo.  ?  ?Please do not hesitate to call with any hospice related questions.  ?  ?Thank you for the opportunity to participate in this patient's care. ?  ?Daphene Calamity, MSW ?[place]  ?[phone] ?
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
## 2021-10-15 DIAGNOSIS — F02818 Dementia in other diseases classified elsewhere, unspecified severity, with other behavioral disturbance: Secondary | ICD-10-CM | POA: Diagnosis not present

## 2021-10-15 DIAGNOSIS — G309 Alzheimer's disease, unspecified: Secondary | ICD-10-CM | POA: Diagnosis not present
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
## 2021-10-15 NOTE — Progress Notes (Signed)
Manufacturing engineer [facility]) Hospital Liaison Note ?  ?TOC continues to seek LTC placement. At this time, there are no bed offers for LTC. TOC continues seek placement for patient. [facility] will continue to follow through dispo.  ?  ?Please do not hesitate to call with any hospice related questions.  ?  ?Thank you for the opportunity to participate in this patient's care. ?  ?Daphene Calamity, MSW ?[place]  ?[phone] ?

## 2021-10-15 NOTE — Care Management Important Message (Signed)
Important Message ? ?Patient Details  ?Name: Marie [name] ?MRN: [id number] ?Date of Birth: [birth year] ? ? ?Medicare Important Message Given:  Other (see comment) ? ?Disposition to discharge with hospice services.  Medicare IM withheld at this time out of respect for patient and family.  ? ? ?Dannette Barbara ?10/15/2021, 8:59 AM ?

## 2021-10-16 DIAGNOSIS — G309 Alzheimer's disease, unspecified: Secondary | ICD-10-CM | POA: Diagnosis not present

## 2021-10-16 DIAGNOSIS — F02818 Dementia in other diseases classified elsewhere, unspecified severity, with other behavioral disturbance: Secondary | ICD-10-CM | POA: Diagnosis not present

## 2021-10-16 NOTE — Assessment & Plan Note (Signed)
Patient appears quite calm now. 

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
## 2021-10-16 NOTE — Progress Notes (Signed)
?  Manufacturing engineer [facility]) Hospital Liaison Note ?  ?TOC continues to seek LTC placement. At this time, there are no bed offers for LTC. TOC continues seek placement for patient. [facility] will continue to follow through dispo.  ?  ?Please do not hesitate to call with any hospice related questions.  ?  ?Thank you for the opportunity to participate in this patient's care. ? ?Jhonnie Garner, BSN, RN, WTA-C ?[phone] ?

## 2021-10-17 DIAGNOSIS — G309 Alzheimer's disease, unspecified: Secondary | ICD-10-CM | POA: Diagnosis not present

## 2021-10-17 DIAGNOSIS — F02818 Dementia in other diseases classified elsewhere, unspecified severity, with other behavioral disturbance: Secondary | ICD-10-CM | POA: Diagnosis not present

## 2021-10-17 NOTE — TOC Progression Note (Signed)
Transition of Care (TOC) - Progression Note  ? ? ?Patient Details  ?Name: Marie [name] ?MRN: [id number] ?Date of Birth: Feb 03, 1951 ? ?Transition of Care (TOC) CM/SW Contact  ?Izola Price, RN ?Phone Number: ?10/17/2021, 1:19 PM ? ?Clinical Narrative:  4/23: 120 pm. Hospice contacted to re-evaluate patient for inpatient hospice appropriateness per provider request. Per provider, patient reportedly less responsive and very poor po intake.Venia Carbon, hospice liaison, contacted, she will follow up with hospice team.  ?Simmie Davies RN CM  ? ? ? ?  ?  ? ?Expected Discharge Plan and Services ?  ?  ?  ?  ?  ?                ?  ?  ?  ?  ?  ?  ?  ?  ?  ?  ? ? ?Social Determinants of Health (SDOH) Interventions ?  ? ?Readmission Risk Interventions ?   ? View : No data to display.  ?  ?  ?  ? ? ?

## 2021-10-17 NOTE — Progress Notes (Signed)
Manufacturing engineer (ACC) ? ?Received request to re-evaluate for inpatient hospice appropriateness. ? ?Upon visit, patient opened her eyes and advised she was cold. Replaced her covers and attempted to illicit further conversation but she quickly closed her eyes. ? ?Spoke with her husband on the phone, he endorsed that on Friday she ate an entire box of snacks that he brought her. Noted lunch tray with ~ 75% eaten, breakfast recorded as 50% consumed. ? ?Discussed with hospice physician, she is still not eligible for either of our residential hospice facilities at this time. ? ?Discussed with [facility] manager possibility of seeing if family would be interested in [facility] facility, as they have admission criteria of < 6 weeks. ? ?[facility] will continue to follow peripherally in the event she declines further and/or is placed in a facility that we serve. ? ?Thank you, ?Venia Carbon BSN, RN ?[facility] Liaison  ?
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
## 2021-10-17 NOTE — Assessment & Plan Note (Signed)
Discussed case with patient's husband, who affirms no further intervention, including biopsy and reports "the Reita Cliche will take her when he's ready" ?Pt's husband confirms patient's known wishes for DNR/DNI ?Given CT findings of enlarging mass and midline shift, Palliative Care has been consulted. Prognosis will definitely be <6 months, and perhaps even down to <2 weeks given midline shift, making pt eligible for hospice  ?Did not qualify for residential hospice in the beginning but now becoming more unresponsive, extremely poor p.o. intake, asked TOC for hospice reevaluation as she should qualify for inpatient hospice at this point. ?-Continue with supportive care ?

## 2021-10-18 DIAGNOSIS — G309 Alzheimer's disease, unspecified: Secondary | ICD-10-CM | POA: Diagnosis not present

## 2021-10-18 DIAGNOSIS — F02818 Dementia in other diseases classified elsewhere, unspecified severity, with other behavioral disturbance: Secondary | ICD-10-CM | POA: Diagnosis not present

## 2021-10-18 NOTE — TOC Progression Note (Addendum)
Transition of Care (TOC) - Progression Note  ? ? ?Patient Details  ?Name: Marie [name] ?MRN: [id number] ?Date of Birth: 06-Apr-1951 ? ?Transition of Care (TOC) CM/SW Contact  ?Candie Chroman, LCSW ?Phone Number: ?10/18/2021, 9:52 AM ? ?Clinical Narrative:  Still no SNF bed offers. Called husband to discuss looking at other hospice facilities that can accept patients with a prognosis longer than two weeks. He said they live in [place] so [place] would be closest for them. Spoke with intake coordinator, Cassandra, and faxed referral for review.  ? ?12:18 pm: Received call from [facility] representative. Medical director has not reviewed referral yet but she did say they have a long wait list of 8 or 9 other people. She will call back once medical director has reviewed to determine if she's appropriate or not. ? ?Expected Discharge Plan and Services ?  ?  ?  ?  ?  ?                ?  ?  ?  ?  ?  ?  ?  ?  ?  ?  ? ? ?Social Determinants of Health (SDOH) Interventions ?  ? ?Readmission Risk Interventions ?   ? View : No data to display.  ?  ?  ?  ? ? ?
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
## 2021-10-18 NOTE — Progress Notes (Signed)
Manufacturing engineer [facility]) Hospital Liaison Note ?  ?TOC continues to seek LTC placement and/or other [facility] Facilities. At this time, there are no bed offers for LTC. TOC continues seek placement for patient. [facility] will continue to follow through dispo.  ?  ?Please do not hesitate to call with any hospice related questions.  ?  ?Thank you for the opportunity to participate in this patient's care. ?  ?Daphene Calamity, MSW ?[place]  ?[phone] ?

## 2021-10-19 DIAGNOSIS — G309 Alzheimer's disease, unspecified: Secondary | ICD-10-CM | POA: Diagnosis not present

## 2021-10-19 DIAGNOSIS — F02818 Dementia in other diseases classified elsewhere, unspecified severity, with other behavioral disturbance: Secondary | ICD-10-CM | POA: Diagnosis not present

## 2021-10-19 NOTE — TOC Progression Note (Signed)
Transition of Care (TOC) - Progression Note  ? ? ?Patient Details  ?Name: Marie [name] ?MRN: [id number] ?Date of Birth: [birth year] ? ?Transition of Care (TOC) CM/SW Contact  ?Candie Chroman, LCSW ?Phone Number: ?10/19/2021, 11:23 AM ? ?Clinical Narrative:   [facility] referral. No beds today but they did say two people dropped off from list of 8-9 people that they had yesterday. Husband has been updated. ? ?Expected Discharge Plan and Services ?  ?  ?  ?  ?  ?                ?  ?  ?  ?  ?  ?  ?  ?  ?  ?  ? ? ?Social Determinants of Health (SDOH) Interventions ?  ? ?Readmission Risk Interventions ?   ? View : No data to display.  ?  ?  ?  ? ? ?

## 2021-10-19 NOTE — Progress Notes (Signed)
Manufacturing engineer [facility]) Hospital Liaison Note ?  ?TOC continues to seek LTC placement and/or other [place] Facilities. At this time, there are no bed offers for LTC. TOC continues seek placement for patient. [facility] will continue to follow through dispo.  ?  ?Please do not hesitate to call with any hospice related questions.  ?  ?Thank you for the opportunity to participate in this patient's care. ?  ?Daphene Calamity, MSW ?[place]  ?[phone] ?
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
## 2021-10-19 NOTE — Care Management Important Message (Signed)
Important Message ? ?Patient Details  ?Name: Marie [name] ?MRN: [id number] ?Date of Birth: 11-25-1950 ? ? ?Medicare Important Message Given:  Other (see comment) ? ?Patient is on comfort care and will transfer to [place] once a bed is available. Out of respect for the patient and family no Important Message from [facility] given.  ? ? ?[name] ?10/19/2021, 3:15 PM ?

## 2021-10-20 ENCOUNTER — Encounter: Payer: Medicare PPO | Admitting: Internal Medicine

## 2021-10-20 DIAGNOSIS — D496 Neoplasm of unspecified behavior of brain: Secondary | ICD-10-CM | POA: Diagnosis not present

## 2021-10-20 DIAGNOSIS — E119 Type 2 diabetes mellitus without complications: Secondary | ICD-10-CM | POA: Diagnosis not present

## 2021-10-20 DIAGNOSIS — I2510 Atherosclerotic heart disease of native coronary artery without angina pectoris: Secondary | ICD-10-CM | POA: Diagnosis not present

## 2021-10-20 DIAGNOSIS — G309 Alzheimer's disease, unspecified: Secondary | ICD-10-CM | POA: Diagnosis not present

## 2021-10-20 NOTE — Progress Notes (Signed)
Manufacturing engineer [facility]) Hospital Liaison Note ?  ?TOC continues to seek LTC placement and/or other [place] Facilities. At this time, there are no bed offers for LTC. TOC continues seek placement for patient. [facility] will continue to follow through dispo.  ?  ?Please do not hesitate to call with any hospice related questions.  ?  ?Thank you for the opportunity to participate in this patient's care. ?  ?Daphene Calamity, MSW ?[place]  ?[phone] ?

## 2021-10-20 NOTE — Discharge Summary (Incomplete)
? ? ?Physician Discharge Summary  ?Patient: KAYDON CREEDON JAS:[id number] DOB: 10/22/1950   Code Status: DNR ?Admit date: 09/20/2021 ?Discharge date: 10/20/2021 ?Disposition: {Plan; Disposition:26390}, [place] services:27085} ?PCP: [name], MD ? ?Recommendations for Outpatient Follow-up:  ?Follow up with PCP within 1-2 weeks ? ? ?Discharge Diagnoses:  ?Principal Problem: ?  Alzheimer's dementia with behavioral disturbance ([place]) ?Active Problems: ?  Agitation ?  Neoplasm of brain causing mass effect on adjacent structures ([place]) ?  Essential hypertension ?  CAD (coronary artery disease) ?  Involuntary commitment ?  Diabetes mellitus without complication ([place]) ? ?Brief Hospital Course Summary: ?Pt *** ? ?Discharge Condition: {DISCHARGE CONDITION:19696}, improved ?Recommended discharge diet: {Discharge ALPF:790240973} ? ?Consultations: ?*** ? ?Procedures/Studies: ?*** ? ? ?Allergies as of 10/20/2021   ? ?   Reactions  ? Penicillins Rash  ? Swelling in face - last 10 years  ? ?  ?Med Rec must be completed prior to using this [place]*** ? ? ? ? ? ? ? ?Subjective   ?Pt reports *** ?Objective  ?Blood pressure 98/65, pulse 73, temperature 97.6 ?F (36.4 ?C), temperature source Oral, resp. rate 16, height '5\' 5"'$  (1.651 m), weight 45.4 kg, SpO2 99 %.  ? ?General: Pt is alert, awake, not in acute distress ?Cardiovascular: RRR, S1/S2 +, no rubs, no gallops ?Respiratory: CTA bilaterally, no wheezing, no rhonchi ?Abdominal: Soft, NT, ND, bowel sounds + ?Extremities: no edema, no cyanosis ? ? ?The results of significant diagnostics from this hospitalization (including imaging, microbiology, ancillary and laboratory) are listed below for reference.  ? ?Imaging studies: ?CT Head Wo Contrast ? ?Result Date: 09/20/2021 ?CLINICAL DATA:  Altered level of consciousness, combative EXAM: CT HEAD WITHOUT CONTRAST TECHNIQUE: Contiguous axial images were obtained from the base of the skull through the vertex without intravenous contrast.  RADIATION DOSE REDUCTION: This exam was performed according to the departmental dose-optimization program which includes automated exposure control, adjustment of the mA and/or kV according to patient size and/or use of iterative reconstruction technique. COMPARISON:  08/01/2021, 08/02/2021 FINDINGS: Brain: Since the prior exam, the high attenuating region in the left temporal and parietal cortex has increased in size, measuring approximately 3.1 x 2.3 x 2.6 cm reference image 13 series 6. There is marked surrounding vasogenic edema that has developed in the interim, with effacement of the left lateral ventricle and minimal rightward midline shift measuring 6 mm. No abnormalities in the contralateral temporal and parietal lobe correspond to the subtle areas of increased signal on prior MRI. No evidence of acute infarct or hemorrhage. Lateral ventricles are otherwise unremarkable. No acute extra-axial fluid collections. Vascular: No hyperdense vessel or unexpected calcification. Skull: Normal. Negative for fracture or focal lesion. Sinuses/Orbits: No acute finding. Other: None. IMPRESSION: 1. Enlarging hyperdense mass lesion localizing to the left temporal/parietal cortex, measuring up to 3.1 cm on today's exam. Marked increase in surrounding vasogenic edema, with mass effect and mild rightward midline shift as above. Differential diagnosis includes progressive cerebritis or enlarging neoplasm. Repeat MRI with and without contrast is recommended for further evaluation. 2. No acute infarct or hemorrhage. Critical Value/emergent results were called by telephone at the time of interpretation on 09/20/2021 at 10:19 pm to provider [facility] , who verbally acknowledged these results. Electronically Signed   By: Randa Ngo M.D.   On: 09/20/2021 22:26   ? ?Labs: ?Basic Metabolic Panel: ?No results for input(s): NA, K, CL, CO2, GLUCOSE, BUN, CREATININE, CALCIUM, MG, PHOS in the last 168 hours. ?CBC: ?No results for  input(s): WBC,  NEUTROABS, HGB, HCT, MCV, PLT in the last 168 hours. ?Microbiology: ?*** ? ?Time coordinating discharge: Over 30 minutes ? ?Richarda Osmond, MD  ?[facility] ?10/20/2021, 7:41 AM ? ?

## 2021-10-20 NOTE — Progress Notes (Incomplete)
?PROGRESS NOTE ? ?Marie [name]    DOB: [birth year], 71 y.o.  ?ZJI:[id number]  ?  Code Status: DNR   ?DOA: 09/20/2021   LOS: 29  ? ?Brief hospital course  ?Marie [name] is a 71 y.o. female with a PMH significant for ***. ?They presented from *** to the ED on 09/20/2021 with *** x *** days. *** ?In the ED, it was found that they had ***.  ?They were treated with ***.  ?Patient was admitted to medicine service for further workup and management of *** as outlined in detail below. ? ?10/20/21 -*** ? ?Assessment & Plan  ?Principal Problem: ?  Alzheimer's dementia with behavioral disturbance ([place]) ?Active Problems: ?  Agitation ?  Neoplasm of brain causing mass effect on adjacent structures ([place]) ?  Essential hypertension ?  CAD (coronary artery disease) ?  Involuntary commitment ?  Diabetes mellitus without complication ([place]) ? ?*** ?-  ? ?*** ?-  ? ?*** ?-  ? ?*** ?-  ? ?*** ?-  ? ?Body mass index is 16.64 kg/m?. ? ?VTE ppx:  ? ? ?Diet:  ?   ?Diet  ? Diet regular Room service appropriate? No; Fluid consistency: Thin  ? ?Subjective 10/20/21   ? ?Pt reports *** ?  ?Objective  ? ?Vitals:  ? 10/15/21 0551 10/17/21 0428 10/17/21 0700 10/18/21 0500  ?BP: (!) 136/113 138/65 117/63 98/65  ?Pulse: 78 80 81 73  ?Resp: '17 16  16  '$ ?Temp: 98 ?F (36.7 ?C) 98.9 ?F (37.2 ?C)  97.6 ?F (36.4 ?C)  ?TempSrc:    Oral  ?SpO2: (!) 87% 96% 96% 99%  ?Weight:      ?Height:      ? ? ?Intake/Output Summary (Last 24 hours) at 10/20/2021 0741 ?Last data filed at 10/19/2021 1900 ?Gross per 24 hour  ?Intake 840 ml  ?Output --  ?Net 840 ml  ? ?Filed Weights  ? 09/20/21 2129  ?Weight: 45.4 kg  ?  ? ?Physical Exam: *** ?General: awake, alert, NAD ?HEENT: atraumatic, clear conjunctiva, anicteric sclera, MMM, hearing grossly normal ?Respiratory: normal respiratory effort. ?Cardiovascular: quick capillary refill, normal S1/S2, RRR, no JVD, murmurs ?Gastrointestinal: soft, NT, ND ?Nervous: A&O x3. no gross focal neurologic deficits, normal  speech ?Extremities: moves all equally, no edema, normal tone ?Skin: dry, intact, normal temperature, normal color. No rashes, lesions or ulcers on exposed skin ?Psychiatry: normal mood, congruent affect ? ?Labs   ?I have personally reviewed the following labs and imaging studies ?CBC ?   ?Component Value Date/Time  ? WBC 8.8 09/20/2021 2136  ? RBC 4.52 09/20/2021 2136  ? HGB 13.0 09/20/2021 2136  ? HGB 12.5 02/21/2018 1117  ? HCT 41.2 09/20/2021 2136  ? PLT 269 09/20/2021 2136  ? PLT 279 02/21/2018 1117  ? MCV 91.2 09/20/2021 2136  ? MCH 28.8 09/20/2021 2136  ? MCHC 31.6 09/20/2021 2136  ? RDW 14.6 09/20/2021 2136  ? LYMPHSABS 0.8 08/11/2021 0411  ? MONOABS 0.6 08/11/2021 0411  ? EOSABS 0.6 (H) 08/11/2021 0411  ? BASOSABS 0.1 08/11/2021 0411  ? ? ?  Latest Ref Rng & Units 09/20/2021  ?  9:36 PM 08/07/2021  ?  1:56 AM 08/06/2021  ?  1:13 AM  ?BMP  ?Glucose 70 - 99 mg/dL 151   145   112    ?BUN 8 - 23 mg/dL '29   9   7    '$ ?Creatinine 0.44 - 1.00 mg/dL 0.88   0.90   1.10    ?  Sodium 135 - 145 mmol/L 143   137   138    ?Potassium 3.5 - 5.1 mmol/L 3.9   3.7   3.6    ?Chloride 98 - 111 mmol/L 107   107   107    ?CO2 22 - 32 mmol/L '27   22   24    '$ ?Calcium 8.9 - 10.3 mg/dL 9.3   8.5   8.4    ? ? ?No results found. ? ?Disposition Plan & Communication  ?Patient status: Inpatient  ?Admitted From: {From:23814} ?Planned disposition location: {PLAN; DISPOSITION:26386} ?Anticipated discharge date: *** pending *** ? ?Family Communication: ***  ?  ?Author: ?Richarda Osmond, DO ?[facility] ?10/20/2021, 7:41 AM  ? ?Available by Epic secure chat 7AM-7PM. ?If 7PM-7AM, please contact night-coverage.  ?[facility] contact information found on [email or web address] ? ?
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
## 2021-10-20 NOTE — TOC Progression Note (Signed)
Transition of Care (TOC) - Progression Note  ? ? ?Patient Details  ?Name: Marie [name] ?MRN: [id number] ?Date of Birth: 1951-02-18 ? ?Transition of Care (TOC) CM/SW Contact  ?Pete Pelt, RN ?Phone Number: ?10/20/2021, 2:32 PM ? ?Clinical Narrative: Awaiting bed at [facility].   ? ? ? ?  ?  ? ?Expected Discharge Plan and Services ?  ?  ?  ?  ?  ?                ?  ?  ?  ?  ?  ?  ?  ?  ?  ?  ? ? ?Social Determinants of Health (SDOH) Interventions ?  ? ?Readmission Risk Interventions ?   ? View : No data to display.  ?  ?  ?  ? ? ?

## 2021-10-21 DIAGNOSIS — I10 Essential (primary) hypertension: Secondary | ICD-10-CM

## 2021-10-21 DIAGNOSIS — R4182 Altered mental status, unspecified: Secondary | ICD-10-CM | POA: Diagnosis present

## 2021-10-21 DIAGNOSIS — G309 Alzheimer's disease, unspecified: Secondary | ICD-10-CM | POA: Diagnosis not present

## 2021-10-21 DIAGNOSIS — I2510 Atherosclerotic heart disease of native coronary artery without angina pectoris: Secondary | ICD-10-CM | POA: Diagnosis not present

## 2021-10-21 DIAGNOSIS — R4689 Other symptoms and signs involving appearance and behavior: Secondary | ICD-10-CM | POA: Diagnosis not present
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
## 2021-10-21 NOTE — Progress Notes (Signed)
Manufacturing engineer [facility]) Hospital Liaison Note ?  ?TOC continues to seek LTC placement and/or other [place] Facilities. At this time, there are no bed offers for LTC. TOC continues seek placement for patient. ACC will continue to follow through dispo.  ?  ?Please do not hesitate to call with any hospice related questions.  ?  ?Thank you for the opportunity to participate in this patient's care. ?  ?Daphene Calamity, MSW ?[place]  ?[phone] ?

## 2021-10-22 DIAGNOSIS — G309 Alzheimer's disease, unspecified: Secondary | ICD-10-CM | POA: Diagnosis not present

## 2021-10-22 DIAGNOSIS — I2510 Atherosclerotic heart disease of native coronary artery without angina pectoris: Secondary | ICD-10-CM | POA: Diagnosis not present

## 2021-10-22 DIAGNOSIS — R4182 Altered mental status, unspecified: Secondary | ICD-10-CM | POA: Diagnosis not present

## 2021-10-22 DIAGNOSIS — E119 Type 2 diabetes mellitus without complications: Secondary | ICD-10-CM | POA: Diagnosis not present

## 2021-10-22 NOTE — TOC Progression Note (Signed)
Transition of Care (TOC) - Progression Note  ? ? ?Patient Details  ?Name: Marie [name] ?MRN: [id number] ?Date of Birth: 05-19-1951 ? ?Transition of Care (TOC) CM/SW Contact  ?Embrie Mikkelsen E Ebelin Dillehay, LCSW ?Phone Number: ?10/22/2021, 8:53 AM ? ?Clinical Narrative:   CSW left a VM for Cassandra at [facility] requesting follow up on bed availability.  ? ? ? ?  ?  ? ?Expected Discharge Plan and Services ?  ?  ?  ?  ?  ?                ?  ?  ?  ?  ?  ?  ?  ?  ?  ?  ? ? ?Social Determinants of Health (SDOH) Interventions ?  ? ?Readmission Risk Interventions ?   ? View : No data to display.  ?  ?  ?  ? ? ?
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
## 2021-10-22 NOTE — Progress Notes (Addendum)
Manufacturing engineer [facility]) Hospital Liaison Note ?  ?TOC continues to seek LTC placement and/or other [place] Facilities. Family continues to report that patient is eating 'great' and that she consumes all of her food when they are feeding her.  ? ?At this time, there are no bed offers for LTC. TOC continues seek placement for patient. ACC will continue to follow through dispo.  ?  ?Please do not hesitate to call with any hospice related questions.  ?  ?Thank you for the opportunity to participate in this patient's care. ?  ?Daphene Calamity, MSW ?[place]  ?[phone] ?

## 2021-10-23 DIAGNOSIS — E119 Type 2 diabetes mellitus without complications: Secondary | ICD-10-CM | POA: Diagnosis not present

## 2021-10-23 DIAGNOSIS — I2510 Atherosclerotic heart disease of native coronary artery without angina pectoris: Secondary | ICD-10-CM | POA: Diagnosis not present

## 2021-10-23 DIAGNOSIS — G309 Alzheimer's disease, unspecified: Secondary | ICD-10-CM | POA: Diagnosis not present

## 2021-10-23 DIAGNOSIS — R4182 Altered mental status, unspecified: Secondary | ICD-10-CM | POA: Diagnosis not present
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
## 2021-10-24 DIAGNOSIS — R4182 Altered mental status, unspecified: Secondary | ICD-10-CM | POA: Diagnosis not present

## 2021-10-24 DIAGNOSIS — G9389 Other specified disorders of brain: Secondary | ICD-10-CM | POA: Diagnosis not present

## 2021-10-24 DIAGNOSIS — I2510 Atherosclerotic heart disease of native coronary artery without angina pectoris: Secondary | ICD-10-CM | POA: Diagnosis not present

## 2021-10-24 DIAGNOSIS — G309 Alzheimer's disease, unspecified: Secondary | ICD-10-CM | POA: Diagnosis not present
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
## 2021-10-24 NOTE — TOC Progression Note (Signed)
Transition of Care (TOC) - Progression Note  ? ? ?Patient Details  ?Name: SHELETHA BOW ?MRN: [id number] ?Date of Birth: 1951/02/01 ? ?Transition of Care (TOC) CM/SW Contact  ?Domini Vandehei E Caty Tessler, LCSW ?Phone Number: ?10/24/2021, 3:26 PM ? ?Clinical Narrative:  [facility], spoke to [place], no beds today.  ? ? ? ?  ?  ? ?Expected Discharge Plan and Services ?  ?  ?  ?  ?  ?                ?  ?  ?  ?  ?  ?  ?  ?  ?  ?  ? ? ?Social Determinants of Health (SDOH) Interventions ?  ? ?Readmission Risk Interventions ?   ? View : No data to display.  ?  ?  ?  ? ? ?

## 2021-10-25 DIAGNOSIS — R400 Somnolence: Secondary | ICD-10-CM | POA: Diagnosis not present

## 2021-10-25 DIAGNOSIS — C719 Malignant neoplasm of brain, unspecified: Secondary | ICD-10-CM

## 2021-10-25 DIAGNOSIS — I2510 Atherosclerotic heart disease of native coronary artery without angina pectoris: Secondary | ICD-10-CM | POA: Diagnosis not present

## 2021-10-25 DIAGNOSIS — R4182 Altered mental status, unspecified: Secondary | ICD-10-CM | POA: Diagnosis not present

## 2021-10-25 DIAGNOSIS — G309 Alzheimer's disease, unspecified: Secondary | ICD-10-CM | POA: Diagnosis not present
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
## 2021-10-25 NOTE — Progress Notes (Signed)
Manufacturing engineer [facility]) Hospital Liaison Note ?  ?TOC continues to seek LTC placement and/or other [place] Facilities.   ?  ?At this time, there are no bed offers for LTC. TOC continues seek placement for patient. ACC will continue to follow through dispo.  ?  ?Please do not hesitate to call with any hospice related questions.  ?  ?Thank you for the opportunity to participate in this patient's care. ?  ?Daphene Calamity, MSW ?[place]  ?[phone] ?

## 2021-10-25 NOTE — Care Management Important Message (Signed)
Important Message ? ?Patient Details  ?Name: Marie [name] ?MRN: [id number] ?Date of Birth: 1950-11-17 ? ? ?Medicare Important Message Given:  Other (see comment) ? ?Patient is on comfort care and awaiting a bed at [facility].  ? ? ?Juliann Pulse A Taher Vannote ?10/25/2021, 8:38 AM ?

## 2021-10-26 DIAGNOSIS — G309 Alzheimer's disease, unspecified: Secondary | ICD-10-CM | POA: Diagnosis not present

## 2021-10-26 DIAGNOSIS — G9389 Other specified disorders of brain: Secondary | ICD-10-CM | POA: Diagnosis not present

## 2021-10-26 DIAGNOSIS — R4182 Altered mental status, unspecified: Secondary | ICD-10-CM | POA: Diagnosis not present

## 2021-10-26 DIAGNOSIS — I2510 Atherosclerotic heart disease of native coronary artery without angina pectoris: Secondary | ICD-10-CM | POA: Diagnosis not present

## 2021-10-26 NOTE — Progress Notes (Signed)
[facility]) Hospital Liaison Note ?  ?Family has chosen to pursue [place]. ACC to sign off at this time but is available if needs arise.  ?  ?Please do not hesitate to call with any hospice related questions.  ?  ?Thank you for the opportunity to participate in this patient's care. ?  ?Daphene Calamity, MSW ?[facility] Liaison ?[phone] ?
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
## 2021-10-26 NOTE — TOC Progression Note (Signed)
Transition of Care (TOC) - Progression Note  ? ? ?Patient Details  ?Name: Marie [name] ?MRN: [id number] ?Date of Birth: 21-Feb-1951 ? ?Transition of Care (TOC) CM/SW Contact  ?Pete Pelt, RN ?Phone Number: ?10/26/2021, 8:59 AM ? ?Clinical Narrative:   As per [facility] hospice, no bed today. ? ? ? ?  ?  ? ?Expected Discharge Plan and Services ?  ?  ?  ?  ?  ?                ?  ?  ?  ?  ?  ?  ?  ?  ?  ?  ? ? ?Social Determinants of Health (SDOH) Interventions ?  ? ?Readmission Risk Interventions ?   ? View : No data to display.  ?  ?  ?  ? ? ?
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
## 2021-10-28 DIAGNOSIS — F02818 Dementia in other diseases classified elsewhere, unspecified severity, with other behavioral disturbance: Secondary | ICD-10-CM | POA: Diagnosis not present

## 2021-10-28 DIAGNOSIS — G309 Alzheimer's disease, unspecified: Secondary | ICD-10-CM | POA: Diagnosis not present
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
## 2021-10-28 NOTE — TOC Progression Note (Signed)
Transition of Care (TOC) - Progression Note  ? ? ?Patient Details  ?Name: Marie [name] ?MRN: [id number] ?Date of Birth: [birth year] ? ?Transition of Care (TOC) CM/SW Contact  ?Pete Pelt, RN ?Phone Number: ?10/28/2021, 11:31 AM ? ?Clinical Narrative:   RNCM contacted Janett Billow at [facility] hospice Phone: [phone].  There are no beds at this time, but she has moved up on the list, and should be a few days at least.  TOC to follow. ? ? ?  ?  ? ?Expected Discharge Plan and Services ?  ?  ?  ?  ?  ?                ?  ?  ?  ?  ?  ?  ?  ?  ?  ?  ? ? ?Social Determinants of Health (SDOH) Interventions ?  ? ?Readmission Risk Interventions ?   ? View : No data to display.  ?  ?  ?  ? ? ?

## 2021-10-29 DIAGNOSIS — G309 Alzheimer's disease, unspecified: Secondary | ICD-10-CM | POA: Diagnosis not present

## 2021-10-29 DIAGNOSIS — F02818 Dementia in other diseases classified elsewhere, unspecified severity, with other behavioral disturbance: Secondary | ICD-10-CM | POA: Diagnosis not present
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
## 2021-10-29 NOTE — Care Management Important Message (Signed)
Important Message ? ?Patient Details  ?Name: MEGHEN AKOPYAN ?MRN: [id number] ?Date of Birth: 10-07-1950 ? ? ?Medicare Important Message Given:  Other (see comment) ? ?Patient remains on comfort care measures and waiting for bed at [facility] of respect for the patient and family no Important Message from [facility] given.  ? ? ?Juliann Pulse A Soyla Bainter ?10/29/2021, 8:29 AM ?

## 2021-10-30 DIAGNOSIS — G309 Alzheimer's disease, unspecified: Secondary | ICD-10-CM | POA: Diagnosis not present

## 2021-10-30 DIAGNOSIS — F02818 Dementia in other diseases classified elsewhere, unspecified severity, with other behavioral disturbance: Secondary | ICD-10-CM | POA: Diagnosis not present
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
## 2021-10-31 DIAGNOSIS — G309 Alzheimer's disease, unspecified: Secondary | ICD-10-CM | POA: Diagnosis not present

## 2021-10-31 DIAGNOSIS — F02818 Dementia in other diseases classified elsewhere, unspecified severity, with other behavioral disturbance: Secondary | ICD-10-CM | POA: Diagnosis not present
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
## 2021-11-01 DIAGNOSIS — F02818 Dementia in other diseases classified elsewhere, unspecified severity, with other behavioral disturbance: Secondary | ICD-10-CM | POA: Diagnosis not present

## 2021-11-01 DIAGNOSIS — G309 Alzheimer's disease, unspecified: Secondary | ICD-10-CM | POA: Diagnosis not present
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
## 2021-11-02 DIAGNOSIS — G309 Alzheimer's disease, unspecified: Secondary | ICD-10-CM | POA: Diagnosis not present

## 2021-11-02 DIAGNOSIS — F02818 Dementia in other diseases classified elsewhere, unspecified severity, with other behavioral disturbance: Secondary | ICD-10-CM | POA: Diagnosis not present
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
## 2021-11-03 DIAGNOSIS — G309 Alzheimer's disease, unspecified: Secondary | ICD-10-CM | POA: Diagnosis not present

## 2021-11-03 DIAGNOSIS — Z515 Encounter for palliative care: Secondary | ICD-10-CM | POA: Diagnosis not present

## 2021-11-03 DIAGNOSIS — R451 Restlessness and agitation: Secondary | ICD-10-CM | POA: Diagnosis not present

## 2021-11-03 DIAGNOSIS — G9389 Other specified disorders of brain: Secondary | ICD-10-CM | POA: Diagnosis not present

## 2021-11-03 NOTE — Assessment & Plan Note (Addendum)
Comfort care  °

## 2021-11-03 NOTE — Assessment & Plan Note (Addendum)
Patient receiving comfort care measures and awaiting hospice home bed.

## 2021-11-03 NOTE — Assessment & Plan Note (Addendum)
comfort care measures

## 2021-11-03 NOTE — Progress Notes (Signed)
?  Progress Note ? ? ?Patient: Marie [name] HFW:[id number] DOB: [birth year] DOA: 09/20/2021     43 ?DOS: the patient was seen and examined on 11/03/2021 ?  ? ?Assessment and Plan: ?* End of life care ?Patient receiving comfort care measures and awaiting hospice home bed. ? ?Agitation ?Agitation has improved. 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 ?Cerebral mass ?CT scan of the head shows enlarging hyperdense mass localizing to the left temporal parietal cortex measuring 3.1 cm with surrounding vasogenic edema and mass effect and midline right word shift. ? ?Alzheimer's dementia with behavioral disturbance ([place]) ?Haldol as needed for agitation and delirium precautions as above ?-Likely will be able to take the oral Seroquel. ? ?Diabetes mellitus without complication ([place]) ?Hold off on diabetes medication with comfort care measures ? ?CAD (coronary artery disease) ?No further treatment ? ?Essential hypertension ?Hold off on blood pressure medications with comfort care measures ? ?I ? ? ? ? ?  ? ?Subjective: Patient opened her eyes and closes her eyes.  Did not squeeze my hand.  Follow-up for comfort care measures.  Initially came in for a psychiatric evaluation and then found to have a neoplasm of the brain causing mass effect. ? ?Physical Exam: ?Vitals:  ? 10/29/21 2104 10/30/21 0277 10/31/21 4128 11/03/21 0826  ?BP: (!) 160/78 123/62 (!) 143/83 (!) 127/91  ?Pulse: (!) 101 93 78 85  ?Resp: '17 18 16 16  '$ ?Temp: 98.6 ?F (37 ?C) 98 ?F (36.7 ?C) 98.9 ?F (37.2 ?C) 98.5 ?F (36.9 ?C)  ?TempSrc:      ?SpO2: 95% 97% 98% 97%  ?Weight:      ?Height:      ? ?Physical Exam ?HENT:  ?   Head: Normocephalic.  ?   Mouth/Throat:  ?   Pharynx: No oropharyngeal exudate.  ?Eyes:  ?   General: Lids are normal.  ?   Conjunctiva/sclera: Conjunctivae normal.  ?Cardiovascular:  ?   Rate and Rhythm:  Normal rate and regular rhythm.  ?   Heart sounds: Normal heart sounds, S1 normal and S2 normal.  ?Pulmonary:  ?   Breath sounds: No decreased breath sounds, wheezing, rhonchi or rales.  ?Abdominal:  ?   Palpations: Abdomen is soft.  ?   Tenderness: There is no abdominal tenderness.  ?Musculoskeletal:  ?   Right lower leg: No swelling.  ?   Left lower leg: No swelling.  ?Skin: ?   General: Skin is warm.  ?   Findings: No rash.  ?Neurological:  ?   Mental Status: She is lethargic.  ?  ?Data Reviewed: Reviewed data from hospital stay. ? ?Family Communication: Left message for son on the phone ? ?Disposition: ?Status is: Inpatient ?Awaiting hospice home bed, receiving comfort care measures ?Planned Discharge Destination: Awaiting hospice home bed ? ?Author: ?Loletha Grayer, MD ?11/03/2021 1:49 PM ? ?For on call review [email or web address]  ?

## 2021-11-03 NOTE — Assessment & Plan Note (Addendum)
CT scan of the head shows enlarging hyperdense mass localizing to the left temporal parietal cortex measuring 3.1 cm with surrounding vasogenic edema and mass effect and midline right shift. ?

## 2021-11-04 DIAGNOSIS — G309 Alzheimer's disease, unspecified: Secondary | ICD-10-CM | POA: Diagnosis not present

## 2021-11-04 DIAGNOSIS — R451 Restlessness and agitation: Secondary | ICD-10-CM | POA: Diagnosis not present

## 2021-11-04 DIAGNOSIS — Z515 Encounter for palliative care: Secondary | ICD-10-CM | POA: Diagnosis not present

## 2021-11-04 DIAGNOSIS — G9389 Other specified disorders of brain: Secondary | ICD-10-CM | POA: Diagnosis not present

## 2021-11-04 NOTE — TOC Progression Note (Signed)
Transition of Care (TOC) - Progression Note  ? ? ?Patient Details  ?Name: Marie [name] ?MRN: [id number] ?Date of Birth: [birth year] ? ?Transition of Care (TOC) CM/SW Contact  ?Pete Pelt, RN ?Phone Number: ?11/04/2021, 3:52 PM ? ?Clinical Narrative:   Spoke to [facility] no beds today, they state patient has moved up from 9th to 3rd on waiting list.  TOC to [phone]. ? ? ? ?  ?  ? ?Expected Discharge Plan and Services ?  ?  ?  ?  ?  ?                ?  ?  ?  ?  ?  ?  ?  ?  ?  ?  ? ? ?Social Determinants of Health (SDOH) Interventions ?  ? ?Readmission Risk Interventions ?   ? View : No data to display.  ?  ?  ?  ? ? ?

## 2021-11-04 NOTE — Progress Notes (Signed)
?  Progress Note ? ? ?Patient: Marie [name] [id number]:[id number] DOB: June 29, 1950 DOA: 09/20/2021     44 ?DOS: the patient was seen and examined on 11/04/2021 ?  ? ?Assessment and Plan: ?* End of life care ?Patient receiving comfort care measures and still awaiting hospice home bed. ? 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 ?Cerebral mass ?CT scan of the head shows enlarging hyperdense mass localizing to the left temporal parietal cortex measuring 3.1 cm with surrounding vasogenic edema and mass effect and midline right word shift. ? ?Alzheimer's dementia with behavioral disturbance ([place]) ?Haldol as needed for agitation and delirium precautions as above ?-Likely will not be able to take oral meds. ? ?Diabetes mellitus without complication ([place]) ?Hold off on diabetes medication with comfort care measures ? ?CAD (coronary artery disease) ?No further treatment ? ?Essential hypertension ?Hold off on blood pressure medications with comfort care measures ? ? ? ? ? ?  ? ?Subjective: Patient open eyes briefly with sternal rub.  Being treated for end-of-life care.  Awaiting hospice home bed. ? ?Physical Exam: ?Vitals:  ? 10/30/21 0756 10/31/21 0738 11/03/21 0826 11/04/21 0757  ?BP: 123/62 (!) 143/83 (!) 127/91 (!) 109/58  ?Pulse: 93 78 85 (!) 104  ?Resp: '18 16 16 15  '$ ?Temp: 98 ?F (36.7 ?C) 98.9 ?F (37.2 ?C) 98.5 ?F (36.9 ?C) 99 ?F (37.2 ?C)  ?TempSrc:    Oral  ?SpO2: 97% 98% 97% 96%  ?Weight:      ?Height:      ? ?Physical Exam ?HENT:  ?   Head: Normocephalic.  ?   Mouth/Throat:  ?   Pharynx: No oropharyngeal exudate.  ?Eyes:  ?   General: Lids are normal.  ?   Conjunctiva/sclera: Conjunctivae normal.  ?   Comments: Pupils fixed  ?Cardiovascular:  ?   Rate and Rhythm: Normal rate and regular rhythm.  ?   Heart sounds: Normal heart sounds, S1 normal and S2 normal.   ?Pulmonary:  ?   Breath sounds: No decreased breath sounds, wheezing, rhonchi or rales.  ?Abdominal:  ?   Palpations: Abdomen is soft.  ?   Tenderness: There is no abdominal tenderness.  ?Musculoskeletal:  ?   Right lower leg: No swelling.  ?   Left lower leg: No swelling.  ?Skin: ?   General: Skin is warm.  ?   Findings: No rash.  ?Neurological:  ?   Mental Status: She is lethargic.  ?   Comments: Opens eyes briefly to sternal rub.  ?  ?Data Reviewed: ?No new data ? ?Disposition: ?Status is: Inpatient ?Remains inpatient appropriate because: Being treated for end-of-life care.  Awaiting hospice home bed. ? ?Planned Discharge Destination: Hospice facility ? ? ?Author: ?Loletha Grayer, MD ?11/04/2021 11:50 AM ? ?For on call review [email or web address].  ?

## 2021-11-05 DIAGNOSIS — G9389 Other specified disorders of brain: Secondary | ICD-10-CM | POA: Diagnosis not present

## 2021-11-05 DIAGNOSIS — G309 Alzheimer's disease, unspecified: Secondary | ICD-10-CM | POA: Diagnosis not present

## 2021-11-05 DIAGNOSIS — Z515 Encounter for palliative care: Secondary | ICD-10-CM | POA: Diagnosis not present

## 2021-11-05 DIAGNOSIS — R451 Restlessness and agitation: Secondary | ICD-10-CM | POA: Diagnosis not present

## 2021-11-05 NOTE — Progress Notes (Signed)
?  Progress Note ? ? ?Patient: Marie [name] [id number]:[id number] DOB: 10-31-1950 DOA: 09/20/2021     45 ?DOS: the patient was seen and examined on 11/05/2021 ?  ?Assessment and Plan: ?* End of life care ?Patient receiving comfort care measures and awaiting hospice home bed.  Case discussed with transitional care team on rounds today.  As per nursing staff not eating the last couple days. ? 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 ?Cerebral mass ?CT scan of the head shows enlarging hyperdense mass localizing to the left temporal parietal cortex measuring 3.1 cm with surrounding vasogenic edema and mass effect and midline right word shift. ? ?Alzheimer's dementia with behavioral disturbance (Marie [name]) ?Haldol as needed for agitation and delirium precautions as above ? ? ?Diabetes mellitus without complication (Marie [name]) ?No medications for this at this [name] ? ?CAD (coronary artery disease) ?No further treatment ? ?Essential hypertension ?Hold off on blood pressure medications with comfort care measures ? ? ? ? ? ? ?  ? ?Subjective: Patient resisted opening her eyes when I was trying to open them.  Did not respond to me. ? ?Physical Exam: ?Vitals:  ? 10/31/21 0738 11/03/21 0826 11/04/21 0757 11/05/21 0749  ?BP: (!) 143/83 (!) 127/91 (!) 109/58 103/64  ?Pulse: 78 85 (!) 104 (!) 101  ?Resp: '16 16 15 15  '$ ?Temp: 98.9 ?F (37.2 ?C) 98.5 ?F (36.9 ?C) 99 ?F (37.2 ?C) 98.5 ?F (36.9 ?C)  ?TempSrc:   Oral Oral  ?SpO2: 98% 97% 96% 99%  ?Weight:      ?Height:      ? ?Physical Exam ?HENT:  ?   Head: Normocephalic.  ?   Mouth/Throat:  ?   Comments: Unable to look into patient's mouth today ?Eyes:  ?   General: Lids are normal.  ?   Conjunctiva/sclera: Conjunctivae normal.  ?Cardiovascular:  ?   Rate and Rhythm: Normal rate and regular rhythm.  ?   Heart sounds: Normal heart sounds, S1 normal  and S2 normal.  ?Pulmonary:  ?   Breath sounds: No decreased breath sounds, wheezing, rhonchi or rales.  ?Abdominal:  ?   Palpations: Abdomen is soft.  ?   Tenderness: There is no abdominal tenderness.  ?Musculoskeletal:  ?   Right lower leg: No swelling.  ?   Left lower leg: No swelling.  ?Skin: ?   General: Skin is warm.  ?   Findings: No rash.  ?Neurological:  ?   Comments: Less responsive today.  ?  ?Data Reviewed: ?No new labs ? ?Family Communication: Spoke with husband on the phone ? ?Disposition: ?Status is: Inpatient ?Remains inpatient appropriate because: Receiving comfort care measures until hospice home opens up ? ?Planned Discharge Destination: Hospice facility ? ? ?Author: ?Loletha Grayer, MD ?11/05/2021 12:42 PM ? ?For on call review [email or web address].  ?

## 2021-11-05 NOTE — TOC Progression Note (Signed)
Transition of Care (TOC) - Progression Note  ? ? ?Patient Details  ?Name: Marie [name] ?MRN: [id number] ?Date of Birth: 10-Sep-1950 ? ?Transition of Care (TOC) CM/SW Contact  ?Shelbie Hutching, RN ?Phone Number: ?11/05/2021, 3:47 PM ? ?Clinical Narrative:    ?Patient was 3rd on the [facility] home wait list yesterday.  RNCM called to check today and left a message for return call on status. ? ? ?  ?  ? ?Expected Discharge Plan and Services ?  ?  ?  ?  ?  ?                ?  ?  ?  ?  ?  ?  ?  ?  ?  ?  ? ? ?Social Determinants of Health (SDOH) Interventions ?  ? ?Readmission Risk Interventions ?   ? View : No data to display.  ?  ?  ?  ? ? ?

## 2021-11-06 DIAGNOSIS — Z515 Encounter for palliative care: Secondary | ICD-10-CM | POA: Diagnosis not present

## 2021-11-06 DIAGNOSIS — G9389 Other specified disorders of brain: Secondary | ICD-10-CM | POA: Diagnosis not present

## 2021-11-06 DIAGNOSIS — R451 Restlessness and agitation: Secondary | ICD-10-CM | POA: Diagnosis not present

## 2021-11-06 DIAGNOSIS — G309 Alzheimer's disease, unspecified: Secondary | ICD-10-CM | POA: Diagnosis not present

## 2021-11-06 NOTE — Progress Notes (Signed)
?  Progress Note ? ? ?Patient: Marie [name] TLX:[id number] DOB: 1951-01-09 DOA: 09/20/2021     46 ?DOS: the patient was seen and examined on 11/06/2021 ?  ?Assessment and Plan: ?* End of life care ?Patient receiving comfort care measures and awaiting hospice home bed.  The last few days prior to this morning she was basically unresponsive but today was eating breakfast when I came in. ? 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 ?Cerebral mass ?CT scan of the head shows enlarging hyperdense mass localizing to the left temporal parietal cortex measuring 3.1 cm with surrounding vasogenic edema and mass effect and midline right word shift. ? ?Alzheimer's dementia with behavioral disturbance ([place]) ?Haldol as needed for agitation and delirium precautions as above ? ? ?Diabetes mellitus without complication ([place]) ?No medications for this at this point ? ?CAD (coronary artery disease) ?No further treatment ? ?Essential hypertension ?Hold off on blood pressure medications with comfort care measures ? ? ? ? ? ? ?  ? ?Subjective: I saw patient today and she was eating breakfast with one of the aides.  I was very surprised since the patient was basically unresponsive for the last few days. ? ?Physical Exam: ?Vitals:  ? 11/04/21 0757 11/05/21 0749 11/05/21 1629 11/06/21 1047  ?BP: (!) 109/58 103/64 111/72 (!) 112/96  ?Pulse: (!) 104 (!) 101 (!) 109 (!) 105  ?Resp: '15 15 17 16  '$ ?Temp: 99 ?F (37.2 ?C) 98.5 ?F (36.9 ?C) 97.8 ?F (36.6 ?C) 97.6 ?F (36.4 ?C)  ?TempSrc: Oral Oral Oral   ?SpO2: 96% 99% 99% 97%  ?Weight:      ?Height:      ? ?Physical Exam ?HENT:  ?   Head: Normocephalic.  ?   Mouth/Throat:  ?   Pharynx: No oropharyngeal exudate.  ?Eyes:  ?   General: Lids are normal.  ?   Conjunctiva/sclera: Conjunctivae normal.  ?Cardiovascular:  ?   Rate and Rhythm: Normal rate  and regular rhythm.  ?   Heart sounds: Normal heart sounds, S1 normal and S2 normal.  ?Pulmonary:  ?   Breath sounds: No decreased breath sounds, wheezing, rhonchi or rales.  ?Abdominal:  ?   Palpations: Abdomen is soft.  ?   Tenderness: There is no abdominal tenderness.  ?Musculoskeletal:  ?   Right lower leg: No swelling.  ?   Left lower leg: No swelling.  ?Skin: ?   General: Skin is warm.  ?   Findings: No rash.  ?Neurological:  ?   Mental Status: She is alert.  ?   Comments: Was eating breakfast when I came in the room.  He was actually able to say a few words  ?  ?Data Reviewed: ?No new labs. ? ?Family Communication: Spoke with husband on the phone ? ?Disposition: ?Status is: Inpatient ?Remains inpatient appropriate because: Awaiting hospice home bed.  Patient more alert this morning than she was the past few days. ? ?Planned Discharge Destination: Hospice home ? ? ?Author: ?Loletha Grayer, MD ?11/06/2021 3:09 PM ? ?For on call review [email or web address]  ?

## 2021-11-07 DIAGNOSIS — G9389 Other specified disorders of brain: Secondary | ICD-10-CM | POA: Diagnosis not present

## 2021-11-07 DIAGNOSIS — Z515 Encounter for palliative care: Secondary | ICD-10-CM | POA: Diagnosis not present

## 2021-11-07 DIAGNOSIS — G309 Alzheimer's disease, unspecified: Secondary | ICD-10-CM | POA: Diagnosis not present

## 2021-11-07 DIAGNOSIS — R451 Restlessness and agitation: Secondary | ICD-10-CM | POA: Diagnosis not present

## 2021-11-07 NOTE — Progress Notes (Signed)
?  Progress Note ? ? ?Patient: Marie [name] [id number]:[id number] DOB: 16-Nov-1950 DOA: 09/20/2021     47 ?DOS: the patient was seen and examined on 11/07/2021 ?  ? ?Assessment and Plan: ?* End of life care ?Patient receiving comfort care measures and awaiting hospice home bed.  More lethargic this morning when I saw her. ? ?Agitation ?Agitation has resolved ? ?Cerebral mass ?CT scan of the head shows enlarging hyperdense mass localizing to the left temporal parietal cortex measuring 3.1 cm with surrounding vasogenic edema and mass effect and midline right word shift. ? ?Alzheimer's dementia with behavioral disturbance ([place]) ?Haldol as needed for agitation and delirium precautions as above ? ? ?Diabetes mellitus without complication ([place]) ?No medications for this at this point ? ?CAD (coronary artery disease) ?No further treatment ? ?Essential hypertension ?Hold off on blood pressure medications with comfort care measures ? ? ? ? ? ? ?  ? ?Subjective: Patient eyes and looks around when stimulated.  Did not speak with me today.  Receiving treatment for end-of-life care ? ?Physical Exam: ?Vitals:  ? 11/05/21 0749 11/05/21 1629 11/06/21 1047 11/07/21 0756  ?BP: 103/64 111/72 (!) 112/96 (!) 148/85  ?Pulse: (!) 101 (!) 109 (!) 105 94  ?Resp: '15 17 16 16  '$ ?Temp: 98.5 ?F (36.9 ?C) 97.8 ?F (36.6 ?C) 97.6 ?F (36.4 ?C) 98 ?F (36.7 ?C)  ?TempSrc: Oral Oral    ?SpO2: 99% 99% 97% 99%  ?Weight:      ?Height:      ? ?Physical Exam ?HENT:  ?   Head: Normocephalic.  ?Eyes:  ?   General: Lids are normal.  ?   Conjunctiva/sclera: Conjunctivae normal.  ?Cardiovascular:  ?   Rate and Rhythm: Normal rate and regular rhythm.  ?   Heart sounds: Normal heart sounds, S1 normal and S2 normal.  ?Pulmonary:  ?   Breath sounds: No decreased breath sounds, wheezing, rhonchi or rales.  ?Abdominal:  ?   Palpations: Abdomen is soft.  ?   Tenderness: There is no abdominal tenderness.  ?Musculoskeletal:  ?   Right lower leg: No swelling.  ?   Left lower leg:  No swelling.  ?Skin: ?   General: Skin is warm.  ?   Findings: No rash.  ?Neurological:  ?   Mental Status: She is lethargic.  ?  ?Data Reviewed: ?No new data ? ?Family Communication: Spoke with husband yesterday ? ?Disposition: ?Status is: Inpatient ?Remains inpatient appropriate because: Awaiting hospice home bed ? ?Planned Discharge Destination: Hospice home ? ? ?Author: ?Loletha Grayer, MD ?11/07/2021 1:27 PM ? ?For on call review [email or web address].  ?

## 2021-11-08 DIAGNOSIS — G309 Alzheimer's disease, unspecified: Secondary | ICD-10-CM | POA: Diagnosis not present

## 2021-11-08 DIAGNOSIS — Z515 Encounter for palliative care: Secondary | ICD-10-CM | POA: Diagnosis not present

## 2021-11-08 DIAGNOSIS — G9389 Other specified disorders of brain: Secondary | ICD-10-CM | POA: Diagnosis not present

## 2021-11-08 DIAGNOSIS — R451 Restlessness and agitation: Secondary | ICD-10-CM | POA: Diagnosis not present

## 2021-11-08 NOTE — Care Management Important Message (Signed)
Important Message ? ?Patient Details  ?Name: Marie [name] ?MRN: [id number] ?Date of Birth: January 26, 1951 ? ? ?Medicare Important Message Given:  Other (see comment) ? ?Awaiting transfer to residential hospice pending bed availability.   Medicare IM withheld at this time. ? ? ?Dannette Barbara ?11/08/2021, 8:00 AM ?

## 2021-11-08 NOTE — Progress Notes (Signed)
?  Progress Note ? ? ?Patient: Marie [name] ZOX:[id number] DOB: [birth year] DOA: 09/20/2021     48 ?DOS: the patient was seen and examined on 11/08/2021 ?  ? ?Assessment and Plan: ?* End of life care ?Patient receiving comfort care measures and awaiting hospice home bed.  With sternal rub she opens up her eyes but did not talk with me. ? ?Agitation ?Agitation has resolved ? ? ?Cerebral mass ?CT scan of the head shows enlarging hyperdense mass localizing to the left temporal parietal cortex measuring 3.1 cm with surrounding vasogenic edema and mass effect and midline right word shift. ? ?Alzheimer's dementia with behavioral disturbance ([place]) ?Haldol as needed for agitation and delirium precautions as above ? ? ?Diabetes mellitus without complication ([place]) ?No medications for this at this point ? ?CAD (coronary artery disease) ?No further treatment ? ?Essential hypertension ?Hold off on blood pressure medications with comfort care measures ? ? ? ? ? ?  ? ?Subjective: Patient opens eyes with sternal rub but does not talk to me today.  Seen before breakfast.  Awaiting hospice home bed ? ?Physical Exam: ?Vitals:  ? 11/05/21 1629 11/06/21 1047 11/07/21 0756 11/08/21 0755  ?BP: 111/72 (!) 112/96 (!) 148/85 133/85  ?Pulse: (!) 109 (!) 105 94 (!) 101  ?Resp: '17 16 16 16  '$ ?Temp: 97.8 ?F (36.6 ?C) 97.6 ?F (36.4 ?C) 98 ?F (36.7 ?C) 97.8 ?F (36.6 ?C)  ?TempSrc: Oral     ?SpO2: 99% 97% 99% 100%  ?Weight:      ?Height:      ? ?Physical Exam ?HENT:  ?   Head: Normocephalic.  ?Eyes:  ?   General: Lids are normal.  ?   Conjunctiva/sclera: Conjunctivae normal.  ?Cardiovascular:  ?   Rate and Rhythm: Normal rate and regular rhythm.  ?   Heart sounds: Normal heart sounds, S1 normal and S2 normal.  ?Pulmonary:  ?   Breath sounds: No decreased breath sounds, wheezing, rhonchi or rales.  ?Abdominal:  ?   Palpations: Abdomen is soft.  ?   Tenderness: There is no abdominal tenderness.  ?Musculoskeletal:  ?   Right lower leg: No swelling.  ?    Left lower leg: No swelling.  ?Skin: ?   General: Skin is warm.  ?   Findings: No rash.  ?Neurological:  ?   Mental Status: She is lethargic.  ?  ?Data Reviewed: ?No new data ? ?Disposition: ?Status is: Inpatient ?Remains inpatient appropriate because: Awaiting hospice home bed ? ?Planned Discharge Destination: Hospice facility when bed available ? ? ?Author: ?Loletha Grayer, MD ?11/08/2021 3:30 PM ? ?For on call review [email or web address].  ?

## 2021-11-08 NOTE — TOC Progression Note (Signed)
Transition of Care (TOC) - Progression Note  ? ? ?Patient Details  ?Name: Marie [name] ?MRN: [id number] ?Date of Birth: Jul 11, 1950 ? ?Transition of Care (TOC) CM/SW Contact  ?Pete Pelt, RN ?Phone Number: ?11/08/2021, 10:41 AM ? ?Clinical Narrative:   Patient is still in the top 3 for a bed, [facility] would not give a place on this list.  They stated they anticipate getting a bed shortly.  Care team aware. ? ? ? ?  ?  ? ?Expected Discharge Plan and Services ?  ?  ?  ?  ?  ?                ?  ?  ?  ?  ?  ?  ?  ?  ?  ?  ? ? ?Social Determinants of Health (SDOH) Interventions ?  ? ?Readmission Risk Interventions ?   ? View : No data to display.  ?  ?  ?  ? ? ?

## 2021-11-09 DIAGNOSIS — Z515 Encounter for palliative care: Secondary | ICD-10-CM | POA: Diagnosis not present

## 2021-11-09 DIAGNOSIS — G309 Alzheimer's disease, unspecified: Secondary | ICD-10-CM | POA: Diagnosis not present

## 2021-11-09 DIAGNOSIS — R451 Restlessness and agitation: Secondary | ICD-10-CM | POA: Diagnosis not present

## 2021-11-09 DIAGNOSIS — G9389 Other specified disorders of brain: Secondary | ICD-10-CM | POA: Diagnosis not present
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
## 2021-11-10 DIAGNOSIS — Z515 Encounter for palliative care: Secondary | ICD-10-CM | POA: Diagnosis not present

## 2021-11-10 DIAGNOSIS — G9389 Other specified disorders of brain: Secondary | ICD-10-CM | POA: Diagnosis not present

## 2021-11-10 DIAGNOSIS — G309 Alzheimer's disease, unspecified: Secondary | ICD-10-CM | POA: Diagnosis not present

## 2021-11-10 DIAGNOSIS — F02818 Dementia in other diseases classified elsewhere, unspecified severity, with other behavioral disturbance: Secondary | ICD-10-CM | POA: Diagnosis not present

## 2021-11-10 NOTE — TOC Progression Note (Signed)
Transition of Care (TOC) - Progression Note  ? ? ?Patient Details  ?Name: Marie [name] ?MRN: [id number] ?Date of Birth: 05/14/1951 ? ?Transition of Care (TOC) CM/SW Contact  ?Candie Chroman, LCSW ?Phone Number: ?11/10/2021, 11:43 AM ? ?Clinical Narrative:   No hospice bed today. ? ?Expected Discharge Plan and Services ?  ?  ?  ?  ?  ?                ?  ?  ?  ?  ?  ?  ?  ?  ?  ?  ? ? ?Social Determinants of Health (SDOH) Interventions ?  ? ?Readmission Risk Interventions ?   ? View : No data to display.  ?  ?  ?  ? ? ?
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
## 2021-11-11 NOTE — Progress Notes (Signed)
Pt too lethargic to take po medication tonight.
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
## 2021-11-12 NOTE — TOC Progression Note (Signed)
Transition of Care [facility]) - Progression Note    Patient Details  Name: Marie [name] MRN: [id number] Date of Birth: 30-Jan-1951  [facility]) CM/SW Contact  Shelbie Hutching, RN Phone Number: 11/12/2021, 1:55 PM  Clinical Narrative:    Patient is next in line for a bed at [facility].  They report having a couple of imminent patients.         Expected Discharge Plan and Services                                                 Social Determinants of Health (SDOH) Interventions    Readmission Risk Interventions     View : No data to display.

## 2021-11-12 NOTE — Care Management Important Message (Signed)
Important Message  Patient Details  Name: Marie [name] MRN: [id number] Date of Birth: 09/25/1950   Medicare Important Message Given:  Other (see comment)  Patient is on comfort care and awaiting bed at [facility]. Out of respect for the patient and family no Important Message from [facility] given.   Juliann Pulse A Dereon Corkery 11/12/2021, 8:32 AM

## 2021-11-13 NOTE — TOC Progression Note (Signed)
Transition of Care [facility]) - Progression Note    Patient Details  Name: CASSADEE VANZANDT MRN: [id number] Date of Birth: 1951/04/23  Transition of Care [facility]) CM/SW Contact  Zigmund Daniel Dorian Pod, RN Phone Number:[phone] 11/13/2021, 10:47 AM  Clinical Narrative:    RN spoke with Manuela Schwartz from [place] [phone] who has verified no beds available today however pt remains on the list for placement.  TOC will continue to follow up accordingly on beds at this facility.       Expected Discharge Plan and Services                                                 Social Determinants of Health (SDOH) Interventions    Readmission Risk Interventions     View : No data to display.
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
## 2021-11-15 NOTE — TOC Progression Note (Signed)
Transition of Care [facility]) - Progression Note    Patient Details  Name: Marie [name] MRN: [id number] Date of Birth: [birth year]  Transition [facility] Care [facility] CM/SW [place], RN Phone Number: 11/15/2021, 3:46 PM  Clinical Narrative: RNCM spoke with [facility] re: admission status.  Per Cassandra, they only have 8 beds in the facility, thus they have limited accommodations at this time.    Vito Backers is looking into patient qualifications and placement and will get back to me to see if they will continue to deem patient appropriate for facility.               Expected Discharge Plan and Services                                                 Social Determinants of Health (SDOH) Interventions    Readmission Risk Interventions     View : No data to display.
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
## 2021-11-16 ENCOUNTER — Inpatient Hospital Stay: Payer: Medicare PPO

## 2021-11-16 DIAGNOSIS — Z515 Encounter for palliative care: Secondary | ICD-10-CM | POA: Diagnosis not present

## 2021-11-16 DIAGNOSIS — R451 Restlessness and agitation: Secondary | ICD-10-CM | POA: Diagnosis not present

## 2021-11-16 DIAGNOSIS — G9389 Other specified disorders of brain: Secondary | ICD-10-CM | POA: Diagnosis not present

## 2021-11-16 DIAGNOSIS — G309 Alzheimer's disease, unspecified: Secondary | ICD-10-CM | POA: Diagnosis not present
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
## 2021-11-16 NOTE — Progress Notes (Signed)
Manufacturing engineer [facility]) Hospital Liaison Note  Received request from [facility] [place], RN for family interest in in-patient unit ([place]). Visited patient at bedside and spoke with Legal Guardian-- Florene Route: [phone] to confirm interest and explain services. Per Vernie Shanks, they requested that referral is sent to [facility]) as family resides in [place], [place].  Approval for [employer] is determined by [facility] MD. Once [facility] MD has determined [facility] eligibility, [place] will update hospital staff and family. Eligibility is pending.  Please do not hesitate to call with any hospice related questions.    Thank you for the opportunity to participate in this patient's care.  Daphene Calamity, MSW [facility] Liaison  [phone]

## 2021-11-16 NOTE — TOC Progression Note (Signed)
Transition of Care [facility]) - Progression Note    Patient Details  Name: Marie [name] MRN: [id number] Date of Birth: 24-Aug-1950  Transition of [facility]) CM/SW [place], RN Phone Number: 11/16/2021, 12:45 PM  Clinical Narrative:   Left message for Janett Billow at [facility]         Expected Discharge Plan and Services                                                 Social Determinants of Health (SDOH) Interventions    Readmission Risk Interventions     View : No data to display.

## 2021-11-17 DIAGNOSIS — Z515 Encounter for palliative care: Secondary | ICD-10-CM | POA: Diagnosis not present

## 2021-11-17 NOTE — Progress Notes (Signed)
Manufacturing engineer [facility]) Hospital Liaison Note  Approval for [facility] is determined by [facility] MD. Once [facility] MD has determined [facility] eligibility, [place] will update hospital staff and family. Patient is not eligible for [facility] at this time. [place] signing off at this time, but are available for assistance if needed.   Please do not hesitate to call with any hospice related questions.    Thank you for the opportunity to participate in this patient's care.   Daphene Calamity, MSW [facility] Liaison  [phone]

## 2021-11-17 NOTE — TOC Progression Note (Signed)
Transition of Care [facility]) - Progression Note    Patient Details  Name: Marie [name] MRN: [id number] Date of Birth: October 20, 1950  Transition of Care [facility]) CM/SW [place], RN Phone Number: 11/17/2021, 3:30 PM  Clinical Narrative:   Hospitalist contacted [facility] hospice yesterday, still no beds.  [place] assessed patient, TOC was notified today that patient does not qualify for [facility] placement.  TOC waiting to hear from [facility] for placement.         Expected Discharge Plan and Services                                                 Social Determinants of Health (SDOH) Interventions    Readmission Risk Interventions     View : No data to display.
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
## 2021-11-18 DIAGNOSIS — R451 Restlessness and agitation: Secondary | ICD-10-CM | POA: Diagnosis not present

## 2021-11-18 DIAGNOSIS — E119 Type 2 diabetes mellitus without complications: Secondary | ICD-10-CM | POA: Diagnosis not present

## 2021-11-18 DIAGNOSIS — Z515 Encounter for palliative care: Secondary | ICD-10-CM | POA: Diagnosis not present

## 2021-11-18 DIAGNOSIS — G9389 Other specified disorders of brain: Secondary | ICD-10-CM | POA: Diagnosis not present

## 2021-11-18 NOTE — Assessment & Plan Note (Signed)
CT scan of the head shows enlarging hyperdense mass localizing to the left temporal parietal cortex measuring 3.1 cm with surrounding vasogenic edema and mass effect and midline right shift.

## 2021-11-18 NOTE — Assessment & Plan Note (Signed)
Comfort care measures 

## 2021-11-18 NOTE — Assessment & Plan Note (Addendum)
Does have some involuntary scratching with her left hand on her right upper chest. Can place mitten on the left hand.

## 2021-11-18 NOTE — Assessment & Plan Note (Addendum)
On Seroquel at night

## 2021-11-18 NOTE — Assessment & Plan Note (Addendum)
Patient receiving comfort care measures and will go to hospice home on 11/23/2021.  Patient is alert when eating.

## 2021-11-18 NOTE — Assessment & Plan Note (Addendum)
Comfort care measures 
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
## 2021-11-18 NOTE — Assessment & Plan Note (Signed)
Comfort care  °

## 2021-11-19 DIAGNOSIS — Z515 Encounter for palliative care: Secondary | ICD-10-CM | POA: Diagnosis not present

## 2021-11-19 DIAGNOSIS — R451 Restlessness and agitation: Secondary | ICD-10-CM | POA: Diagnosis not present

## 2021-11-19 DIAGNOSIS — G309 Alzheimer's disease, unspecified: Secondary | ICD-10-CM | POA: Diagnosis not present

## 2021-11-19 DIAGNOSIS — G9389 Other specified disorders of brain: Secondary | ICD-10-CM | POA: Diagnosis not present
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
## 2021-11-19 NOTE — TOC Progression Note (Signed)
Transition of Care [facility]) - Progression Note    Patient Details  Name: Marie [name] MRN: [id number] Date of Birth: 11-11-1950  [facility]) CM/SW [place], RN Phone Number: 11/19/2021, 1:03 PM  Clinical Narrative:   Damaris Schooner to [facility] no beds today.  Keka with intake states that they anticipate having a bed on Monday or Tuesday for this patient.  She states to check back on Sunday to be sure.[name] supervisor aware.  TOC to [phone]         Expected Discharge Plan and Services                                                 Social Determinants of Health (SDOH) Interventions    Readmission Risk Interventions     View : No data to display.

## 2021-11-20 ENCOUNTER — Inpatient Hospital Stay: Payer: Medicare PPO

## 2021-11-20 DIAGNOSIS — R451 Restlessness and agitation: Secondary | ICD-10-CM | POA: Diagnosis not present

## 2021-11-20 DIAGNOSIS — Z515 Encounter for palliative care: Secondary | ICD-10-CM | POA: Diagnosis not present

## 2021-11-20 DIAGNOSIS — R000 Tachycardia, unspecified: Secondary | ICD-10-CM

## 2021-11-20 DIAGNOSIS — G9389 Other specified disorders of brain: Secondary | ICD-10-CM | POA: Diagnosis not present

## 2021-11-20 DIAGNOSIS — G309 Alzheimer's disease, unspecified: Secondary | ICD-10-CM | POA: Diagnosis not present

## 2021-11-20 MED ORDER — QUETIAPINE FUMARATE 25 MG PO TABS
50.0000 mg | ORAL_TABLET | Freq: Every day | ORAL | Status: DC
  Administered 2021-11-20 – 2021-11-22 (×2): 50 mg via ORAL
  Filled 2021-11-20: qty 2
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
## 2021-11-21 DIAGNOSIS — R451 Restlessness and agitation: Secondary | ICD-10-CM | POA: Diagnosis not present

## 2021-11-21 DIAGNOSIS — G309 Alzheimer's disease, unspecified: Secondary | ICD-10-CM | POA: Diagnosis not present

## 2021-11-21 DIAGNOSIS — G9389 Other specified disorders of brain: Secondary | ICD-10-CM | POA: Diagnosis not present

## 2021-11-21 DIAGNOSIS — L299 Pruritus, unspecified: Secondary | ICD-10-CM

## 2021-11-21 DIAGNOSIS — Z515 Encounter for palliative care: Secondary | ICD-10-CM | POA: Diagnosis not present

## 2021-11-21 MED ORDER — LORATADINE 10 MG PO TABS
10.0000 mg | ORAL_TABLET | Freq: Every day | ORAL | Status: DC
  Administered 2021-11-21: 10 mg via ORAL
  Filled 2021-11-21 (×2): qty 1

## 2021-11-21 MED ORDER — HYDROCORTISONE 0.5 % EX CREA
TOPICAL_CREAM | Freq: Two times a day (BID) | CUTANEOUS | Status: DC | PRN
  Filled 2021-11-21: qty 28.35

## 2021-11-21 NOTE — TOC Progression Note (Signed)
Transition of Care [facility]) - Progression Note    Patient Details  Name: MEMPHIS CRESWELL MRN: [id number] Date of Birth: Feb 03, 1951  Transition of Care [facility]) CM/SW [place], LCSW Phone Number: 11/21/2021, 9:17 AM  Clinical Narrative:   CSW called [facility] to confirm they can take patient tomorrow or Tuesday. Spoke with [employer]. She stated she cannot confirm that and TOC will need to call back tomorrow and ask for Intake [employer] or [employer].          Expected Discharge Plan and Services                                                 Social Determinants of Health (SDOH) Interventions    Readmission Risk Interventions     View : No data to display.

## 2021-11-21 NOTE — Progress Notes (Signed)
  Progress Note   Patient: Marie [name] OIN:[id number] DOB: 09-15-1950 DOA: 09/20/2021     61 DOS: the patient was seen and examined on 11/21/2021     Assessment and Plan: * End of life care Patient receiving comfort care measures and awaiting hospice home bed (potentially for Monday or Tuesday).  Patient kept her eyes closed the entire time I was in the room.  Agitation Currently no agitation.  Cerebral mass CT scan of the head shows enlarging hyperdense mass localizing to the left temporal parietal cortex measuring 3.1 cm with surrounding vasogenic edema and mass effect and midline right shift.  Alzheimer's dementia with behavioral disturbance ([place]) Comfort care   Itching As needed Atarax.  We will start Claritin on a daily basis.  Since we do not have Benadryl cream I will do low-dose hydrocortisone cream twice a day.  Tachycardia Patient on comfort care measures.  Diabetes mellitus without complication ([place]) Comfort care measures  CAD (coronary artery disease) Comfort care measures  Essential hypertension Comfort care        Subjective: The patient kept her eyes closed during my hospital visit today.  Follow-up for comfort care measures.  Nursing staff noted some excoriations upper chest.  Physical Exam: Vitals:   11/17/21 0804 11/18/21 0835 11/19/21 0812 11/21/21 0413  BP: 113/69 115/83 101/71 126/68  Pulse: (!) 125 (!) 126 (!) 116 (!) 136  Resp: '14 16 18 19  '$ Temp: 98.8 F (37.1 C) 99 F (37.2 C) 99.3 F (37.4 C) 99.2 F (37.3 C)  TempSrc:  Oral Oral Axillary  SpO2: 96% 100% 97% 97%  Weight:      Height:       Physical Exam HENT:     Head: Normocephalic.  Eyes:     General: Lids are normal.     Conjunctiva/sclera: Conjunctivae normal.  Cardiovascular:     Rate and Rhythm: Regular rhythm. Tachycardia present.     Heart sounds: Normal heart sounds, S1 normal and S2 normal.  Pulmonary:     Breath sounds: No decreased breath sounds, wheezing,  rhonchi or rales.  Abdominal:     Palpations: Abdomen is soft.     Tenderness: There is no abdominal tenderness.  Musculoskeletal:     Right hand: No swelling.     Right lower leg: No swelling.     Left lower leg: No swelling.     Right foot: Swelling present.  Skin:    General: Skin is warm.     Comments: Secondary excoriations upper chest.   Neurological:     Mental Status: She is lethargic.     Comments: Kept eyes closed the entire time I was in the room.    Data Reviewed: Comfort care measures  Family Communication: Updated husband yesterday  Disposition: Status is: Inpatient Remains inpatient appropriate because: Awaiting hospice bed at [facility] Discharge Destination: Hospice facility   Author: Loletha Grayer, MD 11/21/2021 1:22 PM  For on call review [email or web address].

## 2021-11-21 NOTE — Assessment & Plan Note (Addendum)
As needed Atarax.  We will start Claritin on a daily basis.  Low-dose hydrocortisone cream.

## 2021-11-21 NOTE — Assessment & Plan Note (Signed)
Patient on comfort care measures.

## 2021-11-22 DIAGNOSIS — R451 Restlessness and agitation: Secondary | ICD-10-CM | POA: Diagnosis not present

## 2021-11-22 DIAGNOSIS — G309 Alzheimer's disease, unspecified: Secondary | ICD-10-CM | POA: Diagnosis not present

## 2021-11-22 DIAGNOSIS — G9389 Other specified disorders of brain: Secondary | ICD-10-CM | POA: Diagnosis not present

## 2021-11-22 DIAGNOSIS — Z515 Encounter for palliative care: Secondary | ICD-10-CM | POA: Diagnosis not present

## 2021-11-22 LAB — SARS CORONAVIRUS 2 BY RT PCR: SARS Coronavirus 2 by RT PCR: NEGATIVE

## 2021-11-22 MED ORDER — QUETIAPINE FUMARATE 50 MG PO TABS
50.0000 mg | ORAL_TABLET | Freq: Every day | ORAL | 0 refills | Status: DC
Start: 2021-11-22 — End: 2021-11-23

## 2021-11-22 MED ORDER — LORAZEPAM 2 MG/ML IJ SOLN: 0.5000 mg | Freq: Three times a day (TID) | INTRAMUSCULAR | 0 refills | Status: DC | PRN

## 2021-11-22 MED ORDER — LORATADINE 10 MG PO TABS: 10.0000 mg | ORAL_TABLET | Freq: Every day | ORAL | 0 refills | Status: DC

## 2021-11-22 MED ORDER — HYDROXYZINE HCL 25 MG PO TABS: 25.0000 mg | ORAL_TABLET | Freq: Three times a day (TID) | ORAL | 0 refills | Status: DC | PRN

## 2021-11-22 MED ORDER — POLYVINYL ALCOHOL 1.4 % OP SOLN: 1.0000 [drp] | Freq: Four times a day (QID) | OPHTHALMIC | 0 refills | Status: DC | PRN

## 2021-11-22 MED ORDER — LORATADINE 10 MG PO TABS
10.0000 mg | ORAL_TABLET | Freq: Every day | ORAL | Status: DC
  Administered 2021-11-22: 10 mg via ORAL
  Filled 2021-11-22: qty 1

## 2021-11-22 MED ORDER — HYDROCORTISONE 0.5 % EX CREA: TOPICAL_CREAM | Freq: Two times a day (BID) | CUTANEOUS | 0 refills | Status: DC | PRN

## 2021-11-22 MED ORDER — BIOTENE DRY MOUTH MT LIQD
15.0000 mL | OROMUCOSAL | 0 refills | Status: DC | PRN
Start: 2021-11-22 — End: 2021-11-23

## 2021-11-22 MED ORDER — MORPHINE SULFATE (PF) 2 MG/ML IV SOLN: 0.5000 mg | INTRAVENOUS | 0 refills | Status: DC | PRN

## 2021-11-22 MED ORDER — GLYCOPYRROLATE 0.2 MG/ML IJ SOLN
0.2000 mg | INTRAMUSCULAR | 0 refills | Status: DC | PRN
Start: 2021-11-22 — End: 2021-11-23

## 2021-11-22 MED ORDER — ONDANSETRON HCL 4 MG/2ML IJ SOLN: 4.0000 mg | Freq: Four times a day (QID) | INTRAMUSCULAR | 0 refills | Status: DC | PRN

## 2021-11-22 NOTE — TOC Progression Note (Addendum)
Transition of Care [facility]) - Progression Note    Patient Details  Name: Marie [name] MRN: [id number] Date of Birth: 12/08/1950  Transition of Care [facility]) CM/SW [place], RN Phone Number: 11/22/2021, 12:41 PM  Clinical Narrative:   [facility] contacted [facility], as .  They do have a bed for patient today, however they will need to get in touch with legal guardian to sign papers.  Cassandra will contact TOC when ready  Addendum 1329:  As per Vito Backers, granddaughter will sign papers today.  However, Cassandra informed RNCM on second call that patient will need a negative COVID test.  Order placed, patient will be tested today.  Test must be within 24 hours of admission.  [facility] cannot transport patient today, but is able to transport patient to [facility] tomorrow.  Care team notified of transport tomorrow.          Expected Discharge Plan and Services                                                 Social Determinants of Health (SDOH) Interventions    Readmission Risk Interventions     View : No data to display.

## 2021-11-22 NOTE — Progress Notes (Signed)
  Progress Note   Patient: Marie [name] MBT:[id number] DOB: Mar 23, 1951 DOA: 09/20/2021     62 DOS: the patient was seen and examined on 11/22/2021     Assessment and Plan: * End of life care Patient receiving comfort care measures and will go to hospice home on 11/23/2021.  Patient is alert when eating.  Agitation Does have some involuntary scratching with her left hand on her right upper chest. Can place mitten on the left hand.  Cerebral mass CT scan of the head shows enlarging hyperdense mass localizing to the left temporal parietal cortex measuring 3.1 cm with surrounding vasogenic edema and mass effect and midline right shift.  Alzheimer's dementia with behavioral disturbance ([place]) On Seroquel at night   Itching As needed Atarax.  We will start Claritin on a daily basis.  Low-dose hydrocortisone cream.  Tachycardia Patient on comfort care measures.  Diabetes mellitus without complication ([place]) Comfort care measures  CAD (coronary artery disease) Comfort care measures  Essential hypertension Comfort care         Subjective: Patient had her eyes open while she was eating breakfast when I saw her this morning.  She is the most alert during times where she is eating.  Undergoing treatment for end-of-life care  Physical Exam: Vitals:   11/18/21 0835 11/19/21 0812 11/21/21 0413 11/22/21 0728  BP: 115/83 101/71 126/68 122/63  Pulse: (!) 126 (!) 116 (!) 136 93  Resp: '16 18 19 18  '$ Temp: 99 F (37.2 C) 99.3 F (37.4 C) 99.2 F (37.3 C) 98 F (36.7 C)  TempSrc: Oral Oral Axillary Oral  SpO2: 100% 97% 97% 99%  Weight:      Height:       Physical Exam HENT:     Head: Normocephalic.  Eyes:     General: Lids are normal.     Conjunctiva/sclera: Conjunctivae normal.  Cardiovascular:     Rate and Rhythm: Regular rhythm. Tachycardia present.     Heart sounds: Normal heart sounds, S1 normal and S2 normal.  Pulmonary:     Breath sounds: No decreased breath  sounds, wheezing, rhonchi or rales.  Abdominal:     Palpations: Abdomen is soft.     Tenderness: There is no abdominal tenderness.  Musculoskeletal:     Right lower leg: No swelling.     Left lower leg: No swelling.     Right foot: Swelling present.  Skin:    General: Skin is warm.     Comments: Secondary excoriations right upper chest.   Neurological:     Mental Status: She is lethargic.     Comments: Eyes open while eating.  Involuntary scratching her left upper chest with her left hand    Data Reviewed: COVID test negative today  Family Communication: Left message for husband  Disposition: Status is: Inpatient Remains inpatient appropriate because: Accepted to hospice home but unable to transport today.  Patient will be transferred to hospice home tomorrow Planned Discharge Destination: Hospice facility  Author: Loletha Grayer, MD 11/22/2021 3:01 PM  For on call review [email or web address].

## 2021-11-23 DIAGNOSIS — Z515 Encounter for palliative care: Secondary | ICD-10-CM | POA: Diagnosis not present

## 2021-11-23 DIAGNOSIS — G9389 Other specified disorders of brain: Secondary | ICD-10-CM | POA: Diagnosis not present

## 2021-11-23 DIAGNOSIS — G309 Alzheimer's disease, unspecified: Secondary | ICD-10-CM | POA: Diagnosis not present

## 2021-11-23 DIAGNOSIS — R451 Restlessness and agitation: Secondary | ICD-10-CM | POA: Diagnosis not present

## 2021-11-23 MED ORDER — LORATADINE 10 MG PO TABS: 10.0000 mg | ORAL_TABLET | Freq: Every day | ORAL | 0 refills | Status: DC

## 2021-11-23 MED ORDER — MORPHINE SULFATE (PF) 2 MG/ML IV SOLN: 0.5000 mg | INTRAVENOUS | 0 refills | Status: DC | PRN

## 2021-11-23 MED ORDER — GLYCOPYRROLATE 0.2 MG/ML IJ SOLN: 0.2000 mg | INTRAMUSCULAR | 0 refills | Status: DC | PRN

## 2021-11-23 MED ORDER — QUETIAPINE FUMARATE 50 MG PO TABS
50.0000 mg | ORAL_TABLET | Freq: Every day | ORAL | 0 refills | Status: DC
Start: 2021-11-23 — End: 2023-05-16

## 2021-11-23 MED ORDER — HYDROCORTISONE 0.5 % EX CREA: TOPICAL_CREAM | Freq: Two times a day (BID) | CUTANEOUS | 0 refills | Status: DC | PRN

## 2021-11-23 MED ORDER — POLYVINYL ALCOHOL 1.4 % OP SOLN: 1.0000 [drp] | Freq: Four times a day (QID) | OPHTHALMIC | 0 refills | Status: DC | PRN

## 2021-11-23 MED ORDER — ONDANSETRON HCL 4 MG/2ML IJ SOLN
4.0000 mg | Freq: Four times a day (QID) | INTRAMUSCULAR | 0 refills | Status: DC | PRN
Start: 2021-11-23 — End: 2023-05-16

## 2021-11-23 MED ORDER — BIOTENE DRY MOUTH MT LIQD: 15.0000 mL | OROMUCOSAL | 0 refills | Status: DC | PRN

## 2021-11-23 MED ORDER — HYDROXYZINE HCL 25 MG PO TABS
25.0000 mg | ORAL_TABLET | Freq: Three times a day (TID) | ORAL | 0 refills | Status: DC | PRN
Start: 2021-11-23 — End: 2023-05-16

## 2021-11-23 MED ORDER — LORAZEPAM 2 MG/ML IJ SOLN: 0.5000 mg | Freq: Three times a day (TID) | INTRAMUSCULAR | 0 refills | Status: DC | PRN

## 2021-11-23 NOTE — Care Management Important Message (Signed)
Important Message  Patient Details  Name: Marie [name] MRN: [id number] Date of Birth: March 03, 1951   Medicare Important Message Given:  Other (see comment)  Patient is on comfort care measures and discharging to [facility]. Out of respect for the patient and family no Important Message from [facility] given.  Juliann Pulse A Yacine Droz 11/23/2021, 7:28 AM
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
## 2022-01-25 DEATH — deceased

## 2022-01-27 ENCOUNTER — Ambulatory Visit: Payer: Medicare PPO | Admitting: Nurse Practitioner

## 2023-01-31 ENCOUNTER — Ambulatory Visit: Payer: Medicare PPO | Admitting: Nurse Practitioner
